# Patient Record
Sex: Male | Born: 1960 | Race: White | Hispanic: No | Marital: Married | Smoking: Former smoker
Health system: Southern US, Community
[De-identification: ages and names within clinical notes are randomized; demographics above are authoritative.]

## PROBLEM LIST (undated history)

## (undated) DIAGNOSIS — R011 Cardiac murmur, unspecified: Secondary | ICD-10-CM

## (undated) DIAGNOSIS — I4891 Unspecified atrial fibrillation: Secondary | ICD-10-CM

## (undated) DIAGNOSIS — I499 Cardiac arrhythmia, unspecified: Secondary | ICD-10-CM

## (undated) DIAGNOSIS — H269 Unspecified cataract: Secondary | ICD-10-CM

## (undated) DIAGNOSIS — H9190 Unspecified hearing loss, unspecified ear: Secondary | ICD-10-CM

## (undated) DIAGNOSIS — N400 Enlarged prostate without lower urinary tract symptoms: Secondary | ICD-10-CM

## (undated) DIAGNOSIS — M1990 Unspecified osteoarthritis, unspecified site: Secondary | ICD-10-CM

## (undated) DIAGNOSIS — T8859XA Other complications of anesthesia, initial encounter: Secondary | ICD-10-CM

## (undated) DIAGNOSIS — T4145XA Adverse effect of unspecified anesthetic, initial encounter: Secondary | ICD-10-CM

## (undated) DIAGNOSIS — E119 Type 2 diabetes mellitus without complications: Secondary | ICD-10-CM

## (undated) DIAGNOSIS — E785 Hyperlipidemia, unspecified: Secondary | ICD-10-CM

## (undated) DIAGNOSIS — E756 Lipid storage disorder, unspecified: Secondary | ICD-10-CM

## (undated) DIAGNOSIS — E8809 Other disorders of plasma-protein metabolism, not elsewhere classified: Secondary | ICD-10-CM

## (undated) DIAGNOSIS — Z974 Presence of external hearing-aid: Secondary | ICD-10-CM

## (undated) DIAGNOSIS — J45909 Unspecified asthma, uncomplicated: Secondary | ICD-10-CM

## (undated) DIAGNOSIS — Z5189 Encounter for other specified aftercare: Secondary | ICD-10-CM

## (undated) DIAGNOSIS — K219 Gastro-esophageal reflux disease without esophagitis: Secondary | ICD-10-CM

## (undated) DIAGNOSIS — Z87442 Personal history of urinary calculi: Secondary | ICD-10-CM

## (undated) DIAGNOSIS — I10 Essential (primary) hypertension: Secondary | ICD-10-CM

## (undated) DIAGNOSIS — G4730 Sleep apnea, unspecified: Secondary | ICD-10-CM

## (undated) DIAGNOSIS — D649 Anemia, unspecified: Secondary | ICD-10-CM

## (undated) DIAGNOSIS — G7100 Muscular dystrophy, unspecified: Secondary | ICD-10-CM

## (undated) DIAGNOSIS — T7840XA Allergy, unspecified, initial encounter: Secondary | ICD-10-CM

## (undated) DIAGNOSIS — R112 Nausea with vomiting, unspecified: Secondary | ICD-10-CM

## (undated) DIAGNOSIS — Z9889 Other specified postprocedural states: Secondary | ICD-10-CM

## (undated) HISTORY — DX: Unspecified cataract: H26.9

## (undated) HISTORY — DX: Other disorders of plasma-protein metabolism, not elsewhere classified: E88.09

## (undated) HISTORY — PX: CATARACT EXTRACTION: SUR2

## (undated) HISTORY — DX: Muscular dystrophy, unspecified: G71.00

## (undated) HISTORY — DX: Benign prostatic hyperplasia without lower urinary tract symptoms: N40.0

## (undated) HISTORY — PX: SHOULDER OPEN ROTATOR CUFF REPAIR: SHX2407

## (undated) HISTORY — DX: Encounter for other specified aftercare: Z51.89

## (undated) HISTORY — PX: FEMUR FRACTURE SURGERY: SHX633

## (undated) HISTORY — DX: Presence of external hearing-aid: Z97.4

## (undated) HISTORY — DX: Anemia, unspecified: D64.9

## (undated) HISTORY — DX: Unspecified asthma, uncomplicated: J45.909

## (undated) HISTORY — PX: KNEE SURGERY: SHX244

## (undated) HISTORY — DX: Cardiac arrhythmia, unspecified: I49.9

## (undated) HISTORY — DX: Gastro-esophageal reflux disease without esophagitis: K21.9

## (undated) HISTORY — PX: NISSEN FUNDOPLICATION: SHX2091

## (undated) HISTORY — PX: COLONOSCOPY: SHX174

## (undated) HISTORY — DX: Unspecified osteoarthritis, unspecified site: M19.90

## (undated) HISTORY — DX: Hyperlipidemia, unspecified: E78.5

## (undated) HISTORY — DX: Type 2 diabetes mellitus without complications: E11.9

## (undated) HISTORY — DX: Lipid storage disorder, unspecified: E75.6

## (undated) HISTORY — DX: Allergy, unspecified, initial encounter: T78.40XA

## (undated) HISTORY — PX: KIDNEY STONE SURGERY: SHX686

---
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
## 1999-01-01 ENCOUNTER — Ambulatory Visit (HOSPITAL_COMMUNITY): Admission: RE | Admit: 1999-01-01 | Discharge: 1999-01-01 | Payer: Self-pay | Admitting: Dentistry

## 1999-09-13 ENCOUNTER — Ambulatory Visit (HOSPITAL_COMMUNITY): Admission: RE | Admit: 1999-09-13 | Discharge: 1999-09-13 | Payer: Self-pay | Admitting: Cardiology

## 2002-03-18 ENCOUNTER — Ambulatory Visit (HOSPITAL_BASED_OUTPATIENT_CLINIC_OR_DEPARTMENT_OTHER): Admission: RE | Admit: 2002-03-18 | Discharge: 2002-03-19 | Payer: Self-pay | Admitting: *Deleted

## 2002-03-18 ENCOUNTER — Encounter (INDEPENDENT_AMBULATORY_CARE_PROVIDER_SITE_OTHER): Payer: Self-pay | Admitting: *Deleted

## 2002-08-10 ENCOUNTER — Ambulatory Visit: Admission: RE | Admit: 2002-08-10 | Discharge: 2002-08-10 | Payer: Self-pay

## 2002-08-11 ENCOUNTER — Encounter: Admission: RE | Admit: 2002-08-11 | Discharge: 2002-08-11 | Payer: Self-pay

## 2002-11-29 ENCOUNTER — Encounter: Admission: RE | Admit: 2002-11-29 | Discharge: 2002-11-29 | Payer: Self-pay

## 2002-12-16 ENCOUNTER — Encounter: Admission: RE | Admit: 2002-12-16 | Discharge: 2002-12-16 | Payer: Self-pay

## 2007-06-11 ENCOUNTER — Encounter: Admission: RE | Admit: 2007-06-11 | Discharge: 2007-06-11 | Payer: Self-pay

## 2008-02-13 ENCOUNTER — Emergency Department (HOSPITAL_COMMUNITY): Admission: EM | Admit: 2008-02-13 | Discharge: 2008-02-13 | Payer: Self-pay | Admitting: Emergency Medicine

## 2008-02-18 ENCOUNTER — Encounter: Admission: RE | Admit: 2008-02-18 | Discharge: 2008-02-18 | Payer: Self-pay | Admitting: Family Medicine

## 2008-09-15 ENCOUNTER — Ambulatory Visit: Payer: Self-pay | Admitting: Gastroenterology

## 2008-09-28 ENCOUNTER — Ambulatory Visit: Payer: Self-pay | Admitting: Gastroenterology

## 2009-03-03 ENCOUNTER — Ambulatory Visit (HOSPITAL_BASED_OUTPATIENT_CLINIC_OR_DEPARTMENT_OTHER): Admission: RE | Admit: 2009-03-03 | Discharge: 2009-03-03 | Payer: Self-pay | Admitting: Nurse Practitioner

## 2009-03-04 ENCOUNTER — Ambulatory Visit: Payer: Self-pay | Admitting: Internal Medicine

## 2009-03-30 ENCOUNTER — Observation Stay (HOSPITAL_COMMUNITY): Admission: EM | Admit: 2009-03-30 | Discharge: 2009-03-31 | Payer: Self-pay | Admitting: Emergency Medicine

## 2010-04-01 LAB — BASIC METABOLIC PANEL
BUN: 14 mg/dL (ref 6–23)
CO2: 25 mEq/L (ref 19–32)
Calcium: 8.7 mg/dL (ref 8.4–10.5)
Chloride: 107 mEq/L (ref 96–112)
Creatinine, Ser: 1.14 mg/dL (ref 0.4–1.5)
GFR calc Af Amer: >60 mL/min (ref >60)
GFR calc non Af Amer: >60 mL/min (ref >60)
Glucose, Bld: 90 mg/dL (ref 70–99)
Potassium: 3.5 mEq/L (ref 3.5–5.1)
Sodium: 138 mEq/L (ref 135–145)

## 2010-04-01 LAB — RAPID URINE DRUG SCREEN, HOSP PERFORMED
Amphetamines: NOT DETECTED
Barbiturates: NOT DETECTED
Benzodiazepines: NOT DETECTED
Cocaine: NOT DETECTED
Opiates: NOT DETECTED
Tetrahydrocannabinol: NOT DETECTED

## 2010-04-01 LAB — DIFFERENTIAL
Basophils Absolute: 0 10*3/uL (ref 0.0–0.1)
Basophils Relative: 0 % (ref 0–1)
Eosinophils Absolute: 0.4 10*3/uL (ref 0.0–0.7)
Eosinophils Relative: 6 % — ABNORMAL HIGH (ref 0–5)
Lymphocytes Relative: 15 % (ref 12–46)
Lymphs Abs: 0.9 10*3/uL (ref 0.7–4.0)
Monocytes Absolute: 0.5 10*3/uL (ref 0.1–1.0)
Monocytes Relative: 8 % (ref 3–12)
Neutro Abs: 4.3 10*3/uL (ref 1.7–7.7)
Neutrophils Relative %: 71 % (ref 43–77)

## 2010-04-01 LAB — COMPREHENSIVE METABOLIC PANEL
ALT: 28 U/L (ref 0–53)
AST: 31 U/L (ref 0–37)
Albumin: 4.2 g/dL (ref 3.5–5.2)
Alkaline Phosphatase: 99 U/L (ref 39–117)
BUN: 10 mg/dL (ref 6–23)
CO2: 25 mEq/L (ref 19–32)
Calcium: 9.4 mg/dL (ref 8.4–10.5)
Chloride: 106 mEq/L (ref 96–112)
Creatinine, Ser: 1.04 mg/dL (ref 0.4–1.5)
GFR calc Af Amer: >60 mL/min (ref >60)
GFR calc non Af Amer: >60 mL/min (ref >60)
Glucose, Bld: 85 mg/dL (ref 70–99)
Potassium: 3.9 mEq/L (ref 3.5–5.1)
Sodium: 139 mEq/L (ref 135–145)
Total Bilirubin: 0.8 mg/dL (ref 0.3–1.2)
Total Protein: 7.6 g/dL (ref 6.0–8.3)

## 2010-04-01 LAB — GLUCOSE, CAPILLARY
Glucose-Capillary: 118 mg/dL — ABNORMAL HIGH (ref 70–99)
Glucose-Capillary: 176 mg/dL — ABNORMAL HIGH (ref 70–99)

## 2010-04-01 LAB — CBC
HCT: 45.8 % (ref 39.0–52.0)
Hemoglobin: 15.6 g/dL (ref 13.0–17.0)
MCHC: 34.1 g/dL (ref 30.0–36.0)
MCV: 92.6 fL (ref 78.0–100.0)
Platelets: 258 10*3/uL (ref 150–400)
RBC: 4.95 MIL/uL (ref 4.22–5.81)
RDW: 13.1 % (ref 11.5–15.5)
WBC: 6.1 10*3/uL (ref 4.0–10.5)

## 2010-04-01 LAB — CARDIAC PANEL(CRET KIN+CKTOT+MB+TROPI)
CK, MB: 2.7 ng/mL (ref 0.3–4.0)
CK, MB: 3.1 ng/mL (ref 0.3–4.0)
Relative Index: 1.7 (ref 0.0–2.5)
Relative Index: 1.8 (ref 0.0–2.5)
Total CK: 151 U/L (ref 7–232)
Total CK: 178 U/L (ref 7–232)
Troponin I: 0.01 ng/mL (ref 0.00–0.06)
Troponin I: 0.01 ng/mL (ref 0.00–0.06)

## 2010-04-01 LAB — TSH: TSH: 0.523 u[IU]/mL (ref 0.350–4.500)

## 2010-04-01 LAB — POCT CARDIAC MARKERS
CKMB, poc: 1.7 ng/mL (ref 1.0–8.0)
Myoglobin, poc: 93.9 ng/mL (ref 12–200)
Troponin i, poc: <0.05 ng/mL (ref 0.00–0.09)

## 2010-04-01 LAB — CK TOTAL AND CKMB (NOT AT ARMC)
CK, MB: 4.2 ng/mL — ABNORMAL HIGH (ref 0.3–4.0)
Relative Index: 1.6 (ref 0.0–2.5)
Total CK: 256 U/L — ABNORMAL HIGH (ref 7–232)

## 2010-04-01 LAB — LIPID PANEL
Cholesterol: 200 mg/dL (ref 0–200)
HDL: 42 mg/dL (ref >39)
LDL Cholesterol: 138 mg/dL — ABNORMAL HIGH (ref 0–99)
Total CHOL/HDL Ratio: 4.8 RATIO
Triglycerides: 99 mg/dL (ref <150)
VLDL: 20 mg/dL (ref 0–40)

## 2010-04-01 LAB — TROPONIN I: Troponin I: 0.02 ng/mL (ref 0.00–0.06)

## 2010-04-01 LAB — HEMOGLOBIN A1C
Hgb A1c MFr Bld: 5.4 % (ref 4.6–6.1)
Mean Plasma Glucose: 108 mg/dL

## 2010-04-01 LAB — D-DIMER, QUANTITATIVE: D-Dimer, Quant: <0.22 ug/mL-FEU (ref 0.00–0.48)

## 2010-05-24 NOTE — Op Note (Signed)
NAME:  Johnny Mathews, Johnny Mathews NO.:  [id number]   MEDICAL RECORD NO.:  [id number]                   PATIENT TYPE:  AMB   LOCATION:  DSC                                  FACILITY:  [facility]   PHYSICIAN:  Kathy Breach, M.D.                   DATE OF BIRTH:  March 20, 1960   DATE OF PROCEDURE:  DATE OF DISCHARGE:                                 OPERATIVE REPORT   PREOPERATIVE DIAGNOSIS:  Clinical otosclerosis, AS.   PROCEDURE:  Stapedectomy with insertion of 4.0 mm fat wire prosthesis.   POSTOPERATIVE DIAGNOSIS:  Clinical otosclerosis, AS.   DESCRIPTION OF PROCEDURE:  The patient underwent orotracheal anesthesia and  the left ear was prepped and draped in the usual sterile fashion.  The canal  skin was infiltrated with 1% Xylocaine with 1:100,000 epinephrine as well as  skin free margins, skin of the left earlobe for vasoconstriction.  Stab  incision was made free margin left earlobe and small fat globule harvested  for construction of fat wire prosthesis.  The patient had a fairly small and  very curved ear canal.  Only visualization of the posterior superior 1/3 of  the tympanic membrane which was normal in appearance.  Tympanotomy flap was  incised from 6 o'clock inferiorly and 12 o'clock superiorly and elevated,  entering the middle ear space and turning flap forward exposing the  posterior superior tympanic middle ear space.  Immediately visible was the  long process incus stapes trapezius tendon and complete visualization of the  oval window area.  Manipulation of the long process of the malleus showed  movement of the incus with no movement of the stapes superstructure.  Core  tympany remained in position anterior and superior to the operative field  and no bony curettement was required.  The incus trapezius joint was then  separated. The trapezius tendon was severed.  Superstructure was  downfractured and removed.  It had a blue footplate with whitish  thickening  at the posterior sulcus apparent.  Measurement from the footplate to the  incus was 4 mm.  The mucosal margins of the oval window were dissected back  with a 90 degree chisel.  The 4 mm fat wire prosthesis was then constructed.  Midportion of the blue footplate was then fractured with a straight pick.  The footplate was delivered anterior 1/3 with 30 mm 45 degree pick and then  the posterior 2/3 readily extracted. The fat wire prosthesis was inserted  into the open oval window and [place] Crook placed over the long process  of the incus and crimped in position.  Manipulation of the long process of  the malleus showed normally mobile reconstructed vesicular chain.  Tympanotomy flap returned back in position and stabilized.  Packing with  Gelfoam pledgets soaked in Ciprodex Otic suspension filling the bony  external canal. Sterile cotton was placed in the external meatus. Bacitracin  ointment was applied to the donor incision site of the ear lobe. The patient  tolerated the procedure well and was taken to the recovery room in stable  general condition.                                               Kathy Breach, M.D.    Venia Minks  D:  03/18/2002  T:  03/18/2002  Job:  [id number]

## 2010-05-24 NOTE — Op Note (Signed)
[place]  Patient:    Johnny Mathews                       MRN: [id number] Proc. Date: 01/01/99 Adm. Date:  [id number] Disc. Date: [id number] Attending:  Mohorn, Rachit Grim                           Operative Report  PREOPERATIVE DIAGNOSIS:  Multiple decayed and necrotic teeth.  POSTOPERATIVE DIAGNOSIS:  Multiple decayed and necrotic teeth.  PROCEDURE:  Extraction of teeth #1, 4, 5, 19, K.  SURGEON:  DCherly Anderson, D.D.S.  ANESTHESIA:  MAC anesthesia.  INDICATIONS:   [name] has a history of asthma, which is controlled moderately well.  He desired sedation and due to his active asthma, he was not a candidate to have the sedation performed in the office, therefore, he was scheduled for MAC anesthesia in the main OR at [facility].  DESCRIPTION OF PROCEDURE:  Patient was brought to the operating room and placed in a supine position.  Once a adequate level of sedation was established, local anesthesia was injected consisting of 2% lidocaine with epinephrine.  Once local anesthesia was obtained, a sulcular incision was made around the following teeth: #1, 4, 5, 19 and K.  Teeth #1, 4 and 5 were removed without complications. Teeth #19 and K were sectioned and removed without complications.  All sites were irrigated normal saline and the gingiva was closed with 4-0 chromic sutures. The patient was allowed to waken and transferred to recovery room in stable and satisfactory condition. DD:  02/05/99 TD:  02/05/99 Job: [id number]

## 2012-03-02 ENCOUNTER — Encounter: Payer: Self-pay | Admitting: Sports Medicine

## 2012-03-02 ENCOUNTER — Ambulatory Visit (INDEPENDENT_AMBULATORY_CARE_PROVIDER_SITE_OTHER): Payer: BC Managed Care – PPO | Admitting: Sports Medicine

## 2012-03-02 VITALS — BP 148/87 | HR 71 | Ht 69.0 in | Wt 180.0 lb

## 2012-03-02 DIAGNOSIS — M25519 Pain in unspecified shoulder: Secondary | ICD-10-CM

## 2012-03-02 DIAGNOSIS — M25511 Pain in right shoulder: Secondary | ICD-10-CM | POA: Insufficient documentation

## 2012-03-02 MED ORDER — NITROGLYCERIN 0.2 MG/HR TD PT24: MEDICATED_PATCH | TRANSDERMAL | Status: DC

## 2012-03-02 NOTE — Assessment & Plan Note (Addendum)
Likely due to muscle imbalance.  Will give home strengthening exercises to improve scapular stability and maintain RC strength  Classic impingement position but no sign of tear  He does have probable tendinopathy with the hypoechoic changes notes so:  Try nitroglycerin x6 weeks.  F/u 6 weeks

## 2012-03-02 NOTE — Progress Notes (Signed)
52 yo M here for new patient visit for shoulder pain.  He reports right shoulder pain for approximately 1 year that has been gradually worsening to the point where he has significant difficulty working out.  He cannot think of any injury that started this.  He feels a grinding in his shoulder.  He does not feel like it it weak, just painful-- 2-3/10 at rest, increases to 5/10 with movement- especially overhead movements.  Pain is achy, over his lateral upper arm.  He is unable to sleep on his right side due to pain.  No night time pain if he is not laying on the arm.  No neck pain. No radiating pain or numbness.  Does have history of surgery on LEFT shoulder (? 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  But does have BILATERAL anterior and internal rotation position of shoulders with protraction of scapulae Palpation is normal with no tenderness over AC joint or bicipital groove. + crepitus ROM is full in all planes.  Rotator cuff strength normal throughout without pain No signs of impingement with negative Neer, Hawkin's, empty can tests.  Speeds test normal.  No labral pathology noted with negative Obrien's, negative clunk and good stability.  Normal scapular function observed.  No painful arc and no drop arm sign.  Full neck ROM   Ultrasound: Teres minor appears thin, somewhat atrophied as does infrasponatus Some hypoechoic change noted Deltoid with scar tissue in  posterior shoulder Hypoechoic change and increased blood flow in subscapularis Supraspinatus was normal No labral tear appreciated

## 2012-03-02 NOTE — Patient Instructions (Addendum)
Do the exercises that we showed you every day 2-3 times if you are able.  Use the nitroglycerin patch on the area.  Use 1/4 patch every day, then change the patch.    Come back in 6 weeks for follow up.   Nitroglycerin Protocol   Apply 1/4 nitroglycerin patch to affected area daily.  Change position of patch within the affected area every 24 hours.  You may experience a headache during the first 1-2 weeks of using the patch, these should subside.  If you experience headaches after beginning nitroglycerin patch treatment, you may take your preferred over the counter pain reliever.  Another side effect of the nitroglycerin patch is skin irritation or rash related to patch adhesive.  Please notify our office if you develop more severe headaches or rash, and stop the patch.  Tendon healing with nitroglycerin patch may require 12 to 24 weeks depending on the extent of injury.  Men should not use if taking Viagra, Cialis, or Levitra.   Do not use if you have migraines or rosacea.

## 2012-03-08 ENCOUNTER — Encounter: Payer: Self-pay | Admitting: Sports Medicine

## 2012-03-10 ENCOUNTER — Telehealth: Payer: Self-pay | Admitting: *Deleted

## 2012-03-10 NOTE — Telephone Encounter (Signed)
Advised pt that per Dr. Darrick Penna- he could try a figure 8 brace to help with his shoulder posture and pain that he has when he is using his shoulder a lot.    Message from patient: I was wondering if there was any type of shoulder brace I could use when I was doing strenuous work using my right shoulder. When I start having to use this shoulder, I feel like it gets worse. This is not the way I want it to go, I want it to get better, so I was looking for something to prevent, from making it worse when I am having to lift, and pull stuff.

## 2012-03-25 ENCOUNTER — Telehealth: Payer: Self-pay | Admitting: Physician Assistant

## 2012-03-25 NOTE — Telephone Encounter (Signed)
Wants to see maier as soon as possible for sinus and athasam

## 2012-03-26 ENCOUNTER — Ambulatory Visit (INDEPENDENT_AMBULATORY_CARE_PROVIDER_SITE_OTHER): Payer: BC Managed Care – PPO | Admitting: Physician Assistant

## 2012-03-26 ENCOUNTER — Encounter: Payer: Self-pay | Admitting: Physician Assistant

## 2012-03-26 VITALS — BP 130/73 | HR 70 | Temp 97.7°F | Ht 69.0 in | Wt 190.4 lb

## 2012-03-26 DIAGNOSIS — R05 Cough: Secondary | ICD-10-CM

## 2012-03-26 DIAGNOSIS — R059 Cough, unspecified: Secondary | ICD-10-CM

## 2012-03-26 DIAGNOSIS — J329 Chronic sinusitis, unspecified: Secondary | ICD-10-CM

## 2012-03-26 MED ORDER — HYDROCODONE-HOMATROPINE 5-1.5 MG/5ML PO SYRP: 10.0000 mL | ORAL_SOLUTION | Freq: Every evening | ORAL | Status: DC | PRN

## 2012-03-26 MED ORDER — AMOXICILLIN 875 MG PO TABS: 875.0000 mg | ORAL_TABLET | Freq: Two times a day (BID) | ORAL | Status: DC

## 2012-03-26 NOTE — Telephone Encounter (Signed)
Patient aware of appt

## 2012-03-26 NOTE — Progress Notes (Signed)
  Subjective:    Patient ID: Johnny Mathews, male    DOB: Jan 13, 1960, 52 y.o.   MRN: [id number]  HPI 2 weeks,    Review of Systems  Constitutional: Positive for chills and fatigue.  HENT: Positive for congestion, rhinorrhea, postnasal drip and sinus pressure.   Eyes: Positive for discharge and itching.       Glassy eyed appearance       Objective:   Physical Exam  HENT:  Head: Normocephalic and atraumatic.  Right Ear: External ear normal.  Left Ear: External ear normal.  Glassy eyed appearance, maxofacial tenderness, b/l nasal hypertrophy, pharynx injected  Neck: Normal range of motion. Neck supple.  Cardiovascular: Normal rate, regular rhythm and normal heart sounds.   Pulmonary/Chest:  Night cough/bronchospasm          Assessment & Plan:  Sinusitis Cough No orders of the defined types were placed in this encounter.   Meds ordered this encounter  Medications  . amoxicillin (AMOXIL) 875 MG tablet    Sig: Take 1 tablet (875 mg total) by mouth 2 (two) times daily.    Dispense:  20 tablet    Refill:  0    Order Specific Question:  Supervising Provider    Answer:  Ernestina Penna [[id number]]  . HYDROcodone-homatropine (HYCODAN) 5-1.5 MG/5ML syrup    Sig: Take 10 mLs by mouth at bedtime as needed for cough (2 tsp qhs only).    Dispense:  120 mL    Refill:  0    Order Specific Question:  Supervising Provider    Answer:  Ernestina Penna [phone]

## 2012-04-13 ENCOUNTER — Ambulatory Visit (INDEPENDENT_AMBULATORY_CARE_PROVIDER_SITE_OTHER): Payer: BC Managed Care – PPO | Admitting: Nurse Practitioner

## 2012-04-13 ENCOUNTER — Telehealth: Payer: Self-pay | Admitting: Nurse Practitioner

## 2012-04-13 VITALS — BP 148/88 | HR 63 | Temp 99.4°F | Ht 69.0 in | Wt 189.0 lb

## 2012-04-13 DIAGNOSIS — J209 Acute bronchitis, unspecified: Secondary | ICD-10-CM

## 2012-04-13 MED ORDER — AMOXICILLIN 875 MG PO TABS: 875.0000 mg | ORAL_TABLET | Freq: Two times a day (BID) | ORAL | Status: DC

## 2012-04-13 NOTE — Telephone Encounter (Signed)
APPT MADE

## 2012-04-13 NOTE — Progress Notes (Signed)
  Subjective:    Patient ID: Johnny Mathews, male    DOB: 1960-10-29, 52 y.o.   MRN: [id number]  HPI-Patient in complaining of cough and congestiob  . Started 5day. Has gotten worse since started. Associated symptoms include Wheezing and has been having to use his albuterol inhaler more than usual. He has tried robitussin OTC without relief     Review of Systems  Constitutional: Positive for fatigue. Negative for fever and chills.  HENT: Positive for congestion, rhinorrhea and sinus pressure.   Respiratory: Positive for cough (nonproductive) and wheezing.   Cardiovascular: Negative.   Gastrointestinal: Negative.   Neurological: Positive for dizziness (light).  Psychiatric/Behavioral: Negative.        Objective:   Physical Exam  Constitutional: He is oriented to person, place, and time. He appears well-developed and well-nourished.  HENT:  Head: Normocephalic.  Right Ear: Tympanic membrane is not erythematous. No middle ear effusion.  Left Ear: Tympanic membrane is not erythematous.  No middle ear effusion.  Nose: Mucosal edema and rhinorrhea present.  Mouth/Throat: Oropharynx is clear and moist and mucous membranes are normal.  Cardiovascular: Normal rate.   Regular irregularity  Pulmonary/Chest: Effort normal. He has wheezes (faint isp bil bases).  Abdominal: Soft. Bowel sounds are normal.  Neurological: He is alert and oriented to person, place, and time.  Skin: Skin is warm and dry.  Psychiatric: He has a normal mood and affect.   BP 148/88  Pulse 63  Temp(Src) 99.4 F (37.4 C) (Oral)  Ht 5\' 9"  (1.753 m)  Wt 189 lb (85.73 kg)  BMI 27.9 kg/m2        Assessment & Plan:  1. Acute bronchitis 1. Take meds as prescribed 2. Use a cool mist humidifier especially during the winter months and when heat has  been humid. 3. Use saline nose sprays frequently 4. Saline irrigations of the nose can be very helpful if done frequently.  * 4X daily for 1 week*  * Use of a nettie  pot can be helpful with this. Follow directions with this* 5. Drink plenty of fluids 6. Keep thermostat turn down low 7.For any cough or congestion  Use plain Mucinex- regular strength or max strength is fine   * Children- consult with Pharmacist for dosing 8. For fever or aces or pains- take tylenol or ibuprofen appropriate for age and weight.  * for fevers greater than 101 orally you may alternate ibuprofen and tylenol every  3 hours.   Norel AD 1 PO q6 prn cough and congestion - amoxicillin (AMOXIL) 875 MG tablet; Take 1 tablet (875 mg total) by mouth 2 (two) times daily.  Dispense: 20 tablet; Refill: 0 Mary-Margaret Daphine Deutscher, FNP

## 2012-04-13 NOTE — Patient Instructions (Signed)

## 2012-04-14 ENCOUNTER — Ambulatory Visit: Payer: BC Managed Care – PPO | Admitting: General Practice

## 2012-04-16 ENCOUNTER — Telehealth: Payer: Self-pay | Admitting: Nurse Practitioner

## 2012-04-16 NOTE — Telephone Encounter (Signed)
PT AWARE  

## 2012-04-16 NOTE — Telephone Encounter (Signed)
Only been on antibiotics for 3 days. What symptoms does he still HAve

## 2012-04-20 ENCOUNTER — Ambulatory Visit: Payer: BC Managed Care – PPO | Admitting: Sports Medicine

## 2012-04-21 ENCOUNTER — Ambulatory Visit (INDEPENDENT_AMBULATORY_CARE_PROVIDER_SITE_OTHER): Payer: BC Managed Care – PPO | Admitting: General Practice

## 2012-04-21 ENCOUNTER — Ambulatory Visit: Payer: BC Managed Care – PPO | Admitting: General Practice

## 2012-04-21 ENCOUNTER — Emergency Department (HOSPITAL_COMMUNITY): Payer: BC Managed Care – PPO

## 2012-04-21 ENCOUNTER — Encounter (HOSPITAL_COMMUNITY): Payer: Self-pay | Admitting: Emergency Medicine

## 2012-04-21 ENCOUNTER — Emergency Department (HOSPITAL_COMMUNITY)
Admission: EM | Admit: 2012-04-21 | Discharge: 2012-04-21 | Disposition: A | Discharge status: Home / Self Care | Payer: BC Managed Care – PPO | Attending: Emergency Medicine | Admitting: Emergency Medicine

## 2012-04-21 VITALS — BP 108/71 | HR 69 | Temp 98.4°F | Ht 69.0 in | Wt 190.0 lb

## 2012-04-21 DIAGNOSIS — E785 Hyperlipidemia, unspecified: Secondary | ICD-10-CM | POA: Insufficient documentation

## 2012-04-21 DIAGNOSIS — IMO0002 Reserved for concepts with insufficient information to code with codable children: Secondary | ICD-10-CM | POA: Insufficient documentation

## 2012-04-21 DIAGNOSIS — W010XXA Fall on same level from slipping, tripping and stumbling without subsequent striking against object, initial encounter: Secondary | ICD-10-CM | POA: Insufficient documentation

## 2012-04-21 DIAGNOSIS — Z87891 Personal history of nicotine dependence: Secondary | ICD-10-CM | POA: Insufficient documentation

## 2012-04-21 DIAGNOSIS — Y9229 Other specified public building as the place of occurrence of the external cause: Secondary | ICD-10-CM | POA: Insufficient documentation

## 2012-04-21 DIAGNOSIS — Z79899 Other long term (current) drug therapy: Secondary | ICD-10-CM | POA: Insufficient documentation

## 2012-04-21 DIAGNOSIS — M62830 Muscle spasm of back: Secondary | ICD-10-CM

## 2012-04-21 DIAGNOSIS — E119 Type 2 diabetes mellitus without complications: Secondary | ICD-10-CM

## 2012-04-21 DIAGNOSIS — Y9389 Activity, other specified: Secondary | ICD-10-CM | POA: Insufficient documentation

## 2012-04-21 DIAGNOSIS — W108XXA Fall (on) (from) other stairs and steps, initial encounter: Secondary | ICD-10-CM | POA: Insufficient documentation

## 2012-04-21 DIAGNOSIS — M5380 Other specified dorsopathies, site unspecified: Secondary | ICD-10-CM | POA: Insufficient documentation

## 2012-04-21 DIAGNOSIS — M549 Dorsalgia, unspecified: Secondary | ICD-10-CM

## 2012-04-21 DIAGNOSIS — J45909 Unspecified asthma, uncomplicated: Secondary | ICD-10-CM | POA: Insufficient documentation

## 2012-04-21 MED ORDER — MORPHINE SULFATE 4 MG/ML IJ SOLN
4.0000 mg | Freq: Once | INTRAMUSCULAR | Status: AC
  Administered 2012-04-21: 4 mg via INTRAVENOUS
  Filled 2012-04-21: qty 1

## 2012-04-21 MED ORDER — OXYCODONE-ACETAMINOPHEN 5-325 MG PO TABS: 1.0000 | ORAL_TABLET | ORAL | Status: DC | PRN

## 2012-04-21 MED ORDER — METHOCARBAMOL 500 MG PO TABS: 500.0000 mg | ORAL_TABLET | Freq: Two times a day (BID) | ORAL | Status: DC

## 2012-04-21 NOTE — ED Notes (Signed)
Pt returned from radiology.

## 2012-04-21 NOTE — ED Provider Notes (Signed)
Medical screening examination/treatment/procedure(s) were performed by non-physician practitioner and as supervising physician I was immediately available for consultation/collaboration.   Zakari Garlon Tuggle, MD 04/21/12 2017 

## 2012-04-21 NOTE — Patient Instructions (Signed)
Back Pain, Adult Low back pain is very common. About 1 in 5 people have back pain.The cause of low back pain is rarely dangerous. The pain often gets better over time.About half of people with a sudden onset of back pain feel better in just 2 weeks. About 8 in 10 people feel better by 6 weeks.  CAUSES Some common causes of back pain include:  Strain of the muscles or ligaments supporting the spine.  Wear and tear (degeneration) of the spinal discs.  Arthritis.  Direct injury to the back. DIAGNOSIS Most of the time, the direct cause of low back pain is not known.However, back pain can be treated effectively even when the exact cause of the pain is unknown.Answering your caregiver's questions about your overall health and symptoms is one of the most accurate ways to make sure the cause of your pain is not dangerous. If your caregiver needs more information, he or she may order lab work or imaging tests (X-rays or MRIs).However, even if imaging tests show changes in your back, this usually does not require surgery. HOME CARE INSTRUCTIONS For many people, back pain returns.Since low back pain is rarely dangerous, it is often a condition that people can learn to manageon their own.   Remain active. It is stressful on the back to sit or stand in one place. Do not sit, drive, or stand in one place for more than 30 minutes at a time. Take short walks on level surfaces as soon as pain allows.Try to increase the length of time you walk each day.  Do not stay in bed.Resting more than 1 or 2 days can delay your recovery.  Do not avoid exercise or work.Your body is made to move.It is not dangerous to be active, even though your back may hurt.Your back will likely heal faster if you return to being active before your pain is gone.  Pay attention to your body when you bend and lift. Many people have less discomfortwhen lifting if they bend their knees, keep the load close to their bodies,and  avoid twisting. Often, the most comfortable positions are those that put less stress on your recovering back.  Find a comfortable position to sleep. Use a firm mattress and lie on your side with your knees slightly bent. If you lie on your back, put a pillow under your knees.  Only take over-the-counter or prescription medicines as directed by your caregiver. Over-the-counter medicines to reduce pain and inflammation are often the most helpful.Your caregiver may prescribe muscle relaxant drugs.These medicines help dull your pain so you can more quickly return to your normal activities and healthy exercise.  Put ice on the injured area.  Put ice in a plastic bag.  Place a towel between your skin and the bag.  Leave the ice on for 15 to 20 minutes, 3 to 4 times a day for the first 2 to 3 days. After that, ice and heat may be alternated to reduce pain and spasms.  Ask your caregiver about trying back exercises and gentle massage. This may be of some benefit.  Avoid feeling anxious or stressed.Stress increases muscle tension and can worsen back pain.It is important to recognize when you are anxious or stressed and learn ways to manage it.Exercise is a great option. SEEK MEDICAL CARE IF:  You have pain that is not relieved with rest or medicine.  You have pain that does not improve in 1 week.  You have new symptoms.  You are generally   not feeling well. SEEK IMMEDIATE MEDICAL CARE IF:   You have pain that radiates from your back into your legs.  You develop new bowel or bladder control problems.  You have unusual weakness or numbness in your arms or legs.  You develop nausea or vomiting.  You develop abdominal pain.  You feel faint. Document Released: 12/23/2004 Document Revised: 06/24/2011 Document Reviewed: 05/13/2010 Exit[employer] Patient Information 2013 [employer].  

## 2012-04-21 NOTE — Progress Notes (Signed)
Patient ID: Johnny Mathews, male   DOB: 1960-06-13, 52 y.o.   MRN: [id number]  Patient presented today with right side low back pain. Patient was registered at the front desk, vital signs obtained and placed in an exam room. Patient began to yell, nurse entered room and patient verbalized the he was having pain, felt dizzy and was going to pass out. Patient laid on floor of exam room in prone position on white paper lap cover. Patient's right flank area increasingly grew larger. 911 was called to transport patient to the emergency room. Patient drove himself to doctor office. Patient remained alert and oriented times three throughout visit here. Upon EMS arrival the patient was alert and oriented, able to move all extremities times 4. Patient was placed on stretcher in prone position by the EMS staff and transported to [facility] hospital.  His wife was notified that patient was being transported to the hospital.

## 2012-04-21 NOTE — ED Notes (Signed)
Per EMS - pt c/o right lower back pain that started after he fell down a few steps this morning off of his porch. Pt didn't hit head, denies LOC. Pt got up and went to work, while at work pain increasingly worsened. Pt left work and went to [facility] to be evaluated, while he was there they had him sitting up and he felt like he was going to pass out and his back pain was worse. Staff at [facility] report that there was swelling to his right lower back about the size of an orange, by the time EMS arrived the swelling and increased dramatically. EMS started a 20G in left forearm. BP 148/90 HR 60-70s RR 24 O2 sats 98% on room air. Pt able to move all extremities prior to EMS arrival and after their arrival. Pt most comfortable laying on stomach.

## 2012-04-21 NOTE — ED Notes (Signed)
Pt reports there was ice on the steps when he slipped and fell. Pt reports he landed on his back onto a step. Pt denies hitting/LOC. Pt reports he was able to get up and go to work from there but at the morning went on the pain worsened. He went to PCP and they called EMS. Pt in nad, skin warm and dry, resp e/u. Pt has notable swelling to right lower back.

## 2012-04-21 NOTE — ED Provider Notes (Signed)
History     CSN: [id number]  Arrival date & time 04/21/12  1203   First MD Initiated Contact with Patient 04/21/12 1213      Chief Complaint  Patient presents with  . Back Pain    (Consider location/radiation/quality/duration/timing/severity/associated sxs/prior treatment) HPI Comments: Patient presents to the ED via EMS from PCP office after a fall this morning and has steps. Reports he missed a step, slipped, and fell down the stairs.  Reports he landed on his buttocks and lower back. Denies head trauma or loss of consciousness. Patient went to work as normal but after moving around pain became more intense.  Pain is described as a tightness localized to his right lower back, improved by lying on stomach and exacerbated by standing upright or walking. Denies any numbness or paresthesias of lower extremities. Sensation intact. No loss of bowel or bladder function.  Patient has history of herniated discs of the lumbar spine which were not from a prior back injury.    The history is provided by the patient.    Past Medical History  Diagnosis Date  . Diabetes mellitus without complication   . Asthma   . Hyperlipidemia   . Muscular dystrophy     Past Surgical History  Procedure Laterality Date  . Shoulder open rotator cuff repair    . Knee surgery Right   . Kidney stone surgery      Family History  Problem Relation Age of Onset  . Cancer Mother   . Heart disease Father   . Cancer Sister     History  Substance Use Topics  . Smoking status: Former Smoker    Quit date: 01/06/1997  . Smokeless tobacco: Never Used  . Alcohol Use: Yes     Comment: social      Review of Systems  Musculoskeletal: Positive for back pain.  All other systems reviewed and are negative.    Allergies  Ivp dye; Eggs or egg-derived products; Septra; and Sulfonamide derivatives  Home Medications   Current Outpatient Rx  Name  Route  Sig  Dispense  Refill  . amitriptyline (ELAVIL) 25 MG  tablet   Oral   Take 75 mg by mouth at bedtime.         Marland Kitchen amoxicillin (AMOXIL) 875 MG tablet   Oral   Take 1 tablet (875 mg total) by mouth 2 (two) times daily.   20 tablet   0   . azelastine (ASTELIN) 137 MCG/SPRAY nasal spray   Nasal   Place into the nose at bedtime.         Marland Kitchen BYDUREON 2 MG SUSR   Subcutaneous   Inject 2 mg into the skin once a week.         Marland Kitchen EPINEPHrine (EPI-PEN) 0.3 mg/0.3 mL DEVI      as needed.         . fluticasone (FLONASE) 50 MCG/ACT nasal spray   Nasal   Place into the nose 2 (two) times daily.         . mometasone-formoterol (DULERA) 100-5 MCG/ACT AERO   Inhalation   Inhale 1 puff into the lungs 2 (two) times daily.         . montelukast (SINGULAIR) 10 MG tablet   Oral   Take 10 mg by mouth daily.         Marland Kitchen NEXIUM 40 MG capsule   Oral   Take 40 mg by mouth daily.         Marland Kitchen  nitroGLYCERIN (NITRODUR - DOSED IN MG/24 HR) 0.2 mg/hr      Apply 1/4 patch to affected area daily.  Change patch every 24 hours   30 patch   1   . pravastatin (PRAVACHOL) 40 MG tablet   Oral   Take 40 mg by mouth daily.         Marland Kitchen PROAIR HFA 108 (90 BASE) MCG/ACT inhaler   Inhalation   Inhale into the lungs as needed.           BP 144/78  Pulse 61  Temp(Src) 98.1 F (36.7 C) (Oral)  Resp 14  SpO2 100%  Physical Exam  Nursing note and vitals reviewed. Constitutional: He is oriented to person, place, and time. He appears well-developed and well-nourished.  HENT:  Head: Normocephalic and atraumatic.  Mouth/Throat: Oropharynx is clear and moist.  Eyes: Conjunctivae and EOM are normal. Pupils are equal, round, and reactive to light.  Neck: Normal range of motion.  Cardiovascular: Normal rate, regular rhythm and normal heart sounds.   Pulmonary/Chest: Effort normal and breath sounds normal.  Abdominal: Soft. Bowel sounds are normal.  Musculoskeletal: Normal range of motion.       Lumbar back: He exhibits tenderness, swelling, pain  and spasm. He exhibits normal range of motion, no bony tenderness, no edema, no deformity, no laceration and normal pulse.  Muscle spasm, swelling, and TTP of right lower back; full ROM of LE, normal sensation, strong distal pulses and cap refill  Neurological: He is alert and oriented to person, place, and time. He has normal strength. No cranial nerve deficit or sensory deficit. Gait normal.  Skin: Skin is warm and dry.  Psychiatric: He has a normal mood and affect.    ED Course  Procedures (including critical care time)  Labs Reviewed - No data to display Dg Lumbar Spine Complete  04/21/2012  *RADIOLOGY REPORT*  Clinical Data: Larey Seat.  Right-sided back pain.  LUMBAR SPINE - COMPLETE 4+ VIEW  Comparison: None.  Findings: Five lumbar-type vertebral bodies show normal alignment. There is disc space narrowing at L5-S1.  Other disc space heights are normal.  No evidence of vertebral body fracture.  There are mild lower lumbar degenerative facet changes.  IMPRESSION: No identifiably acute or traumatic finding.  Disc space narrowing L5-S1.  Lower lumbar facet degeneration.   Original Report Authenticated By: Paulina Fusi, M.D.      1. Acute back pain   2. Muscle spasm of back       MDM   Patient presents to the emergency department after a fall down the front steps at his home this morning. Now complaining of low back pain localized to the right lower back. Full ROM of lower extremities, normal sensation, strong distal pulses.  No loss of bowel or bladder function.  Lumbar spine x-ray negative for acute fracture.  Muscle spasm and swelling present along right lower back.  Good relief of pain with IV morphine.  Patient was able to ambulate normally around the room prior to d/c without difficulty or pain.  Rx robaxin and percocet.  Patient will follow primary care physician Dr. Christell Constant if symptoms do not improve in the next few days.   Discussed plan with pt and wife- they both agreed.  Return  precautions advised.  Garlon Hatchet, PA-C 04/21/12 1751

## 2012-04-23 ENCOUNTER — Ambulatory Visit: Payer: BC Managed Care – PPO | Admitting: Sports Medicine

## 2012-04-28 ENCOUNTER — Telehealth: Payer: Self-pay | Admitting: Nurse Practitioner

## 2012-04-28 NOTE — Telephone Encounter (Signed)
APPT MADE FOR [facility]

## 2012-04-29 ENCOUNTER — Ambulatory Visit (INDEPENDENT_AMBULATORY_CARE_PROVIDER_SITE_OTHER): Payer: BC Managed Care – PPO | Admitting: Emergency Medicine

## 2012-04-29 VITALS — BP 152/84 | HR 62 | Temp 98.2°F | Resp 16 | Ht 69.0 in | Wt 190.0 lb

## 2012-04-29 DIAGNOSIS — M545 Low back pain, unspecified: Secondary | ICD-10-CM

## 2012-04-29 DIAGNOSIS — G7100 Muscular dystrophy, unspecified: Secondary | ICD-10-CM

## 2012-04-29 MED ORDER — CYCLOBENZAPRINE HCL 10 MG PO TABS: ORAL_TABLET | ORAL | Status: DC

## 2012-04-29 MED ORDER — MELOXICAM 7.5 MG PO TABS: ORAL_TABLET | ORAL | Status: DC

## 2012-04-29 NOTE — Patient Instructions (Signed)
You have a large hematoma to your lower back. Take the Mobic during the day and Flexeril at night. You can use ice or heat, whichever makes it feel better. We have written a note for restrictions at work. Try to rest your back as best you can.

## 2012-04-29 NOTE — Progress Notes (Signed)
  Subjective:    Patient ID: Johnny Mathews, male    DOB: 28-Aug-1960, 52 y.o.   MRN: [id number]  HPI 52 yo male who slipped on frost and fell 8 days ago and hit back on bottom step. Went to hospital that day. Took Xrays, said nothing was broken. Gave him percocet that he cannot tolerate, and methocarbamol, which he thinks is not helping. Hurts all across lower back. Feels a knot on R lower back, where pain is the worst. Bruising wraps around R flank. Pain has not improved. Has not been sleeping due to pain.  No radicular symptoms. No incontinence. No saddle anesthesia.    Review of Systems  Constitutional: Negative for fever and chills.  Musculoskeletal: Positive for back pain. Negative for gait problem.       Objective:   Physical Exam  Constitutional: He is oriented to person, place, and time. He appears well-developed and well-nourished.  HENT:  Head: Normocephalic and atraumatic.  Cardiovascular: Normal rate and regular rhythm.   Murmur (2/6 systolic ejction murmur) heard. Pulmonary/Chest: Effort normal and breath sounds normal.  Musculoskeletal:       Back:   Red area denotes large area of swelling. Exquisitely tender. Blue area denotes bruising and ecchymosis. Reduced ROM when bending at waist due to pain.    Neurological: He is alert and oriented to person, place, and time. He has normal reflexes.          Assessment & Plan:  Large contusion to lower back resulting in hematoma. Will start Mobic during the day and Flexeril at night. Advise ice or heat, whichever feels better. Will recheck in 1 week. Given work note for restrictions on lifting and bending.

## 2012-05-01 ENCOUNTER — Ambulatory Visit: Payer: BC Managed Care – PPO | Admitting: Nurse Practitioner

## 2012-05-04 ENCOUNTER — Other Ambulatory Visit: Payer: Self-pay | Admitting: *Deleted

## 2012-05-04 DIAGNOSIS — J302 Other seasonal allergic rhinitis: Secondary | ICD-10-CM

## 2012-05-04 DIAGNOSIS — K219 Gastro-esophageal reflux disease without esophagitis: Secondary | ICD-10-CM

## 2012-05-04 MED ORDER — ESOMEPRAZOLE MAGNESIUM 40 MG PO CPDR: 40.0000 mg | DELAYED_RELEASE_CAPSULE | Freq: Every day | ORAL | Status: DC

## 2012-05-04 MED ORDER — FLUTICASONE PROPIONATE 50 MCG/ACT NA SUSP: 1.0000 | Freq: Every day | NASAL | Status: DC

## 2012-05-04 NOTE — Telephone Encounter (Signed)
Patient last seen for chronic health check on 10-23-11. Has had two acute visits since then. Please advise. Thank you

## 2012-05-04 NOTE — Telephone Encounter (Signed)
Approved and ordered. FW

## 2012-05-06 ENCOUNTER — Ambulatory Visit: Payer: BC Managed Care – PPO | Admitting: Sports Medicine

## 2012-05-10 ENCOUNTER — Other Ambulatory Visit: Payer: Self-pay | Admitting: *Deleted

## 2012-05-10 MED ORDER — MONTELUKAST SODIUM 10 MG PO TABS: 10.0000 mg | ORAL_TABLET | Freq: Every day | ORAL | Status: DC

## 2012-05-13 ENCOUNTER — Encounter: Payer: Self-pay | Admitting: Emergency Medicine

## 2012-05-15 ENCOUNTER — Ambulatory Visit (INDEPENDENT_AMBULATORY_CARE_PROVIDER_SITE_OTHER): Payer: BC Managed Care – PPO | Admitting: Family Medicine

## 2012-05-15 VITALS — BP 140/90 | HR 80 | Temp 97.3°F | Resp 18 | Wt 188.0 lb

## 2012-05-15 DIAGNOSIS — M545 Low back pain, unspecified: Secondary | ICD-10-CM

## 2012-05-15 DIAGNOSIS — Z5189 Encounter for other specified aftercare: Secondary | ICD-10-CM

## 2012-05-15 DIAGNOSIS — S300XXD Contusion of lower back and pelvis, subsequent encounter: Secondary | ICD-10-CM

## 2012-05-15 MED ORDER — TRAMADOL HCL 50 MG PO TABS: 50.0000 mg | ORAL_TABLET | Freq: Three times a day (TID) | ORAL | Status: DC | PRN

## 2012-05-15 MED ORDER — CYCLOBENZAPRINE HCL 10 MG PO TABS: ORAL_TABLET | ORAL | Status: DC

## 2012-05-15 MED ORDER — MELOXICAM 7.5 MG PO TABS: ORAL_TABLET | ORAL | Status: DC

## 2012-05-15 NOTE — Progress Notes (Signed)
Subjective: 52 year old man who slipped about 3 weeks ago and hit he is right low back. He slipped on ice on the steps. He has been seen by Dr. Cleta Alberts for the contusion and hematoma of his right lower back. He continues to hurt him a lot. He was not able to tolerate functioning with taking Percocet for pain. However the meloxicam has done a fair job. He does hurt more at times.  He has a history of a muscular dystrophy for which takes amitriptyline.  He has been taking the muscle relaxants cyclobenzaprine at bedtime.  He has continued to work  Objective: 11.3 x 11.5 cm hematoma right low back. The ecchymosis is almost resolved. He is able to flex, extend and flex laterally to an adequate degree. However he does have significant pain in it.  Assessment: Traumatic hematoma low back Low back contusion Low back strain  Plan: Try using some tramadol on a when necessary basis when the pain is too bad. Talk to him about the fact that this may take several months to absorb. I do not recommend surgical drainage as that allows the chance of an introduction of infection. There is some lowering of the seizure threshold with a combination of tramadol and amitriptyline, however I believe that this is safe and someone who is been stable for many years on amitriptyline at a fairly low dose.  Return in 3-4 week

## 2012-05-15 NOTE — Patient Instructions (Signed)
Continue current medications  Use the Tramadol when needed for severe pain.  Return if worse at anytime  Recheck in 3-4 weeks

## 2012-05-26 ENCOUNTER — Other Ambulatory Visit: Payer: Self-pay

## 2012-05-26 MED ORDER — PRAVASTATIN SODIUM 40 MG PO TABS: 40.0000 mg | ORAL_TABLET | Freq: Every day | ORAL | Status: DC

## 2012-05-26 MED ORDER — AZELASTINE HCL 0.1 % NA SOLN: 2.0000 | Freq: Every day | NASAL | Status: DC

## 2012-05-26 MED ORDER — GLUCOSE BLOOD VI STRP: ORAL_STRIP | Status: DC

## 2012-06-01 ENCOUNTER — Encounter: Payer: Self-pay | Admitting: Sports Medicine

## 2012-06-01 ENCOUNTER — Ambulatory Visit (INDEPENDENT_AMBULATORY_CARE_PROVIDER_SITE_OTHER): Payer: BC Managed Care – PPO | Admitting: Sports Medicine

## 2012-06-01 VITALS — BP 152/80 | HR 61 | Ht 69.0 in | Wt 188.0 lb

## 2012-06-01 DIAGNOSIS — M25519 Pain in unspecified shoulder: Secondary | ICD-10-CM

## 2012-06-01 DIAGNOSIS — M25511 Pain in right shoulder: Secondary | ICD-10-CM

## 2012-06-01 NOTE — Assessment & Plan Note (Signed)
This is much improved and since his pain has pretty much resolved I think he can stop the nitroglycerin  He should gradually build the strength back to 100%  Continue to do some exercises to strengthen the upper back and balance the shoulder  Recheck with [place] as needed

## 2012-06-01 NOTE — Progress Notes (Signed)
  Subjective:    Patient ID: Johnny Mathews, male    DOB: 1960/08/11, 52 y.o.   MRN: [id number]  HPI  Pt presents to clinic for f/u of rt shoulder pain which is about 50% improved. He experienced a severe fall on ice in early April- caused back pain and bruising on lower back. Has been consistent with home exercises except during the time he was recovering from his fall. Now the area of hematoma is still firm but is no longer painful  Had been using NTG daily until recently, now uses a few times per week.  He states that he has no pain with the activities that used to hurt him at work. The shoulder and back Shoulder does not hurt with any of the exercises but has only gained back about 50% of the strength.    Review of Systems     Objective:   Physical Exam No acute distress  Rt shoulder: Normal ROM Speed's and yergason's negative Biceptal groove non tender IR and ER at 90 degrees good  Empty can and Hawkins and impingement testing are negative Strength, abduction is good at 3 levels Elevation strength is good  Hematoma of her right lower back is now no longer discolored or bruised This feels firm and rubbery No pain with palpation or movement Size is about 10 x 8 cm       Assessment & Plan:   Traumatic hematoma of the right lower back seems to stabilize and will gradually resolve

## 2012-06-01 NOTE — Patient Instructions (Addendum)
Make sure you continue to incorporate - chest flies, lat pull downs, standing rowing motions   Ok to stop nitroglycerin patch  Please follow up as needed  Thank you for seeing [place] today!

## 2012-07-08 ENCOUNTER — Other Ambulatory Visit: Payer: Self-pay | Admitting: *Deleted

## 2012-07-08 MED ORDER — MONTELUKAST SODIUM 10 MG PO TABS: 10.0000 mg | ORAL_TABLET | Freq: Every day | ORAL | Status: DC

## 2012-07-08 MED ORDER — RANITIDINE HCL 150 MG PO TABS: 150.0000 mg | ORAL_TABLET | Freq: Two times a day (BID) | ORAL | Status: DC

## 2012-07-19 ENCOUNTER — Other Ambulatory Visit: Payer: Self-pay

## 2012-07-19 NOTE — Telephone Encounter (Signed)
Last seen 03/31/12  ACM

## 2012-07-20 MED ORDER — FUROSEMIDE 20 MG PO TABS: 20.0000 mg | ORAL_TABLET | Freq: Every day | ORAL | Status: DC

## 2012-07-20 MED ORDER — AZELASTINE HCL 0.1 % NA SOLN: 2.0000 | Freq: Every day | NASAL | Status: DC

## 2012-08-09 ENCOUNTER — Other Ambulatory Visit: Payer: Self-pay | Admitting: *Deleted

## 2012-08-09 DIAGNOSIS — K219 Gastro-esophageal reflux disease without esophagitis: Secondary | ICD-10-CM

## 2012-08-09 MED ORDER — ESOMEPRAZOLE MAGNESIUM 40 MG PO CPDR: 40.0000 mg | DELAYED_RELEASE_CAPSULE | Freq: Every day | ORAL | Status: DC

## 2012-08-10 ENCOUNTER — Other Ambulatory Visit: Payer: Self-pay | Admitting: *Deleted

## 2012-08-10 MED ORDER — MONTELUKAST SODIUM 10 MG PO TABS: 10.0000 mg | ORAL_TABLET | Freq: Every day | ORAL | Status: DC

## 2012-08-23 ENCOUNTER — Other Ambulatory Visit: Payer: Self-pay | Admitting: *Deleted

## 2012-08-23 DIAGNOSIS — J302 Other seasonal allergic rhinitis: Secondary | ICD-10-CM

## 2012-08-23 MED ORDER — FLUTICASONE PROPIONATE 50 MCG/ACT NA SUSP: 1.0000 | Freq: Every day | NASAL | Status: DC

## 2012-09-02 ENCOUNTER — Encounter: Payer: Self-pay | Admitting: General Practice

## 2012-09-02 ENCOUNTER — Ambulatory Visit (INDEPENDENT_AMBULATORY_CARE_PROVIDER_SITE_OTHER): Payer: BC Managed Care – PPO | Admitting: General Practice

## 2012-09-02 VITALS — BP 131/70 | HR 67 | Temp 98.7°F | Ht 69.0 in | Wt 188.0 lb

## 2012-09-02 DIAGNOSIS — J322 Chronic ethmoidal sinusitis: Secondary | ICD-10-CM

## 2012-09-02 MED ORDER — METHYLPREDNISOLONE ACETATE 80 MG/ML IJ SUSP
80.0000 mg | Freq: Once | INTRAMUSCULAR | Status: AC
  Administered 2012-09-02: 80 mg via INTRAMUSCULAR

## 2012-09-02 MED ORDER — AZITHROMYCIN 250 MG PO TABS: ORAL_TABLET | ORAL | Status: DC

## 2012-09-02 NOTE — Progress Notes (Signed)
  Subjective:    Patient ID: EION TIMBROOK, male    DOB: 1960/09/18, 52 y.o.   MRN: [id number]  Sinusitis This is a new problem. The current episode started yesterday. The problem has been gradually worsening since onset. There has been no fever. His pain is at a severity of 0/10. Associated symptoms include congestion, coughing and sinus pressure. Pertinent negatives include no chills, headaches, neck pain or shortness of breath. Past treatments include nothing.  Reports currently taking bydureon and would like to start taking byetta again. Reports checking blood sugars three times daily and ranges, 100-140's.     Review of Systems  Constitutional: Negative for chills.  HENT: Positive for congestion and sinus pressure. Negative for neck pain and neck stiffness.   Respiratory: Positive for cough. Negative for shortness of breath.   Cardiovascular: Negative for chest pain.  Neurological: Negative for dizziness, weakness and headaches.       Objective:   Physical Exam  Constitutional: He is oriented to person, place, and time. He appears well-developed and well-nourished.  HENT:  Head: Normocephalic and atraumatic.  Right Ear: External ear normal.  Left Ear: External ear normal.  Nose: Right sinus exhibits maxillary sinus tenderness and frontal sinus tenderness. Left sinus exhibits maxillary sinus tenderness and frontal sinus tenderness.  Mouth/Throat: Posterior oropharyngeal erythema present.  Cardiovascular: Normal rate, regular rhythm and normal heart sounds.   Pulmonary/Chest: Effort normal and breath sounds normal.  Neurological: He is alert and oriented to person, place, and time.  Skin: Skin is warm and dry.  Psychiatric: He has a normal mood and affect.          Assessment & Plan:  1. Ethmoid sinusitis - azithromycin (ZITHROMAX) 250 MG tablet; Take as directed  Dispense: 6 tablet; Refill: 0 - methylPREDNISolone acetate (DEPO-MEDROL) injection 80 mg; Inject 1 mL (80 mg  total) into the muscle once. -increase fluids -new toothbrush in 3 days -schedule appointment with Tammy to discuss resuming Byetta -RTO if symptoms worsen or no improvement -Patient verbalized understanding -Coralie Keens, FNP-C

## 2012-09-02 NOTE — Patient Instructions (Signed)

## 2012-09-09 ENCOUNTER — Other Ambulatory Visit: Payer: Self-pay

## 2012-09-09 NOTE — Telephone Encounter (Signed)
Last seen 09/02/12  Mae   Symbicort  Was not on EPIC list of meds  Last lipids drawn 10/23/11

## 2012-09-13 ENCOUNTER — Ambulatory Visit: Payer: BC Managed Care – PPO

## 2012-09-14 ENCOUNTER — Other Ambulatory Visit: Payer: Self-pay

## 2012-09-14 MED ORDER — AMITRIPTYLINE HCL 25 MG PO TABS: 75.0000 mg | ORAL_TABLET | Freq: Every day | ORAL | Status: DC

## 2012-09-14 MED ORDER — MONTELUKAST SODIUM 10 MG PO TABS: 10.0000 mg | ORAL_TABLET | Freq: Every day | ORAL | Status: DC

## 2012-09-15 MED ORDER — RANITIDINE HCL 150 MG PO TABS: 150.0000 mg | ORAL_TABLET | Freq: Two times a day (BID) | ORAL | Status: DC

## 2012-09-15 MED ORDER — BUDESONIDE-FORMOTEROL FUMARATE 160-4.5 MCG/ACT IN AERO: 2.0000 | INHALATION_SPRAY | Freq: Two times a day (BID) | RESPIRATORY_TRACT | Status: DC

## 2012-09-15 MED ORDER — PRAVASTATIN SODIUM 40 MG PO TABS: 40.0000 mg | ORAL_TABLET | Freq: Every day | ORAL | Status: DC

## 2012-09-23 ENCOUNTER — Encounter: Payer: Self-pay | Admitting: Family Medicine

## 2012-09-23 ENCOUNTER — Ambulatory Visit (INDEPENDENT_AMBULATORY_CARE_PROVIDER_SITE_OTHER): Payer: BC Managed Care – PPO | Admitting: Family Medicine

## 2012-09-23 VITALS — BP 128/75 | HR 73 | Temp 98.9°F | Ht 69.0 in | Wt 183.0 lb

## 2012-09-23 DIAGNOSIS — S39012S Strain of muscle, fascia and tendon of lower back, sequela: Secondary | ICD-10-CM

## 2012-09-23 DIAGNOSIS — IMO0002 Reserved for concepts with insufficient information to code with codable children: Secondary | ICD-10-CM

## 2012-09-23 MED ORDER — PREDNISONE 50 MG PO TABS: ORAL_TABLET | ORAL | Status: DC

## 2012-09-23 MED ORDER — CYCLOBENZAPRINE HCL 10 MG PO TABS: 10.0000 mg | ORAL_TABLET | Freq: Three times a day (TID) | ORAL | Status: DC | PRN

## 2012-09-23 NOTE — Progress Notes (Signed)
  Subjective:    Patient ID: Johnny Mathews, male    DOB: 1960/08/03, 52 y.o.   MRN: [id number]  HPI The patient presents today with back pain. Location: lumbar region  Timing: constant. Present for last 1-2 weeks.  Description: persistent lumbar back pain with some radiation to R buttock.  Modifying Factors: Has prior L5-S1 disease s/p traumatic fall last winter as well as hxo secondary R buttock traumatic hematoma.  Worse with: laying down and lifting objects Better with: rest  Trauma: no Bladder/bowel incontinence: no Weakness: no Fever/chills: no Night pain:no Unexplained weight loss: no Cancer/immunosuppression: n PMH of osteoporosis or chronic steroid use:  no     Review of Systems  All other systems reviewed and are negative.       Objective:   Physical Exam  Constitutional: He is oriented to person, place, and time. He appears well-developed and well-nourished.  HENT:  Head: Normocephalic and atraumatic.  Eyes: Conjunctivae are normal. Pupils are equal, round, and reactive to light.  Neck: Normal range of motion.  Cardiovascular: Normal rate and regular rhythm.   Pulmonary/Chest: Effort normal and breath sounds normal.  Abdominal: Soft.  Musculoskeletal:       Arms: + TTP over affected area  + pain with passive hip flexion.  FABER mildly positive.  Neurovasculalry in tact distally    Neurological: He is alert and oriented to person, place, and time.  Skin: Skin is warm.          Assessment & Plan:  Lumbar strain, sequela - Plan: predniSONE (DELTASONE) 50 MG tablet, cyclobenzaprine (FLEXERIL) 10 MG tablet   Suspect lumbar strain recurrence. Discussed imaging. Patient declined. Symptoms are mild in comparison to previous. Will place on course of prednisone Flexeril for acute treatment. Discussed general a musculoskeletal red flags. RICE.  Followup as needed

## 2012-10-20 ENCOUNTER — Encounter: Payer: Self-pay | Admitting: Family Medicine

## 2012-10-20 ENCOUNTER — Ambulatory Visit (INDEPENDENT_AMBULATORY_CARE_PROVIDER_SITE_OTHER): Payer: BC Managed Care – PPO | Admitting: Family Medicine

## 2012-10-20 ENCOUNTER — Ambulatory Visit: Payer: BC Managed Care – PPO | Admitting: Family Medicine

## 2012-10-20 VITALS — BP 141/80 | HR 70 | Temp 98.5°F | Ht 69.0 in | Wt 191.6 lb

## 2012-10-20 DIAGNOSIS — J309 Allergic rhinitis, unspecified: Secondary | ICD-10-CM

## 2012-10-20 DIAGNOSIS — K219 Gastro-esophageal reflux disease without esophagitis: Secondary | ICD-10-CM

## 2012-10-20 DIAGNOSIS — J302 Other seasonal allergic rhinitis: Secondary | ICD-10-CM

## 2012-10-20 DIAGNOSIS — E119 Type 2 diabetes mellitus without complications: Secondary | ICD-10-CM

## 2012-10-20 DIAGNOSIS — E785 Hyperlipidemia, unspecified: Secondary | ICD-10-CM

## 2012-10-20 DIAGNOSIS — Z0000 Encounter for general adult medical examination without abnormal findings: Secondary | ICD-10-CM

## 2012-10-20 DIAGNOSIS — R609 Edema, unspecified: Secondary | ICD-10-CM

## 2012-10-20 DIAGNOSIS — J45901 Unspecified asthma with (acute) exacerbation: Secondary | ICD-10-CM

## 2012-10-20 LAB — POCT CBC
Granulocyte percent: 72.5 %G (ref 37–80)
HCT, POC: 38.9 % — AB (ref 43.5–53.7)
Hemoglobin: 13.1 g/dL — AB (ref 14.1–18.1)
Lymph, poc: 1.4 (ref 0.6–3.4)
MCH, POC: 30.3 pg (ref 27–31.2)
MCHC: 33.8 g/dL (ref 31.8–35.4)
MCV: 89.5 fL (ref 80–97)
MPV: 6.9 fL (ref 0–99.8)
POC Granulocyte: 5.2 (ref 2–6.9)
POC LYMPH PERCENT: 20.1 %L (ref 10–50)
Platelet Count, POC: 239 10*3/uL (ref 142–424)
RBC: 4.3 M/uL — AB (ref 4.69–6.13)
RDW, POC: 14.1 %
WBC: 7.2 10*3/uL (ref 4.6–10.2)

## 2012-10-20 LAB — POCT GLYCOSYLATED HEMOGLOBIN (HGB A1C): Hemoglobin A1C: 4.9

## 2012-10-20 MED ORDER — EXENATIDE ER 2 MG ~~LOC~~ SUSR: 2.0000 mg | SUBCUTANEOUS | Status: DC

## 2012-10-20 MED ORDER — AZITHROMYCIN 250 MG PO TABS: ORAL_TABLET | ORAL | Status: DC

## 2012-10-20 MED ORDER — ALBUTEROL SULFATE HFA 108 (90 BASE) MCG/ACT IN AERS: 2.0000 | INHALATION_SPRAY | RESPIRATORY_TRACT | Status: DC | PRN

## 2012-10-20 MED ORDER — MONTELUKAST SODIUM 10 MG PO TABS: 10.0000 mg | ORAL_TABLET | Freq: Every day | ORAL | Status: DC

## 2012-10-20 MED ORDER — MOMETASONE FURO-FORMOTEROL FUM 100-5 MCG/ACT IN AERO: 2.0000 | INHALATION_SPRAY | Freq: Two times a day (BID) | RESPIRATORY_TRACT | Status: DC

## 2012-10-20 MED ORDER — ESOMEPRAZOLE MAGNESIUM 40 MG PO CPDR: 40.0000 mg | DELAYED_RELEASE_CAPSULE | Freq: Every day | ORAL | Status: DC

## 2012-10-20 MED ORDER — AZELASTINE HCL 0.1 % NA SOLN: 2.0000 | Freq: Every day | NASAL | Status: DC

## 2012-10-20 MED ORDER — PRAVASTATIN SODIUM 40 MG PO TABS: 40.0000 mg | ORAL_TABLET | Freq: Every day | ORAL | Status: DC

## 2012-10-20 MED ORDER — PREDNISONE 10 MG PO TABS: ORAL_TABLET | ORAL | Status: DC

## 2012-10-20 MED ORDER — FUROSEMIDE 20 MG PO TABS: 20.0000 mg | ORAL_TABLET | Freq: Every day | ORAL | Status: DC

## 2012-10-20 MED ORDER — FLUTICASONE PROPIONATE 50 MCG/ACT NA SUSP: 1.0000 | Freq: Every day | NASAL | Status: DC

## 2012-10-20 MED ORDER — HYDROCODONE-HOMATROPINE 5-1.5 MG/5ML PO SYRP: 5.0000 mL | ORAL_SOLUTION | Freq: Three times a day (TID) | ORAL | Status: DC | PRN

## 2012-10-20 NOTE — Patient Instructions (Signed)
Asthma Prevention  Cigarette smoke, house dust, molds, pollens, animal dander, certain insects, exercise, and even cold air are all triggers that can cause an asthma attack. Often, no specific triggers are identified.   Take the following measures around your house to reduce attacks:  · Avoid cigarette and other smoke. No smoking should be allowed in a home where someone with asthma lives. If smoking is allowed indoors, it should be done in a room with a closed door, and a window should be opened to clear the air. If possible, do not use a wood-burning stove, kerosene heater, or fireplace. Minimize exposure to all sources of smoke, including incense, candles, fires, and fireworks.  · Decrease pollen exposure. Keep your windows shut and use central air during the pollen allergy season. Stay indoors with windows closed from late morning to afternoon, if you can. Avoid mowing the lawn if you have grass pollen allergy. Change your clothes and shower after being outside during this time of year.  · Remove molds from bathrooms and wet areas. Do this by cleaning the floors with a fungicide or diluted bleach. Avoid using humidifiers, vaporizers, or swamp coolers. These can spread molds through the air. Fix leaky faucets, pipes, or other sources of water that have mold around them.  · Decrease house dust exposure. Do this by using bare floors, vacuuming frequently, and changing furnace and air cooler filters frequently. Avoid using feather, wool, or foam bedding. Use polyester pillows and plastic covers over your mattress. Wash bedding weekly in hot water (hotter than 130° F).  · Try to get someone else to vacuum for you once or twice a week, if you can. Stay out of rooms while they are being vacuumed and for a short while afterward. If you vacuum, use a dust mask (from a hardware store), a double-layered or microfilter vacuum cleaner bag, or a vacuum cleaner with a HEPA filter.  · Avoid perfumes, talcum powder, hair spray,  paints and other strong odors and fumes.  · Keep warm-blooded pets (cats, dogs, rodents, birds) outside the home if they are triggers for asthma. If you can't keep the pet outdoors, keep the pet out of your bedroom and other sleeping areas at all times, and keep the door closed. Remove carpets and furniture covered with cloth from your home. If that is not possible, keep the pet away from fabric-covered furniture and carpets.  · Eliminate cockroaches. Keep food and garbage in closed containers. Never leave food out. Use poison baits, traps, powders, gels, or paste (for example, boric acid). If a spray is used to kill cockroaches, stay out of the room until the odor goes away.  · Decrease indoor humidity to less than 60%. Use an indoor air cleaning device.  · Avoid sulfites in foods and beverages. Do not drink beer or wine or eat dried fruit, processed potatoes, or shrimp if they cause asthma symptoms.  · Avoid cold air. Cover your nose and mouth with a scarf on cold or windy days.  · Avoid aspirin. This is the most common drug causing serious asthma attacks.  · If exercise triggers your asthma, ask your caregiver how you should prepare before exercising. (For example, ask if you could use your inhaler 10 minutes before exercising.)  · Avoid close contact with people who have a cold or the flu since your asthma symptoms may get worse if you catch the infection from them. Wash your hands thoroughly after touching items that may have been handled by   others with a respiratory infection.  · Get a flu shot every year to protect against the flu virus, which often makes asthma worse for days to weeks. Also get a pneumonia shot once every five to 10 years.  Call your caregiver if you want further information about measures you can take to help prevent asthma attacks.  Document Released: 12/23/2004 Document Revised: 03/17/2011 Document Reviewed: 10/31/2008  Exit[employer]® Patient Information ©2014 [employer].

## 2012-10-20 NOTE — Progress Notes (Signed)
  Subjective:    Patient ID: Johnny Mathews, male    DOB: [birth year], 52 y.o.   MRN: [id number]  HPI This 52 y.o. male presents for evaluation of asthma exacerbation.  He is having difficulty With night time coughing and wheezing.  He also needs some refills on his medications and Is due for CPE and annual labs.   Review of Systems C/o cough and uri sx's No chest pain, SOB, HA, dizziness, vision change, N/V, diarrhea, constipation, dysuria, urinary urgency or frequency, myalgias, arthralgias or rash.     Objective:   Physical Exam Vital signs noted  Well developed well nourished male.  HEENT - Head atraumatic Normocephalic                Eyes - PERRLA, Conjuctiva - clear Sclera- Clear EOMI                Ears - EAC's Wnl TM's Wnl Gross Hearing WNL                Nose - Nares patent                 Throat - oropharanx wnl Respiratory - Lungs with exp wheezes bilateral Cardiac - RRR S1 and S2 without murmur GI - Abdomen soft Nontender and bowel sounds active x 4 Extremities - No edema. Neuro - Grossly intact.       Assessment & Plan:  GERD (gastroesophageal reflux disease) - Plan: esomeprazole (NEXIUM) 40 MG capsule  Seasonal allergic rhinitis - Plan: fluticasone (FLONASE) 50 MCG/ACT nasal spray  Asthma with acute exacerbation - Plan: predniSONE (DELTASONE) 10 MG tablet, azithromycin (ZITHROMAX) 250 MG tablet, HYDROcodone-homatropine (HYCODAN) 5-1.5 MG/5ML syrup, mometasone-formoterol (DULERA) 100-5 MCG/ACT AERO, albuterol (PROAIR HFA) 108 (90 BASE) MCG/ACT inhaler.  Follow up prn  Other and unspecified hyperlipidemia - Plan: pravastatin (PRAVACHOL) 40 MG tab  Seasonal allergies - Plan: montelukast (SINGULAIR) 10 MG tablet  Edema - Plan: furosemide (LASIX) 20 MG tablet  Routine general medical examination at a health care facility - Plan: POCT CBC, POCT glycosylated hemoglobin (Hb A1C), CMP14+EGFR, Lipid panel, PSA, total and free, Thyroid Panel With TSH  Deatra Canter FNP

## 2012-10-21 LAB — LIPID PANEL
Chol/HDL Ratio: 3.4 ratio units (ref 0.0–5.0)
Cholesterol, Total: 182 mg/dL (ref 100–199)
HDL: 53 mg/dL (ref >39)
LDL Calculated: 113 mg/dL — ABNORMAL HIGH (ref 0–99)
Triglycerides: 81 mg/dL (ref 0–149)
VLDL Cholesterol Cal: 16 mg/dL (ref 5–40)

## 2012-10-21 LAB — CMP14+EGFR
ALT: 25 IU/L (ref 0–44)
AST: 24 IU/L (ref 0–40)
Albumin/Globulin Ratio: 1.8 (ref 1.1–2.5)
Albumin: 4.2 g/dL (ref 3.5–5.5)
Alkaline Phosphatase: 109 IU/L (ref 39–117)
BUN/Creatinine Ratio: 12 (ref 9–20)
BUN: 14 mg/dL (ref 6–24)
CO2: 28 mmol/L (ref 18–29)
Calcium: 9 mg/dL (ref 8.7–10.2)
Chloride: 101 mmol/L (ref 97–108)
Creatinine, Ser: 1.16 mg/dL (ref 0.76–1.27)
GFR calc Af Amer: 82 mL/min/{1.73_m2} (ref >59)
GFR calc non Af Amer: 71 mL/min/{1.73_m2} (ref >59)
Globulin, Total: 2.4 g/dL (ref 1.5–4.5)
Glucose: 65 mg/dL (ref 65–99)
Potassium: 4.3 mmol/L (ref 3.5–5.2)
Sodium: 142 mmol/L (ref 134–144)
Total Bilirubin: 0.3 mg/dL (ref 0.0–1.2)
Total Protein: 6.6 g/dL (ref 6.0–8.5)

## 2012-10-21 LAB — THYROID PANEL WITH TSH
Free Thyroxine Index: 1.6 (ref 1.2–4.9)
T3 Uptake Ratio: 27 % (ref 24–39)
T4, Total: 5.9 ug/dL (ref 4.5–12.0)
TSH: 1.14 u[IU]/mL (ref 0.450–4.500)

## 2012-10-21 LAB — PSA, TOTAL AND FREE
PSA, Free Pct: 25.6 %
PSA, Free: 0.23 ng/mL
PSA: 0.9 ng/mL (ref 0.0–4.0)

## 2012-11-03 ENCOUNTER — Encounter: Payer: Self-pay | Admitting: Family Medicine

## 2012-11-03 ENCOUNTER — Ambulatory Visit (INDEPENDENT_AMBULATORY_CARE_PROVIDER_SITE_OTHER): Payer: BC Managed Care – PPO | Admitting: Family Medicine

## 2012-11-03 VITALS — BP 131/75 | HR 67 | Temp 98.0°F | Ht 68.25 in | Wt 190.4 lb

## 2012-11-03 DIAGNOSIS — K219 Gastro-esophageal reflux disease without esophagitis: Secondary | ICD-10-CM

## 2012-11-03 DIAGNOSIS — R5383 Other fatigue: Secondary | ICD-10-CM

## 2012-11-03 DIAGNOSIS — J309 Allergic rhinitis, unspecified: Secondary | ICD-10-CM

## 2012-11-03 DIAGNOSIS — D649 Anemia, unspecified: Secondary | ICD-10-CM

## 2012-11-03 DIAGNOSIS — J302 Other seasonal allergic rhinitis: Secondary | ICD-10-CM

## 2012-11-03 DIAGNOSIS — M62838 Other muscle spasm: Secondary | ICD-10-CM

## 2012-11-03 DIAGNOSIS — E785 Hyperlipidemia, unspecified: Secondary | ICD-10-CM

## 2012-11-03 DIAGNOSIS — R5381 Other malaise: Secondary | ICD-10-CM

## 2012-11-03 LAB — POCT CBC
Granulocyte percent: 72.2 %G (ref 37–80)
HCT, POC: 41.7 % — AB (ref 43.5–53.7)
Hemoglobin: 13.7 g/dL — AB (ref 14.1–18.1)
Lymph, poc: 1.5 (ref 0.6–3.4)
MCH, POC: 29.9 pg (ref 27–31.2)
MCHC: 32.9 g/dL (ref 31.8–35.4)
MCV: 90.7 fL (ref 80–97)
MPV: 6.7 fL (ref 0–99.8)
POC Granulocyte: 5.4 (ref 2–6.9)
POC LYMPH PERCENT: 19.5 %L (ref 10–50)
Platelet Count, POC: 267 10*3/uL (ref 142–424)
RBC: 4.6 M/uL — AB (ref 4.69–6.13)
RDW, POC: 14.4 %
WBC: 7.5 10*3/uL (ref 4.6–10.2)

## 2012-11-03 MED ORDER — FERROUS SULFATE 325 (65 FE) MG PO TBEC: DELAYED_RELEASE_TABLET | ORAL | Status: DC

## 2012-11-03 MED ORDER — ESOMEPRAZOLE MAGNESIUM 40 MG PO CPDR: 40.0000 mg | DELAYED_RELEASE_CAPSULE | Freq: Every day | ORAL | Status: DC

## 2012-11-03 MED ORDER — AZELASTINE-FLUTICASONE 137-50 MCG/ACT NA SUSP: 1.0000 | Freq: Two times a day (BID) | NASAL | Status: DC

## 2012-11-03 MED ORDER — METHYLPREDNISOLONE ACETATE 80 MG/ML IJ SUSP: 80.0000 mg | Freq: Once | INTRAMUSCULAR | Status: DC

## 2012-11-03 NOTE — Progress Notes (Signed)
  Subjective:    Patient ID: Johnny Mathews, male    DOB: [birth year], 52 y.o.   MRN: [id number]  HPI This 51 y.o. male presents for evaluation of wellness physical.  Patient Has had CPE a week ago and has low hgb.  His labs are otherwise normal. He has been having persistent GERD sx's.  He is taking zantac.  He has  Had surgery in the past for GERD and it isn't helping anymore.  He has Problems with allergies.  He has muscular dystrophy and sees Neurology at  [facility].  He has a lot of myalgias and states he just deals with the pain.  He is employed Full time. .   Review of Systems C/o allergies, GERD, myalgias, arthralgias. No chest pain, SOB, HA, dizziness, vision change, N/V, diarrhea, constipation, dysuria, urinary urgency or frequency, or rash.     Objective:   Physical Exam Vital signs noted  Well developed well nourished male.  HEENT - Head atraumatic Normocephalic                Eyes - PERRLA, Conjuctiva - clear Sclera- Clear EOMI                Ears - EAC's Wnl TM's Wnl Gross Hearing WNL                Nose - Nares patent                 Throat - oropharanx wnl Respiratory - Lungs CTA bilateral Cardiac - RRR S1 and S2 without murmur GI - Abdomen soft Nontender and bowel sounds active x 4 Extremities - No edema. Neuro - Grossly intact. MS - Muscle spasm right lumbar muscle.  Procedure - 2 cc's lidocaine and one cc depomedrol 80mg  injected in trigger point Fashion into muscle spasm right lower back.        Assessment & Plan:  Hyperlipemia - Plan: CANCELED: Lipid panel, CANCELED: Hepatic function panel  Fatigue - Plan: POCT CBC, Vit D  25 hydroxy (rtn osteoporosis monitoring), CANCELED: CMP14+EGFR, CANCELED: PSA, total and free, CANCELED: Thyroid Panel With TSH  Seasonal allergies - Plan: Azelastine-Fluticasone (DYMISTA) 137-50 MCG/ACT SUSP  Muscle spasm - Plan: methylPREDNISolone acetate (DEPO-MEDROL) injection 80 mg  Anemia - Plan: Anemia panel, ferrous sulfate  325 (65 FE) MG EC tablet take bid  GERD (gastroesophageal reflux disease) Add Nexium 40mg  po qd and if not better then follow up  Note given stating he has had CPE.  Labs are discussed and patient is advised to follow up if GERD sx's are still present.  Deatra Canter FNP

## 2012-11-03 NOTE — Patient Instructions (Signed)

## 2012-11-05 LAB — ANEMIA PROFILE B
Basophils Absolute: 0 10*3/uL (ref 0.0–0.2)
Basos: 0 %
Eos: 4 %
Eosinophils Absolute: 0.3 10*3/uL (ref 0.0–0.4)
Ferritin: 28 ng/mL — ABNORMAL LOW (ref 30–400)
Folate: >19.9 ng/mL (ref >3.0)
HCT: 40.5 % (ref 37.5–51.0)
Hemoglobin: 13.7 g/dL (ref 12.6–17.7)
Immature Grans (Abs): 0 10*3/uL (ref 0.0–0.1)
Immature Granulocytes: 0 %
Iron Saturation: 30 % (ref 15–55)
Iron: 108 ug/dL (ref 40–155)
Lymphocytes Absolute: 1.4 10*3/uL (ref 0.7–3.1)
Lymphs: 19 %
MCH: 30.6 pg (ref 26.6–33.0)
MCHC: 33.8 g/dL (ref 31.5–35.7)
MCV: 91 fL (ref 79–97)
Monocytes Absolute: 0.7 10*3/uL (ref 0.1–0.9)
Monocytes: 10 %
Neutrophils Absolute: 4.9 10*3/uL (ref 1.4–7.0)
Neutrophils Relative %: 67 %
Platelets: 243 10*3/uL (ref 150–379)
RBC: 4.47 x10E6/uL (ref 4.14–5.80)
RDW: 14.5 % (ref 12.3–15.4)
Retic Ct Pct: 1.1 % (ref 0.6–2.6)
TIBC: 358 ug/dL (ref 250–450)
UIBC: 250 ug/dL (ref 150–375)
Vitamin B-12: 588 pg/mL (ref 211–946)
WBC: 7.4 10*3/uL (ref 3.4–10.8)

## 2012-11-05 LAB — VITAMIN D 25 HYDROXY (VIT D DEFICIENCY, FRACTURES): Vit D, 25-Hydroxy: 35.6 ng/mL (ref 30.0–100.0)

## 2012-11-11 ENCOUNTER — Other Ambulatory Visit: Payer: Self-pay

## 2012-11-15 ENCOUNTER — Telehealth: Payer: Self-pay | Admitting: Family Medicine

## 2012-11-15 NOTE — Telephone Encounter (Signed)
DONE

## 2012-12-07 ENCOUNTER — Other Ambulatory Visit: Payer: Self-pay

## 2012-12-07 MED ORDER — AMITRIPTYLINE HCL 25 MG PO TABS: 75.0000 mg | ORAL_TABLET | Freq: Every day | ORAL | Status: DC

## 2012-12-15 ENCOUNTER — Telehealth: Payer: Self-pay | Admitting: Family Medicine

## 2012-12-15 ENCOUNTER — Encounter: Payer: Self-pay | Admitting: Family Medicine

## 2012-12-15 ENCOUNTER — Ambulatory Visit (INDEPENDENT_AMBULATORY_CARE_PROVIDER_SITE_OTHER): Payer: BC Managed Care – PPO | Admitting: Family Medicine

## 2012-12-15 VITALS — BP 149/78 | HR 75 | Temp 99.4°F | Ht 69.0 in | Wt 190.6 lb

## 2012-12-15 DIAGNOSIS — J069 Acute upper respiratory infection, unspecified: Secondary | ICD-10-CM

## 2012-12-15 MED ORDER — AZITHROMYCIN 250 MG PO TABS: ORAL_TABLET | ORAL | Status: DC

## 2012-12-15 NOTE — Progress Notes (Signed)
   Subjective:    Patient ID: Johnny Mathews, male    DOB: October 21, 1960, 52 y.o.   MRN: [id number]  HPI This 52 y.o. male presents for evaluation of feeling weak and having Some low bp readings at work. He states he has URI sx's headache And a few days ago he had his bp taken at work and it was in the 90's systolic.   Review of Systems C/o headache, URI, and weakness. No chest pain, SOB, dizziness, vision change, N/V, diarrhea, constipation, dysuria, urinary urgency or frequency, myalgias, arthralgias or rash.     Objective:   Physical Exam Vital signs noted  Well developed well nourished male.  HEENT - Head atraumatic Normocephalic                Eyes - PERRLA, Conjuctiva - clear Sclera- Clear EOMI                Ears - EAC's Wnl TM's Wnl Gross Hearing WNL                 Throat - oropharanx wnl Respiratory - Lungs CTA bilateral Cardiac - RRR S1 and S2 without murmur BP - 120/80 standing GI - Abdomen soft Nontender and bowel sounds active x 4 Extremities - No edema. Neuro - Grossly intact.       Assessment & Plan:  URI, acute - Plan: azithromycin (ZITHROMAX) 250 MG tablet Push po fluids, rest, tylenol and motrin otc prn as directed for fever, arthralgias, and myalgias.  Follow up prn if sx's continue or persist.  Deatra Canter FNP

## 2012-12-15 NOTE — Telephone Encounter (Signed)
appt scheduled

## 2012-12-23 ENCOUNTER — Telehealth: Payer: Self-pay | Admitting: Family Medicine

## 2012-12-23 ENCOUNTER — Encounter: Payer: Self-pay | Admitting: Nurse Practitioner

## 2012-12-23 ENCOUNTER — Ambulatory Visit (INDEPENDENT_AMBULATORY_CARE_PROVIDER_SITE_OTHER): Payer: BC Managed Care – PPO | Admitting: Nurse Practitioner

## 2012-12-23 VITALS — BP 134/85 | HR 74 | Temp 99.0°F | Ht 69.0 in | Wt 193.0 lb

## 2012-12-23 DIAGNOSIS — E1169 Type 2 diabetes mellitus with other specified complication: Secondary | ICD-10-CM | POA: Insufficient documentation

## 2012-12-23 DIAGNOSIS — J4520 Mild intermittent asthma, uncomplicated: Secondary | ICD-10-CM | POA: Insufficient documentation

## 2012-12-23 DIAGNOSIS — R609 Edema, unspecified: Secondary | ICD-10-CM | POA: Insufficient documentation

## 2012-12-23 DIAGNOSIS — E782 Mixed hyperlipidemia: Secondary | ICD-10-CM | POA: Insufficient documentation

## 2012-12-23 DIAGNOSIS — J45901 Unspecified asthma with (acute) exacerbation: Secondary | ICD-10-CM

## 2012-12-23 DIAGNOSIS — K219 Gastro-esophageal reflux disease without esophagitis: Secondary | ICD-10-CM | POA: Insufficient documentation

## 2012-12-23 DIAGNOSIS — J4521 Mild intermittent asthma with (acute) exacerbation: Secondary | ICD-10-CM

## 2012-12-23 DIAGNOSIS — E119 Type 2 diabetes mellitus without complications: Secondary | ICD-10-CM | POA: Insufficient documentation

## 2012-12-23 DIAGNOSIS — E785 Hyperlipidemia, unspecified: Secondary | ICD-10-CM | POA: Insufficient documentation

## 2012-12-23 MED ORDER — HYDROCODONE-HOMATROPINE 5-1.5 MG/5ML PO SYRP: 5.0000 mL | ORAL_SOLUTION | Freq: Three times a day (TID) | ORAL | Status: DC | PRN

## 2012-12-23 MED ORDER — METHYLPREDNISOLONE ACETATE 80 MG/ML IJ SUSP
80.0000 mg | Freq: Once | INTRAMUSCULAR | Status: AC
  Administered 2012-12-23: 80 mg via INTRAMUSCULAR

## 2012-12-23 NOTE — Telephone Encounter (Signed)
appt made

## 2012-12-23 NOTE — Progress Notes (Signed)
   Subjective:    Patient ID: Johnny Mathews, male    DOB: [birth year], 52 y.o.   MRN: [id number]  HPI Patioent in today c/o cough that started 3 nights ago- has gotten worse as time goes on- having to use albuterol inhaler 3-4 times a day- low grade fever- headache    Review of Systems  Constitutional: Positive for fever. Negative for chills.  HENT: Positive for congestion, postnasal drip and rhinorrhea. Negative for sinus pressure, sore throat and trouble swallowing.   Respiratory: Positive for cough.   All other systems reviewed and are negative.       Objective:   Physical Exam  Constitutional: He is oriented to person, place, and time. He appears well-developed and well-nourished.  HENT:  Right Ear: Hearing, tympanic membrane, external ear and ear canal normal.  Left Ear: Hearing, tympanic membrane, external ear and ear canal normal.  Nose: Mucosal edema and rhinorrhea present. Right sinus exhibits no maxillary sinus tenderness and no frontal sinus tenderness. Left sinus exhibits no maxillary sinus tenderness and no frontal sinus tenderness.  Mouth/Throat: Uvula is midline, oropharynx is clear and moist and mucous membranes are normal.  Cardiovascular: Normal rate, regular rhythm and normal heart sounds.   Pulmonary/Chest: Effort normal. He has wheezes (faint in bil bases).  Deep cough  Neurological: He is oriented to person, place, and time.  Skin: Skin is warm. He is not diaphoretic.    BP 134/85  Pulse 74  Temp(Src) 99 F (37.2 C) (Oral)  Ht 5\' 9"  (1.753 m)  Wt 193 lb (87.544 kg)  BMI 28.49 kg/m2       Assessment & Plan:   1. Asthmatic bronchitis with exacerbation, mild intermittent    Meds ordered this encounter  Medications  . methylPREDNISolone acetate (DEPO-MEDROL) injection 80 mg    Sig:   . HYDROcodone-homatropine (HYCODAN) 5-1.5 MG/5ML syrup    Sig: Take 5 mLs by mouth every 8 (eight) hours as needed for cough.    Dispense:  120 mL    Refill:  0   Order Specific Question:  Supervising Provider    Answer:  Ernestina Penna [[id number]]   1. Take meds as prescribed 2. Use a cool mist humidifier especially during the winter months and when heat has  been humid. 3. Use saline nose sprays frequently 4. Saline irrigations of the nose can be very helpful if done frequently.  * 4X daily for 1 week*  * Use of a nettie pot can be helpful with this. Follow directions with this* 5. Drink plenty of fluids 6. Keep thermostat turn down low 7.For any cough or congestion  Use plain Mucinex- regular strength or max strength is fine   * Children- consult with Pharmacist for dosing 8. For fever or aces or pains- take tylenol or ibuprofen appropriate for age and weight.  * for fevers greater than 101 orally you may alternate ibuprofen and tylenol every  3 hours.   Mary-Margaret Daphine Deutscher, FNP

## 2012-12-23 NOTE — Patient Instructions (Signed)

## 2012-12-27 NOTE — Telephone Encounter (Signed)
Pt seen on 12/23/12.

## 2013-01-07 ENCOUNTER — Other Ambulatory Visit: Payer: Self-pay

## 2013-01-07 MED ORDER — AMITRIPTYLINE HCL 25 MG PO TABS: 75.0000 mg | ORAL_TABLET | Freq: Every day | ORAL | Status: DC

## 2013-01-24 ENCOUNTER — Encounter: Payer: Self-pay | Admitting: Family Medicine

## 2013-01-24 ENCOUNTER — Ambulatory Visit (INDEPENDENT_AMBULATORY_CARE_PROVIDER_SITE_OTHER): Payer: BC Managed Care – PPO | Admitting: Family Medicine

## 2013-01-24 ENCOUNTER — Ambulatory Visit (INDEPENDENT_AMBULATORY_CARE_PROVIDER_SITE_OTHER): Payer: BC Managed Care – PPO

## 2013-01-24 VITALS — BP 138/83 | HR 74 | Temp 97.8°F | Ht 69.0 in | Wt 191.0 lb

## 2013-01-24 DIAGNOSIS — M25519 Pain in unspecified shoulder: Secondary | ICD-10-CM

## 2013-01-24 DIAGNOSIS — M545 Low back pain, unspecified: Secondary | ICD-10-CM

## 2013-01-24 MED ORDER — NAPROXEN 500 MG PO TABS
500.0000 mg | ORAL_TABLET | Freq: Two times a day (BID) | ORAL | Status: DC
Start: 2013-01-24 — End: 2013-03-25

## 2013-01-24 MED ORDER — TRAMADOL HCL 50 MG PO TABS: 50.0000 mg | ORAL_TABLET | Freq: Three times a day (TID) | ORAL | Status: DC | PRN

## 2013-01-25 NOTE — Progress Notes (Signed)
   Subjective:    Patient ID: Johnny Mathews, male    DOB: [birth year], 53 y.o.   MRN: [id number]  HPI  This 53 y.o. male presents for evaluation of right shoulder discomfort.  He uses his right Shoulder doing repetitive movements at work.  Review of Systems C/o right shoulder discomfort   No chest pain, SOB, HA, dizziness, vision change, N/V, diarrhea, constipation, dysuria, urinary urgency or frequency or rash.  Objective:   Physical Exam  Vital signs noted  Well developed well nourished male.  HEENT - Head atraumatic Normocephalic                Eyes - PERRLA, Conjuctiva - clear Sclera- Clear EOMI Respiratory - Lungs CTA bilateral Cardiac - RRR S1 and S2 without murmur MS - TTP right shoulder at acromium.  Decreased ROM with right shoulder.  Xray of right shoulder - No fx    Assessment & Plan:  Pain in joint, shoulder region - Plan: DG Shoulder Right, naproxen (NAPROSYN) 500 MG tablet, traMADol (ULTRAM) 50 MG tablet  LBP (low back pain) - Plan: traMADol (ULTRAM) 50 MG tablet.  Lysbeth Penner FNP

## 2013-01-25 NOTE — Patient Instructions (Signed)
   Shoulder Pain The shoulder is the joint that connects your arms to your body. The bones that form the shoulder joint include the upper arm bone (humerus), the shoulder blade (scapula), and the collarbone (clavicle). The top of the humerus is shaped like a ball and fits into a rather flat socket on the scapula (glenoid cavity). A combination of muscles and strong, fibrous tissues that connect muscles to bones (tendons) support your shoulder joint and hold the ball in the socket. Small, fluid-filled sacs (bursae) are located in different areas of the joint. They act as cushions between the bones and the overlying soft tissues and help reduce friction between the gliding tendons and the bone as you move your arm. Your shoulder joint allows a wide range of motion in your arm. This range of motion allows you to do things like scratch your back or throw a ball. However, this range of motion also makes your shoulder more prone to pain from overuse and injury. Causes of shoulder pain can originate from both injury and overuse and usually can be grouped in the following four categories:  Redness, swelling, and pain (inflammation) of the tendon (tendinitis) or the bursae (bursitis).  Instability, such as a dislocation of the joint.  Inflammation of the joint (arthritis).  Broken bone (fracture). HOME CARE INSTRUCTIONS   Apply ice to the sore area.  Put ice in a plastic bag.  Place a towel between your skin and the bag.  Leave the ice on for 15-20 minutes, 03-04 times per day for the first 2 days.  Stop using cold packs if they do not help with the pain.  If you have a shoulder sling or immobilizer, wear it as long as your caregiver instructs. Only remove it to shower or bathe. Move your arm as little as possible, but keep your hand moving to prevent swelling.  Squeeze a soft ball or foam pad as much as possible to help prevent swelling.  Only take over-the-counter or prescription medicines for  pain, discomfort, or fever as directed by your caregiver. SEEK MEDICAL CARE IF:   Your shoulder pain increases, or new pain develops in your arm, hand, or fingers.  Your hand or fingers become cold and numb.  Your pain is not relieved with medicines. SEEK IMMEDIATE MEDICAL CARE IF:   Your arm, hand, or fingers are numb or tingling.  Your arm, hand, or fingers are significantly swollen or turn white or blue. MAKE SURE YOU:   Understand these instructions.  Will watch your condition.  Will get help right away if you are not doing well or get worse. Document Released: 10/02/2004 Document Revised: 09/17/2011 Document Reviewed: 12/07/2010 [employer] Patient Information 2014 [employer].  

## 2013-02-24 ENCOUNTER — Encounter: Payer: Self-pay | Admitting: Nurse Practitioner

## 2013-02-24 ENCOUNTER — Ambulatory Visit (INDEPENDENT_AMBULATORY_CARE_PROVIDER_SITE_OTHER): Payer: BC Managed Care – PPO | Admitting: Nurse Practitioner

## 2013-02-24 VITALS — BP 141/86 | HR 72 | Temp 97.6°F | Ht 69.0 in | Wt 188.0 lb

## 2013-02-24 DIAGNOSIS — J0100 Acute maxillary sinusitis, unspecified: Secondary | ICD-10-CM

## 2013-02-24 MED ORDER — AMOXICILLIN 875 MG PO TABS: 875.0000 mg | ORAL_TABLET | Freq: Two times a day (BID) | ORAL | Status: DC

## 2013-02-24 NOTE — Patient Instructions (Signed)

## 2013-02-24 NOTE — Progress Notes (Signed)
   Subjective:    Patient ID: Johnny Mathews, male    DOB: 07-15-1960, 53 y.o.   MRN: [id number]  HPI Patient here today with c/o sinus pressure, facial pain and wheezing- started 2 days ago- felt worse this morning.    Review of Systems  Constitutional: Negative for fever, chills and appetite change.  HENT: Positive for congestion, postnasal drip, rhinorrhea and sinus pressure. Negative for ear pain, sore throat and trouble swallowing.   Respiratory: Negative for cough.   Cardiovascular: Negative.   All other systems reviewed and are negative.       Objective:   Physical Exam  Constitutional: He is oriented to person, place, and time. He appears well-developed and well-nourished.  HENT:  Right Ear: Hearing, tympanic membrane, external ear and ear canal normal.  Left Ear: Hearing, tympanic membrane, external ear and ear canal normal.  Nose: Mucosal edema and rhinorrhea present. Right sinus exhibits maxillary sinus tenderness. Right sinus exhibits no frontal sinus tenderness. Left sinus exhibits maxillary sinus tenderness. Left sinus exhibits no frontal sinus tenderness.  Mouth/Throat: Uvula is midline, oropharynx is clear and moist and mucous membranes are normal.  Cardiovascular: Normal rate, regular rhythm and normal heart sounds.   Pulmonary/Chest: Effort normal and breath sounds normal.  Neurological: He is alert and oriented to person, place, and time.  Skin: Skin is warm.  BP 141/86  Pulse 72  Temp(Src) 97.6 F (36.4 C) (Oral)  Ht 5\' 9"  (1.753 m)  Wt 188 lb (85.276 kg)  BMI 27.75 kg/m2         Assessment & Plan:   1. Sinusitis, acute, maxillary    Meds ordered this encounter  Medications  . amoxicillin (AMOXIL) 875 MG tablet    Sig: Take 1 tablet (875 mg total) by mouth 2 (two) times daily.    Dispense:  20 tablet    Refill:  0    Order Specific Question:  Supervising Provider    Answer:  Chipper Herb [[id number]]   1. Take meds as prescribed 2. Use a cool  mist humidifier especially during the winter months and when heat has been humid. 3. Use saline nose sprays frequently 4. Saline irrigations of the nose can be very helpful if done frequently.  * 4X daily for 1 week*  * Use of a nettie pot can be helpful with this. Follow directions with this* 5. Drink plenty of fluids 6. Keep thermostat turn down low 7.For any cough or congestion  Use plain Mucinex- regular strength or max strength is fine   * Children- consult with Pharmacist for dosing 8. For fever or aces or pains- take tylenol or ibuprofen appropriate for age and weight.  * for fevers greater than 101 orally you may alternate ibuprofen and tylenol every  3 hours.   Mary-Margaret Hassell Done, FNP

## 2013-03-24 ENCOUNTER — Telehealth: Payer: Self-pay | Admitting: Nurse Practitioner

## 2013-03-24 NOTE — Telephone Encounter (Signed)
appt given  

## 2013-03-25 ENCOUNTER — Ambulatory Visit (INDEPENDENT_AMBULATORY_CARE_PROVIDER_SITE_OTHER): Payer: BC Managed Care – PPO | Admitting: Family Medicine

## 2013-03-25 VITALS — BP 140/75 | HR 70 | Temp 98.0°F | Ht 69.0 in | Wt 192.2 lb

## 2013-03-25 DIAGNOSIS — J209 Acute bronchitis, unspecified: Secondary | ICD-10-CM

## 2013-03-25 MED ORDER — METHYLPREDNISOLONE ACETATE 80 MG/ML IJ SUSP
80.0000 mg | Freq: Once | INTRAMUSCULAR | Status: AC
  Administered 2013-03-25: 80 mg via INTRAMUSCULAR

## 2013-03-25 MED ORDER — AMOXICILLIN 875 MG PO TABS
875.0000 mg | ORAL_TABLET | Freq: Two times a day (BID) | ORAL | Status: DC
Start: 2013-03-25 — End: 2013-04-20

## 2013-03-25 NOTE — Progress Notes (Signed)
   Subjective:    Patient ID: LEILAN BOCHENEK, male    DOB: [birth year], 53 y.o.   MRN: [id number]  HPI This 53 y.o. male presents for evaluation of cough and congestion.  He has hx of asthma and allergies and he states he has been having uri sx's..   Review of Systems C/o cough and uri sx's No chest pain, SOB, HA, dizziness, vision change, N/V, diarrhea, constipation, dysuria, urinary urgency or frequency, myalgias, arthralgias or rash.     Objective:   Physical Exam Vital signs noted  Well developed well nourished male.  HEENT - Head atraumatic Normocephalic                Eyes - PERRLA, Conjuctiva - clear Sclera- Clear EOMI                Ears - EAC's Wnl TM's Wnl Gross Hearing WNL                Throat - oropharanx wnl Respiratory - Lungs CTA bilateral Cardiac - RRR S1 and S2 without murmur GI - Abdomen soft Nontender and bowel sounds active x 4.       Assessment & Plan:  Acute bronchitis - Plan: amoxicillin (AMOXIL) 875 MG tablet, methylPREDNISolone acetate (DEPO-MEDROL) injection 80 mg. Push po fluids, rest, tylenol and motrin otc prn as directed for fever, arthralgias, and myalgias.  Follow up prn if sx's continue or persist.  Lysbeth Penner FNP

## 2013-03-30 ENCOUNTER — Other Ambulatory Visit: Payer: Self-pay | Admitting: General Practice

## 2013-04-19 ENCOUNTER — Telehealth: Payer: Self-pay | Admitting: Family Medicine

## 2013-04-19 NOTE — Telephone Encounter (Signed)
Pt wants appt for asthma. Advised no appts available for today. Would need to go to ER if needs appt today. Requesedt appt for tom. Appt made.

## 2013-04-20 ENCOUNTER — Ambulatory Visit (INDEPENDENT_AMBULATORY_CARE_PROVIDER_SITE_OTHER): Payer: BC Managed Care – PPO | Admitting: Family Medicine

## 2013-04-20 ENCOUNTER — Encounter: Payer: Self-pay | Admitting: Family Medicine

## 2013-04-20 VITALS — BP 132/85 | HR 67 | Temp 97.6°F | Ht 69.0 in | Wt 192.6 lb

## 2013-04-20 DIAGNOSIS — J45901 Unspecified asthma with (acute) exacerbation: Secondary | ICD-10-CM

## 2013-04-20 DIAGNOSIS — M25519 Pain in unspecified shoulder: Secondary | ICD-10-CM

## 2013-04-20 DIAGNOSIS — M25511 Pain in right shoulder: Secondary | ICD-10-CM

## 2013-04-20 MED ORDER — AMOXICILLIN 875 MG PO TABS: 875.0000 mg | ORAL_TABLET | Freq: Two times a day (BID) | ORAL | Status: DC

## 2013-04-20 MED ORDER — PREDNISONE 10 MG PO TABS
ORAL_TABLET | ORAL | Status: DC
Start: 2013-04-20 — End: 2013-06-13

## 2013-04-20 NOTE — Progress Notes (Signed)
   Subjective:    Patient ID: Johnny Mathews, male    DOB: [birth year], 53 y.o.   MRN: [id number]  HPI  This 53 y.o. male presents for evaluation of shortness of breath and night time coughing and  Wheezing.  He has been more SOB with exertion over the last few weeks and it is not getting Any better and he has been wheezing more at night.  He has been having some right shoulder discomfort.  Review of Systems C/o sob and right shoulder discomfort.   No chest pain, SOB, HA, dizziness, vision change, N/V, diarrhea, constipation, dysuria, urinary urgency or frequency, myalgias, arthralgias or rash.  Objective:   Physical Exam  Vital signs noted  Well developed well nourished male.  HEENT - Head atraumatic Normocephalic                Eyes - PERRLA, Conjuctiva - clear Sclera- Clear EOMI                Ears - EAC's Wnl TM's Wnl Gross Hearing WNL                Throat - oropharanx wnl Respiratory - Lungs CTA bilateral Cardiac - RRR S1 and S2 without murmur GI - Abdomen soft Nontender and bowel sounds active x 4 MS - TTP right shoulder  Procedure - Right shoulder injected in posterior fashion with one cc of kenalog 40mg  and one cc Of 1% lidocaine and patient tolerated well.  Site was cleansed with ETOH prior to injection.      Assessment & Plan:  Asthma with acute exacerbation - Plan: predniSONE (DELTASONE) 10 MG tablet, amoxicillin (AMOXIL) 875 MG tablet  Right shoulder pain - right posterior shoulder injection with one cc of kenalog and one cc of lidocaine 1%  Lysbeth Penner FNP

## 2013-05-23 ENCOUNTER — Other Ambulatory Visit: Payer: Self-pay | Admitting: Family Medicine

## 2013-06-13 ENCOUNTER — Encounter: Payer: Self-pay | Admitting: Family Medicine

## 2013-06-13 ENCOUNTER — Ambulatory Visit (INDEPENDENT_AMBULATORY_CARE_PROVIDER_SITE_OTHER): Payer: BC Managed Care – PPO | Admitting: Family Medicine

## 2013-06-13 VITALS — BP 135/80 | HR 77 | Temp 99.5°F | Ht 69.0 in | Wt 190.4 lb

## 2013-06-13 DIAGNOSIS — J069 Acute upper respiratory infection, unspecified: Secondary | ICD-10-CM

## 2013-06-13 DIAGNOSIS — R509 Fever, unspecified: Secondary | ICD-10-CM

## 2013-06-13 DIAGNOSIS — J029 Acute pharyngitis, unspecified: Secondary | ICD-10-CM

## 2013-06-13 LAB — POCT RAPID STREP A (OFFICE): Rapid Strep A Screen: NEGATIVE

## 2013-06-13 MED ORDER — AZITHROMYCIN 250 MG PO TABS: ORAL_TABLET | ORAL | Status: DC

## 2013-06-13 MED ORDER — METHYLPREDNISOLONE ACETATE 80 MG/ML IJ SUSP
80.0000 mg | Freq: Once | INTRAMUSCULAR | Status: AC
  Administered 2013-06-13: 80 mg via INTRAMUSCULAR

## 2013-06-13 NOTE — Progress Notes (Signed)
   Subjective:    Patient ID: MAKOA SATZ, male    DOB: [birth year], 53 y.o.   MRN: [id number]  HPI This 53 y.o. male presents for evaluation of uri sx's for over a week.   Review of Systems No chest pain, SOB, HA, dizziness, vision change, N/V, diarrhea, constipation, dysuria, urinary urgency or frequency, myalgias, arthralgias or rash.     Objective:   Physical Exam Vital signs noted  Well developed well nourished male.  HEENT - Head atraumatic Normocephalic                Eyes - PERRLA, Conjuctiva - clear Sclera- Clear EOMI                Ears - EAC's Wnl TM's Wnl Gross Hearing WNL                Throat - oropharanx injected Respiratory - Lungs CTA bilateral Cardiac - RRR S1 and S2 without murmur GI - Abdomen soft Nontender and bowel sounds active x 4 Extremities - No edema. Neuro - Grossly intact.       Assessment & Plan:  Sore throat - Plan: POCT rapid strep A, azithromycin (ZITHROMAX) 250 MG tablet, methylPREDNISolone acetate (DEPO-MEDROL) injection 80 mg  Fever - Plan: POCT rapid strep A, azithromycin (ZITHROMAX) 250 MG tablet, methylPREDNISolone acetate (DEPO-MEDROL) injection 80 mg  Acute URI - Plan: azithromycin (ZITHROMAX) 250 MG tablet, methylPREDNISolone acetate (DEPO-MEDROL) injection 80 mg  Push po fluids, rest, tylenol and motrin otc prn as directed for fever, arthralgias, and myalgias.  Follow up prn if sx's continue or persist.  Lysbeth Penner FNP

## 2013-06-17 ENCOUNTER — Other Ambulatory Visit: Payer: Self-pay | Admitting: Nurse Practitioner

## 2013-06-20 NOTE — Telephone Encounter (Signed)
This is okay to refill. Let patient know

## 2013-06-20 NOTE — Telephone Encounter (Signed)
Johnny Mathews pt. Last ov 06/13/13. Last refill 05/16/13. Cal to [facility] if approved. Route to pool A. Thanks.

## 2013-06-21 NOTE — Telephone Encounter (Signed)
Called in to [facility]

## 2013-07-14 ENCOUNTER — Telehealth: Payer: Self-pay | Admitting: Family Medicine

## 2013-07-14 NOTE — Telephone Encounter (Signed)
appt given for 7/15 with mmm

## 2013-07-20 ENCOUNTER — Ambulatory Visit (INDEPENDENT_AMBULATORY_CARE_PROVIDER_SITE_OTHER): Payer: BC Managed Care – PPO | Admitting: Family Medicine

## 2013-07-20 ENCOUNTER — Encounter: Payer: Self-pay | Admitting: Family Medicine

## 2013-07-20 VITALS — BP 123/78 | HR 69 | Temp 98.1°F | Resp 18 | Ht 69.0 in | Wt 188.4 lb

## 2013-07-20 DIAGNOSIS — R5381 Other malaise: Secondary | ICD-10-CM

## 2013-07-20 DIAGNOSIS — E785 Hyperlipidemia, unspecified: Secondary | ICD-10-CM

## 2013-07-20 DIAGNOSIS — G609 Hereditary and idiopathic neuropathy, unspecified: Secondary | ICD-10-CM

## 2013-07-20 DIAGNOSIS — R5383 Other fatigue: Secondary | ICD-10-CM

## 2013-07-20 DIAGNOSIS — E119 Type 2 diabetes mellitus without complications: Secondary | ICD-10-CM

## 2013-07-20 LAB — POCT GLYCOSYLATED HEMOGLOBIN (HGB A1C): Hemoglobin A1C: 4.9

## 2013-07-20 LAB — POCT CBC
Granulocyte percent: 68.9 %G (ref 37–80)
HCT, POC: 41.8 % — AB (ref 43.5–53.7)
Hemoglobin: 13.2 g/dL — AB (ref 14.1–18.1)
Lymph, poc: 1.3 (ref 0.6–3.4)
MCH, POC: 29.6 pg (ref 27–31.2)
MCHC: 31.7 g/dL — AB (ref 31.8–35.4)
MCV: 93.6 fL (ref 80–97)
MPV: 7.3 fL (ref 0–99.8)
POC Granulocyte: 3.7 (ref 2–6.9)
POC LYMPH PERCENT: 25.2 %L (ref 10–50)
Platelet Count, POC: 233 10*3/uL (ref 142–424)
RBC: 4.5 M/uL — AB (ref 4.69–6.13)
RDW, POC: 13.4 %
WBC: 5.3 10*3/uL (ref 4.6–10.2)

## 2013-07-20 MED ORDER — EXENATIDE ER 2 MG ~~LOC~~ SUSR: 2.0000 mg | SUBCUTANEOUS | Status: DC

## 2013-07-20 MED ORDER — GABAPENTIN 300 MG PO CAPS: 300.0000 mg | ORAL_CAPSULE | Freq: Three times a day (TID) | ORAL | Status: DC

## 2013-07-20 NOTE — Patient Instructions (Signed)
Continue current medications. Continue good therapeutic lifestyle changes which include good diet and exercise. Fall precautions discussed with patient. If an FOBT was given today- please return it to our front desk. If you are over 55 years old - you may need Prevnar 13 or the adult Pneumonia vaccine.                        

## 2013-07-20 NOTE — Progress Notes (Signed)
   Subjective:    Patient ID: Johnny Mathews, male    DOB: 02-15-1960, 53 y.o.   MRN: [id number]  HPI PT IS HERE TODAY FOR FOLLOW UP OF CHRONIC MEDICAL PROBLEMS WHICH INCLUDE HYPERLIPIDEMIA, DIABETES AND GERD.   Patient Active Problem List   Diagnosis Date Noted  . GERD (gastroesophageal reflux disease) 12/23/2012  . Hyperlipidemia LDL goal < 100 12/23/2012  . Type II or unspecified type diabetes mellitus without mention of complication, not stated as uncontrolled 12/23/2012  . Fluid retention 12/23/2012  . Asthma, mild intermittent 12/23/2012  . Muscular dystrophy 04/29/2012  . Right shoulder pain 03/02/2012   Outpatient Encounter Prescriptions as of 07/20/2013  Medication Sig  . albuterol (PROAIR HFA) 108 (90 BASE) MCG/ACT inhaler Inhale 2 puffs into the lungs every 4 (four) hours as needed for wheezing or shortness of breath.  Marland Kitchen amitriptyline (ELAVIL) 25 MG tablet TAKE 3 TABLETS BY MOUTH AT BEDTIME  . azelastine (ASTELIN) 0.1 % nasal spray   . Azelastine-Fluticasone (DYMISTA) 137-50 MCG/ACT SUSP Place 1 Bottle into the nose 2 (two) times daily.  Marland Kitchen EPINEPHrine (EPI-PEN) 0.3 mg/0.3 mL DEVI Inject 0.3 mg into the skin as needed.   Marland Kitchen esomeprazole (NEXIUM) 40 MG capsule Take 1 capsule (40 mg total) by mouth daily.  . Exenatide ER (BYDUREON) 2 MG SUSR Inject 2 mg into the skin once a week.  . fluticasone (FLONASE) 50 MCG/ACT nasal spray   . furosemide (LASIX) 20 MG tablet Take 1 tablet (20 mg total) by mouth daily.  Marland Kitchen glucose blood test strip Use as instructed  . mometasone-formoterol (DULERA) 100-5 MCG/ACT AERO Inhale 2 puffs into the lungs 2 (two) times daily.  . montelukast (SINGULAIR) 10 MG tablet Take 1 tablet (10 mg total) by mouth daily.  . pravastatin (PRAVACHOL) 40 MG tablet Take 1 tablet (40 mg total) by mouth daily.  . ranitidine (ZANTAC) 150 MG tablet TAKE ONE TABLET BY MOUTH TWICE DAILY  . [DISCONTINUED] amoxicillin (AMOXIL) 875 MG tablet Take 1 tablet (875 mg total) by mouth  2 (two) times daily.  . [DISCONTINUED] azithromycin (ZITHROMAX) 250 MG tablet Take 2po first day and then one po qd x 4 days  . [DISCONTINUED] traMADol (ULTRAM) 50 MG tablet Take 1 tablet (50 mg total) by mouth every 8 (eight) hours as needed.     Review of Systems     Objective:   Physical Exam BP 123/78  Pulse 69  Temp(Src) 98.1 F (36.7 C) (Oral)  Resp 18  Ht 5\' 9"  (1.753 m)  Wt 188 lb 6.4 oz (85.458 kg)  BMI 27.81 kg/m2        Assessment & Plan:  PT WILL CONTINUE TO WORK ON DIET AND EXERCISE TO GET HIS BMI LESS THAN 25 AND TO REDUCE LDL.

## 2013-07-20 NOTE — Addendum Note (Signed)
Addended by: Pollyann Kennedy F on: 07/20/2013 12:57 PM   Modules accepted: Orders

## 2013-07-20 NOTE — Progress Notes (Signed)
   Subjective:    Patient ID: KENDYL BISSONNETTE, male    DOB: 08-09-1960, 53 y.o.   MRN: [id number]  HPI  This 53 y.o. male presents for evaluation of follow up chronic medical problems, diabetes, and GERD.  Review of Systems    No chest pain, SOB, HA, dizziness, vision change, N/V, diarrhea, constipation, dysuria, urinary urgency or frequency, myalgias, arthralgias or rash.  Objective:   Physical Exam Vital signs noted  Well developed well nourished male.  HEENT - Head atraumatic Normocephalic                Eyes - PERRLA, Conjuctiva - clear Sclera- Clear EOMI                Ears - EAC's Wnl TM's Wnl Gross Hearing WNL                Nose - Nares patent                 Throat - oropharanx wnl Respiratory - Lungs CTA bilateral Cardiac - RRR S1 and S2 without murmur GI - Abdomen soft Nontender and bowel sounds active x 4 Extremities - No edema. Neuro - Grossly intact.       Assessment & Plan:  Diabetes type 2, controlled - Plan: POCT glycosylated hemoglobin (Hb A1C), Microalbumin, urine, CMP14+EGFR.  Discussed with patient that he needs to eat low carb diet and exercise 30 minutes a day and 5 days a week and lose weight.  He is told to do foot care and to examine feet daily.  He is encouraged to have BMI less than 25.  Hyperlipemia - Plan: CMP14+EGFR, Lipid panel  Other fatigue - Plan: POCT CBC  Follow up in 3 months  Lysbeth Penner FNP

## 2013-07-21 LAB — CMP14+EGFR
ALT: 20 IU/L (ref 0–44)
AST: 21 IU/L (ref 0–40)
Albumin/Globulin Ratio: 1.6 (ref 1.1–2.5)
Albumin: 4.2 g/dL (ref 3.5–5.5)
Alkaline Phosphatase: 106 IU/L (ref 39–117)
BUN/Creatinine Ratio: 13 (ref 9–20)
BUN: 15 mg/dL (ref 6–24)
CO2: 25 mmol/L (ref 18–29)
Calcium: 9.2 mg/dL (ref 8.7–10.2)
Chloride: 101 mmol/L (ref 97–108)
Creatinine, Ser: 1.16 mg/dL (ref 0.76–1.27)
GFR calc Af Amer: 81 mL/min/{1.73_m2} (ref >59)
GFR calc non Af Amer: 70 mL/min/{1.73_m2} (ref >59)
Globulin, Total: 2.6 g/dL (ref 1.5–4.5)
Glucose: 87 mg/dL (ref 65–99)
Potassium: 4.7 mmol/L (ref 3.5–5.2)
Sodium: 140 mmol/L (ref 134–144)
Total Bilirubin: 0.3 mg/dL (ref 0.0–1.2)
Total Protein: 6.8 g/dL (ref 6.0–8.5)

## 2013-07-21 LAB — LIPID PANEL
Chol/HDL Ratio: 3.1 ratio units (ref 0.0–5.0)
Cholesterol, Total: 162 mg/dL (ref 100–199)
HDL: 53 mg/dL (ref >39)
LDL Calculated: 93 mg/dL (ref 0–99)
Triglycerides: 79 mg/dL (ref 0–149)
VLDL Cholesterol Cal: 16 mg/dL (ref 5–40)

## 2013-07-29 ENCOUNTER — Other Ambulatory Visit: Payer: Self-pay | Admitting: Nurse Practitioner

## 2013-09-16 ENCOUNTER — Encounter: Payer: Self-pay | Admitting: Family

## 2013-09-16 ENCOUNTER — Ambulatory Visit (INDEPENDENT_AMBULATORY_CARE_PROVIDER_SITE_OTHER): Payer: BC Managed Care – PPO | Admitting: Family

## 2013-09-16 VITALS — BP 136/81 | HR 66 | Temp 98.3°F | Ht 69.0 in | Wt 186.4 lb

## 2013-09-16 DIAGNOSIS — M25511 Pain in right shoulder: Secondary | ICD-10-CM

## 2013-09-16 DIAGNOSIS — M25519 Pain in unspecified shoulder: Secondary | ICD-10-CM

## 2013-09-16 MED ORDER — KETOROLAC TROMETHAMINE 60 MG/2ML IM SOLN
60.0000 mg | Freq: Once | INTRAMUSCULAR | Status: AC
  Administered 2013-09-16: 60 mg via INTRAMUSCULAR

## 2013-09-16 MED ORDER — MELOXICAM 15 MG PO TABS: 15.0000 mg | ORAL_TABLET | Freq: Every day | ORAL | Status: DC

## 2013-09-16 NOTE — Progress Notes (Signed)
   Subjective:    Patient ID: Johnny Mathews, male    DOB: January 13, 1960, 53 y.o.   MRN: [id number]  Shoulder Pain  The pain is present in the right shoulder. This is a chronic problem. The current episode started more than 1 year ago. There has been no history of extremity trauma. The problem occurs constantly. The problem has been waxing and waning. The quality of the pain is described as aching. The pain is at a severity of 8/10. The pain is moderate. Associated symptoms include stiffness. Pertinent negatives include no inability to bear weight, limited range of motion, numbness or tingling. The symptoms are aggravated by activity. He has tried nothing for the symptoms. The treatment provided no relief.      Review of Systems  Constitutional: Negative.   HENT: Negative.   Respiratory: Negative.   Cardiovascular: Negative.   Gastrointestinal: Negative.   Endocrine: Negative.   Genitourinary: Negative.   Musculoskeletal: Positive for stiffness.  Neurological: Negative.  Negative for tingling and numbness.  Hematological: Negative.   Psychiatric/Behavioral: Negative.   All other systems reviewed and are negative.      Objective:   Physical Exam  Vitals reviewed. Constitutional: He is oriented to person, place, and time. He appears well-developed and well-nourished. No distress.  Eyes: Pupils are equal, round, and reactive to light. Right eye exhibits no discharge. Left eye exhibits no discharge.  Neck: Normal range of motion. Neck supple. No thyromegaly present.  Cardiovascular: Normal rate, regular rhythm, normal heart sounds and intact distal pulses.   No murmur heard. Pulmonary/Chest: Effort normal and breath sounds normal. No respiratory distress. He has no wheezes.  Abdominal: Soft. Bowel sounds are normal. He exhibits no distension. There is no tenderness.  Musculoskeletal: Normal range of motion. He exhibits no edema and no tenderness.  Full rom of right shoulder-Movements  slow r/t to pain   Neurological: He is alert and oriented to person, place, and time. He has normal reflexes. No cranial nerve deficit.  Skin: Skin is warm and dry. No rash noted. No erythema.  Psychiatric: He has a normal mood and affect. His behavior is normal. Judgment and thought content normal.    BP 136/81  Pulse 66  Temp(Src) 98.3 F (36.8 C) (Oral)  Ht _0  (1.753 m)  Wt 186 lb 6.4 oz (84.55 kg)  BMI 27.51 kg/m2       Assessment & Plan:  1. Right shoulder pain -Ice and heat if helps -Rest -NO other NSAID's while taking mobic, tylenol prn  - CMP14+EGFR - Uric acid - Ambulatory referral to Orthopedic Surgery - Arthritis Panel - ketorolac (TORADOL) injection 60 mg; Inject 2 mLs (60 mg total) into the muscle once. - meloxicam (MOBIC) 15 MG tablet; Take 1 tablet (15 mg total) by mouth daily.  Dispense: 30 tablet; Refill: 0   Evelina Dun, FNP

## 2013-09-16 NOTE — Patient Instructions (Signed)

## 2013-09-17 LAB — CMP14+EGFR
ALT: 22 IU/L (ref 0–44)
AST: 26 IU/L (ref 0–40)
Albumin/Globulin Ratio: 2 (ref 1.1–2.5)
Albumin: 4.8 g/dL (ref 3.5–5.5)
Alkaline Phosphatase: 105 IU/L (ref 39–117)
BILIRUBIN TOTAL: 0.4 mg/dL (ref 0.0–1.2)
BUN/Creatinine Ratio: 13 (ref 9–20)
BUN: 16 mg/dL (ref 6–24)
CO2: 23 mmol/L (ref 18–29)
CREATININE: 1.24 mg/dL (ref 0.76–1.27)
Calcium: 9.5 mg/dL (ref 8.7–10.2)
Chloride: 101 mmol/L (ref 97–108)
GFR, EST AFRICAN AMERICAN: 75 mL/min/{1.73_m2} (ref >59)
GFR, EST NON AFRICAN AMERICAN: 65 mL/min/{1.73_m2} (ref >59)
GLOBULIN, TOTAL: 2.4 g/dL (ref 1.5–4.5)
Glucose: 85 mg/dL (ref 65–99)
POTASSIUM: 4.4 mmol/L (ref 3.5–5.2)
Sodium: 140 mmol/L (ref 134–144)
TOTAL PROTEIN: 7.2 g/dL (ref 6.0–8.5)

## 2013-09-17 LAB — ARTHRITIS PANEL
BASOS ABS: 0 10*3/uL (ref 0.0–0.2)
Basos: 0 %
EOS ABS: 0.4 10*3/uL (ref 0.0–0.4)
Eos: 6 %
HEMATOCRIT: 38.6 % (ref 37.5–51.0)
Hemoglobin: 12.6 g/dL (ref 12.6–17.7)
IMMATURE GRANULOCYTES: 0 %
Immature Grans (Abs): 0 10*3/uL (ref 0.0–0.1)
LYMPHS: 24 %
Lymphocytes Absolute: 1.8 10*3/uL (ref 0.7–3.1)
MCH: 30.4 pg (ref 26.6–33.0)
MCHC: 32.6 g/dL (ref 31.5–35.7)
MCV: 93 fL (ref 79–97)
MONOCYTES: 10 %
Monocytes Absolute: 0.7 10*3/uL (ref 0.1–0.9)
Neutrophils Absolute: 4.4 10*3/uL (ref 1.4–7.0)
Neutrophils Relative %: 60 %
PLATELETS: 313 10*3/uL (ref 150–379)
RBC: 4.14 x10E6/uL (ref 4.14–5.80)
RDW: 13.4 % (ref 12.3–15.4)
RHEUMATOID FACTOR: 7.7 [IU]/mL (ref 0.0–13.9)
SED RATE: 9 mm/h (ref 0–30)
URIC ACID: 5.4 mg/dL (ref 3.7–8.6)
WBC: 7.4 10*3/uL (ref 3.4–10.8)

## 2013-09-21 ENCOUNTER — Telehealth: Payer: Self-pay | Admitting: Family

## 2013-09-21 NOTE — Telephone Encounter (Signed)
Message copied by Cline Crock on Wed Sep 21, 2013  9:43 AM ------      Message from: Lenna Gilford, [place] A      Created: Mon Sep 19, 2013 11:53 AM       Kidney and liver function stable      Arthritis Panel (Urinc acid, WBC, Hgb, Plts, & Rheumatoid factor)- WNL ------

## 2013-09-21 NOTE — Telephone Encounter (Signed)
Patient aware.

## 2013-09-26 ENCOUNTER — Encounter: Payer: Self-pay | Admitting: *Deleted

## 2013-10-24 ENCOUNTER — Other Ambulatory Visit: Payer: Self-pay | Admitting: Family Medicine

## 2013-10-26 ENCOUNTER — Encounter: Payer: Self-pay | Admitting: Sports Medicine

## 2013-10-26 ENCOUNTER — Ambulatory Visit (INDEPENDENT_AMBULATORY_CARE_PROVIDER_SITE_OTHER): Payer: BC Managed Care – PPO | Admitting: Sports Medicine

## 2013-10-26 VITALS — BP 161/75 | HR 56 | Ht 69.0 in | Wt 189.0 lb

## 2013-10-26 DIAGNOSIS — S83241A Other tear of medial meniscus, current injury, right knee, initial encounter: Secondary | ICD-10-CM

## 2013-10-26 DIAGNOSIS — M7541 Impingement syndrome of right shoulder: Secondary | ICD-10-CM

## 2013-10-26 DIAGNOSIS — M19011 Primary osteoarthritis, right shoulder: Secondary | ICD-10-CM

## 2013-10-26 DIAGNOSIS — S83206A Unspecified tear of unspecified meniscus, current injury, right knee, initial encounter: Secondary | ICD-10-CM | POA: Insufficient documentation

## 2013-10-26 DIAGNOSIS — M25511 Pain in right shoulder: Secondary | ICD-10-CM | POA: Diagnosis not present

## 2013-10-26 DIAGNOSIS — M129 Arthropathy, unspecified: Secondary | ICD-10-CM | POA: Diagnosis not present

## 2013-10-26 DIAGNOSIS — M75101 Unspecified rotator cuff tear or rupture of right shoulder, not specified as traumatic: Secondary | ICD-10-CM

## 2013-10-26 MED ORDER — TRIAMCINOLONE ACETONIDE 10 MG/ML IJ SUSP
10.0000 mg | Freq: Once | INTRAMUSCULAR | Status: AC
  Administered 2013-10-26: 10 mg via INTRA_ARTICULAR

## 2013-10-26 MED ORDER — METHYLPREDNISOLONE ACETATE 40 MG/ML IJ SUSP
40.0000 mg | Freq: Once | INTRAMUSCULAR | Status: AC
  Administered 2013-10-26: 40 mg via INTRA_ARTICULAR

## 2013-10-26 NOTE — Progress Notes (Signed)
Subjective:    Patient ID: Johnny Mathews, male    DOB: 1960/05/06, 53 y.o.   MRN: [id number]  HPI Johnny Mathews Is a 53 year old right-hand-dominant male who presents for followup of right shoulder pain and with also right knee pain.  She was previously seen over a year ago for the right shoulder pain and found to have a partial supraspinatus tear, successfully treated with right shoulder glycerin protocol and rehabilitation exercises.  He says that about one year ago his right shoulder pain had returned.  Over the past year he has noticed intermittent pain in the posterior superior portion as well as anteromedial portion of the right shoulder.  He describes a somewhat aching deep pain.  Symptoms are aggravated with reaching overhead or behind him.  He denies any new acute injury.  He denies any neck symptoms, numbness, tingling, or radiation of pain.  The right knee started hurting him approximately one week ago without a known acute injury.  Location of pain is primarily along the medial joint line.  He notes some associated popping and catching in the right knee.  He has tried wearing an over-the-counter knee sleeve compression brace with open patella.  He has a history of 2 prior arthroscopic surgeries for meniscal debridement on the right.  He denies any associated swelling, giving way, fever, or chills.  He has no history of gout.  Past medical history, social history, medications, and allergies were reviewed and are up to date in the chart.  Review of Systems 7 point review of systems was performed and was otherwise negative unless noted in the history of present illness.    Objective:   Physical Exam BP 161/75  Pulse 56  Ht 5\' 9"  (1.753 m)  Wt 189 lb (85.73 kg)  BMI 27.90 kg/m2 GEN: The patient is well-developed well-nourished male and in no acute distress.  He is awake alert and oriented x3. SKIN: warm and well-perfused, no rash  EXTR: No lower extremity edema or calf  tenderness Neuro: Strength 5/5 globally. Sensation intact throughout. DTRs 2/4 bilaterally. No focal deficits. Vasc: +2 bilateral distal pulses. No edema.  MSK: Examination of the right knee reveals full range of motion but is relatively pain-free.  He has medial joint line tenderness to palpation.  Positive McMurray's right knee.  Negative Lachman's.  No valgus or varus laxity.  Equivocal patellar grind test.  Examination of the right shoulder reveals tenderness to palpation over the acromioclavicular joint with crossover testing.  He has relatively full range of motion in the right shoulder.  Jobes test positive.  He has impingement signs of the right shoulder as well with Luan Pulling and Neer testing.  Negative sag sign.  Negative speeds.  Negative abdominal compression test.  X-ray 01/2013: subchondral cysts of the right humeral head, mild degenerative changes.  Limited musculoskeletal ultrasound: Long and short axis views were obtained of the patient's right knee and right shoulder.  The right knee has what appears to be a normal amount of physiologic fluid.  The region of the mid medial malleolus that has a most tenderness also shows a protrusion of the medial malleolus with some surrounding fluid edema.  The quadriceps and patellar tendons appear intact.  Examination of the right shoulder reveals acromioclavicular joint arthritis.  The biceps tendon appears normal.  The subscapularis, supraspinatus, infraspinatus, and teres minor appear normal.  He does have signs of irritation of the supraspinatus bursa, as it appears to be enlarged.  Procedure Right Subacromial Injection:  The procedure was explained to the patient in detail, all questions were answered.  After obtaining informed verbal consent, the right shoulder was sterilely prepped with alcohol. Using strict sterile technique, a 25 gauge, 1  inch needle was inserted inferior to the posterolateral edge of the right acromion.  The needle is directed  medially and slightly anteriorly.  A mixture of 3 mL 1% Xylocaine and 40mg  kenalog was injected without resistance. The needle was removed, and a sterile bandage was applied. The patient tolerated the procedure well and was observed for 5-10 minutes.  Post injection instructions, including signs and symptoms of complications were discussed.  Procedure Right AC Joint Injection:After obtaining verbal consent the skin was cleansed with alcohol and Betadine.  A 1-1 mixture of 10mg  kenalog and 1cc of 1% lidocaine plain was subsequently injected into the right a.c. joint using a 25-gauge short needle under ultrasound guidance. The medication was visualized filling the AC joint capsule.  The patient tolerated the procedure.  No complications.  Prior to the procedure risks, benefits and treatment alternatives were discussed.    Assessment & Plan:  Please see problem based assessment and plan in the problem list.

## 2013-10-26 NOTE — Assessment & Plan Note (Addendum)
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 -Subacromial injection and AC joint injections today helped -Therapeutic exercises for shoulder given to start about 5 days after injections -Follow-up in 3 months or sooner if needed

## 2013-10-26 NOTE — Assessment & Plan Note (Signed)
-  Avoid knee impact activities for now -Recommend recumbent bike for exercise -Continue compression sleeve -Follow-up if needed

## 2013-11-01 ENCOUNTER — Ambulatory Visit (INDEPENDENT_AMBULATORY_CARE_PROVIDER_SITE_OTHER): Payer: BC Managed Care – PPO

## 2013-11-01 ENCOUNTER — Encounter: Payer: Self-pay | Admitting: Family Medicine

## 2013-11-01 ENCOUNTER — Ambulatory Visit (INDEPENDENT_AMBULATORY_CARE_PROVIDER_SITE_OTHER): Payer: BC Managed Care – PPO | Admitting: Family Medicine

## 2013-11-01 VITALS — BP 142/73 | HR 65 | Temp 98.0°F | Ht 69.0 in | Wt 189.0 lb

## 2013-11-01 DIAGNOSIS — M25561 Pain in right knee: Secondary | ICD-10-CM

## 2013-11-01 DIAGNOSIS — M25511 Pain in right shoulder: Secondary | ICD-10-CM

## 2013-11-01 MED ORDER — MELOXICAM 15 MG PO TABS: 15.0000 mg | ORAL_TABLET | Freq: Every day | ORAL | Status: DC

## 2013-11-01 NOTE — Progress Notes (Signed)
   Subjective:    Patient ID: Johnny Mathews, male    DOB: March 31, 1960, 53 y.o.   MRN: [id number]  HPI C/o right knee discomfort.  He has been having a click of the right knee for some time and when he went to see a sports medicine doctor he was told he should wear a brace and it would help.  He has  Been wearing the brace and it is still clicking.   Review of Systems    No chest pain, SOB, HA, dizziness, vision change, N/V, diarrhea, constipation, dysuria, urinary urgency or frequency or rash.  Objective:   Physical Exam   Right Knee - Negative drawer and negative valgus or varus strain Crepitus with extension and flexion      Assessment & Plan:  Right knee pain - Plan: DG Knee 1-2 Views Right, Ambulatory referral to Orthopedic Surgery  Right shoulder pain - Plan: meloxicam (MOBIC) 15 MG tablet  Lysbeth Penner FNP

## 2013-11-04 ENCOUNTER — Encounter: Payer: BC Managed Care – PPO | Admitting: Family Medicine

## 2013-11-10 ENCOUNTER — Ambulatory Visit (INDEPENDENT_AMBULATORY_CARE_PROVIDER_SITE_OTHER): Payer: BC Managed Care – PPO | Admitting: Family Medicine

## 2013-11-10 ENCOUNTER — Encounter: Payer: Self-pay | Admitting: Family Medicine

## 2013-11-10 VITALS — BP 131/81 | HR 68 | Temp 99.0°F | Ht 69.0 in | Wt 187.0 lb

## 2013-11-10 DIAGNOSIS — E119 Type 2 diabetes mellitus without complications: Secondary | ICD-10-CM

## 2013-11-10 DIAGNOSIS — E785 Hyperlipidemia, unspecified: Secondary | ICD-10-CM

## 2013-11-10 DIAGNOSIS — Z0000 Encounter for general adult medical examination without abnormal findings: Secondary | ICD-10-CM

## 2013-11-10 DIAGNOSIS — J4520 Mild intermittent asthma, uncomplicated: Secondary | ICD-10-CM

## 2013-11-10 LAB — POCT GLYCOSYLATED HEMOGLOBIN (HGB A1C): Hemoglobin A1C: 5.1

## 2013-11-10 MED ORDER — LOSARTAN POTASSIUM 50 MG PO TABS: 50.0000 mg | ORAL_TABLET | Freq: Every day | ORAL | Status: DC

## 2013-11-10 NOTE — Progress Notes (Signed)
   Subjective:    Patient ID: Johnny Mathews, male    DOB: [birth year], 53 y.o.   MRN: [id number]  HPI 53 year old here for an annual physical. 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 He is followed by Dr. It [facility] for that. Also has chronic acid issues and takes both a PPI and H2 antagonist. There is some neuropathy but he is unable to take Neurontin due to side effects.    Review of Systems  Constitutional: Negative.   HENT: Negative.   Eyes: Negative.   Respiratory: Negative.  Negative for shortness of breath.   Cardiovascular: Negative.  Negative for chest pain and leg swelling.  Gastrointestinal: Negative.   Genitourinary: Negative.   Musculoskeletal: Negative.   Skin: Negative.   Neurological: Negative.   Psychiatric/Behavioral: Negative.   All other systems reviewed and are negative.      Objective:   Physical Exam  Constitutional: He is oriented to person, place, and time. He appears well-developed and well-nourished.  HENT:  Head: Normocephalic.  Right Ear: External ear normal.  Left Ear: External ear normal.  Nose: Nose normal.  Mouth/Throat: Oropharynx is clear and moist.  Eyes: Conjunctivae and EOM are normal. Pupils are equal, round, and reactive to light.  Neck: Normal range of motion. Neck supple.  Cardiovascular: Normal rate, regular rhythm, normal heart sounds and intact distal pulses.   Pulmonary/Chest: Effort normal and breath sounds normal.  Abdominal: Soft. Bowel sounds are normal.  Genitourinary: Prostate normal.  Musculoskeletal: Normal range of motion.  Neurological: He is alert and oriented to person, place, and time.  Reflexes are depressed or absent bilaterally symmetric  Skin: Skin is warm and dry.  Psychiatric: He has a normal mood and affect. His behavior is normal. Judgment and thought content normal.          Assessment & Plan:  1. Asthma, mild intermittent,  uncomplicated   2. Hyperlipidemia with target LDL less than 100 There may be some statin intolerance but is not resistant to trying atorvastatin once or twice a week - CMP14+EGFR - Lipid panel  3. Routine general medical examination at a health care facility  - PSA, total and free  4. Type 2 diabetes mellitus without complication  - POCT glycosylated hemoglobin (Hb A1C)  Wardell Honour MD

## 2013-11-11 LAB — PSA, TOTAL AND FREE
PSA, Free Pct: 20 %
PSA, Free: 0.28 ng/mL
PSA: 1.4 ng/mL (ref 0.0–4.0)

## 2013-11-11 LAB — LIPID PANEL
CHOL/HDL RATIO: 2.8 ratio (ref 0.0–5.0)
Cholesterol, Total: 167 mg/dL (ref 100–199)
HDL: 60 mg/dL (ref >39)
LDL Calculated: 96 mg/dL (ref 0–99)
Triglycerides: 57 mg/dL (ref 0–149)
VLDL Cholesterol Cal: 11 mg/dL (ref 5–40)

## 2013-11-11 LAB — CMP14+EGFR
A/G RATIO: 1.4 (ref 1.1–2.5)
ALK PHOS: 100 IU/L (ref 39–117)
ALT: 21 IU/L (ref 0–44)
AST: 24 IU/L (ref 0–40)
Albumin: 4.2 g/dL (ref 3.5–5.5)
BUN / CREAT RATIO: 16 (ref 9–20)
BUN: 20 mg/dL (ref 6–24)
CO2: 22 mmol/L (ref 18–29)
Calcium: 9.1 mg/dL (ref 8.7–10.2)
Chloride: 102 mmol/L (ref 97–108)
Creatinine, Ser: 1.25 mg/dL (ref 0.76–1.27)
GFR calc Af Amer: 74 mL/min/{1.73_m2} (ref >59)
GFR, EST NON AFRICAN AMERICAN: 64 mL/min/{1.73_m2} (ref >59)
GLOBULIN, TOTAL: 2.9 g/dL (ref 1.5–4.5)
Glucose: 87 mg/dL (ref 65–99)
Potassium: 4.3 mmol/L (ref 3.5–5.2)
Sodium: 141 mmol/L (ref 134–144)
Total Bilirubin: 0.3 mg/dL (ref 0.0–1.2)
Total Protein: 7.1 g/dL (ref 6.0–8.5)

## 2013-11-25 ENCOUNTER — Other Ambulatory Visit: Payer: Self-pay | Admitting: Family Medicine

## 2013-12-07 ENCOUNTER — Encounter: Payer: Self-pay | Admitting: Sports Medicine

## 2013-12-07 ENCOUNTER — Ambulatory Visit (INDEPENDENT_AMBULATORY_CARE_PROVIDER_SITE_OTHER): Payer: BC Managed Care – PPO | Admitting: Sports Medicine

## 2013-12-07 VITALS — BP 133/83

## 2013-12-07 DIAGNOSIS — S83206A Unspecified tear of unspecified meniscus, current injury, right knee, initial encounter: Secondary | ICD-10-CM

## 2013-12-07 DIAGNOSIS — M25511 Pain in right shoulder: Secondary | ICD-10-CM

## 2013-12-07 NOTE — Progress Notes (Signed)
Patient ID: Johnny Mathews, male   DOB: 06/28/1960, 53 y.o.   MRN: [id number]  Patient comes back in followup for 2 issues First of these is right shoulder pain Injection the last visit After 2 days his pain started going away now he says this is almost back to normal He is having no pain with activities of daily living and that includes a lifting  Second problem is right knee pain On musculoskeletal ultrasound it appears that he has a degenerative meniscus He was using a compression sleeve which is somewhat helpful The knee is painful almost every day He gets pain with squats or any deep bend of his knee He gets pain with walking up or down steps  Physical exam No acute distress BP 133/83 mmHg   RT Shoulder: Inspection reveals no abnormalities, atrophy or asymmetry. He does tend to roll his shoulders inward Palpation is normal with no tenderness over AC joint or bicipital groove. ROM is full in all planes. Rotator cuff strength normal throughout. No signs of impingement with negative Neer and Hawkin's tests, empty can. Speeds and Yergason's tests normal. No labral pathology noted with negative Obrien's, negative clunk and good stability. Normal scapular function observed. No painful arc and no drop arm sign. No apprehension sign  Right knee Full extension Pain at 130 of flexion Pain with McMurray Pain with standing Thessaly Ligaments stable None apparent effusion today

## 2013-12-07 NOTE — Assessment & Plan Note (Signed)
We will send him to [facility] for physical therapy  I would like him to try this for 6-8 weeks  Continue using compression  Use Aleve for pain  If fails to improve with conservative therapy we'll do further work up

## 2013-12-07 NOTE — Assessment & Plan Note (Signed)
Much improved after corticosteroid injection  Continue with some of the simple rotator cuff exercises  Recheck as needed

## 2013-12-12 ENCOUNTER — Other Ambulatory Visit: Payer: Self-pay | Admitting: Family Medicine

## 2013-12-13 ENCOUNTER — Telehealth: Payer: Self-pay | Admitting: Family Medicine

## 2013-12-13 NOTE — Telephone Encounter (Signed)
Appointment giv.en for tomorrow with Bill at 11:45

## 2013-12-14 ENCOUNTER — Ambulatory Visit (INDEPENDENT_AMBULATORY_CARE_PROVIDER_SITE_OTHER): Payer: BC Managed Care – PPO | Admitting: Family Medicine

## 2013-12-14 ENCOUNTER — Encounter: Payer: Self-pay | Admitting: Family Medicine

## 2013-12-14 VITALS — BP 138/84 | HR 71 | Temp 97.8°F | Ht 69.0 in | Wt 188.0 lb

## 2013-12-14 DIAGNOSIS — M5440 Lumbago with sciatica, unspecified side: Secondary | ICD-10-CM

## 2013-12-14 MED ORDER — CYCLOBENZAPRINE HCL 10 MG PO TABS: 10.0000 mg | ORAL_TABLET | Freq: Three times a day (TID) | ORAL | Status: DC | PRN

## 2013-12-14 MED ORDER — NAPROXEN 500 MG PO TABS: 500.0000 mg | ORAL_TABLET | Freq: Two times a day (BID) | ORAL | Status: DC

## 2013-12-14 NOTE — Progress Notes (Signed)
   Subjective:    Patient ID: Johnny Mathews, male    DOB: 21-Jul-1960, 53 y.o.   MRN: [id number]  HPI Patient is here with c/o left lower back pain.  Review of Systems  Constitutional: Negative for fever.  HENT: Negative for ear pain.   Eyes: Negative for discharge.  Respiratory: Negative for cough.   Cardiovascular: Negative for chest pain.  Gastrointestinal: Negative for abdominal distention.  Endocrine: Negative for polyuria.  Genitourinary: Negative for difficulty urinating.  Musculoskeletal: Negative for gait problem and neck pain.  Skin: Negative for color change and rash.  Neurological: Negative for speech difficulty and headaches.  Psychiatric/Behavioral: Negative for agitation.       Objective:    BP 138/84 mmHg  Pulse 71  Temp(Src) 97.8 F (36.6 C) (Oral)  Ht 5\' 9"  (1.753 m)  Wt 188 lb (85.276 kg)  BMI 27.75 kg/m2 Physical Exam  Constitutional: He is oriented to person, place, and time. He appears well-developed and well-nourished.  HENT:  Head: Normocephalic and atraumatic.  Mouth/Throat: Oropharynx is clear and moist.  Eyes: Pupils are equal, round, and reactive to light.  Neck: Normal range of motion. Neck supple.  Cardiovascular: Normal rate and regular rhythm.   No murmur heard. Pulmonary/Chest: Effort normal and breath sounds normal.  Abdominal: Soft. Bowel sounds are normal. There is no tenderness.  Neurological: He is alert and oriented to person, place, and time.  Skin: Skin is warm and dry.  Psychiatric: He has a normal mood and affect.          Assessment & Plan:     ICD-9-CM ICD-10-CM   1. Low back pain with sciatica, sciatica laterality unspecified, unspecified back pain laterality 724.3 M54.40 naproxen (NAPROSYN) 500 MG tablet     cyclobenzaprine (FLEXERIL) 10 MG tablet     No Follow-up on file.  Lysbeth Penner FNP

## 2013-12-23 ENCOUNTER — Encounter: Payer: BC Managed Care – PPO | Admitting: Family Medicine

## 2013-12-26 ENCOUNTER — Ambulatory Visit (INDEPENDENT_AMBULATORY_CARE_PROVIDER_SITE_OTHER): Payer: BC Managed Care – PPO | Admitting: Family Medicine

## 2013-12-26 ENCOUNTER — Encounter: Payer: Self-pay | Admitting: Family Medicine

## 2013-12-26 VITALS — BP 146/103 | HR 65 | Temp 97.8°F | Ht 71.0 in | Wt 190.0 lb

## 2013-12-26 DIAGNOSIS — J4520 Mild intermittent asthma, uncomplicated: Secondary | ICD-10-CM

## 2013-12-26 MED ORDER — TRIAMCINOLONE ACETONIDE 40 MG/ML IJ SUSP
40.0000 mg | Freq: Once | INTRAMUSCULAR | Status: AC
  Administered 2013-12-26: 40 mg via INTRAMUSCULAR

## 2013-12-26 MED ORDER — PREDNISONE 20 MG PO TABS
40.0000 mg | ORAL_TABLET | Freq: Every day | ORAL | Status: DC
Start: 2013-12-26 — End: 2014-01-13

## 2013-12-26 NOTE — Progress Notes (Signed)
   Subjective:    Patient ID: Johnny Mathews, male    DOB: November 11, 1960, 53 y.o.   MRN: [id number]  HPI 53 year old man with history of asthma, diabetes, hypertension. States that he was exposed to excessive dust recently which cause a flare in his allergies which then resulted in a flare of his asthma. Current medications include Dulera, albuterol in several allergy medicines including Flonase and Astelin. He has not really had cough congestion.    Review of Systems  Constitutional: Negative.   HENT: Negative.   Eyes: Negative.   Respiratory: Positive for chest tightness and wheezing. Negative for shortness of breath.   Cardiovascular: Negative.  Negative for chest pain and leg swelling.  Gastrointestinal: Negative.   Genitourinary: Negative.   Musculoskeletal: Negative.   Skin: Negative.   Neurological: Negative.   Psychiatric/Behavioral: Negative.   All other systems reviewed and are negative.      Objective:   Physical Exam  Cardiovascular: Normal rate and regular rhythm.   Pulmonary/Chest: Effort normal and breath sounds normal. No respiratory distress.   BP 146/103 mmHg  Pulse 65  Temp(Src) 97.8 F (36.6 C) (Oral)  Ht 5\' 11"  (1.803 m)  Wt 190 lb (86.183 kg)  BMI 26.51 kg/m2       Assessment & Plan:  1. Asthma, mild intermittent, uncomplicated No wheezes today and good air movement, but pt knows his sx and I respect his history Rx: Kenalog, 40 mg and prednisone 40 mg x 5 days  Wardell Honour MD

## 2014-01-11 ENCOUNTER — Other Ambulatory Visit: Payer: Self-pay | Admitting: Family Medicine

## 2014-01-11 ENCOUNTER — Other Ambulatory Visit: Payer: Self-pay | Admitting: Nurse Practitioner

## 2014-01-13 ENCOUNTER — Ambulatory Visit (INDEPENDENT_AMBULATORY_CARE_PROVIDER_SITE_OTHER): Payer: BLUE CROSS/BLUE SHIELD | Admitting: Family Medicine

## 2014-01-13 ENCOUNTER — Ambulatory Visit (INDEPENDENT_AMBULATORY_CARE_PROVIDER_SITE_OTHER): Payer: BLUE CROSS/BLUE SHIELD

## 2014-01-13 VITALS — BP 153/93 | HR 75 | Temp 97.3°F | Ht 71.0 in | Wt 192.0 lb

## 2014-01-13 DIAGNOSIS — I10 Essential (primary) hypertension: Secondary | ICD-10-CM

## 2014-01-13 DIAGNOSIS — J45901 Unspecified asthma with (acute) exacerbation: Secondary | ICD-10-CM

## 2014-01-13 DIAGNOSIS — R0602 Shortness of breath: Secondary | ICD-10-CM

## 2014-01-13 MED ORDER — LOSARTAN POTASSIUM-HCTZ 100-12.5 MG PO TABS: 1.0000 | ORAL_TABLET | Freq: Every day | ORAL | Status: DC

## 2014-01-13 MED ORDER — PREDNISONE 10 MG PO TABS: ORAL_TABLET | ORAL | Status: DC

## 2014-01-13 MED ORDER — ALBUTEROL SULFATE HFA 108 (90 BASE) MCG/ACT IN AERS: 2.0000 | INHALATION_SPRAY | RESPIRATORY_TRACT | Status: DC | PRN

## 2014-01-13 MED ORDER — AZITHROMYCIN 250 MG PO TABS: ORAL_TABLET | ORAL | Status: DC

## 2014-01-13 NOTE — Progress Notes (Signed)
   Subjective:    Patient ID: Johnny Mathews, male    DOB: [birth year], 54 y.o.   MRN: [id number]  HPI Patient c/o SOB especially when sleeping at night.  He awakens with dyspnea and he feels more SOB recently.  He has been having elevated bp reasings.  He has hx of HTN.  He has hx of asthma.  He cannot tolerate inhaled corticosteroids.  He is on Dulera and albuterol SABA which does help he states. He is taking singulair. He has been having bp taken at [employer] and it has been consistently running 160/100.  Review of Systems  Constitutional: Negative for fever.  HENT: Negative for ear pain.   Eyes: Negative for discharge.  Respiratory: Negative for cough.   Cardiovascular: Negative for chest pain.  Gastrointestinal: Negative for abdominal distention.  Endocrine: Negative for polyuria.  Genitourinary: Negative for difficulty urinating.  Musculoskeletal: Negative for gait problem and neck pain.  Skin: Negative for color change and rash.  Neurological: Negative for speech difficulty and headaches.  Psychiatric/Behavioral: Negative for agitation.       Objective:    BP 153/93 mmHg  Pulse 75  Temp(Src) 97.3 F (36.3 C) (Oral)  Ht 5\' 11"  (1.803 m)  Wt 192 lb (87.091 kg)  BMI 26.79 kg/m2 Physical Exam  Constitutional: He is oriented to person, place, and time. He appears well-developed and well-nourished.  HENT:  Head: Normocephalic and atraumatic.  Mouth/Throat: Oropharynx is clear and moist.  Eyes: Pupils are equal, round, and reactive to light.  Neck: Normal range of motion. Neck supple.  Cardiovascular: Normal rate and regular rhythm.   No murmur heard. Pulmonary/Chest: Effort normal and breath sounds normal.  Abdominal: Soft. Bowel sounds are normal. There is no tenderness.  Neurological: He is alert and oriented to person, place, and time.  Skin: Skin is warm and dry.  Psychiatric: He has a normal mood and affect.          Assessment & Plan:     ICD-9-CM  ICD-10-CM   1. Asthma with acute exacerbation, unspecified asthma severity 493.92 J45.901 azithromycin (ZITHROMAX) 250 MG tablet     albuterol (PROAIR HFA) 108 (90 BASE) MCG/ACT inhaler     predniSONE (DELTASONE) 10 MG tablet  2. SOB (shortness of breath) 786.05 R06.02 Ambulatory referral to Pulmonology     DG Chest 2 View  3. Essential hypertension 401.9 I10 losartan-hydrochlorothiazide (HYZAAR) 100-12.5 MG per tablet     Return if symptoms worsen or fail to improve.  Lysbeth Penner FNP

## 2014-01-17 ENCOUNTER — Other Ambulatory Visit: Payer: Self-pay | Admitting: Family Medicine

## 2014-01-18 ENCOUNTER — Telehealth: Payer: Self-pay | Admitting: Family Medicine

## 2014-01-18 NOTE — Telephone Encounter (Signed)
Continues to have some chest congestion and feels that he can't take a good deep breath. He has 2 pills of prednisone left and has completed zpak. Advised that Zpak works for about 10 days.  He is also using his inhalers. Has pulmonary appt on 01/26/13.  Would you like to prescribe or suggest anything else in the meantime?

## 2014-01-19 ENCOUNTER — Other Ambulatory Visit: Payer: Self-pay | Admitting: Family Medicine

## 2014-01-19 MED ORDER — PREDNISONE 10 MG PO TABS: ORAL_TABLET | ORAL | Status: DC

## 2014-01-19 NOTE — Telephone Encounter (Signed)
New prednisone rx sent to pharmacy

## 2014-01-19 NOTE — Telephone Encounter (Signed)
Patient aware rx sent to pharmacy for prednisone.

## 2014-01-24 ENCOUNTER — Encounter: Payer: Self-pay | Admitting: *Deleted

## 2014-01-26 ENCOUNTER — Encounter: Payer: Self-pay | Admitting: Internal Medicine

## 2014-01-26 ENCOUNTER — Ambulatory Visit (INDEPENDENT_AMBULATORY_CARE_PROVIDER_SITE_OTHER): Payer: BLUE CROSS/BLUE SHIELD | Admitting: Internal Medicine

## 2014-01-26 DIAGNOSIS — J4520 Mild intermittent asthma, uncomplicated: Secondary | ICD-10-CM

## 2014-01-26 MED ORDER — RANITIDINE HCL 150 MG PO TABS: ORAL_TABLET | ORAL | Status: DC

## 2014-01-26 MED ORDER — VALSARTAN-HYDROCHLOROTHIAZIDE 160-25 MG PO TABS: 1.0000 | ORAL_TABLET | Freq: Every day | ORAL | Status: DC

## 2014-01-26 NOTE — Assessment & Plan Note (Signed)
Symptoms are severe and way  disproportionate to objective findings and not clear this is all a lung problem but pt does appear to have difficult airway management issues. DDX of  difficult airways management all start with A and  include Adherence, Ace Inhibitors, Acid Reflux, Active Sinus Disease, Alpha 1 Antitripsin deficiency, Anxiety masquerading as Airways dz,  ABPA,  allergy(esp in young), Aspiration (esp in elderly), Adverse effects of DPI,  Active smokers, plus two Bs  = Bronchiectasis and Beta blocker use..and one C= CHF  Adherence is always the initial "prime suspect" and is a multilayered concern that requires a "trust but verify" approach in every patient - starting with knowing how to use medications, especially inhalers, correctly, keeping up with refills and understanding the fundamental difference between maintenance and prns vs those medications only taken for a very short course and then stopped and not refilled.  - dulera should be 2 bid > agree with keeping low dose due to concern/ overlap with upper airway cough syndrome  ? Acid (or non-acid) GERD > always difficult to exclude as up to 75% of pts in some series report no assoc GI/ Heartburn symptoms> rec max (24h)  acid suppression and diet restrictions/ reviewed and instructions given in writing.   ? Anxiety > typically dx of exclusion     Each maintenance medication was reviewed in detail including most importantly the difference between maintenance and as needed and under what circumstances the prns are to be used.  Please see instructions for details which were reviewed in writing and the patient given a copy.    Will see him back in 4 weeks with pfts on return

## 2014-01-26 NOTE — Progress Notes (Signed)
Subjective:     Patient ID: Johnny Mathews, male   DOB: 02/08/1960,    MRN: [id number]  HPI  24 yowm quit smoking 1996 but dx of asthma as child and needed inhlalers all his life complicated by pna/ "collapsed lung" but did some better teenager never able to play sports,  eval at [facility]. Had nf,   Started on shots completed by  Teenage years but always used inhalers ? First maint = advair but burned throat but now much worse since 2013 with variable sob/ nightly chest tightness usually better on saba/pred but refractory  for the first time x early Jan 2015 despite dulera dosed 2 puffs at bedtime so referred to pulmonary clinic 01/26/2014 by Dietrich Pates at the [facility]/ Dr Laurance Flatten office    01/26/2014 1st [facility] office visit/ Johnny Mathews   Chief Complaint  Patient presents with  . Pulmonary Consult    Referred by Dr. Sallyanne Havers. Pt c/o dyspnea for the past 3 yrs- worse x 1 month. He states that he gets SOB whith exertion while at work. He also gets SOB when he lies down "feels like sufficating"- uses albuterol 6-7 times per day.   no congestion / mucus but rather dry upper airway  Hacking and poor hfa noted  Worse at hs even  p  dulera hs tpically feels needs saba within 30 min but only helps a little if at all on nexium at hs  No obvious other patters in  day to day or daytime variabilty or assoc excess or purulent mucus or cp   subjective wheeze overt sinus or hb symptoms. No unusual exp hx or h/o childhood pna/ asthma or knowledge of premature birth.  Sleeping ok without nocturnal  or early am exacerbation  of respiratory  c/o's or need for noct saba. Also denies any obvious fluctuation of symptoms with weather or environmental changes or other aggravating or alleviating factors except as outlined above   Current Medications, Allergies, Complete Past Medical History, Past Surgical History, Family History, and Social History were reviewed in [employer] record.  ROS  The  following are not active complaints unless bolded sore throat, dysphagia, dental problems, itching, sneezing,  nasal congestion or excess/ purulent secretions, ear ache,   fever, chills, sweats, unintended wt loss, pleuritic or exertional cp, hemoptysis,  orthopnea pnd or leg swelling, presyncope, palpitations, heartburn, abdominal pain, anorexia, nausea, vomiting, diarrhea  or change in bowel or urinary habits, change in stools or urine, dysuria,hematuria,  rash, arthralgias, visual complaints, headache, numbness weakness or ataxia or problems with walking or coordination,  change in mood/affect or memory.                   Review of Systems     Objective:   Physical Exam    amb wm nad/ mild pseudowheeze   Wt Readings from Last 3 Encounters:  01/26/14 196 lb (88.905 kg)  01/13/14 192 lb (87.091 kg)  12/26/13 190 lb (86.183 kg)    Vital signs reviewed   HEENT: nl dentition, turbinates, and orophanx. Nl external ear canals without cough reflex   NECK :  without JVD/Nodes/TM/ nl carotid upstrokes bilaterally   LUNGS: no acc muscle use, clear to A and P bilaterally without cough on insp or exp maneuvers   CV:  RRR  no s3 or murmur or increase in P2, no edema   ABD:  soft and nontender with nl excursion in the supine position. No bruits or organomegaly,  bowel sounds nl  MS:  warm without deformities, calf tenderness, cyanosis or clubbing  SKIN: warm and dry without lesions    NEURO:  alert, approp, no deficits   CXR:  01/13/14  I personally reviewed images and agree with radiology impression as follows:   IMPRESSION: No active cardiopulmonary disease.          Assessment:

## 2014-01-26 NOTE — Patient Instructions (Addendum)
Plan A = dulera 100 Take 2 puffs first thing in am and then another 2 puffs about 12 hours later and continue singulair 10 mg in evening plus Nexium 40 mg Take 30-60 min before first meal of the day and take zantac 150 mg take 2 at bedtime  Plan B Only use your albuterol (proair)  as a rescue medication to be used if you can't catch your breath by resting or doing a relaxed purse lip breathing pattern.  - The less you use it, the better it will work when you need it. - Ok to use up to 2 puffs  every 4 hours if you must but call for immediate appointment if use goes up over your usual need - Don't leave home without it !!  (think of it like the spare tire for your car)   GERD (REFLUX)  is an extremely common cause of respiratory symptoms just like yours , many times with no obvious heartburn at all.    It can be treated with medication, but also with lifestyle changes including avoidance of late meals, excessive alcohol, smoking cessation, and avoid fatty foods, chocolate, peppermint, colas, red wine, and acidic juices such as orange juice.  NO MINT OR MENTHOL PRODUCTS SO NO COUGH DROPS  USE SUGARLESS CANDY INSTEAD (Jolley ranchers or Stover's or Life Savers) or even ice chips will also do - the key is to swallow to prevent all throat clearing. NO OIL BASED VITAMINS - use powdered substitutes.   Stop hyzar and start valsartan 160-25 one daily   Please schedule a follow up office visit in 4 weeks, call sooner if needed with pfts but don't use any inhalers that am if at all possible

## 2014-02-18 ENCOUNTER — Other Ambulatory Visit: Payer: Self-pay | Admitting: Family Medicine

## 2014-02-27 ENCOUNTER — Ambulatory Visit (INDEPENDENT_AMBULATORY_CARE_PROVIDER_SITE_OTHER): Payer: BLUE CROSS/BLUE SHIELD | Admitting: Internal Medicine

## 2014-02-27 ENCOUNTER — Encounter: Payer: Self-pay | Admitting: Internal Medicine

## 2014-02-27 VITALS — BP 130/70 | HR 66 | Ht 69.5 in | Wt 194.0 lb

## 2014-02-27 DIAGNOSIS — J4520 Mild intermittent asthma, uncomplicated: Secondary | ICD-10-CM

## 2014-02-27 LAB — PULMONARY FUNCTION TEST
DL/VA % pred: 111 %
DL/VA: 5.13 ml/min/mmHg/L
DLCO UNC % PRED: 81 %
DLCO UNC: 25.9 ml/min/mmHg
FEF 25-75 Post: 5.23 L/sec
FEF 25-75 Pre: 4.84 L/sec
FEF2575-%Change-Post: 7 %
FEF2575-%Pred-Post: 166 %
FEF2575-%Pred-Pre: 154 %
FEV1-%Change-Post: 3 %
FEV1-%PRED-POST: 89 %
FEV1-%PRED-PRE: 86 %
FEV1-PRE: 3.2 L
FEV1-Post: 3.31 L
FEV1FVC-%Change-Post: 1 %
FEV1FVC-%Pred-Pre: 115 %
FEV6-%Change-Post: 1 %
FEV6-%Pred-Post: 79 %
FEV6-%Pred-Pre: 77 %
FEV6-POST: 3.64 L
FEV6-Pre: 3.57 L
FEV6FVC-%PRED-PRE: 104 %
FEV6FVC-%Pred-Post: 104 %
FVC-%Change-Post: 1 %
FVC-%PRED-POST: 75 %
FVC-%PRED-PRE: 74 %
FVC-Post: 3.64 L
FVC-Pre: 3.58 L
POST FEV1/FVC RATIO: 91 %
PRE FEV6/FVC RATIO: 100 %
Post FEV6/FVC ratio: 100 %
Pre FEV1/FVC ratio: 89 %
RV % pred: 77 %
RV: 1.66 L
TLC % pred: 76 %
TLC: 5.28 L

## 2014-02-27 NOTE — Patient Instructions (Addendum)
No change in your medications for now and let's see how you do with the spring allergy season  on this combination   Work on maintaining perfect inhaler technique:  relax and gently blow all the way out then take a nice smooth deep breath back in, triggering the inhaler at same time you start breathing in.  Hold for up to 5 seconds if you can.  Rinse and gargle with water when done      Please schedule a follow up visit in 3 months but call sooner if needed

## 2014-02-27 NOTE — Progress Notes (Signed)
PFT done today. 

## 2014-02-27 NOTE — Progress Notes (Signed)
Subjective:     Patient ID: Johnny Mathews, male   DOB: [birth year],    MRN: [id number]    Brief patient profile:  34 yowm quit smoking 1996 but dx of asthma as child and needed inhlalers all his life complicated by pna/ "collapsed lung" but did some better teenager never able to play sports,  eval at [facility]. Had nf,   Started on shots completed by  Teenage years but always used inhalers ? First maint = advair but burned throat but now much worse since 2013 with variable sob/ nightly chest tightness usually better on saba/pred but refractory  for the first time x early Jan 2015 despite dulera dosed 2 puffs at bedtime so referred to pulmonary clinic 01/26/2014 by Dietrich Pates at the [facility]/ Dr Laurance Flatten office with nl pfts on dulera 100 on 02/27/14    History of Present Illness  01/26/2014 1st [facility] office visit/ Helayne Metsker   Chief Complaint  Patient presents with  . Pulmonary Consult    Referred by Dr. Sallyanne Havers. Pt c/o dyspnea for the past 3 yrs- worse x 1 month. He states that he gets SOB whith exertion while at work. He also gets SOB when he lies down "feels like sufficating"- uses albuterol 6-7 times per day.   no congestion / mucus but rather dry upper airway  Hacking and poor hfa noted  Worse at hs even  p  dulera  typically feels needs saba within 30 min but only helps a little if at all on nexium at hs rec Plan A = dulera 100 Take 2 puffs first thing in am and then another 2 puffs about 12 hours later and continue singulair 10 mg in evening plus Nexium 40 mg Take 30-60 min before first meal of the day and take zantac 150 mg take 2 at bedtime Plan B Only use your albuterol (proair)  as a rescue medication  GERD (REFLUX)   Stop hyzar and start valsartan 160-25 one daily  Please schedule a follow up office visit in 4 weeks    02/27/2014 f/u ov/Cason Dabney re:  Asthma   Chief Complaint  Patient presents with  . Follow-up    PFT done today. Pt states that his breathing has improved some since the  last visit. He has only used albuterol inhaler x 2 since last seen. No new co's today.   cats are the main problem he's aware of that still triggers any symptoms and as long as avoids them does fine   No obvious other patters in  day to day or daytime variabilty or assoc excess or purulent mucus or cp   subjective wheeze overt sinus or hb symptoms. No unusual exp hx or h/o childhood pna/ asthma or knowledge of premature birth.  Sleeping ok without nocturnal  or early am exacerbation  of respiratory  c/o's or need for noct saba. Also denies any obvious fluctuation of symptoms with weather or environmental changes or other aggravating or alleviating factors except as outlined above   Current Medications, Allergies, Complete Past Medical History, Past Surgical History, Family History, and Social History were reviewed in [employer] record.  ROS  The following are not active complaints unless bolded sore throat, dysphagia, dental problems, itching, sneezing,  nasal congestion or excess/ purulent secretions, ear ache,   fever, chills, sweats, unintended wt loss, pleuritic or exertional cp, hemoptysis,  orthopnea pnd or leg swelling, presyncope, palpitations, heartburn, abdominal pain, anorexia, nausea, vomiting, diarrhea  or change in bowel  or urinary habits, change in stools or urine, dysuria,hematuria,  rash, arthralgias, visual complaints, headache, numbness weakness or ataxia or problems with walking or coordination,  change in mood/affect or memory.                        Objective:   Physical Exam    amb wm nad  02/27/2014         194  Wt Readings from Last 3 Encounters:  01/26/14 196 lb (88.905 kg)  01/13/14 192 lb (87.091 kg)  12/26/13 190 lb (86.183 kg)    Vital signs reviewed   HEENT: nl dentition, turbinates, and orophanx. Nl external ear canals without cough reflex   NECK :  without JVD/Nodes/TM/ nl carotid upstrokes bilaterally   LUNGS: no acc  muscle use, clear to A and P bilaterally without cough on insp or exp maneuvers   CV:  RRR  no s3 or murmur or increase in P2, no edema   ABD:  soft and nontender with nl excursion in the supine position. No bruits or organomegaly, bowel sounds nl  MS:  warm without deformities, calf tenderness, cyanosis or clubbing  SKIN: warm and dry without lesions    NEURO:  alert, approp, no deficits   CXR:  01/13/14  I personally reviewed images and agree with radiology impression as follows:   IMPRESSION: No active cardiopulmonary disease.          Assessment:

## 2014-03-02 ENCOUNTER — Encounter: Payer: Self-pay | Admitting: Internal Medicine

## 2014-03-02 NOTE — Assessment & Plan Note (Addendum)
-  01/26/2014 p extensive coaching HFA effectiveness =    75% >  Changed dulera to 100 2bid   - 02/27/2014 PFTs wnl s any inhalers in am   I had an extended discussion with the patient reviewing all relevant studies completed to date and  lasting 15 to 20 minutes of a 25 minute visit on the following ongoing concerns:  - 02/27/2014 p extensive coaching HFA effectiveness =    90%  All goals of chronic asthma control met including optimal function and elimination of symptoms with minimal need for rescue therapy.  Contingencies discussed in full including contacting this office immediately if not controlling the symptoms using the rule of two's.     F/u q 3 m ? Stepdown p spring season

## 2014-03-21 ENCOUNTER — Other Ambulatory Visit: Payer: Self-pay | Admitting: Family Medicine

## 2014-04-20 ENCOUNTER — Ambulatory Visit (INDEPENDENT_AMBULATORY_CARE_PROVIDER_SITE_OTHER): Payer: BLUE CROSS/BLUE SHIELD | Admitting: Nurse Practitioner

## 2014-04-20 ENCOUNTER — Encounter: Payer: Self-pay | Admitting: Nurse Practitioner

## 2014-04-20 VITALS — BP 128/87 | HR 81 | Temp 97.5°F | Ht 69.5 in | Wt 187.0 lb

## 2014-04-20 DIAGNOSIS — R1030 Lower abdominal pain, unspecified: Secondary | ICD-10-CM

## 2014-04-20 MED ORDER — HYDROCODONE-ACETAMINOPHEN 5-325 MG PO TABS: 1.0000 | ORAL_TABLET | Freq: Four times a day (QID) | ORAL | Status: DC | PRN

## 2014-04-20 NOTE — Progress Notes (Signed)
   Subjective:    Patient ID: Johnny Mathews, male    DOB: 1960/09/21, 54 y.o.   MRN: [id number]  HPI Patient in today c/o sharp pain  in groin area- he said he had to lay down in the floor the pain was so bad. Went home and had bowel movement that bright red blood in it. Went back to work and still having pain iun groin area. Says the pain is the same pain he had years ago when he had a kidney stone. Urine is dark. Rates groin pain 8/10 currently.    Review of Systems  Constitutional: Negative.   HENT: Negative.   Respiratory: Negative.   Cardiovascular: Negative.   Gastrointestinal: Positive for abdominal pain and blood in stool. Negative for nausea, diarrhea and rectal pain.  Genitourinary: Positive for flank pain.  Neurological: Negative.   Psychiatric/Behavioral: Negative.   All other systems reviewed and are negative.      Objective:   Physical Exam  Constitutional: He is oriented to person, place, and time. He appears well-developed and well-nourished. He appears distressed.  Cardiovascular: Normal rate, regular rhythm and normal heart sounds.   Pulmonary/Chest: Effort normal and breath sounds normal.  Neurological: He is alert and oriented to person, place, and time.  Skin: Skin is warm and dry.  Psychiatric: He has a normal mood and affect. His behavior is normal. Judgment and thought content normal.   BP 128/87 mmHg  Pulse 81  Temp(Src) 97.5 F (36.4 C)  Ht 5' 9.5" (1.765 m)  Wt 187 lb (84.823 kg)  BMI 27.23 kg/m2  *Patient unable to void      Assessment & Plan:   1. Groin pain, unspecified laterality    Meds ordered this encounter  Medications  . HYDROcodone-acetaminophen (LORTAB) 5-325 MG per tablet    Sig: Take 1 tablet by mouth every 6 (six) hours as needed for moderate pain.    Dispense:  40 tablet    Refill:  0    Order Specific Question:  Supervising Provider    Answer:  Chipper Herb [[id number]]   Strain urine Bring urine specimen back with him in  AM Will do KUB in AM If worsens during night go to [place], FNP

## 2014-04-20 NOTE — Patient Instructions (Signed)

## 2014-04-21 ENCOUNTER — Other Ambulatory Visit (INDEPENDENT_AMBULATORY_CARE_PROVIDER_SITE_OTHER): Payer: BLUE CROSS/BLUE SHIELD

## 2014-04-21 ENCOUNTER — Other Ambulatory Visit: Payer: BLUE CROSS/BLUE SHIELD

## 2014-04-21 DIAGNOSIS — R1030 Lower abdominal pain, unspecified: Secondary | ICD-10-CM | POA: Diagnosis not present

## 2014-04-21 LAB — POCT URINALYSIS DIPSTICK
BILIRUBIN UA: NEGATIVE
Glucose, UA: NEGATIVE
KETONES UA: NEGATIVE
LEUKOCYTES UA: NEGATIVE
Nitrite, UA: NEGATIVE
Spec Grav, UA: 1.01
Urobilinogen, UA: NEGATIVE
pH, UA: 7

## 2014-04-21 LAB — POCT UA - MICROSCOPIC ONLY
Bacteria, U Microscopic: NEGATIVE
CRYSTALS, UR, HPF, POC: NEGATIVE
MUCUS UA: NEGATIVE
WBC, Ur, HPF, POC: NEGATIVE
YEAST UA: NEGATIVE

## 2014-05-02 ENCOUNTER — Other Ambulatory Visit: Payer: Self-pay | Admitting: Family Medicine

## 2014-05-08 ENCOUNTER — Other Ambulatory Visit: Payer: Self-pay | Admitting: Nurse Practitioner

## 2014-05-09 ENCOUNTER — Ambulatory Visit: Payer: BLUE CROSS/BLUE SHIELD | Admitting: Family Medicine

## 2014-05-11 ENCOUNTER — Ambulatory Visit: Payer: BC Managed Care – PPO | Admitting: Family Medicine

## 2014-05-17 ENCOUNTER — Ambulatory Visit (INDEPENDENT_AMBULATORY_CARE_PROVIDER_SITE_OTHER): Payer: BLUE CROSS/BLUE SHIELD | Admitting: Family Medicine

## 2014-05-17 ENCOUNTER — Encounter: Payer: Self-pay | Admitting: Family Medicine

## 2014-05-17 VITALS — BP 129/84 | HR 66 | Temp 99.1°F | Ht 69.5 in | Wt 190.0 lb

## 2014-05-17 DIAGNOSIS — G7100 Muscular dystrophy, unspecified: Secondary | ICD-10-CM

## 2014-05-17 DIAGNOSIS — E119 Type 2 diabetes mellitus without complications: Secondary | ICD-10-CM

## 2014-05-17 DIAGNOSIS — E785 Hyperlipidemia, unspecified: Secondary | ICD-10-CM

## 2014-05-17 DIAGNOSIS — K219 Gastro-esophageal reflux disease without esophagitis: Secondary | ICD-10-CM

## 2014-05-17 DIAGNOSIS — J4520 Mild intermittent asthma, uncomplicated: Secondary | ICD-10-CM | POA: Diagnosis not present

## 2014-05-17 LAB — POCT GLYCOSYLATED HEMOGLOBIN (HGB A1C): Hemoglobin A1C: 4.7

## 2014-05-17 MED ORDER — EXENATIDE 5 MCG/0.02ML ~~LOC~~ SOPN: 5.0000 ug | PEN_INJECTOR | Freq: Two times a day (BID) | SUBCUTANEOUS | Status: DC

## 2014-05-17 NOTE — Progress Notes (Signed)
Subjective:    Patient ID: Johnny Mathews, male    DOB: December 10, 1960, 54 y.o.   MRN: [id number]  HPI 55-year-old gentleman with diabetes and asthma. His pulmonologist had increased his inhaled steroid, actually doubled it. He is having more problems with fluctuation of his blood sugars and that may be a result of increased steroid. He has been on weekly injections of exenatide but prefers to go back to the daily twice a day use of same class drug.  Since his last visit here he has been treated for prostate infection, enlargement, and is now on Flomax. He feels that has made a big difference in his urinary frequency and caliber of urine  Patient Active Problem List   Diagnosis Date Noted  . Diabetes mellitus without complication   . Right knee meniscal tear 10/26/2013  . GERD (gastroesophageal reflux disease) 12/23/2012  . Hyperlipidemia with target LDL less than 100 12/23/2012  . T2DM (type 2 diabetes mellitus) 12/23/2012  . Fluid retention 12/23/2012  . Asthma, mild intermittent 12/23/2012  . Muscular dystrophy 04/29/2012  . Right shoulder pain 03/02/2012   Outpatient Encounter Prescriptions as of 05/17/2014  Medication Sig  . albuterol (PROAIR HFA) 108 (90 BASE) MCG/ACT inhaler Inhale 2 puffs into the lungs every 4 (four) hours as needed for wheezing or shortness of breath.  Marland Kitchen amitriptyline (ELAVIL) 25 MG tablet TAKE 3 TABLETS BY MOUTH AT BEDTIME  . azelastine (ASTELIN) 0.1 % nasal spray USE 2 SPRAYS IN EACH NOSTRIL AT BEDTIME  . DULERA 100-5 MCG/ACT AERO INHALE 2 PUFFS INTO THE LUNGS TWICE A DAY  . EPINEPHrine (EPI-PEN) 0.3 mg/0.3 mL DEVI Inject 0.3 mg into the skin as needed.   . Exenatide ER (BYDUREON) 2 MG SUSR Inject 2 mg into the skin once a week.  . fluticasone (FLONASE) 50 MCG/ACT nasal spray PLACE 1 SPRAY INTO THE NOSE DAILY  . meclizine (ANTIVERT) 25 MG tablet TAKE ONE (1) TABLET AT BEDTIME  . montelukast (SINGULAIR) 10 MG tablet TAKE ONE (1) TABLET EACH DAY  . NEXIUM 40 MG  capsule TAKE ONE (1) CAPSULE EACH DAY  . pravastatin (PRAVACHOL) 40 MG tablet Take 1 tablet (40 mg total) by mouth daily.  . ranitidine (ZANTAC) 150 MG tablet Take 2 at bedtime  . ranitidine (ZANTAC) 150 MG tablet TAKE ONE TABLET BY MOUTH TWICE DAILY  . tamsulosin (FLOMAX) 0.4 MG CAPS capsule Take 0.4 mg by mouth daily.  . valsartan-hydrochlorothiazide (DIOVAN HCT) 160-25 MG per tablet Take 1 tablet by mouth daily.  . [DISCONTINUED] HYDROcodone-acetaminophen (LORTAB) 5-325 MG per tablet Take 1 tablet by mouth every 6 (six) hours as needed for moderate pain.   No facility-administered encounter medications on file as of 05/17/2014.      Review of Systems  Constitutional: Negative.   Respiratory: Positive for cough.   Cardiovascular: Negative.   Genitourinary: Positive for frequency.       Objective:   Physical Exam  Constitutional: He is oriented to person, place, and time. He appears well-developed and well-nourished.  Cardiovascular: Normal rate, regular rhythm and intact distal pulses.   Pulmonary/Chest: Effort normal.  Abdominal: Soft.  Neurological: He is alert and oriented to person, place, and time.   BP 129/84 mmHg  Pulse 66  Temp(Src) 99.1 F (37.3 C) (Oral)  Ht 5' 9.5" (1.765 m)  Wt 190 lb (86.183 kg)  BMI 27.67 kg/m2        Assessment & Plan:  1. Hyperlipidemia with target LDL less than 100 Continues  on pravastatin. LDL is less than 100 and is at goal.  2. Gastroesophageal reflux disease, esophagitis presence not specified Symptoms controlled with H2 blocker, ranitidine. Continue same  3. Asthma, mild intermittent, uncomplicated Now taking Dulera 100-52 puffs twice a day and symptoms are better controlled except for some residual cough  4. Muscular dystrophy No change in symptoms  5. Diabetes mellitus without complication Even though A1c is really good patient requests to go back to [place] and I will okay that prescription change - POCT glycosylated  hemoglobin (Hb A1C) Wardell Honour MD - POCT UA - Microalbumin

## 2014-05-20 ENCOUNTER — Other Ambulatory Visit: Payer: Self-pay | Admitting: Family Medicine

## 2014-06-06 ENCOUNTER — Other Ambulatory Visit: Payer: Self-pay | Admitting: Nurse Practitioner

## 2014-06-17 ENCOUNTER — Other Ambulatory Visit: Payer: Self-pay | Admitting: Family Medicine

## 2014-06-23 ENCOUNTER — Other Ambulatory Visit: Payer: Self-pay | Admitting: Family Medicine

## 2014-06-26 ENCOUNTER — Other Ambulatory Visit: Payer: Self-pay

## 2014-06-26 MED ORDER — ESOMEPRAZOLE MAGNESIUM 40 MG PO CPDR: DELAYED_RELEASE_CAPSULE | ORAL | Status: DC

## 2014-07-25 DIAGNOSIS — G713 Mitochondrial myopathy, not elsewhere classified: Secondary | ICD-10-CM | POA: Insufficient documentation

## 2014-07-29 ENCOUNTER — Other Ambulatory Visit: Payer: Self-pay | Admitting: Family Medicine

## 2014-08-01 ENCOUNTER — Other Ambulatory Visit: Payer: Self-pay | Admitting: Nurse Practitioner

## 2014-09-05 ENCOUNTER — Other Ambulatory Visit: Payer: Self-pay | Admitting: Family Medicine

## 2014-09-25 ENCOUNTER — Other Ambulatory Visit: Payer: Self-pay | Admitting: Family Medicine

## 2014-09-26 NOTE — Telephone Encounter (Signed)
Last seen 05/17/14  Dr Sabra Heck

## 2014-09-27 NOTE — Telephone Encounter (Signed)
Medication refill per patient request

## 2014-10-02 ENCOUNTER — Ambulatory Visit (INDEPENDENT_AMBULATORY_CARE_PROVIDER_SITE_OTHER): Payer: BLUE CROSS/BLUE SHIELD | Admitting: Family Medicine

## 2014-10-02 ENCOUNTER — Encounter: Payer: Self-pay | Admitting: Family Medicine

## 2014-10-02 VITALS — BP 126/78 | HR 78 | Temp 98.1°F | Ht 69.5 in | Wt 192.2 lb

## 2014-10-02 DIAGNOSIS — M5432 Sciatica, left side: Secondary | ICD-10-CM | POA: Insufficient documentation

## 2014-10-02 MED ORDER — PREDNISONE 20 MG PO TABS: ORAL_TABLET | ORAL | Status: DC

## 2014-10-02 MED ORDER — CYCLOBENZAPRINE HCL 5 MG PO TABS: 5.0000 mg | ORAL_TABLET | Freq: Three times a day (TID) | ORAL | Status: DC | PRN

## 2014-10-02 NOTE — Patient Instructions (Signed)
Great to meet you!  Sciatica Sciatica is pain, weakness, numbness, or tingling along the path of the sciatic nerve. The nerve starts in the lower back and runs down the back of each leg. The nerve controls the muscles in the lower leg and in the back of the knee, while also providing sensation to the back of the thigh, lower leg, and the sole of your foot. Sciatica is a symptom of another medical condition. For instance, nerve damage or certain conditions, such as a herniated disk or bone spur on the spine, pinch or put pressure on the sciatic nerve. This causes the pain, weakness, or other sensations normally associated with sciatica. Generally, sciatica only affects one side of the body. CAUSES   Herniated or slipped disc.  Degenerative disk disease.  A pain disorder involving the narrow muscle in the buttocks (piriformis syndrome).  Pelvic injury or fracture.  Pregnancy.  Tumor (rare). SYMPTOMS  Symptoms can vary from mild to very severe. The symptoms usually travel from the low back to the buttocks and down the back of the leg. Symptoms can include:  Mild tingling or dull aches in the lower back, leg, or hip.  Numbness in the back of the calf or sole of the foot.  Burning sensations in the lower back, leg, or hip.  Sharp pains in the lower back, leg, or hip.  Leg weakness.  Severe back pain inhibiting movement. These symptoms may get worse with coughing, sneezing, laughing, or prolonged sitting or standing. Also, being overweight may worsen symptoms. DIAGNOSIS  Your caregiver will perform a physical exam to look for common symptoms of sciatica. He or she may ask you to do certain movements or activities that would trigger sciatic nerve pain. Other tests may be performed to find the cause of the sciatica. These may include:  Blood tests.  X-rays.  Imaging tests, such as an MRI or CT scan. TREATMENT  Treatment is directed at the cause of the sciatic pain. Sometimes,  treatment is not necessary and the pain and discomfort goes away on its own. If treatment is needed, your caregiver may suggest:  Over-the-counter medicines to relieve pain.  Prescription medicines, such as anti-inflammatory medicine, muscle relaxants, or narcotics.  Applying heat or ice to the painful area.  Steroid injections to lessen pain, irritation, and inflammation around the nerve.  Reducing activity during periods of pain.  Exercising and stretching to strengthen your abdomen and improve flexibility of your spine. Your caregiver may suggest losing weight if the extra weight makes the back pain worse.  Physical therapy.  Surgery to eliminate what is pressing or pinching the nerve, such as a bone spur or part of a herniated disk. HOME CARE INSTRUCTIONS   Only take over-the-counter or prescription medicines for pain or discomfort as directed by your caregiver.  Apply ice to the affected area for 20 minutes, 3-4 times a day for the first 48-72 hours. Then try heat in the same way.  Exercise, stretch, or perform your usual activities if these do not aggravate your pain.  Attend physical therapy sessions as directed by your caregiver.  Keep all follow-up appointments as directed by your caregiver.  Do not wear high heels or shoes that do not provide proper support.  Check your mattress to see if it is too soft. A firm mattress may lessen your pain and discomfort. SEEK IMMEDIATE MEDICAL CARE IF:   You lose control of your bowel or bladder (incontinence).  You have increasing weakness in  the lower back, pelvis, buttocks, or legs.  You have redness or swelling of your back.  You have a burning sensation when you urinate.  You have pain that gets worse when you lie down or awakens you at night.  Your pain is worse than you have experienced in the past.  Your pain is lasting longer than 4 weeks.  You are suddenly losing weight without reason. MAKE SURE  YOU:  Understand these instructions.  Will watch your condition.  Will get help right away if you are not doing well or get worse. Document Released: 12/17/2000 Document Revised: 06/24/2011 Document Reviewed: 05/04/2011 [facility] Patient Information 2015 [place], [place]. This information is not intended to replace advice given to you by your health care provider. Make sure you discuss any questions you have with your health care provider.

## 2014-10-02 NOTE — Progress Notes (Signed)
   HPI  Patient presents today here for evaluation of back pain  Patient explains that he was picking up a heavy piece of furniture 2 days ago whenever he developed sudden onset right-sided low back pain that radiates down his right leg. He's never had an episode similar to this  He denies bowel or bladder dysfunction, saddle anesthesia, and difficulty walking or leg weakness. It is painful to walk  PMH: Smoking status noted ROS: Per HPI  Objective: BP 126/78 mmHg  Pulse 78  Temp(Src) 98.1 F (36.7 C) (Oral)  Ht 5' 9.5" (1.765 m)  Wt 192 lb 3.2 oz (87.181 kg)  BMI 27.99 kg/m2 Gen: NAD, alert, cooperative with exam HEENT: NCAT Ext: No edema, warm Neuro: Alert and oriented, No gross deficits Musculoskeletal: Slight paraspinal tenderness to palpation on left  Lower back, low but normal gait  Decreased paty stable per patient  Assessment and plan:  # Sciatica Treat with steroid, NSAIDs, and Flexeril Reviewed red flags for return and provided patient information Follow-up as needed     Meds ordered this encounter  Medications  . montelukast (SINGULAIR) 10 MG tablet    Sig: Take 10 mg by mouth at bedtime.  . predniSONE (DELTASONE) 20 MG tablet    Sig: Take 2 tabs daily for 4 days, then take 1 tab daily for 4 days, then take one half tab daily 4 days then stop.    Dispense:  14 tablet    Refill:  0  . cyclobenzaprine (FLEXERIL) 5 MG tablet    Sig: Take 1 tablet (5 mg total) by mouth 3 (three) times daily as needed for muscle spasms.    Dispense:  30 tablet    Refill:  0    Laroy Apple, MD [facility] 10/02/2014, 3:34 PM

## 2014-10-09 ENCOUNTER — Other Ambulatory Visit: Payer: Self-pay | Admitting: Family Medicine

## 2014-10-23 ENCOUNTER — Other Ambulatory Visit: Payer: Self-pay | Admitting: Family Medicine

## 2014-11-14 ENCOUNTER — Other Ambulatory Visit: Payer: Self-pay | Admitting: Family Medicine

## 2014-11-23 ENCOUNTER — Encounter: Payer: Self-pay | Admitting: Family Medicine

## 2014-11-23 ENCOUNTER — Ambulatory Visit (INDEPENDENT_AMBULATORY_CARE_PROVIDER_SITE_OTHER): Payer: BLUE CROSS/BLUE SHIELD | Admitting: Family Medicine

## 2014-11-23 VITALS — BP 124/81 | HR 77 | Temp 96.7°F | Ht 69.5 in | Wt 189.2 lb

## 2014-11-23 DIAGNOSIS — Z0000 Encounter for general adult medical examination without abnormal findings: Secondary | ICD-10-CM

## 2014-11-23 DIAGNOSIS — E785 Hyperlipidemia, unspecified: Secondary | ICD-10-CM

## 2014-11-23 DIAGNOSIS — E119 Type 2 diabetes mellitus without complications: Secondary | ICD-10-CM

## 2014-11-23 LAB — POCT GLYCOSYLATED HEMOGLOBIN (HGB A1C): HEMOGLOBIN A1C: 5.2

## 2014-11-23 MED ORDER — LIRAGLUTIDE 18 MG/3ML ~~LOC~~ SOPN: 1.8000 mg | PEN_INJECTOR | Freq: Every day | SUBCUTANEOUS | Status: DC

## 2014-11-23 MED ORDER — PREGABALIN 50 MG PO CAPS: 50.0000 mg | ORAL_CAPSULE | Freq: Three times a day (TID) | ORAL | Status: DC

## 2014-11-23 NOTE — Progress Notes (Signed)
BP 124/81 mmHg  Pulse 77  Temp(Src) 96.7 F (35.9 C) (Oral)  Ht 5' 9.5" (1.765 m)  Wt 189 lb 3.2 oz (85.821 kg)  BMI 27.55 kg/m2   Subjective:    Patient ID: Johnny Mathews, male    DOB: May 13, 1960, 54 y.o.   MRN: [id number]  HPI: Johnny Mathews is a 54 y.o. male presenting on 11/23/2014 for Annual Exam   HPI Adult well exam Patient is coming in today for his yearly adult well exam and recheck on his medications. He denies any major issues since his life is going well with them. He is trying to do diet and exercise for his diabetes and cholesterol.   Type 2 diabetes Patient is coming in today for a recheck on his type 2 diabetes. He is currently using Byetta twice a day and feels like things are going well on that. Patient denies headaches, blurred vision, chest pains, shortness of breath, or weakness. Denies any side effects from medication and is content with current medication. His hemoglobin A1c is 5.2 today and has been the lowest 4.7 previous on this medication. He denies any hypoglycemic episodes.  Hyperlipidemia Patient is currently taking Pravachol 40 mg daily to control his cholesterol and it is been doing well for him. He is due for recheck today and denies any cardiac symptoms such as chest pain, shortness of breath, lightheadedness, headaches, dizziness.  Relevant past medical, surgical, family and social history reviewed and updated as indicated. Interim medical history since our last visit reviewed. Allergies and medications reviewed and updated.  Review of Systems  Constitutional: Negative for fever and chills.  HENT: Negative for ear discharge and ear pain.   Eyes: Negative for discharge and visual disturbance.  Respiratory: Negative for shortness of breath and wheezing.   Cardiovascular: Negative for chest pain and leg swelling.  Gastrointestinal: Negative for abdominal pain, diarrhea and constipation.  Genitourinary: Negative for difficulty urinating.    Musculoskeletal: Negative for back pain and gait problem.  Skin: Negative for rash.  Neurological: Negative for dizziness, syncope, weakness, light-headedness, numbness and headaches.  All other systems reviewed and are negative.   Per HPI unless specifically indicated above     Medication List       This list is accurate as of: 11/23/14 11:09 AM.  Always use your most recent med list.               albuterol 108 (90 BASE) MCG/ACT inhaler  Commonly known as:  PROAIR HFA  Inhale 2 puffs into the lungs every 4 (four) hours as needed for wheezing or shortness of breath.     amitriptyline 25 MG tablet  Commonly known as:  ELAVIL  TAKE 3 TABLETS BY MOUTH AT BEDTIME     azelastine 0.1 % nasal spray  Commonly known as:  ASTELIN  USE 2 SPRAYS IN EACH NOSTRIL AT BEDTIME     BYETTA 5 MCG PEN 5 MCG/0.02ML Sopn injection  Generic drug:  exenatide  INJECT 0.02ML (5MCG) SQ TWICE DAILY WITH A MEAL     cyclobenzaprine 5 MG tablet  Commonly known as:  FLEXERIL  Take 1 tablet (5 mg total) by mouth 3 (three) times daily as needed for muscle spasms.     DULERA 100-5 MCG/ACT Aero  Generic drug:  mometasone-formoterol  INHALE 2 PUFFS INTO LUNGS TWICE A DAY     EPINEPHrine 0.3 mg/0.3 mL Devi  Commonly known as:  EPI-PEN  Inject 0.3 mg into the skin  as needed.     esomeprazole 40 MG capsule  Commonly known as:  NEXIUM  TAKE ONE (1) CAPSULE EACH DAY     fluticasone 50 MCG/ACT nasal spray  Commonly known as:  FLONASE  USE 1 SPRAY IN EACH NOSTRIL ONCE DAILY     Liraglutide 18 MG/3ML Sopn  Commonly known as:  VICTOZA  Inject 0.3 mLs (1.8 mg total) into the skin daily.     pravastatin 40 MG tablet  Commonly known as:  PRAVACHOL  Take 1 tablet (40 mg total) by mouth daily.     pregabalin 50 MG capsule  Commonly known as:  LYRICA  Take 1 capsule (50 mg total) by mouth 3 (three) times daily.     ranitidine 150 MG tablet  Commonly known as:  ZANTAC  Take 2 at bedtime      ranitidine 150 MG tablet  Commonly known as:  ZANTAC  TAKE ONE TABLET BY MOUTH TWICE DAILY     SINGULAIR 10 MG tablet  Generic drug:  montelukast  Take 10 mg by mouth at bedtime.     tamsulosin 0.4 MG Caps capsule  Commonly known as:  FLOMAX  Take 0.4 mg by mouth daily.     TRAVEL SICKNESS 25 MG Chew  Generic drug:  Meclizine HCl  TAKE ONE TABLET DAILY AT BEDTIME     valsartan-hydrochlorothiazide 160-25 MG tablet  Commonly known as:  DIOVAN HCT  Take 1 tablet by mouth daily.           Objective:    BP 124/81 mmHg  Pulse 77  Temp(Src) 96.7 F (35.9 C) (Oral)  Ht 5' 9.5" (1.765 m)  Wt 189 lb 3.2 oz (85.821 kg)  BMI 27.55 kg/m2  Wt Readings from Last 3 Encounters:  11/23/14 189 lb 3.2 oz (85.821 kg)  10/02/14 192 lb 3.2 oz (87.181 kg)  05/17/14 190 lb (86.183 kg)    Physical Exam  Constitutional: He is oriented to person, place, and time. He appears well-developed and well-nourished. No distress.  Eyes: Conjunctivae and EOM are normal. Pupils are equal, round, and reactive to light. Right eye exhibits no discharge. No scleral icterus.  Neck: Neck supple. No thyromegaly present.  Cardiovascular: Normal rate, regular rhythm, normal heart sounds and intact distal pulses.   No murmur heard. Pulmonary/Chest: Effort normal and breath sounds normal. No respiratory distress. He has no wheezes.  Musculoskeletal: Normal range of motion. He exhibits no edema.  Lymphadenopathy:    He has no cervical adenopathy.  Neurological: He is alert and oriented to person, place, and time. Coordination normal.  Skin: Skin is warm and dry. No rash noted. He is not diaphoretic.  Psychiatric: He has a normal mood and affect. His behavior is normal.  Vitals reviewed.   Results for orders placed or performed in visit on 11/23/14  POCT glycosylated hemoglobin (Hb A1C)  Result Value Ref Range   Hemoglobin A1C 5.2       Assessment & Plan:   Problem List Items Addressed This Visit       Endocrine   T2DM (type 2 diabetes mellitus) ([place]) - Primary    Switch to Victoza because his once daily rather than twice daily. Otherwise doing well and continue current medications and will check labs.      Relevant Medications   Liraglutide (VICTOZA) 18 MG/3ML SOPN   pregabalin (LYRICA) 50 MG capsule   Other Relevant Orders   POCT glycosylated hemoglobin (Hb A1C) (Completed)   Microalbumin / creatinine  urine ratio (Completed)   CMP14+EGFR (Completed)   TSH (Completed)     Other   Hyperlipidemia with target LDL less than 100   Relevant Orders   Lipid panel (Completed)    Other Visit Diagnoses    Well adult exam            Follow up plan: Return in about 1 year (around 11/23/2015), or if symptoms worsen or fail to improve.  Counseling provided for all of the vaccine components Orders Placed This Encounter  Procedures  . Microalbumin / creatinine urine ratio  . CMP14+EGFR  . Lipid panel  . TSH  . POCT glycosylated hemoglobin (Hb A1C)    Caryl Pina, MD [facility] 11/23/2014, 11:09 AM

## 2014-11-24 LAB — CMP14+EGFR
ALK PHOS: 91 IU/L (ref 39–117)
ALT: 23 IU/L (ref 0–44)
AST: 27 IU/L (ref 0–40)
Albumin/Globulin Ratio: 1.7 (ref 1.1–2.5)
Albumin: 4.3 g/dL (ref 3.5–5.5)
BILIRUBIN TOTAL: 0.3 mg/dL (ref 0.0–1.2)
BUN/Creatinine Ratio: 12 (ref 9–20)
BUN: 14 mg/dL (ref 6–24)
CHLORIDE: 96 mmol/L — AB (ref 97–106)
CO2: 25 mmol/L (ref 18–29)
CREATININE: 1.16 mg/dL (ref 0.76–1.27)
Calcium: 9.3 mg/dL (ref 8.7–10.2)
GFR calc Af Amer: 81 mL/min/{1.73_m2} (ref >59)
GFR calc non Af Amer: 70 mL/min/{1.73_m2} (ref >59)
GLOBULIN, TOTAL: 2.6 g/dL (ref 1.5–4.5)
GLUCOSE: 94 mg/dL (ref 65–99)
Potassium: 4.6 mmol/L (ref 3.5–5.2)
SODIUM: 137 mmol/L (ref 136–144)
Total Protein: 6.9 g/dL (ref 6.0–8.5)

## 2014-11-24 LAB — MICROALBUMIN / CREATININE URINE RATIO
CREATININE, UR: 52.6 mg/dL
Microalbumin, Urine: <3 ug/mL

## 2014-11-24 LAB — LIPID PANEL
CHOLESTEROL TOTAL: 200 mg/dL — AB (ref 100–199)
Chol/HDL Ratio: 3.3 ratio units (ref 0.0–5.0)
HDL: 60 mg/dL (ref >39)
LDL CALC: 102 mg/dL — AB (ref 0–99)
TRIGLYCERIDES: 192 mg/dL — AB (ref 0–149)
VLDL Cholesterol Cal: 38 mg/dL (ref 5–40)

## 2014-11-24 LAB — TSH: TSH: 0.911 u[IU]/mL (ref 0.450–4.500)

## 2014-11-25 ENCOUNTER — Other Ambulatory Visit: Payer: Self-pay | Admitting: Family Medicine

## 2014-11-27 NOTE — Assessment & Plan Note (Signed)
Switch to Victoza because his once daily rather than twice daily. Otherwise doing well and continue current medications and will check labs.

## 2014-12-16 ENCOUNTER — Other Ambulatory Visit: Payer: Self-pay | Admitting: Family Medicine

## 2015-01-05 ENCOUNTER — Other Ambulatory Visit: Payer: Self-pay | Admitting: Family Medicine

## 2015-01-09 ENCOUNTER — Other Ambulatory Visit: Payer: Self-pay | Admitting: Family Medicine

## 2015-01-15 ENCOUNTER — Other Ambulatory Visit: Payer: Self-pay | Admitting: Family Medicine

## 2015-01-19 ENCOUNTER — Ambulatory Visit (INDEPENDENT_AMBULATORY_CARE_PROVIDER_SITE_OTHER): Payer: BLUE CROSS/BLUE SHIELD | Admitting: Family Medicine

## 2015-01-19 ENCOUNTER — Encounter: Payer: Self-pay | Admitting: Family Medicine

## 2015-01-19 VITALS — BP 132/78 | HR 84 | Temp 98.0°F | Ht 69.5 in | Wt 199.4 lb

## 2015-01-19 DIAGNOSIS — J4551 Severe persistent asthma with (acute) exacerbation: Secondary | ICD-10-CM

## 2015-01-19 DIAGNOSIS — R635 Abnormal weight gain: Secondary | ICD-10-CM

## 2015-01-19 MED ORDER — PREDNISONE 20 MG PO TABS: ORAL_TABLET | ORAL | Status: DC

## 2015-01-19 NOTE — Progress Notes (Signed)
BP 132/78 mmHg  Pulse 84  Temp(Src) 98 F (36.7 C) (Oral)  Ht 5' 9.5" (1.765 m)  Wt 199 lb 6.4 oz (90.447 kg)  BMI 29.03 kg/m2   Subjective:    Patient ID: Johnny Mathews, male    DOB: Jan 08, 1960, 55 y.o.   MRN: [id number]  HPI: Johnny Mathews is a 55 y.o. male presenting on 01/19/2015 for Diabetes   HPI Chest tightness and asthma Patient presents today because he has been having chest tightness and difficulty with wheezing and coughing spells. He's been having symptoms about 6 out of 7 days per week and daily symptoms and daily need for his short acting beta agonists. He continues to use Dulera and Singulair and has nasal steroid spray and Astelin nasal spray and he is still having this chest tightness and difficulty breathing and wheezing. He denies any fevers or chills.  Weight gain Patient has been having persistent weight gain despite change in his diet and exercise levels and he wants to get checked for labs for that. He denies any changes with hair or nails or constipation or diarrhea. He denies any help dictations. He denies any issues with hot or cold.  Relevant past medical, surgical, family and social history reviewed and updated as indicated. Interim medical history since our last visit reviewed. Allergies and medications reviewed and updated.  Review of Systems  Constitutional: Positive for unexpected weight change. Negative for fever and chills.  HENT: Positive for congestion, postnasal drip, rhinorrhea, sinus pressure and sore throat. Negative for ear discharge, ear pain, sneezing and voice change.   Eyes: Negative for pain, discharge, redness and visual disturbance.  Respiratory: Positive for cough, chest tightness, shortness of breath and wheezing.   Cardiovascular: Negative for chest pain and leg swelling.  Gastrointestinal: Negative for abdominal pain, diarrhea and constipation.  Genitourinary: Negative for difficulty urinating.  Musculoskeletal: Negative for  back pain and gait problem.  Skin: Negative for rash.  Neurological: Negative for syncope, light-headedness and headaches.  All other systems reviewed and are negative.   Per HPI unless specifically indicated above     Medication List       This list is accurate as of: 01/19/15  2:55 PM.  Always use your most recent med list.               albuterol 108 (90 Base) MCG/ACT inhaler  Commonly known as:  PROAIR HFA  Inhale 2 puffs into the lungs every 4 (four) hours as needed for wheezing or shortness of breath.     amitriptyline 25 MG tablet  Commonly known as:  ELAVIL  TAKE 3 TABLETS BY MOUTH AT BEDTIME     azelastine 0.1 % nasal spray  Commonly known as:  ASTELIN  USE 2 SPRAYS IN EACH NOSTRIL AT BEDTIME     cyclobenzaprine 5 MG tablet  Commonly known as:  FLEXERIL  Take 1 tablet (5 mg total) by mouth 3 (three) times daily as needed for muscle spasms.     DULERA 100-5 MCG/ACT Aero  Generic drug:  mometasone-formoterol  INHALE 2 PUFFS INTO LUNGS TWICE A DAY     EPINEPHrine 0.3 mg/0.3 mL Devi  Commonly known as:  EPI-PEN  Inject 0.3 mg into the skin as needed.     esomeprazole 40 MG capsule  Commonly known as:  NEXIUM  TAKE ONE (1) CAPSULE EACH DAY     fluticasone 50 MCG/ACT nasal spray  Commonly known as:  FLONASE  USE 1 SPRAY  IN EACH NOSTRIL ONCE DAILY     furosemide 20 MG tablet  Commonly known as:  LASIX  TAKE ONE (1) TABLET EACH DAY     Liraglutide 18 MG/3ML Sopn  Commonly known as:  VICTOZA  Inject 0.3 mLs (1.8 mg total) into the skin daily.     losartan 50 MG tablet  Commonly known as:  COZAAR  TAKE ONE (1) TABLET EACH DAY     pravastatin 40 MG tablet  Commonly known as:  PRAVACHOL  Take 1 tablet (40 mg total) by mouth daily.     predniSONE 20 MG tablet  Commonly known as:  DELTASONE  2 po at same time daily for 5 days     pregabalin 50 MG capsule  Commonly known as:  LYRICA  Take 1 capsule (50 mg total) by mouth 3 (three) times daily.      ranitidine 150 MG tablet  Commonly known as:  ZANTAC  TAKE ONE TABLET BY MOUTH TWICE DAILY     SINGULAIR 10 MG tablet  Generic drug:  montelukast  Take 10 mg by mouth at bedtime.     tamsulosin 0.4 MG Caps capsule  Commonly known as:  FLOMAX  Take 0.4 mg by mouth daily.     TRAVEL SICKNESS 25 MG Chew  Generic drug:  Meclizine HCl  TAKE ONE TABLET DAILY AT BEDTIME     valsartan-hydrochlorothiazide 160-25 MG tablet  Commonly known as:  DIOVAN HCT  Take 1 tablet by mouth daily.           Objective:      Wt Readings from Last 3 Encounters:  01/19/15 199 lb 6.4 oz (90.447 kg)  11/23/14 189 lb 3.2 oz (85.821 kg)  10/02/14 192 lb 3.2 oz (87.181 kg)    Physical Exam  Constitutional: He is oriented to person, place, and time. He appears well-developed and well-nourished. No distress.  Obese  HENT:  Right Ear: Tympanic membrane, external ear and ear canal normal.  Left Ear: Tympanic membrane, external ear and ear canal normal.  Nose: Mucosal edema and rhinorrhea present. No sinus tenderness. No epistaxis. Right sinus exhibits maxillary sinus tenderness. Right sinus exhibits no frontal sinus tenderness. Left sinus exhibits maxillary sinus tenderness. Left sinus exhibits no frontal sinus tenderness.  Mouth/Throat: Uvula is midline and mucous membranes are normal. Posterior oropharyngeal edema and posterior oropharyngeal erythema present. No oropharyngeal exudate or tonsillar abscesses.  Eyes: Conjunctivae and EOM are normal. Pupils are equal, round, and reactive to light. Right eye exhibits no discharge. No scleral icterus.  Neck: Neck supple. No thyromegaly present.  Cardiovascular: Normal rate, regular rhythm, normal heart sounds and intact distal pulses.   No murmur heard. Pulmonary/Chest: Effort normal and breath sounds normal. No respiratory distress. He has no wheezes (No wheezes on exam). He has no rales.  Abdominal: He exhibits no distension.  Musculoskeletal: Normal range  of motion. He exhibits no edema.  Lymphadenopathy:    He has no cervical adenopathy.  Neurological: He is alert and oriented to person, place, and time. Coordination normal.  Skin: Skin is warm and dry. No rash noted. He is not diaphoretic.  Psychiatric: He has a normal mood and affect. His behavior is normal.  Vitals reviewed.       Assessment & Plan:   Problem List Items Addressed This Visit    None    Visit Diagnoses    Asthma with acute exacerbation, severe persistent    -  Primary    We'll give  prednisone and referred to an allergist because of severe asthma that is uncontrolled    Relevant Medications    predniSONE (DELTASONE) 20 MG tablet    Other Relevant Orders    Ambulatory referral to Allergy    Weight gain        Patient feels like he consistently cannot lose weight despite changes in his lifestyle. Like to have some labs checked to make sure it's not a hormonal imbalan.    Relevant Orders    Lipid panel    TSH        Follow up plan: Return in about 2 weeks (around 02/02/2015), or if symptoms worsen or fail to improve, for Recheck asthma.  Counseling provided for all of the vaccine components Orders Placed This Encounter  Procedures  . Lipid panel  . TSH  . Ambulatory referral to [place], MD [facility] 01/19/2015, 2:55 PM

## 2015-02-02 ENCOUNTER — Other Ambulatory Visit: Payer: Self-pay | Admitting: Family Medicine

## 2015-02-02 ENCOUNTER — Encounter: Payer: Self-pay | Admitting: Family Medicine

## 2015-02-02 ENCOUNTER — Ambulatory Visit (INDEPENDENT_AMBULATORY_CARE_PROVIDER_SITE_OTHER): Payer: BLUE CROSS/BLUE SHIELD | Admitting: Family Medicine

## 2015-02-02 VITALS — BP 128/87 | HR 66 | Temp 97.5°F | Ht 69.5 in | Wt 198.8 lb

## 2015-02-02 DIAGNOSIS — R079 Chest pain, unspecified: Secondary | ICD-10-CM

## 2015-02-02 NOTE — Progress Notes (Signed)
BP 128/87 mmHg  Pulse 66  Temp(Src) 97.5 F (36.4 C) (Oral)  Ht 5' 9.5" (1.765 m)  Wt 198 lb 12.8 oz (90.175 kg)  BMI 28.95 kg/m2   Subjective:    Patient ID: Johnny Mathews, male    DOB: 1960-06-03, 55 y.o.   MRN: [id number]  HPI: Johnny Mathews is a 55 y.o. male presenting on 02/02/2015 for Chest Pain   HPI Chest pain Patient has been having chest pain for the past 2 or 3 days that is described as substernal and radiating to his left jaw and arm. And has been more of a pressure. The pain started initially when he was hiking. The pain that was radiating into the jaw and into his arm has since subsided but he is still having some tightness in his chest and describes some shortness of breath. He does have a known history of muscular dystrophy that puts him at increased risk for cardiac events. He did go to the another physician of his 2 days ago and had an EKG which was described as normal.he denies any fevers or chills or upper respiratory symptoms or cough. He denies any pain radiating anywhere else. He has taken an aspirin  Relevant past medical, surgical, family and social history reviewed and updated as indicated. Interim medical history since our last visit reviewed. Allergies and medications reviewed and updated.  Review of Systems  Constitutional: Negative for fever and chills.  HENT: Negative for ear discharge, ear pain, rhinorrhea, sinus pressure, sneezing and sore throat.   Eyes: Negative for discharge and visual disturbance.  Respiratory: Positive for chest tightness and shortness of breath. Negative for cough and wheezing.   Cardiovascular: Positive for chest pain. Negative for palpitations and leg swelling.  Gastrointestinal: Negative for abdominal pain, diarrhea and constipation.  Genitourinary: Negative for difficulty urinating.  Musculoskeletal: Negative for back pain and gait problem.  Skin: Negative for rash.  Neurological: Negative for dizziness, syncope,  light-headedness and headaches.  All other systems reviewed and are negative.   Per HPI unless specifically indicated above     Medication List       This list is accurate as of: 02/02/15  2:28 PM.  Always use your most recent med list.               albuterol 108 (90 Base) MCG/ACT inhaler  Commonly known as:  PROAIR HFA  Inhale 2 puffs into the lungs every 4 (four) hours as needed for wheezing or shortness of breath.     amitriptyline 25 MG tablet  Commonly known as:  ELAVIL  TAKE 3 TABLETS BY MOUTH AT BEDTIME     azelastine 0.1 % nasal spray  Commonly known as:  ASTELIN  USE 2 SPRAYS IN EACH NOSTRIL AT BEDTIME     DULERA 100-5 MCG/ACT Aero  Generic drug:  mometasone-formoterol  INHALE 2 PUFFS INTO LUNGS TWICE A DAY     EPINEPHrine 0.3 mg/0.3 mL Devi  Commonly known as:  EPI-PEN  Inject 0.3 mg into the skin as needed.     esomeprazole 40 MG capsule  Commonly known as:  NEXIUM  TAKE ONE (1) CAPSULE EACH DAY     fluticasone 50 MCG/ACT nasal spray  Commonly known as:  FLONASE  USE 1 SPRAY IN EACH NOSTRIL ONCE DAILY     furosemide 20 MG tablet  Commonly known as:  LASIX  TAKE ONE (1) TABLET EACH DAY     Liraglutide 18 MG/3ML Sopn  Commonly  known as:  VICTOZA  Inject 0.3 mLs (1.8 mg total) into the skin daily.     losartan 50 MG tablet  Commonly known as:  COZAAR  TAKE ONE (1) TABLET EACH DAY     pravastatin 40 MG tablet  Commonly known as:  PRAVACHOL  Take 1 tablet (40 mg total) by mouth daily.     predniSONE 20 MG tablet  Commonly known as:  DELTASONE  2 po at same time daily for 5 days     pregabalin 50 MG capsule  Commonly known as:  LYRICA  Take 1 capsule (50 mg total) by mouth 3 (three) times daily.     ranitidine 150 MG tablet  Commonly known as:  ZANTAC  TAKE ONE TABLET BY MOUTH TWICE DAILY     SINGULAIR 10 MG tablet  Generic drug:  montelukast  Take 10 mg by mouth at bedtime.     tamsulosin 0.4 MG Caps capsule  Commonly known as:   FLOMAX  Take 0.4 mg by mouth daily.     TRAVEL SICKNESS 25 MG Chew  Generic drug:  Meclizine HCl  TAKE ONE TABLET DAILY AT BEDTIME     valsartan-hydrochlorothiazide 160-25 MG tablet  Commonly known as:  DIOVAN HCT  Take 1 tablet by mouth daily.           Objective:    BP 128/87 mmHg  Pulse 66  Temp(Src) 97.5 F (36.4 C) (Oral)  Ht 5' 9.5" (1.765 m)  Wt 198 lb 12.8 oz (90.175 kg)  BMI 28.95 kg/m2  Wt Readings from Last 3 Encounters:  02/02/15 198 lb 12.8 oz (90.175 kg)  01/19/15 199 lb 6.4 oz (90.447 kg)  11/23/14 189 lb 3.2 oz (85.821 kg)    Physical Exam  Constitutional: He is oriented to person, place, and time. He appears well-developed and well-nourished. No distress.  Eyes: Conjunctivae and EOM are normal. Pupils are equal, round, and reactive to light. Right eye exhibits no discharge. No scleral icterus.  Neck: Neck supple. No thyromegaly present.  Cardiovascular: Normal rate, regular rhythm, normal heart sounds and intact distal pulses.  Exam reveals no friction rub.   No murmur heard. Pulmonary/Chest: Effort normal and breath sounds normal. No respiratory distress. He has no wheezes.  Musculoskeletal: Normal range of motion. He exhibits no edema.  Lymphadenopathy:    He has no cervical adenopathy.  Neurological: He is alert and oriented to person, place, and time. Coordination normal.  Skin: Skin is warm and dry. No rash noted. He is not diaphoretic.  Psychiatric: He has a normal mood and affect. His behavior is normal.  Nursing note and vitals reviewed.     Assessment & Plan:   Problem List Items Addressed This Visit    None    Visit Diagnoses    Chest pain, unspecified chest pain type    -  Primary    had CP and now is SOB, concerning for cardiac, call for stat referral to Cards    Relevant Orders    Ambulatory referral to Cardiology        Follow up plan: Return if symptoms worsen or fail to improve.  Counseling provided for all of the vaccine  components Orders Placed This Encounter  Procedures  . Ambulatory referral to Cardiology    Caryl Pina, MD [facility] 02/02/2015, 2:28 PM

## 2015-02-05 ENCOUNTER — Ambulatory Visit (INDEPENDENT_AMBULATORY_CARE_PROVIDER_SITE_OTHER): Payer: BLUE CROSS/BLUE SHIELD | Admitting: Cardiology

## 2015-02-05 ENCOUNTER — Encounter: Payer: Self-pay | Admitting: Cardiology

## 2015-02-05 VITALS — BP 118/80 | HR 82 | Ht 69.0 in | Wt 199.2 lb

## 2015-02-05 DIAGNOSIS — G7100 Muscular dystrophy, unspecified: Secondary | ICD-10-CM

## 2015-02-05 DIAGNOSIS — R0600 Dyspnea, unspecified: Secondary | ICD-10-CM | POA: Diagnosis not present

## 2015-02-05 DIAGNOSIS — E119 Type 2 diabetes mellitus without complications: Secondary | ICD-10-CM

## 2015-02-05 DIAGNOSIS — R079 Chest pain, unspecified: Secondary | ICD-10-CM | POA: Diagnosis not present

## 2015-02-05 DIAGNOSIS — I10 Essential (primary) hypertension: Secondary | ICD-10-CM

## 2015-02-05 DIAGNOSIS — E785 Hyperlipidemia, unspecified: Secondary | ICD-10-CM

## 2015-02-05 DIAGNOSIS — E1159 Type 2 diabetes mellitus with other circulatory complications: Secondary | ICD-10-CM | POA: Insufficient documentation

## 2015-02-05 DIAGNOSIS — J4520 Mild intermittent asthma, uncomplicated: Secondary | ICD-10-CM

## 2015-02-05 NOTE — Progress Notes (Signed)
Cardiology Office Note    Date:  02/05/2015   ID:  Johnny Mathews, Johnny Mathews [birth year], MRN [id number]  PCP:  Johnny Rancher, MD  Cardiologist:   Johnny Furbish, MD     History of Present Illness:  Johnny Mathews is a 55 y.o. male here for the evaluation of chest pain at the request of Dr. Warrick Mathews.  He has been having difficulty since he hiked [place]. Quite strenuous. Pushed through however. The next day he felt central chest discomfort with radiation to his jaw and right arm. This was worrisome to him. Since then, he is felt continuous chest pressure at times. Exertion does not seem to make this worse. He has had shortness of breath for "for a while now ". Predates chest discomfort. Has seen pulmonary medicine. Is on inhalers.  He has myocardial myopathy and was told by this physician to see his cardiologist given his symptoms.  +DM, nonsmoker, father had bypass surgery. He works as a Physiological scientist.      Past Medical History  Diagnosis Date  . Asthma   . Hyperlipidemia   . Muscular dystrophy ([place])   . GERD (gastroesophageal reflux disease)   . BPH (benign prostatic hypertrophy)   . Diabetes mellitus without complication [facility])     Past Surgical History  Procedure Laterality Date  . Shoulder open rotator cuff repair    . Knee surgery Right   . Kidney stone surgery      Outpatient Prescriptions Prior to Visit  Medication Sig Dispense Refill  . albuterol (PROAIR HFA) 108 (90 BASE) MCG/ACT inhaler Inhale 2 puffs into the lungs every 4 (four) hours as needed for wheezing or shortness of breath. 1 Inhaler 11  . amitriptyline (ELAVIL) 25 MG tablet TAKE 3 TABLETS BY MOUTH AT BEDTIME 90 tablet 2  . azelastine (ASTELIN) 0.1 % nasal spray USE 2 SPRAYS IN EACH NOSTRIL AT BEDTIME 30 mL 4  . DULERA 100-5 MCG/ACT AERO INHALE 2 PUFFS INTO LUNGS TWICE A DAY 13 g 3  . EPINEPHrine (EPI-PEN) 0.3 mg/0.3 mL DEVI Inject 0.3 mg into the skin as needed.     Marland Kitchen esomeprazole  (NEXIUM) 40 MG capsule TAKE ONE (1) CAPSULE EACH DAY 30 capsule 5  . fluticasone (FLONASE) 50 MCG/ACT nasal spray USE 1 SPRAY IN EACH NOSTRIL ONCE DAILY 16 g 3  . furosemide (LASIX) 20 MG tablet TAKE ONE (1) TABLET EACH DAY 30 tablet 3  . Liraglutide (VICTOZA) 18 MG/3ML SOPN Inject 0.3 mLs (1.8 mg total) into the skin daily. 30 pen 11  . losartan (COZAAR) 50 MG tablet TAKE ONE (1) TABLET EACH DAY 30 tablet 4  . montelukast (SINGULAIR) 10 MG tablet Take 10 mg by mouth at bedtime.    . pravastatin (PRAVACHOL) 40 MG tablet Take 1 tablet (40 mg total) by mouth daily. 30 tablet 11  . pregabalin (LYRICA) 50 MG capsule Take 1 capsule (50 mg total) by mouth 3 (three) times daily. 90 capsule 2  . ranitidine (ZANTAC) 150 MG tablet TAKE ONE TABLET BY MOUTH TWICE DAILY 60 tablet 3  . tamsulosin (FLOMAX) 0.4 MG CAPS capsule Take 0.4 mg by mouth daily.    . valsartan-hydrochlorothiazide (DIOVAN HCT) 160-25 MG per tablet Take 1 tablet by mouth daily. 30 tablet 11  . TRAVEL SICKNESS 25 MG CHEW TAKE ONE TABLET DAILY AT BEDTIME 30 each 0  . predniSONE (DELTASONE) 20 MG tablet 2 po at same time daily for 5 days 10 tablet 0  No facility-administered medications prior to visit.     Allergies:   Eggs or egg-derived products; Septra; and Sulfonamide derivatives   Social History   Social History  . Marital Status: Married    Spouse Name: N/A  . Number of Children: N/A  . Years of Education: N/A   Occupational History  . Physiological scientist    Social History Main Topics  . Smoking status: Former Smoker -- 1.00 packs/day for 10 years    Types: Cigarettes    Quit date: 01/06/1994  . Smokeless tobacco: Never Used  . Alcohol Use: 0.0 oz/week    0 Standard drinks or equivalent per week     Comment: social  . Drug Use: No  . Sexual Activity: Not Asked   Other Topics Concern  . None   Social History Narrative     Family History:  The patient's family history includes Breast cancer in his mother and  sister; Heart disease in his father.   Father CABG x 4  ROS:   Please see the history of present illness.    ROS denies any syncope, bleeding, orthopnea, PND, rash.  All other systems reviewed and are negative.   PHYSICAL EXAM:   VS:  BP 118/80 mmHg  Pulse 82  Ht 5\' 9"  (1.753 m)  Wt 199 lb 3.2 oz (90.357 kg)  BMI 29.40 kg/m2   GEN: Well nourished, well developed, in no acute distress HEENT: normal Neck: no JVD, carotid bruits, or masses Cardiac: RRR, has occasional bouts of ectopy appreciated, auscultation sounds like short burst of atrial tachycardia ; 1/6 systolic murmur right upper sternal border,  no rubs, or gallops,no edema  Respiratory:  clear to auscultation bilaterally, normal work of breathing GI: soft, nontender, nondistended, + BS MS: no deformity or atrophy Skin: warm and dry, no rash Neuro:  Alert and Oriented x 3, Strength and sensation are intact Psych: euthymic mood, full affect, mildly anxious, repetitive motion at times raising eyebrows   Wt Readings from Last 3 Encounters:  02/05/15 199 lb 3.2 oz (90.357 kg)  02/02/15 198 lb 12.8 oz (90.175 kg)  01/19/15 199 lb 6.4 oz (90.447 kg)      Studies/Labs Reviewed:   EKG:  EKG is ordered today.  The ekg ordered today demonstrates  02/05/15-sinus rhythmnspecific ST-T wave changes  Recent Labs: 11/23/2014: ALT 23; BUN 14; Creatinine, Ser 1.16; Potassium 4.6; Sodium 137; TSH 0.911   Lipid Panel    Component Value Date/Time   CHOL 200* 11/23/2014 1114   CHOL  03/30/2009 1807    200        ATP III CLASSIFICATION:  <200     mg/dL   Desirable  200-239  mg/dL   Borderline High  >=240    mg/dL   High          TRIG 192* 11/23/2014 1114   HDL 60 11/23/2014 1114   HDL 42 03/30/2009 1807   CHOLHDL 3.3 11/23/2014 1114   CHOLHDL 4.8 03/30/2009 1807   VLDL 20 03/30/2009 1807   LDLCALC 102* 11/23/2014 1114   LDLCALC * 03/30/2009 1807    138        Total Cholesterol/HDL:CHD Risk Coronary Heart Disease Risk  Table                     Men   Women  1/2 Average Risk   3.4   3.3  Average Risk       5.0   4.4  2 X Average Risk   9.6   7.1  3 X Average Risk  23.4   11.0        Use the calculated Patient Ratio above and the CHD Risk Table to determine the patient's CHD Risk.        ATP III CLASSIFICATION (LDL):  <100     mg/dL   Optimal  100-129  mg/dL   Near or Above                    Optimal  130-159  mg/dL   Borderline  160-189  mg/dL   High  >190     mg/dL   Very High    Additional studies/ records that were reviewed today include:  Prior office notes, lab work, EKG    ASSESSMENT:    1. Chest pain, unspecified chest pain type   2. Dyspnea   3. Muscular dystrophy ([place])   4. Hyperlipidemia with target LDL less than 100   5. Asthma, mild intermittent, uncomplicated   6. Diabetes mellitus without complication ([place])   7. Essential hypertension      PLAN:  In order of problems listed above:  1. Given his new onset of chest pressure, jaw radiation, right arm radiation, we will go ahead and perform nuclear stress test to exclude any possibility of ischemia. I will also check an echocardiogram to ensure proper structure and function of his heart. He has had dyspnea for quite some time, and I also hear a systolic murmur on exam. 2. He also has palpitations, PACs heard on auscultation as well as possible paroxysmal atrial tachycardia. He is on no AV nodal blocking agents at this time. Usually these are benign. Continue to follow. 3. His muscular dystrophy puts him at increased cardiac risk. Checking stress test as well as echocardiogram. 4. Continue with diabetes care 5. Continue with antihypertensives listed above.    Medication Adjustments/Labs and Tests Ordered: Current medicines are reviewed at length with the patient today.  Concerns regarding medicines are outlined above.  Medication changes, Labs and Tests ordered today are listed in the Patient Instructions below. Patient  Instructions  Medication Instructions:  NO CHANGES  Labwork: NONE  Testing/Procedures: Your physician has requested that you have en exercise stress myoview. For further information please visit [email or web address]. Please follow instruction sheet, as given. Your physician has requested that you have an echocardiogram. Echocardiography is a painless test that uses sound waves to create images of your heart. It provides your doctor with information about the size and shape of your heart and how well your heart's chambers and valves are working. This procedure takes approximately one hour. There are no restrictions for this procedure.    Follow-Up:PENDING  TEST RESULTS  Any Other Special Instructions Will Be Listed Below (If Applicable).     If you need a refill on your cardiac medications before your next appointment, please call your pharmacy.         Bobby Rumpf, MD  02/05/2015 8:27 AM    [address], [place], [place]  [address] Phone: [phone]; Fax: [phone]

## 2015-02-05 NOTE — Patient Instructions (Signed)
Medication Instructions:  NO CHANGES  Labwork: NONE  Testing/Procedures: Your physician has requested that you have en exercise stress myoview. For further information please visit [email or web address]. Please follow instruction sheet, as given. Your physician has requested that you have an echocardiogram. Echocardiography is a painless test that uses sound waves to create images of your heart. It provides your doctor with information about the size and shape of your heart and how well your heart's chambers and valves are working. This procedure takes approximately one hour. There are no restrictions for this procedure.    Follow-Up:PENDING  TEST RESULTS  Any Other Special Instructions Will Be Listed Below (If Applicable).     If you need a refill on your cardiac medications before your next appointment, please call your pharmacy.

## 2015-02-14 ENCOUNTER — Telehealth (HOSPITAL_COMMUNITY): Payer: Self-pay | Admitting: *Deleted

## 2015-02-14 NOTE — Telephone Encounter (Signed)
Left message on voicemail in reference to upcoming appointment scheduled for 02/19/15. Phone number given for a call back so details instructions can be given. Docie Abramovich J Ramandeep Arington, RN 

## 2015-02-15 ENCOUNTER — Telehealth (HOSPITAL_COMMUNITY): Payer: Self-pay | Admitting: *Deleted

## 2015-02-15 NOTE — Telephone Encounter (Signed)
Left message on voicemail in reference to upcoming appointment scheduled for 02/19/15. Phone number given for a call back so details instructions can be given. Ivyrose Hashman J Naoko Diperna, RN 

## 2015-02-19 ENCOUNTER — Ambulatory Visit (HOSPITAL_BASED_OUTPATIENT_CLINIC_OR_DEPARTMENT_OTHER): Payer: BLUE CROSS/BLUE SHIELD

## 2015-02-19 ENCOUNTER — Ambulatory Visit (HOSPITAL_COMMUNITY): Payer: BLUE CROSS/BLUE SHIELD | Attending: Internal Medicine

## 2015-02-19 ENCOUNTER — Other Ambulatory Visit: Payer: Self-pay

## 2015-02-19 DIAGNOSIS — Z8249 Family history of ischemic heart disease and other diseases of the circulatory system: Secondary | ICD-10-CM | POA: Diagnosis not present

## 2015-02-19 DIAGNOSIS — R0600 Dyspnea, unspecified: Secondary | ICD-10-CM | POA: Insufficient documentation

## 2015-02-19 DIAGNOSIS — E119 Type 2 diabetes mellitus without complications: Secondary | ICD-10-CM | POA: Insufficient documentation

## 2015-02-19 DIAGNOSIS — R0609 Other forms of dyspnea: Secondary | ICD-10-CM | POA: Insufficient documentation

## 2015-02-19 DIAGNOSIS — R079 Chest pain, unspecified: Secondary | ICD-10-CM | POA: Diagnosis not present

## 2015-02-19 DIAGNOSIS — R0602 Shortness of breath: Secondary | ICD-10-CM | POA: Insufficient documentation

## 2015-02-19 DIAGNOSIS — R9439 Abnormal result of other cardiovascular function study: Secondary | ICD-10-CM | POA: Insufficient documentation

## 2015-02-19 DIAGNOSIS — I10 Essential (primary) hypertension: Secondary | ICD-10-CM | POA: Insufficient documentation

## 2015-02-19 DIAGNOSIS — R002 Palpitations: Secondary | ICD-10-CM | POA: Diagnosis not present

## 2015-02-19 DIAGNOSIS — I517 Cardiomegaly: Secondary | ICD-10-CM | POA: Insufficient documentation

## 2015-02-19 LAB — MYOCARDIAL PERFUSION IMAGING
CHL CUP NUCLEAR SRS: 3
CHL CUP NUCLEAR SSS: 3
CSEPEW: 12.5 METS
CSEPHR: 95 %
CSEPPHR: 157 {beats}/min
Exercise duration (min): 10 min
Exercise duration (sec): 30 s
LHR: 0.29
MPHR: 164 {beats}/min
NUC STRESS TID: 1.04
Rest HR: 93 {beats}/min
SDS: 0

## 2015-02-19 MED ORDER — TECHNETIUM TC 99M SESTAMIBI GENERIC - CARDIOLITE
33.0000 | Freq: Once | INTRAVENOUS | Status: AC | PRN
  Administered 2015-02-19: 33 via INTRAVENOUS

## 2015-02-19 MED ORDER — TECHNETIUM TC 99M SESTAMIBI GENERIC - CARDIOLITE
10.5000 | Freq: Once | INTRAVENOUS | Status: AC | PRN
  Administered 2015-02-19: 11 via INTRAVENOUS

## 2015-02-20 ENCOUNTER — Ambulatory Visit: Payer: BLUE CROSS/BLUE SHIELD | Admitting: Allergy and Immunology

## 2015-03-03 ENCOUNTER — Other Ambulatory Visit: Payer: Self-pay | Admitting: Family Medicine

## 2015-03-05 NOTE — Telephone Encounter (Signed)
Last seen 02/02/15  Dr Dettinger  If approved print

## 2015-03-09 ENCOUNTER — Ambulatory Visit (INDEPENDENT_AMBULATORY_CARE_PROVIDER_SITE_OTHER): Payer: BLUE CROSS/BLUE SHIELD | Admitting: Family Medicine

## 2015-03-09 ENCOUNTER — Encounter: Payer: Self-pay | Admitting: Family Medicine

## 2015-03-09 VITALS — BP 141/87 | HR 66 | Ht 69.0 in | Wt 199.0 lb

## 2015-03-09 DIAGNOSIS — M129 Arthropathy, unspecified: Secondary | ICD-10-CM

## 2015-03-09 DIAGNOSIS — M25511 Pain in right shoulder: Secondary | ICD-10-CM

## 2015-03-09 DIAGNOSIS — M19011 Primary osteoarthritis, right shoulder: Secondary | ICD-10-CM

## 2015-03-09 DIAGNOSIS — M75101 Unspecified rotator cuff tear or rupture of right shoulder, not specified as traumatic: Secondary | ICD-10-CM | POA: Diagnosis not present

## 2015-03-09 DIAGNOSIS — M7541 Impingement syndrome of right shoulder: Secondary | ICD-10-CM

## 2015-03-09 MED ORDER — METHYLPREDNISOLONE ACETATE 40 MG/ML IJ SUSP
40.0000 mg | Freq: Once | INTRAMUSCULAR | Status: AC
  Administered 2015-03-09: 40 mg via INTRA_ARTICULAR

## 2015-03-12 NOTE — Progress Notes (Signed)
Johnny Mathews - 55 y.o. male MRN [id number]  Date of birth: 12-Feb-1960  CC: Right Shoulder Pain  SUBJECTIVE:   HPI  55 yo Male with muscular dystrophy and history of remote RC repair on the right presents with right sided shoulder pain.  Hx of shoulder pain in the past.  The pain radiates distally occasionally after overuse and once to his 4th/5th digits. 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  No trauma.  Hx of similar pain when last seen in 2015 treated with rehab and nitro.  + night time pain.   ROS:     As above, no fevers, chills, night sweats. No weight loss. No recent surgery.   HISTORY: Past Medical, Surgical, Social, and Family History Reviewed & Updated per EMR.  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 Right sided shoulder surgery: RC repair.   OBJECTIVE: BP 141/87 mmHg  Pulse 66  Ht 5\' 9"  (1.753 m)  Wt 199 lb (90.266 kg)  BMI 29.37 kg/m2  Physical Exam  Calm, NAD Non-labored breathing  Neck: Negative spurling's Full neck range of motion Grip strength and sensation normal in bilateral hands Strength good C4 to T1 distribution No sensory change to C4 to T1 Reflexes normal Negative tinel's in cubital tunnel.   Shoulder: right.  Inspection reveals no abnormalities, atrophy or asymmetry. Protracted  Palpation with mild soreness over the [facility] joint  ROM is full in all planes.  Rotator cuff strength normal throughout. + signs of impingement with + Neer and Hawkin's tests, empty can. Speeds and Yergason's tests normal. No labral pathology noted with negative Obrien's, negative clunk and good stability. Poor scapular function observed. + painful arc and no drop arm sign. No apprehension sign  Imaging: [place] image of the R shoulder in both long and short axis obtained. Muscle shows diffuse fatty infiltration.  Tendons appear thinned and hyperechoic. Biceps tendon present without  surrounding effusion. Subscapularis appears relatively normal without obvious tears or abnormalities. The supraspinatus tendon appears normal with no tears and a good footprint.  The infraspinatus and teres minor tendons appear relatively normal  (other than tendinous change seen elsewhere). with no tears and a good footprints. The subdeltoid/subacromial bursa is unremarkable and shows no impingement with dynamic motion. The [facility] joint appears to have + mushrooming with fibrotic changes. Exam c/w Muscular dystrophy with chronic tendinous changes and no acute tear.    Procedure Right Subacromial Injection: The procedure was explained to the patient in detail, all questions were answered. After obtaining informed verbal consent, the right shoulder was sterilely prepped with alcohol. Using strict sterile technique, a 25 gauge, 1 inch needle was inserted inferior to the posterolateral edge of the right acromion. The needle is directed medially and slightly anteriorly. A mixture of 4 mL 1% Xylocaine and 80mg  methylpred was injected without resistance. The needle was removed, and a sterile bandage was applied. The patient tolerated the procedure well and was observed for 5-10 minutes. Post injection instructions, including signs and symptoms of complications were discussed.  Procedure Right AC Joint Injection:After obtaining verbal consent the skin was cleansed with alcohol and Betadine. A 1-2 mixture of 40 mg methylprednisolone and 1cc of 1% lidocaine plain was subsequently injected into the right a.c. joint using a 25-gauge short needle under ultrasound guidance. The medication was visualized filling the AC joint capsule. The patient tolerated the procedure. No complications. Prior to the procedure risks, benefits and treatment  alternatives were discussed.  MEDICATIONS, LABS & OTHER ORDERS: Previous Medications   ALBUTEROL (PROAIR HFA) 108 (90 BASE) MCG/ACT INHALER    Inhale 2 puffs into the lungs  every 4 (four) hours as needed for wheezing or shortness of breath.   AMITRIPTYLINE (ELAVIL) 25 MG TABLET    TAKE 3 TABLETS BY MOUTH AT BEDTIME   AZELASTINE (ASTELIN) 0.1 % NASAL SPRAY    USE 2 SPRAYS IN EACH NOSTRIL AT BEDTIME   DULERA 100-5 MCG/ACT AERO    USE 2 PUFFS TWICE DAILY   EPINEPHRINE (EPI-PEN) 0.3 MG/0.3 ML DEVI    Inject 0.3 mg into the skin as needed.    ESOMEPRAZOLE (NEXIUM) 40 MG CAPSULE    TAKE ONE (1) CAPSULE EACH DAY   FLUTICASONE (FLONASE) 50 MCG/ACT NASAL SPRAY    USE 1 SPRAY IN EACH NOSTRIL ONCE DAILY   FUROSEMIDE (LASIX) 20 MG TABLET    TAKE ONE (1) TABLET EACH DAY   LIRAGLUTIDE (VICTOZA) 18 MG/3ML SOPN    Inject 0.3 mLs (1.8 mg total) into the skin daily.   LOSARTAN (COZAAR) 50 MG TABLET    TAKE ONE (1) TABLET EACH DAY   LYRICA 50 MG CAPSULE    TAKE ONE (1) CAPSULE THREE (3) TIMES EACH DAY   MONTELUKAST (SINGULAIR) 10 MG TABLET    Take 10 mg by mouth at bedtime.   PRAVASTATIN (PRAVACHOL) 40 MG TABLET    Take 1 tablet (40 mg total) by mouth daily.   RANITIDINE (ZANTAC) 150 MG TABLET    TAKE ONE TABLET BY MOUTH TWICE DAILY   TAMSULOSIN (FLOMAX) 0.4 MG CAPS CAPSULE    Take 0.4 mg by mouth daily.   TRAVEL SICKNESS 25 MG CHEW    TAKE ONE TABLET DAILY AT BEDTIME   VALSARTAN-HYDROCHLOROTHIAZIDE (DIOVAN HCT) 160-25 MG PER TABLET    Take 1 tablet by mouth daily.   Modified Medications   No medications on file   New Prescriptions   No medications on file   Discontinued Medications   No medications on file  No orders of the defined types were placed in this encounter.   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 -Subacromial injection and AC joint injections today helped -Therapeutic exercises for shoulder given to start about 5 days after injections - Recommended scapular stabilizing muscle work as well. Scapular protraction.  - May have increased rehab difficulty due to Muscular dystrophy, which showed showed visible manifestations on u/s  today.  - Consider ntg at next visit.  -Follow-up in 3 months or sooner if needed

## 2015-04-06 ENCOUNTER — Other Ambulatory Visit: Payer: Self-pay | Admitting: Family Medicine

## 2015-04-09 NOTE — Telephone Encounter (Signed)
Last seen 02/02/15  Dr Dettinger  If approved route to nurse to call into [facility]

## 2015-04-30 ENCOUNTER — Other Ambulatory Visit: Payer: Self-pay | Admitting: Family Medicine

## 2015-05-15 ENCOUNTER — Other Ambulatory Visit: Payer: Self-pay | Admitting: Family Medicine

## 2015-05-30 ENCOUNTER — Other Ambulatory Visit: Payer: Self-pay | Admitting: Family Medicine

## 2015-06-16 ENCOUNTER — Other Ambulatory Visit: Payer: Self-pay | Admitting: Family Medicine

## 2015-06-18 ENCOUNTER — Ambulatory Visit (INDEPENDENT_AMBULATORY_CARE_PROVIDER_SITE_OTHER): Payer: BLUE CROSS/BLUE SHIELD | Admitting: Family

## 2015-06-18 ENCOUNTER — Encounter: Payer: Self-pay | Admitting: Family

## 2015-06-18 VITALS — BP 120/60 | HR 68 | Temp 99.0°F | Ht 69.0 in | Wt 195.8 lb

## 2015-06-18 DIAGNOSIS — M2550 Pain in unspecified joint: Secondary | ICD-10-CM | POA: Diagnosis not present

## 2015-06-18 DIAGNOSIS — R5383 Other fatigue: Secondary | ICD-10-CM | POA: Diagnosis not present

## 2015-06-18 DIAGNOSIS — S30861A Insect bite (nonvenomous) of abdominal wall, initial encounter: Secondary | ICD-10-CM | POA: Diagnosis not present

## 2015-06-18 DIAGNOSIS — W57XXXA Bitten or stung by nonvenomous insect and other nonvenomous arthropods, initial encounter: Secondary | ICD-10-CM

## 2015-06-18 MED ORDER — DOXYCYCLINE HYCLATE 100 MG PO TABS: 100.0000 mg | ORAL_TABLET | Freq: Two times a day (BID) | ORAL | Status: DC

## 2015-06-18 NOTE — Progress Notes (Signed)
   Subjective:    Patient ID: Johnny Mathews, male    DOB: 17-Sep-1960, 55 y.o.   MRN: [id number]  HPI Pt presents to the office with generalized fatigue and joint pain that has gradually worsen over the last two weeks. PT states he has pulled off 4-5 ticks off of his abdomen and leg over the last two weeks. PT states he feels "sick" and just wants to be checked out. PT states he has tried  Rest with no relief. PT states he called out of work this week for the first time in 24 years,  Because he just "could not get out of bed".    Review of Systems  Constitutional: Positive for fatigue.  Musculoskeletal: Positive for arthralgias, gait problem and neck pain.  All other systems reviewed and are negative.      Objective:   Physical Exam  Constitutional: He is oriented to person, place, and time. He appears well-developed and well-nourished. No distress.  HENT:  Head: Normocephalic.  Eyes: Pupils are equal, round, and reactive to light. Right eye exhibits no discharge. Left eye exhibits no discharge.  Neck: Normal range of motion. Neck supple. No thyromegaly present.  Cardiovascular: Normal rate, regular rhythm, normal heart sounds and intact distal pulses.   No murmur heard. Pulmonary/Chest: Effort normal and breath sounds normal. No respiratory distress. He has no wheezes.  Abdominal: Soft. Bowel sounds are normal. He exhibits no distension. There is no tenderness.  Musculoskeletal: Normal range of motion. He exhibits no edema or tenderness.  Neurological: He is alert and oriented to person, place, and time. He has normal reflexes. No cranial nerve deficit.  Skin: Skin is warm and dry. No rash noted. No erythema.  Psychiatric: He has a normal mood and affect. His behavior is normal. Judgment and thought content normal.  Vitals reviewed.     BP 120/60 mmHg  Pulse 68  Temp(Src) 99 F (37.2 C) (Oral)  Ht _0  (1.753 m)  Wt 195 lb 12.8 oz (88.814 kg)  BMI 28.90 kg/m2       Assessment & Plan:  1. Other fatigue - CMP14+EGFR - Lyme Ab/Western Blot Reflex - Rocky mtn spotted fvr abs pnl(IgG+IgM) - Alpha-Gal Panel - Anemia Profile B - Thyroid Panel With TSH  2. Tick bite of abdomen, initial encounter --Pt to report any new fever, joint pain, or rash -Wear protective clothing while outside- Long sleeves and long pants -Put insect repellent on all exposed skin and along clothing -Take a shower as soon as possible after being outside -RTO prn - CMP14+EGFR - Lyme Ab/Western Blot Reflex - [place] mtn spotted fvr abs pnl(IgG+IgM) - Alpha-Gal Panel  3. Joint pain - CMP14+EGFR  Labs pending  Pt started on doxycycline today because of the new onset of joint pain, fever, and fatigue. RTO prn   Evelina Dun, FNP

## 2015-06-18 NOTE — Patient Instructions (Signed)
[place] Spotted Fever [place] spotted fever is an illness that is spread to people by infected ticks. The illness causes flulike symptoms and a reddish-purple rash. This illness can quickly become very serious. Treatment must be started right away. When the illness is not treated right away, it can sometimes lead to long-term health problems or even death. This illness is most common during warm weather when ticks are most active. CA[facility]ES [facility] spotted fever is caused by a type of bacteria that is called Rickettsia rickettsii. This type of bacteria is carried by [place] dog ticks and [employer]. People get infected through a bite from a tick that is infected with the bacteria. The bite is painless, and it frequently goes unnoticed. The bacteria can also infect a person when tick blood or tick feces get into a person's body through damaged skin. A tick bite is not necessary for an infection to occur. People can get [facility] spotted fever if they get a tick's blood or body fluids on their skin in the area of a small cut or sore. This could happen while removing a tick from another person or a dog. The infection is not contagious, and it cannot be spread (transmitted) from person to person. SIGNS AND SYMPTOMS Symptoms may begin 2-14 days after a tick bite. The most common early symptoms are:  Fever.  Muscle aches.  Headache.  Nausea.  Vomiting.  Poor appetite.  Abdominal pain. The reddish-purple rash usually appears 3-5 days after the first symptoms begin. The rash often starts on the wrists and ankles. It may then spread to the palms, the soles of the feet, the legs, and the trunk. DIAGNOSIS Diagnosis is based on a physical exam, medical history, and blood tests. Your health care provider may suspect [facility] spotted fever in one of these cases:   If you have recently been bitten by a tick.  If you have been in areas that have a lot of ticks  or in areas where the disease is common. TREATMENT It is important to begin treatment right away. Treatment will usually involve the use of antibiotic medicines. In some cases, your health care provider may begin treatment before the diagnosis is confirmed. If your symptoms are severe, a hospital stay may be needed. HOME CARE INSTRUCTIONS  Rest as much as possible until you feel better.  Take medicines only as directed by your health care provider.  Take your antibiotic medicine as directed by your health care provider. Finish the antibiotic even if you start to feel better.  Drink enough fluid to keep your urine clear or pale yellow.  Keep all follow-up visits as directed by your health care provider. This is important. PREVENTION Avoiding tick bites can help to prevent this illness. Take these steps to avoid tick bites when you are outdoors:  Be aware that most ticks live in shrubs, low tree branches, and grassy areas. A tick can climb onto your body when you make contact with leaves or grass where the tick is waiting.  Wear protective clothing. Long sleeves and long pants are best.  Wear white clothes so you can see ticks more easily.  Tuck your pant legs into your socks.  If you go walking on a trail, stay in the middle of the trail to avoid brushing against bushes.  Avoid walking through areas that have long grass.  Put insect repellent on all exposed skin and along boot tops, pant legs, and sleeve cuffs.  Check clothing, hair, and skin repeatedly and before going inside.  Check family members and pets for ticks.  Brush off any ticks that are not attached.  Take a shower or a bath as soon as possible after you have been outdoors. Check your skin for ticks. The most common places on the body where ticks attach themselves are the scalp, neck, armpits, waist, and groin. You can also greatly reduce your chances of getting [facility] spotted fever if you remove attached  ticks as soon as possible. To remove an attached tick, use a forceps or fine-point tweezers to detach the intact tick without leaving its mouth parts in the skin. The wound from the tick bite should be washed after the tick has been removed. SEEK MEDICAL CARE IF:  You have drainage, swelling, or increased redness or pain in the area of the rash. SEEK IMMEDIATE MEDICAL CARE IF:  You have chest pain.  You have shortness of breath.  You have a severe headache.  You have a seizure.  You have severe abdominal pain.  You are feeling confused.  You are bruising easily.  You have bleeding from your gums.  You have blood in your stool.   This information is not intended to replace advice given to you by your health care provider. Make sure you discuss any questions you have with your health care provider.   Document Released: 04/06/2000 Document Revised: 01/13/2014 Document Reviewed: 08/08/2013 Elsevier Interactive Patient Education [employer].

## 2015-06-21 ENCOUNTER — Other Ambulatory Visit: Payer: Self-pay | Admitting: Family

## 2015-06-21 DIAGNOSIS — Z91018 Allergy to other foods: Secondary | ICD-10-CM | POA: Insufficient documentation

## 2015-06-21 LAB — ANEMIA PROFILE B
BASOS ABS: 0 10*3/uL (ref 0.0–0.2)
Basos: 0 %
EOS (ABSOLUTE): 0.2 10*3/uL (ref 0.0–0.4)
Eos: 3 %
Ferritin: 38 ng/mL (ref 30–400)
Folate: 12.7 ng/mL (ref >3.0)
Hematocrit: 38.3 % (ref 37.5–51.0)
Hemoglobin: 12.8 g/dL (ref 12.6–17.7)
IMMATURE GRANS (ABS): 0 10*3/uL (ref 0.0–0.1)
IMMATURE GRANULOCYTES: 0 %
IRON: 87 ug/dL (ref 38–169)
Iron Saturation: 27 % (ref 15–55)
LYMPHS: 27 %
Lymphocytes Absolute: 1.7 10*3/uL (ref 0.7–3.1)
MCH: 30.8 pg (ref 26.6–33.0)
MCHC: 33.4 g/dL (ref 31.5–35.7)
MCV: 92 fL (ref 79–97)
MONOCYTES: 10 %
Monocytes Absolute: 0.6 10*3/uL (ref 0.1–0.9)
NEUTROS PCT: 60 %
Neutrophils Absolute: 3.6 10*3/uL (ref 1.4–7.0)
PLATELETS: 242 10*3/uL (ref 150–379)
RBC: 4.15 x10E6/uL (ref 4.14–5.80)
RDW: 13.7 % (ref 12.3–15.4)
RETIC CT PCT: 1.2 % (ref 0.6–2.6)
TIBC: 324 ug/dL (ref 250–450)
UIBC: 237 ug/dL (ref 111–343)
VITAMIN B 12: 1507 pg/mL — AB (ref 211–946)
WBC: 6.1 10*3/uL (ref 3.4–10.8)

## 2015-06-21 LAB — CMP14+EGFR
ALBUMIN: 4.5 g/dL (ref 3.5–5.5)
ALT: 17 IU/L (ref 0–44)
AST: 18 IU/L (ref 0–40)
Albumin/Globulin Ratio: 1.9 (ref 1.2–2.2)
Alkaline Phosphatase: 95 IU/L (ref 39–117)
BUN / CREAT RATIO: 19 (ref 9–20)
BUN: 20 mg/dL (ref 6–24)
Bilirubin Total: 0.4 mg/dL (ref 0.0–1.2)
CALCIUM: 9.1 mg/dL (ref 8.7–10.2)
CO2: 21 mmol/L (ref 18–29)
Chloride: 102 mmol/L (ref 96–106)
Creatinine, Ser: 1.07 mg/dL (ref 0.76–1.27)
GFR, EST AFRICAN AMERICAN: 89 mL/min/{1.73_m2} (ref >59)
GFR, EST NON AFRICAN AMERICAN: 77 mL/min/{1.73_m2} (ref >59)
GLUCOSE: 84 mg/dL (ref 65–99)
Globulin, Total: 2.4 g/dL (ref 1.5–4.5)
Potassium: 4.2 mmol/L (ref 3.5–5.2)
Sodium: 141 mmol/L (ref 134–144)
TOTAL PROTEIN: 6.9 g/dL (ref 6.0–8.5)

## 2015-06-21 LAB — ALPHA-GAL PANEL
ALPHA GAL IGE: 24.6 kU/L — AB (ref <0.35)
Beef (Bos spp) IgE: 8.88 kU/L — ABNORMAL HIGH (ref <0.35)
Class Interpretation: 3
LAMB CLASS INTERPRETATION: 2
LAMB/MUTTON (OVIS SPP) IGE: 1.29 kU/L — AB (ref <0.35)
PORK CLASS INTERPRETATION: 3
Pork (Sus spp) IgE: 5.2 kU/L — ABNORMAL HIGH (ref <0.35)

## 2015-06-21 LAB — THYROID PANEL WITH TSH
FREE THYROXINE INDEX: 1.3 (ref 1.2–4.9)
T3 Uptake Ratio: 27 % (ref 24–39)
T4 TOTAL: 4.8 ug/dL (ref 4.5–12.0)
TSH: 1.51 u[IU]/mL (ref 0.450–4.500)

## 2015-06-21 LAB — LYME AB/WESTERN BLOT REFLEX
LYME DISEASE AB, QUANT, IGM: <0.8 index (ref 0.00–0.79)
Lyme IgG/IgM Ab: <0.91 {ISR} (ref 0.00–0.90)

## 2015-06-21 LAB — ROCKY MTN SPOTTED FVR ABS PNL(IGG+IGM)
RMSF IGG: NEGATIVE
RMSF IgM: 0.3 index (ref 0.00–0.89)

## 2015-06-21 MED ORDER — EPINEPHRINE 0.3 MG/0.3ML IJ SOAJ: 0.3000 mg | Freq: Once | INTRAMUSCULAR | Status: DC

## 2015-06-22 ENCOUNTER — Telehealth: Payer: Self-pay | Admitting: Family Medicine

## 2015-06-22 NOTE — Telephone Encounter (Signed)
Patient aware that Lyme came back negative.  Patient states that he is still having some joint pain but is getting better. Patient aware to avoid red meat, lamb and pork. Patient will ibuprofen to help with joint pain

## 2015-06-28 ENCOUNTER — Encounter: Payer: Self-pay | Admitting: Family

## 2015-06-29 ENCOUNTER — Other Ambulatory Visit: Payer: Self-pay | Admitting: Family Medicine

## 2015-07-09 DIAGNOSIS — R21 Rash and other nonspecific skin eruption: Secondary | ICD-10-CM | POA: Diagnosis not present

## 2015-07-09 DIAGNOSIS — W458XXA Other foreign body or object entering through skin, initial encounter: Secondary | ICD-10-CM | POA: Diagnosis not present

## 2015-07-17 ENCOUNTER — Ambulatory Visit (INDEPENDENT_AMBULATORY_CARE_PROVIDER_SITE_OTHER): Payer: BLUE CROSS/BLUE SHIELD | Admitting: Nurse Practitioner

## 2015-07-17 ENCOUNTER — Encounter: Payer: Self-pay | Admitting: Nurse Practitioner

## 2015-07-17 VITALS — BP 130/75 | HR 69 | Temp 97.7°F | Ht 69.0 in | Wt 194.0 lb

## 2015-07-17 DIAGNOSIS — A09 Infectious gastroenteritis and colitis, unspecified: Secondary | ICD-10-CM

## 2015-07-17 DIAGNOSIS — R197 Diarrhea, unspecified: Secondary | ICD-10-CM

## 2015-07-17 NOTE — Patient Instructions (Signed)

## 2015-07-17 NOTE — Progress Notes (Signed)
   Subjective:    Patient ID: CARSYN NUFER, male    DOB: [birth year], 55 y.o.   MRN: [id number]  HPI  Patient was seen on 06/18/15 with tick bite and c/o fatigue- he was given doxcycline- llabs tested positive for alpha gal but lyme and RMSF tested negative. He is c/o having diarrhea every morning. Has had since was seen for tick bite. He is also nauseated in mornings and in evenings. He has ot taken anything for this.   Review of Systems  Constitutional: Negative for fever and chills.  HENT: Negative.   Respiratory: Negative.   Cardiovascular: Negative.   Gastrointestinal: Negative for nausea and diarrhea.  Genitourinary: Negative.   Neurological: Negative.   Psychiatric/Behavioral: Negative.   All other systems reviewed and are negative.      Objective:   Physical Exam  Constitutional: He is oriented to person, place, and time. He appears well-developed and well-nourished. No distress.  Cardiovascular: Normal rate, regular rhythm and normal heart sounds.   Pulmonary/Chest: Effort normal and breath sounds normal.  Abdominal: Soft. Bowel sounds are normal. He exhibits no mass. There is no tenderness. There is no rebound and no guarding.  Neurological: He is alert and oriented to person, place, and time.  Skin: Skin is warm.  Psychiatric: He has a normal mood and affect. His behavior is normal. Judgment and thought content normal.    BP 130/75 mmHg  Pulse 69  Temp(Src) 97.7 F (36.5 C) (Oral)  Ht 5\' 9"  (1.753 m)  Wt 194 lb (87.998 kg)  BMI 28.64 kg/m2       Assessment & Plan:   1. Diarrhea of presumed infectious origin    Avoid spicy and fatty foods Orders Placed This Encounter  Procedures  . Cdiff NAA+O+P+Stool Culture   After get stool specimen - start on imodium AD OTC Force fluids Will talk once specimen results are back  [place], FNP

## 2015-07-18 ENCOUNTER — Other Ambulatory Visit: Payer: BLUE CROSS/BLUE SHIELD

## 2015-07-18 DIAGNOSIS — A09 Infectious gastroenteritis and colitis, unspecified: Secondary | ICD-10-CM | POA: Diagnosis not present

## 2015-07-20 ENCOUNTER — Other Ambulatory Visit: Payer: Self-pay | Admitting: Family Medicine

## 2015-07-20 DIAGNOSIS — N401 Enlarged prostate with lower urinary tract symptoms: Secondary | ICD-10-CM | POA: Diagnosis not present

## 2015-07-20 DIAGNOSIS — R338 Other retention of urine: Secondary | ICD-10-CM | POA: Diagnosis not present

## 2015-07-20 DIAGNOSIS — N529 Male erectile dysfunction, unspecified: Secondary | ICD-10-CM | POA: Diagnosis not present

## 2015-07-20 DIAGNOSIS — R972 Elevated prostate specific antigen [PSA]: Secondary | ICD-10-CM | POA: Diagnosis not present

## 2015-07-20 DIAGNOSIS — N200 Calculus of kidney: Secondary | ICD-10-CM | POA: Diagnosis not present

## 2015-07-23 DIAGNOSIS — G729 Myopathy, unspecified: Secondary | ICD-10-CM | POA: Diagnosis not present

## 2015-07-23 DIAGNOSIS — G713 Mitochondrial myopathy, not elsewhere classified: Secondary | ICD-10-CM | POA: Diagnosis not present

## 2015-07-25 LAB — CDIFF NAA+O+P+STOOL CULTURE
E coli, Shiga toxin Assay: NEGATIVE
Toxigenic C. Difficile by PCR: NEGATIVE

## 2015-07-30 ENCOUNTER — Other Ambulatory Visit: Payer: Self-pay | Admitting: Family Medicine

## 2015-07-31 ENCOUNTER — Encounter: Payer: Self-pay | Admitting: Family Medicine

## 2015-07-31 ENCOUNTER — Ambulatory Visit (INDEPENDENT_AMBULATORY_CARE_PROVIDER_SITE_OTHER): Payer: BLUE CROSS/BLUE SHIELD | Admitting: Family Medicine

## 2015-07-31 VITALS — BP 128/76 | HR 77 | Temp 98.8°F | Ht 69.0 in | Wt 192.4 lb

## 2015-07-31 DIAGNOSIS — E119 Type 2 diabetes mellitus without complications: Secondary | ICD-10-CM | POA: Diagnosis not present

## 2015-07-31 DIAGNOSIS — H6122 Impacted cerumen, left ear: Secondary | ICD-10-CM

## 2015-07-31 DIAGNOSIS — I10 Essential (primary) hypertension: Secondary | ICD-10-CM

## 2015-07-31 LAB — BAYER DCA HB A1C WAIVED: HB A1C: 5.2 % (ref <7.0)

## 2015-07-31 MED ORDER — LIRAGLUTIDE 18 MG/3ML ~~LOC~~ SOPN: 1.8000 mg | PEN_INJECTOR | Freq: Every day | SUBCUTANEOUS | 11 refills | Status: DC
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
## 2015-08-04 DIAGNOSIS — M549 Dorsalgia, unspecified: Secondary | ICD-10-CM | POA: Diagnosis not present

## 2015-08-04 DIAGNOSIS — S300XXA Contusion of lower back and pelvis, initial encounter: Secondary | ICD-10-CM | POA: Diagnosis not present

## 2015-08-10 ENCOUNTER — Telehealth: Payer: Self-pay | Admitting: Family Medicine

## 2015-08-10 DIAGNOSIS — M549 Dorsalgia, unspecified: Secondary | ICD-10-CM

## 2015-08-10 DIAGNOSIS — G7100 Muscular dystrophy, unspecified: Secondary | ICD-10-CM

## 2015-08-10 NOTE — Telephone Encounter (Signed)
We can go ahead and try and send one I do not know if insurance companies will cover it.

## 2015-08-13 NOTE — Telephone Encounter (Signed)
Pt aware - will fax pt the RX

## 2015-08-14 ENCOUNTER — Telehealth: Payer: Self-pay | Admitting: Family Medicine

## 2015-08-14 ENCOUNTER — Encounter: Payer: Self-pay | Admitting: Family

## 2015-08-14 ENCOUNTER — Ambulatory Visit (INDEPENDENT_AMBULATORY_CARE_PROVIDER_SITE_OTHER): Payer: BLUE CROSS/BLUE SHIELD | Admitting: Family

## 2015-08-14 VITALS — BP 137/82 | HR 76 | Temp 97.6°F | Ht 69.0 in | Wt 195.2 lb

## 2015-08-14 DIAGNOSIS — S39012A Strain of muscle, fascia and tendon of lower back, initial encounter: Secondary | ICD-10-CM

## 2015-08-14 MED ORDER — CYCLOBENZAPRINE HCL 10 MG PO TABS: 10.0000 mg | ORAL_TABLET | Freq: Three times a day (TID) | ORAL | 0 refills | Status: DC | PRN

## 2015-08-14 MED ORDER — PREDNISONE 10 MG (21) PO TBPK: ORAL_TABLET | ORAL | 0 refills | Status: DC

## 2015-08-14 MED ORDER — NAPROXEN 500 MG PO TABS: 500.0000 mg | ORAL_TABLET | Freq: Two times a day (BID) | ORAL | 0 refills | Status: DC

## 2015-08-14 NOTE — Patient Instructions (Addendum)

## 2015-08-14 NOTE — Telephone Encounter (Signed)
Order faxed to pt.

## 2015-08-14 NOTE — Progress Notes (Signed)
   Subjective:    Patient ID: Johnny Mathews, male    DOB: Jul 14, 1960, 55 y.o.   MRN: [id number]  Pt presents to the office today for lower back pain. PT states he fell two weeks ago and landed on his back. Pt went to the Urgent Care and had negative x-rays. Pt states he landed on concrete. PT states he is having constant pain of 8 out 10 and turning and moving makes the pain worse. Pt states the pain is gradually improving. Pt has taken tylenol, motrin, flexeril, and rest with mild relief.  Fall  The accident occurred more than 1 week ago. He landed on concrete. Point of impact: back. The pain is at a severity of 8/10. The pain is moderate.      Review of Systems  Constitutional: Negative.   HENT: Negative.   Respiratory: Negative.   Cardiovascular: Negative.   Gastrointestinal: Negative.   Endocrine: Negative.   Genitourinary: Negative.   Musculoskeletal: Positive for gait problem.  Hematological: Negative.   Psychiatric/Behavioral: Negative.   All other systems reviewed and are negative.      Objective:   Physical Exam  Constitutional: He is oriented to person, place, and time. He appears well-developed and well-nourished. No distress.  HENT:  Head: Normocephalic.  Cardiovascular: Normal rate, regular rhythm, normal heart sounds and intact distal pulses.   No murmur heard. Pulmonary/Chest: Effort normal and breath sounds normal. No respiratory distress. He has no wheezes.  Abdominal: Soft. Bowel sounds are normal. He exhibits no distension. There is no tenderness.  Musculoskeletal: Normal range of motion. He exhibits tenderness (right lower back tenderness with palpation). He exhibits no edema.  Neurological: He is alert and oriented to person, place, and time.  Skin: Skin is warm and dry. No rash noted. No erythema.  Psychiatric: He has a normal mood and affect. His behavior is normal. Judgment and thought content normal.  Vitals reviewed.   BP 137/82   Pulse 76   Temp  97.6 F (36.4 C) (Oral)   Ht 5\' 9"  (1.753 m)   Wt 195 lb 3.2 oz (88.5 kg)   BMI 28.83 kg/m        Assessment & Plan:  1. Low back strain, initial encounter -Rest -Ice and heat -ROM exercises encourage -RTO prn - cyclobenzaprine (FLEXERIL) 10 MG tablet; Take 1 tablet (10 mg total) by mouth 3 (three) times daily as needed for muscle spasms.  Dispense: 90 tablet; Refill: 0 - naproxen (NAPROSYN) 500 MG tablet; Take 1 tablet (500 mg total) by mouth 2 (two) times daily with a meal.  Dispense: 60 tablet; Refill: 0 - predniSONE (STERAPRED UNI-PAK 21 TAB) 10 MG (21) TBPK tablet; Use as directed  Dispense: 21 tablet; Refill: 0  Evelina Dun, FNP

## 2015-08-20 ENCOUNTER — Ambulatory Visit (INDEPENDENT_AMBULATORY_CARE_PROVIDER_SITE_OTHER): Payer: BLUE CROSS/BLUE SHIELD

## 2015-08-20 ENCOUNTER — Encounter: Payer: Self-pay | Admitting: Family Medicine

## 2015-08-20 ENCOUNTER — Ambulatory Visit (INDEPENDENT_AMBULATORY_CARE_PROVIDER_SITE_OTHER): Payer: BLUE CROSS/BLUE SHIELD | Admitting: Family Medicine

## 2015-08-20 VITALS — BP 139/85 | HR 63 | Temp 97.5°F | Ht 69.0 in | Wt 195.0 lb

## 2015-08-20 DIAGNOSIS — R0602 Shortness of breath: Secondary | ICD-10-CM | POA: Diagnosis not present

## 2015-08-20 MED ORDER — DOXYCYCLINE HYCLATE 100 MG PO TABS: 100.0000 mg | ORAL_TABLET | Freq: Two times a day (BID) | ORAL | 0 refills | Status: DC

## 2015-08-20 MED ORDER — PREDNISONE 20 MG PO TABS: ORAL_TABLET | ORAL | 0 refills | Status: DC

## 2015-08-20 NOTE — Progress Notes (Signed)
BP 139/85 (BP Location: Right Arm, Patient Position: Sitting, Cuff Size: Large)   Pulse 63   Temp 97.5 F (36.4 C) (Oral)   Ht 5\' 9"  (1.753 m)   Wt 195 lb (88.5 kg)   SpO2 99%   BMI 28.80 kg/m    Subjective:    Patient ID: Johnny Mathews, male    DOB: [birth year], 55 y.o.   MRN: [id number]  HPI: Johnny Mathews is a 55 y.o. male presenting on 08/20/2015 for Shortness of Breath (using Albuterol but it is only working for a short while; symptoms began yesterday) and Nasal Congestion   HPI Shortness of breath and wheezing Patient has been having shortness of breath and wheezing that has been increased over the past week. He has been trying to use his albuterol inhaler and he is continued on his Singulair and his Ruthe Mannan and is also been using Flonase. He feels like he is having having chest tightness and pressure in his chest. His albuterol inhaler helps but is not lasting long enough. He was just on a course of steroids for his back that he finished 2 days ago. That also helped his breathing but has worsened as he is tapered down. He denies any sick contacts that he knows of. He denies any fevers or chills. He has had a cough and nasal congestion, the cough is been nonproductive.  Relevant past medical, surgical, family and social history reviewed and updated as indicated. Interim medical history since our last visit reviewed. Allergies and medications reviewed and updated.  Review of Systems  Constitutional: Negative for chills and fever.  HENT: Positive for congestion, postnasal drip, rhinorrhea, sinus pressure, sneezing and sore throat. Negative for ear discharge, ear pain and voice change.   Eyes: Negative for pain, discharge, redness and visual disturbance.  Respiratory: Positive for cough, chest tightness, shortness of breath and wheezing.   Cardiovascular: Negative for chest pain and leg swelling.  Gastrointestinal: Negative for abdominal pain, constipation and diarrhea.    Genitourinary: Negative for difficulty urinating.  Musculoskeletal: Negative for back pain and gait problem.  Skin: Negative for rash.  Neurological: Negative for syncope, light-headedness and headaches.  All other systems reviewed and are negative.   Per HPI unless specifically indicated above     Medication List       Accurate as of 08/20/15  5:01 PM. Always use your most recent med list.          albuterol 108 (90 Base) MCG/ACT inhaler Commonly known as:  PROAIR HFA Inhale 2 puffs into the lungs every 4 (four) hours as needed for wheezing or shortness of breath.   amitriptyline 25 MG tablet Commonly known as:  ELAVIL TAKE THREE TABLETS AT BEDTIME   azelastine 0.1 % nasal spray Commonly known as:  ASTELIN USE 2 SPRAYS IN EACH NOSTRIL AT BEDTIME   cyclobenzaprine 10 MG tablet Commonly known as:  FLEXERIL Take 1 tablet (10 mg total) by mouth 3 (three) times daily as needed for muscle spasms.   DULERA 100-5 MCG/ACT Aero Generic drug:  mometasone-formoterol USE 2 PUFFS TWICE DAILY   EPINEPHrine 0.3 mg/0.3 mL Devi Commonly known as:  EPI-PEN Inject 0.3 mg into the skin as needed.   esomeprazole 40 MG capsule Commonly known as:  NEXIUM TAKE ONE (1) CAPSULE EACH DAY   finasteride 5 MG tablet Commonly known as:  PROSCAR Take 5 mg by mouth daily.   fluticasone 50 MCG/ACT nasal spray Commonly known as:  FLONASE USE 1  SPRAY IN EACH NOSTRIL ONCE DAILY   furosemide 20 MG tablet Commonly known as:  LASIX TAKE ONE (1) TABLET EACH DAY   Liraglutide 18 MG/3ML Sopn Commonly known as:  VICTOZA Inject 0.3 mLs (1.8 mg total) into the skin daily.   losartan 50 MG tablet Commonly known as:  COZAAR TAKE ONE (1) TABLET EACH DAY   pravastatin 40 MG tablet Commonly known as:  PRAVACHOL Take 1 tablet (40 mg total) by mouth daily.   ranitidine 150 MG tablet Commonly known as:  ZANTAC TAKE ONE TABLET BY MOUTH TWICE DAILY   SINGULAIR 10 MG tablet Generic drug:   montelukast Take 10 mg by mouth at bedtime.   tamsulosin 0.4 MG Caps capsule Commonly known as:  FLOMAX Take 0.4 mg by mouth daily.   TRAVEL SICKNESS 25 MG Chew Generic drug:  Meclizine HCl TAKE ONE TABLET DAILY AT BEDTIME          Objective:    BP 139/85 (BP Location: Right Arm, Patient Position: Sitting, Cuff Size: Large)   Pulse 63   Temp 97.5 F (36.4 C) (Oral)   Ht 5\' 9"  (1.753 m)   Wt 195 lb (88.5 kg)   SpO2 99%   BMI 28.80 kg/m   Wt Readings from Last 3 Encounters:  08/20/15 195 lb (88.5 kg)  08/14/15 195 lb 3.2 oz (88.5 kg)  07/31/15 192 lb 6.4 oz (87.3 kg)    Physical Exam  Constitutional: He is oriented to person, place, and time. He appears well-developed and well-nourished. No distress.  HENT:  Right Ear: External ear normal.  Left Ear: External ear normal.  Nose: Nose normal.  Mouth/Throat: Oropharynx is clear and moist. No oropharyngeal exudate.  Eyes: Conjunctivae and EOM are normal. Pupils are equal, round, and reactive to light. Right eye exhibits no discharge. No scleral icterus.  Neck: Neck supple. No thyromegaly present.  Cardiovascular: Normal rate, regular rhythm, normal heart sounds and intact distal pulses.   No murmur heard. Pulmonary/Chest: Effort normal. No respiratory distress. He has no wheezes. He has rales (Left basilar crackles).  Musculoskeletal: Normal range of motion. He exhibits no edema.  Lymphadenopathy:    He has no cervical adenopathy.  Neurological: He is alert and oriented to person, place, and time. Coordination normal.  Skin: Skin is warm and dry. No rash noted. He is not diaphoretic.  Psychiatric: He has a normal mood and affect. His behavior is normal.  Nursing note and vitals reviewed.  Chest x-ray: Perihilar congestion but no signs of overt consolidation, with final report from radiologist    Assessment & Plan:   Problem List Items Addressed This Visit    None    Visit Diagnoses    Shortness of breath    -   Primary   Relevant Orders   DG Chest 2 View       Follow up plan: Return if symptoms worsen or fail to improve.  Counseling provided for all of the vaccine components Orders Placed This Encounter  Procedures  . DG Chest 2 View    Caryl Pina, MD [facility] 08/20/2015, 5:01 PM

## 2015-08-27 ENCOUNTER — Other Ambulatory Visit: Payer: Self-pay | Admitting: Family Medicine

## 2015-08-27 ENCOUNTER — Other Ambulatory Visit: Payer: Self-pay | Admitting: Family

## 2015-09-20 DIAGNOSIS — H40033 Anatomical narrow angle, bilateral: Secondary | ICD-10-CM | POA: Diagnosis not present

## 2015-09-20 DIAGNOSIS — E119 Type 2 diabetes mellitus without complications: Secondary | ICD-10-CM | POA: Diagnosis not present

## 2015-09-20 DIAGNOSIS — H5213 Myopia, bilateral: Secondary | ICD-10-CM | POA: Diagnosis not present

## 2015-09-20 LAB — HM DIABETES EYE EXAM

## 2015-09-25 ENCOUNTER — Other Ambulatory Visit: Payer: Self-pay | Admitting: Family Medicine

## 2015-09-29 ENCOUNTER — Other Ambulatory Visit: Payer: Self-pay | Admitting: Family Medicine

## 2015-10-01 ENCOUNTER — Ambulatory Visit (INDEPENDENT_AMBULATORY_CARE_PROVIDER_SITE_OTHER): Payer: BLUE CROSS/BLUE SHIELD | Admitting: Nurse Practitioner

## 2015-10-01 ENCOUNTER — Encounter: Payer: Self-pay | Admitting: Nurse Practitioner

## 2015-10-01 VITALS — BP 128/77 | HR 86 | Temp 97.3°F | Ht 69.0 in | Wt 197.8 lb

## 2015-10-01 DIAGNOSIS — M79641 Pain in right hand: Secondary | ICD-10-CM | POA: Diagnosis not present

## 2015-10-01 DIAGNOSIS — M79642 Pain in left hand: Secondary | ICD-10-CM

## 2015-10-01 MED ORDER — NAPROXEN 500 MG PO TABS: 500.0000 mg | ORAL_TABLET | Freq: Two times a day (BID) | ORAL | 1 refills | Status: DC

## 2015-10-01 NOTE — Progress Notes (Signed)
   Subjective:    Patient ID: Johnny Mathews, male    DOB: [birth year], 55 y.o.   MRN: [id number]  HPI Patient comes in today c/o bil hand pain- started about 2 weeks ago- has not gotten any better. All  Of joints in fingers hurt- leftr hand worse then right. Has tried tylenol no relief.    Review of Systems  Constitutional: Negative.   Respiratory: Negative.   Cardiovascular: Negative.   Genitourinary: Negative.   Musculoskeletal: Positive for joint swelling.  Neurological: Negative.   Psychiatric/Behavioral: Negative.   All other systems reviewed and are negative.      Objective:   Physical Exam  Constitutional: He appears well-developed and well-nourished.  Cardiovascular: Normal rate, regular rhythm and normal heart sounds.   Pulmonary/Chest: Effort normal and breath sounds normal.  Musculoskeletal:  Decrease ROM of all fingers with inabilty to straighten all the way. All proximal PIP joints are edematous. Grips equal bil  Skin: Skin is warm.  Psychiatric: He has a normal mood and affect. His behavior is normal. Judgment and thought content normal.    BP 128/77 (BP Location: Left Arm, Patient Position: Sitting, Cuff Size: Normal)   Pulse 86   Temp 97.3 F (36.3 C) (Oral)   Ht 5\' 9"  (1.753 m)   Wt 197 lb 12.8 oz (89.7 kg)   BMI 29.21 kg/m        Assessment & Plan:   1. Bilateral hand pain    Meds ordered this encounter  Medications  . naproxen (NAPROSYN) 500 MG tablet    Sig: Take 1 tablet (500 mg total) by mouth 2 (two) times daily with a meal.    Dispense:  60 tablet    Refill:  1    Order Specific Question:   Supervising Provider    Answer:   Evette Doffing, CAROL L [[id number]]   Orders Placed This Encounter  Procedures  . Arthritis Panel   Moist heat  Keep warm Will call with lab results RTO prn  [place], FNP

## 2015-10-02 ENCOUNTER — Other Ambulatory Visit: Payer: BLUE CROSS/BLUE SHIELD

## 2015-10-02 DIAGNOSIS — M79641 Pain in right hand: Secondary | ICD-10-CM | POA: Diagnosis not present

## 2015-10-02 DIAGNOSIS — M79642 Pain in left hand: Secondary | ICD-10-CM | POA: Diagnosis not present

## 2015-10-03 LAB — ARTHRITIS PANEL
BASOS ABS: 0 10*3/uL (ref 0.0–0.2)
Basos: 0 %
EOS (ABSOLUTE): 0.3 10*3/uL (ref 0.0–0.4)
Eos: 5 %
HEMATOCRIT: 36.4 % — AB (ref 37.5–51.0)
Hemoglobin: 12.3 g/dL — ABNORMAL LOW (ref 12.6–17.7)
Immature Grans (Abs): 0 10*3/uL (ref 0.0–0.1)
Immature Granulocytes: 1 %
LYMPHS ABS: 1.6 10*3/uL (ref 0.7–3.1)
Lymphs: 29 %
MCH: 31 pg (ref 26.6–33.0)
MCHC: 33.8 g/dL (ref 31.5–35.7)
MCV: 92 fL (ref 79–97)
MONOS ABS: 0.6 10*3/uL (ref 0.1–0.9)
Monocytes: 11 %
NEUTROS ABS: 3 10*3/uL (ref 1.4–7.0)
Neutrophils: 54 %
PLATELETS: 249 10*3/uL (ref 150–379)
RBC: 3.97 x10E6/uL — AB (ref 4.14–5.80)
RDW: 13.6 % (ref 12.3–15.4)
Rhuematoid fact SerPl-aCnc: <10 IU/mL (ref 0.0–13.9)
SED RATE: 6 mm/h (ref 0–30)
Uric Acid: 5.9 mg/dL (ref 3.7–8.6)
WBC: 5.5 10*3/uL (ref 3.4–10.8)

## 2015-10-04 DIAGNOSIS — R0609 Other forms of dyspnea: Secondary | ICD-10-CM | POA: Diagnosis not present

## 2015-10-04 DIAGNOSIS — Z87891 Personal history of nicotine dependence: Secondary | ICD-10-CM | POA: Diagnosis not present

## 2015-10-16 ENCOUNTER — Encounter: Payer: Self-pay | Admitting: Nurse Practitioner

## 2015-10-17 ENCOUNTER — Encounter: Payer: Self-pay | Admitting: Family Medicine

## 2015-10-27 ENCOUNTER — Other Ambulatory Visit: Payer: Self-pay | Admitting: Family Medicine

## 2015-11-02 ENCOUNTER — Ambulatory Visit (INDEPENDENT_AMBULATORY_CARE_PROVIDER_SITE_OTHER): Payer: BLUE CROSS/BLUE SHIELD

## 2015-11-02 ENCOUNTER — Encounter: Payer: Self-pay | Admitting: Family Medicine

## 2015-11-02 ENCOUNTER — Ambulatory Visit (INDEPENDENT_AMBULATORY_CARE_PROVIDER_SITE_OTHER): Payer: BLUE CROSS/BLUE SHIELD | Admitting: Family Medicine

## 2015-11-02 ENCOUNTER — Other Ambulatory Visit: Payer: Self-pay | Admitting: Nurse Practitioner

## 2015-11-02 VITALS — BP 148/80 | HR 72 | Temp 97.3°F | Ht 69.0 in | Wt 196.4 lb

## 2015-11-02 DIAGNOSIS — M533 Sacrococcygeal disorders, not elsewhere classified: Secondary | ICD-10-CM

## 2015-11-02 DIAGNOSIS — M79642 Pain in left hand: Principal | ICD-10-CM

## 2015-11-02 DIAGNOSIS — M79641 Pain in right hand: Secondary | ICD-10-CM

## 2015-11-02 MED ORDER — TRAMADOL HCL 50 MG PO TABS: 50.0000 mg | ORAL_TABLET | Freq: Three times a day (TID) | ORAL | 0 refills | Status: DC | PRN

## 2015-11-02 MED ORDER — CYCLOBENZAPRINE HCL 10 MG PO TABS: 10.0000 mg | ORAL_TABLET | Freq: Three times a day (TID) | ORAL | 0 refills | Status: DC | PRN
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
## 2015-11-02 NOTE — Patient Instructions (Signed)
Great to see you!  We will call with official x ray results  Try tramadol for pain, use it sparingly and do not drive after you use it.   Try flexeril as well.

## 2015-12-07 ENCOUNTER — Other Ambulatory Visit: Payer: Self-pay | Admitting: Family Medicine

## 2015-12-11 ENCOUNTER — Encounter: Payer: Self-pay | Admitting: Family Medicine

## 2015-12-11 ENCOUNTER — Ambulatory Visit (INDEPENDENT_AMBULATORY_CARE_PROVIDER_SITE_OTHER): Payer: BLUE CROSS/BLUE SHIELD

## 2015-12-11 ENCOUNTER — Ambulatory Visit (INDEPENDENT_AMBULATORY_CARE_PROVIDER_SITE_OTHER): Payer: BLUE CROSS/BLUE SHIELD | Admitting: Family Medicine

## 2015-12-11 VITALS — BP 129/85 | HR 75 | Temp 97.8°F | Ht 69.0 in | Wt 197.0 lb

## 2015-12-11 DIAGNOSIS — Z0000 Encounter for general adult medical examination without abnormal findings: Secondary | ICD-10-CM | POA: Diagnosis not present

## 2015-12-11 DIAGNOSIS — M79641 Pain in right hand: Secondary | ICD-10-CM

## 2015-12-11 DIAGNOSIS — E119 Type 2 diabetes mellitus without complications: Secondary | ICD-10-CM

## 2015-12-11 LAB — BAYER DCA HB A1C WAIVED: HB A1C: 5.2 % (ref <7.0)

## 2015-12-11 NOTE — Progress Notes (Signed)
   HPI  Patient presents today here for an annual physical exam.  Patient explains that he feels well overall, he complains of right hand pain Describes bilateral but right greater than left hand pain and diffuse joints that worsen areas he states he has some swelling intermittently. He tried naproxen 500 mg but had too much dyspepsia to tolerate.  Type 2 diabetes Good medication compliance Depression is extremely active walking over 20,000 steps daily and competing with other walkers who are measuring.  He has had recent labs at work, he will bring in results when they are available  PMH: Smoking status noted ROS: Per HPI  Objective: BP 129/85   Pulse 75   Temp 97.8 F (36.6 C) (Oral)   Ht 5\' 9"  (1.753 m)   Wt 197 lb (89.4 kg)   BMI 29.09 kg/m  Gen: NAD, alert, cooperative with exam HEENT: NCAT, EOMI, PERRL, frequent blinking CV: RRR, good S1/S2, no murmur Resp: CTABL, no wheezes, non-labored Abd: SNTND, BS present, no guarding or organomegaly Ext: No edema, warm Neuro: Alert and oriented, strength 5/5 on BL L  Assessment and plan:  # Annual physical exam Normal exam PT  Is struggling with weight but working hard to maintain.  Encouraged to continue healthy lifestyle  # T2DM A1C today, expect good control Continue victoza  # R hand pain Suspect OA, XR yoday pending Discussed lower dose NSAIDs    Orders Placed This Encounter  Procedures  . DG Hand Complete Right    Standing Status:   Future    Number of Occurrences:   1    Standing Expiration Date:   02/10/2017    Order Specific Question:   Reason for Exam (SYMPTOM  OR DIAGNOSIS REQUIRED)    Answer:   R hand pain, eval for OA    Order Specific Question:   Preferred imaging location?    Answer:   Internal  . Bayer DCA Hb A1c Waived    No orders of the defined types were placed in this encounter.   Laroy Apple, MD [facility] 12/11/2015, 11:39 AM

## 2015-12-11 NOTE — Patient Instructions (Signed)
Great to see you!  We will call with lab review after they are here, ask for results  Try aleve for hand pain

## 2015-12-12 ENCOUNTER — Encounter: Payer: Self-pay | Admitting: Family Medicine

## 2015-12-14 ENCOUNTER — Encounter: Payer: Self-pay | Admitting: Family Medicine

## 2015-12-14 DIAGNOSIS — E785 Hyperlipidemia, unspecified: Secondary | ICD-10-CM

## 2015-12-20 MED ORDER — PRAVASTATIN SODIUM 80 MG PO TABS: 80.0000 mg | ORAL_TABLET | Freq: Every day | ORAL | 3 refills | Status: DC

## 2015-12-20 NOTE — Addendum Note (Signed)
Addended by: Timmothy Euler on: 12/20/2015 12:23 PM   Modules accepted: Orders

## 2016-01-01 ENCOUNTER — Encounter: Payer: Self-pay | Admitting: Family Medicine

## 2016-01-02 MED ORDER — MELOXICAM 15 MG PO TABS: 15.0000 mg | ORAL_TABLET | Freq: Every day | ORAL | 0 refills | Status: DC

## 2016-01-15 DIAGNOSIS — M79604 Pain in right leg: Secondary | ICD-10-CM | POA: Diagnosis not present

## 2016-01-15 DIAGNOSIS — M545 Low back pain: Secondary | ICD-10-CM | POA: Diagnosis not present

## 2016-01-15 DIAGNOSIS — G729 Myopathy, unspecified: Secondary | ICD-10-CM | POA: Diagnosis not present

## 2016-01-15 DIAGNOSIS — M79605 Pain in left leg: Secondary | ICD-10-CM | POA: Diagnosis not present

## 2016-01-18 ENCOUNTER — Other Ambulatory Visit: Payer: Self-pay | Admitting: Family Medicine

## 2016-01-19 ENCOUNTER — Encounter: Payer: Self-pay | Admitting: Nurse Practitioner

## 2016-01-19 ENCOUNTER — Ambulatory Visit (INDEPENDENT_AMBULATORY_CARE_PROVIDER_SITE_OTHER): Payer: BLUE CROSS/BLUE SHIELD | Admitting: Nurse Practitioner

## 2016-01-19 VITALS — BP 136/82 | HR 77 | Temp 101.1°F | Ht 69.0 in | Wt 202.0 lb

## 2016-01-19 DIAGNOSIS — J101 Influenza due to other identified influenza virus with other respiratory manifestations: Secondary | ICD-10-CM

## 2016-01-19 DIAGNOSIS — R6889 Other general symptoms and signs: Secondary | ICD-10-CM | POA: Insufficient documentation

## 2016-01-19 DIAGNOSIS — J209 Acute bronchitis, unspecified: Secondary | ICD-10-CM | POA: Diagnosis not present

## 2016-01-19 MED ORDER — AZITHROMYCIN 250 MG PO TABS: ORAL_TABLET | ORAL | 0 refills | Status: DC

## 2016-01-19 MED ORDER — OSELTAMIVIR PHOSPHATE 75 MG PO CAPS: 75.0000 mg | ORAL_CAPSULE | Freq: Two times a day (BID) | ORAL | 0 refills | Status: DC

## 2016-01-19 MED ORDER — HYDROCODONE-HOMATROPINE 5-1.5 MG/5ML PO SYRP: 5.0000 mL | ORAL_SOLUTION | Freq: Four times a day (QID) | ORAL | 0 refills | Status: DC | PRN

## 2016-01-19 NOTE — Progress Notes (Signed)
Subjective:    Patient ID: Johnny Mathews, male    DOB: [birth year], 58 y.o.   MRN: [id number]  HPI Patient comes in today c/o cough , congestion and body aches. Started yesterday morning. Had slight cough 2 days ago. Has had fever but has nit checked it.    Review of Systems  Constitutional: Positive for chills and fever.  HENT: Positive for congestion, sore throat and voice change. Negative for trouble swallowing.   Respiratory: Positive for cough and shortness of breath.   Cardiovascular: Negative.   Gastrointestinal: Negative.   Genitourinary: Negative.   Neurological: Positive for headaches.  Psychiatric/Behavioral: Negative.        Objective:   Physical Exam  Constitutional: He is oriented to person, place, and time. He appears well-developed and well-nourished. No distress (mild).  HENT:  Right Ear: Hearing, tympanic membrane, external ear and ear canal normal.  Left Ear: Hearing, tympanic membrane, external ear and ear canal normal.  Nose: Mucosal edema and rhinorrhea present. Right sinus exhibits no maxillary sinus tenderness and no frontal sinus tenderness. Left sinus exhibits no maxillary sinus tenderness and no frontal sinus tenderness.  Mouth/Throat: Uvula is midline, oropharynx is clear and moist and mucous membranes are normal.  Cardiovascular: Normal heart sounds.   Slight tachycardia  Pulmonary/Chest: Effort normal. No respiratory distress.  Course ronchi throughout lung fielda  Abdominal: Soft. Bowel sounds are normal.  Neurological: He is alert and oriented to person, place, and time.  Skin: Skin is warm.  Face flushed and skin hot to touch  Psychiatric: He has a normal mood and affect. His behavior is normal. Judgment and thought content normal.   BP 136/82 (BP Location: Left Arm, Patient Position: Sitting, Cuff Size: Large)   Pulse 77   Temp (!) 101.1 F (38.4 C) (Oral)   Ht 5\' 9"  (1.753 m)   Wt 202 lb (91.6 kg)   BMI 29.83 kg/m   Flu A positive  and B negative      Assessment & Plan:   1. Flu-like symptoms   2. Acute bronchitis, unspecified organism   3. Influenza A    Meds ordered this encounter  Medications  . azithromycin (ZITHROMAX Z-PAK) 250 MG tablet    Sig: As directed    Dispense:  6 tablet    Refill:  0    Order Specific Question:   Supervising Provider    Answer:   VINCENT, CAROL L [[id number]]  . oseltamivir (TAMIFLU) 75 MG capsule    Sig: Take 1 capsule (75 mg total) by mouth 2 (two) times daily.    Dispense:  10 capsule    Refill:  0    Order Specific Question:   Supervising Provider    Answer:   VINCENT, CAROL L [[id number]]  . HYDROcodone-homatropine (HYCODAN) 5-1.5 MG/5ML syrup    Sig: Take 5 mLs by mouth every 6 (six) hours as needed for cough.    Dispense:  120 mL    Refill:  0    Order Specific Question:   Supervising Provider    Answer:   VINCENT, CAROL L [[id number]]   1. Take meds as prescribed 2. Use a cool mist humidifier especially during the winter months and when heat has been humid. 3. Use saline nose sprays frequently 4. Saline irrigations of the nose can be very helpful if done frequently.  * 4X daily for 1 week*  * Use of a nettie pot can be helpful with this. Follow directions with this* 5.  Drink plenty of fluids 6. Keep thermostat turn down low 7.For any cough or congestion  Use plain Mucinex- regular strength or max strength is fine   * Children- consult with Pharmacist for dosing 8. For fever or aces or pains- take tylenol or ibuprofen appropriate for age and weight.  * for fevers greater than 101 orally you may alternate ibuprofen and tylenol every  3 hours.   Mary-Margaret Hassell Done, FNP

## 2016-01-19 NOTE — Patient Instructions (Signed)

## 2016-01-21 LAB — VERITOR FLU A/B WAIVED
INFLUENZA A: POSITIVE — AB
Influenza B: NEGATIVE

## 2016-01-24 ENCOUNTER — Encounter: Payer: Self-pay | Admitting: Nurse Practitioner

## 2016-01-25 ENCOUNTER — Other Ambulatory Visit: Payer: Self-pay | Admitting: Family Medicine

## 2016-02-06 DIAGNOSIS — G713 Mitochondrial myopathy, not elsewhere classified: Secondary | ICD-10-CM | POA: Diagnosis not present

## 2016-02-08 ENCOUNTER — Ambulatory Visit (INDEPENDENT_AMBULATORY_CARE_PROVIDER_SITE_OTHER): Payer: BLUE CROSS/BLUE SHIELD | Admitting: Family Medicine

## 2016-02-08 ENCOUNTER — Encounter: Payer: Self-pay | Admitting: Family Medicine

## 2016-02-08 ENCOUNTER — Ambulatory Visit (INDEPENDENT_AMBULATORY_CARE_PROVIDER_SITE_OTHER): Payer: BLUE CROSS/BLUE SHIELD

## 2016-02-08 VITALS — BP 138/84 | HR 82 | Temp 98.9°F | Ht 69.0 in | Wt 203.0 lb

## 2016-02-08 DIAGNOSIS — R05 Cough: Secondary | ICD-10-CM

## 2016-02-08 DIAGNOSIS — J209 Acute bronchitis, unspecified: Secondary | ICD-10-CM

## 2016-02-08 DIAGNOSIS — R059 Cough, unspecified: Secondary | ICD-10-CM

## 2016-02-08 MED ORDER — HYDROCODONE-HOMATROPINE 5-1.5 MG/5ML PO SYRP: 5.0000 mL | ORAL_SOLUTION | Freq: Four times a day (QID) | ORAL | 0 refills | Status: DC | PRN

## 2016-02-08 MED ORDER — METHYLPREDNISOLONE ACETATE 80 MG/ML IJ SUSP
80.0000 mg | Freq: Once | INTRAMUSCULAR | Status: AC
  Administered 2016-02-08: 80 mg via INTRAMUSCULAR

## 2016-02-08 NOTE — Progress Notes (Signed)
Subjective:    Patient ID: Johnny Mathews, male    DOB: 17-Feb-1960, 56 y.o.   MRN: [id number]  HPI 56 year old male who had the flu and was treated with Tamiflu but just has not been able to shake the cough. It is nonproductive. He has had some shortness of breath as well. He does have a history of asthma and has both maintenance medicine and short acting albuterol. He has not had any fevers.  Patient Active Problem List   Diagnosis Date Noted  . Influenza A 01/19/2016  . Flu-like symptoms 01/19/2016  . Acute bronchitis 01/19/2016  . Allergy to meat 06/21/2015  . Essential hypertension 02/05/2015  . Sciatica of left side 10/02/2014  . Diabetes mellitus without complication ([place])   . Right knee meniscal tear 10/26/2013  . GERD (gastroesophageal reflux disease) 12/23/2012  . Hyperlipidemia with target LDL less than 100 12/23/2012  . T2DM (type 2 diabetes mellitus) ([place]) 12/23/2012  . Fluid retention 12/23/2012  . Asthma, mild intermittent 12/23/2012  . Muscular dystrophy ([place]) 04/29/2012  . Right shoulder pain 03/02/2012   Outpatient Encounter Prescriptions as of 02/08/2016  Medication Sig  . albuterol (PROAIR HFA) 108 (90 BASE) MCG/ACT inhaler Inhale 2 puffs into the lungs every 4 (four) hours as needed for wheezing or shortness of breath.  Marland Kitchen amitriptyline (ELAVIL) 25 MG tablet TAKE 3 TABLETS BY MOUTH AT BEDTIME  . azelastine (ASTELIN) 0.1 % nasal spray USE 2 SPRAYS IN EACH NOSTRIL AT BEDTIME  . DULERA 100-5 MCG/ACT AERO USE 2 PUFFS TWICE DAILY  . EPINEPHrine (EPI-PEN) 0.3 mg/0.3 mL DEVI Inject 0.3 mg into the skin as needed.   Marland Kitchen esomeprazole (NEXIUM) 40 MG capsule TAKE ONE (1) CAPSULE EACH DAY  . finasteride (PROSCAR) 5 MG tablet Take 5 mg by mouth daily.  . fluticasone (FLONASE) 50 MCG/ACT nasal spray USE 1 SPRAY IN EACH NOSTRIL ONCE DAILY  . furosemide (LASIX) 20 MG tablet TAKE ONE (1) TABLET EACH DAY  . Liraglutide (VICTOZA) 18 MG/3ML SOPN Inject 0.3 mLs (1.8 mg total) into the  skin daily.  Marland Kitchen losartan (COZAAR) 50 MG tablet TAKE ONE (1) TABLET EACH DAY  . meloxicam (MOBIC) 15 MG tablet Take 1 tablet (15 mg total) by mouth daily.  . montelukast (SINGULAIR) 10 MG tablet Take 10 mg by mouth at bedtime.  . montelukast (SINGULAIR) 10 MG tablet TAKE ONE (1) TABLET EACH DAY  . ranitidine (ZANTAC) 150 MG tablet TAKE ONE TABLET BY MOUTH TWICE DAILY  . TRAVEL SICKNESS 25 MG CHEW TAKE ONE TABLET DAILY AT BEDTIME  . [DISCONTINUED] azithromycin (ZITHROMAX Z-PAK) 250 MG tablet As directed  . [DISCONTINUED] HYDROcodone-homatropine (HYCODAN) 5-1.5 MG/5ML syrup Take 5 mLs by mouth every 6 (six) hours as needed for cough.  . [DISCONTINUED] oseltamivir (TAMIFLU) 75 MG capsule Take 1 capsule (75 mg total) by mouth 2 (two) times daily.  . [DISCONTINUED] pravastatin (PRAVACHOL) 80 MG tablet Take 1 tablet (80 mg total) by mouth daily. (Patient not taking: Reported on 01/19/2016)  . [DISCONTINUED] tamsulosin (FLOMAX) 0.4 MG CAPS capsule Take 0.4 mg by mouth daily.  . [DISCONTINUED] traMADol (ULTRAM) 50 MG tablet Take 1 tablet (50 mg total) by mouth every 8 (eight) hours as needed.   No facility-administered encounter medications on file as of 02/08/2016.       Review of Systems  Constitutional: Negative.   HENT: Negative.   Respiratory: Positive for cough.   Neurological: Negative.   Psychiatric/Behavioral: Negative.        Objective:  Physical Exam  Constitutional: He appears well-developed and well-nourished.  Pulmonary/Chest: Effort normal and breath sounds normal.  Pulse oximetry showed a resting pulse of 9798%. After brisk walk around the interior of the building did not change significantly, by 1 or 2%. Chest x-ray was done and showed no infiltrate.   BP 138/84   Pulse 82   Temp 98.9 F (37.2 C) (Oral)   Ht 5\' 9"  (1.753 m)   Wt 203 lb (92.1 kg)   BMI 29.98 kg/m         Assessment & Plan:  1. Cough I believe cough is a result of previous flu infection, post  respiratory syndrome continue Dulera but given injection of Depo-Medrol today - DG Chest 2 View; Future - methylPREDNISolone acetate (DEPO-MEDROL) injection 80 mg; Inject 1 mL (80 mg total) into the muscle once.  2. Acute bronchitis, unspecified organism Refill cough suppressant to take primarily at bedtime - HYDROcodone-homatropine (HYCODAN) 5-1.5 MG/5ML syrup; Take 5 mLs by mouth every 6 (six) hours as needed for cough.  Dispense: 120 mL; Refill: 0 Wardell Honour MD

## 2016-02-22 DIAGNOSIS — I517 Cardiomegaly: Secondary | ICD-10-CM | POA: Diagnosis not present

## 2016-02-22 DIAGNOSIS — G713 Mitochondrial myopathy, not elsewhere classified: Secondary | ICD-10-CM | POA: Diagnosis not present

## 2016-02-22 DIAGNOSIS — I5189 Other ill-defined heart diseases: Secondary | ICD-10-CM | POA: Diagnosis not present

## 2016-02-23 ENCOUNTER — Other Ambulatory Visit: Payer: Self-pay | Admitting: Family Medicine

## 2016-02-23 DIAGNOSIS — E119 Type 2 diabetes mellitus without complications: Secondary | ICD-10-CM

## 2016-03-07 ENCOUNTER — Other Ambulatory Visit: Payer: Self-pay | Admitting: Family Medicine

## 2016-04-04 ENCOUNTER — Other Ambulatory Visit: Payer: Self-pay | Admitting: Family Medicine

## 2016-04-14 DIAGNOSIS — M79641 Pain in right hand: Secondary | ICD-10-CM | POA: Diagnosis not present

## 2016-04-14 DIAGNOSIS — E119 Type 2 diabetes mellitus without complications: Secondary | ICD-10-CM | POA: Diagnosis not present

## 2016-04-14 DIAGNOSIS — S61441A Puncture wound with foreign body of right hand, initial encounter: Secondary | ICD-10-CM | POA: Diagnosis not present

## 2016-04-14 DIAGNOSIS — L0390 Cellulitis, unspecified: Secondary | ICD-10-CM | POA: Diagnosis not present

## 2016-04-15 ENCOUNTER — Ambulatory Visit: Payer: BLUE CROSS/BLUE SHIELD | Admitting: Family

## 2016-05-02 ENCOUNTER — Other Ambulatory Visit: Payer: Self-pay | Admitting: Family Medicine

## 2016-05-02 DIAGNOSIS — R635 Abnormal weight gain: Secondary | ICD-10-CM | POA: Diagnosis not present

## 2016-05-02 DIAGNOSIS — E291 Testicular hypofunction: Secondary | ICD-10-CM | POA: Diagnosis not present

## 2016-05-06 DIAGNOSIS — E785 Hyperlipidemia, unspecified: Secondary | ICD-10-CM | POA: Diagnosis not present

## 2016-05-06 DIAGNOSIS — E538 Deficiency of other specified B group vitamins: Secondary | ICD-10-CM | POA: Diagnosis not present

## 2016-05-06 DIAGNOSIS — K219 Gastro-esophageal reflux disease without esophagitis: Secondary | ICD-10-CM | POA: Diagnosis not present

## 2016-05-06 DIAGNOSIS — E7439 Other disorders of intestinal carbohydrate absorption: Secondary | ICD-10-CM | POA: Diagnosis not present

## 2016-05-06 DIAGNOSIS — E119 Type 2 diabetes mellitus without complications: Secondary | ICD-10-CM | POA: Diagnosis not present

## 2016-05-06 DIAGNOSIS — R790 Abnormal level of blood mineral: Secondary | ICD-10-CM | POA: Diagnosis not present

## 2016-05-12 DIAGNOSIS — E119 Type 2 diabetes mellitus without complications: Secondary | ICD-10-CM | POA: Diagnosis not present

## 2016-05-12 DIAGNOSIS — E785 Hyperlipidemia, unspecified: Secondary | ICD-10-CM | POA: Diagnosis not present

## 2016-05-12 DIAGNOSIS — E663 Overweight: Secondary | ICD-10-CM | POA: Diagnosis not present

## 2016-05-20 ENCOUNTER — Other Ambulatory Visit: Payer: Self-pay | Admitting: Family Medicine

## 2016-05-20 DIAGNOSIS — E538 Deficiency of other specified B group vitamins: Secondary | ICD-10-CM | POA: Diagnosis not present

## 2016-05-20 DIAGNOSIS — E663 Overweight: Secondary | ICD-10-CM | POA: Diagnosis not present

## 2016-05-20 DIAGNOSIS — E119 Type 2 diabetes mellitus without complications: Secondary | ICD-10-CM | POA: Diagnosis not present

## 2016-05-20 DIAGNOSIS — E7800 Pure hypercholesterolemia, unspecified: Secondary | ICD-10-CM | POA: Diagnosis not present

## 2016-05-27 DIAGNOSIS — E119 Type 2 diabetes mellitus without complications: Secondary | ICD-10-CM | POA: Diagnosis not present

## 2016-05-27 DIAGNOSIS — E7800 Pure hypercholesterolemia, unspecified: Secondary | ICD-10-CM | POA: Diagnosis not present

## 2016-05-27 DIAGNOSIS — R790 Abnormal level of blood mineral: Secondary | ICD-10-CM | POA: Diagnosis not present

## 2016-05-27 DIAGNOSIS — E663 Overweight: Secondary | ICD-10-CM | POA: Diagnosis not present

## 2016-06-03 ENCOUNTER — Other Ambulatory Visit: Payer: Self-pay | Admitting: Family Medicine

## 2016-06-03 DIAGNOSIS — E7800 Pure hypercholesterolemia, unspecified: Secondary | ICD-10-CM | POA: Diagnosis not present

## 2016-06-03 DIAGNOSIS — E119 Type 2 diabetes mellitus without complications: Secondary | ICD-10-CM | POA: Diagnosis not present

## 2016-06-03 DIAGNOSIS — E538 Deficiency of other specified B group vitamins: Secondary | ICD-10-CM | POA: Diagnosis not present

## 2016-06-03 DIAGNOSIS — E663 Overweight: Secondary | ICD-10-CM | POA: Diagnosis not present

## 2016-06-10 DIAGNOSIS — E663 Overweight: Secondary | ICD-10-CM | POA: Diagnosis not present

## 2016-06-10 DIAGNOSIS — E119 Type 2 diabetes mellitus without complications: Secondary | ICD-10-CM | POA: Diagnosis not present

## 2016-06-10 DIAGNOSIS — K219 Gastro-esophageal reflux disease without esophagitis: Secondary | ICD-10-CM | POA: Diagnosis not present

## 2016-06-10 DIAGNOSIS — R790 Abnormal level of blood mineral: Secondary | ICD-10-CM | POA: Diagnosis not present

## 2016-06-11 ENCOUNTER — Other Ambulatory Visit: Payer: Self-pay | Admitting: Family Medicine

## 2016-06-21 ENCOUNTER — Other Ambulatory Visit: Payer: Self-pay | Admitting: Family Medicine

## 2016-06-24 ENCOUNTER — Other Ambulatory Visit: Payer: Self-pay | Admitting: Family Medicine

## 2016-06-24 DIAGNOSIS — E7800 Pure hypercholesterolemia, unspecified: Secondary | ICD-10-CM | POA: Diagnosis not present

## 2016-06-24 DIAGNOSIS — E119 Type 2 diabetes mellitus without complications: Secondary | ICD-10-CM | POA: Diagnosis not present

## 2016-06-24 DIAGNOSIS — E663 Overweight: Secondary | ICD-10-CM | POA: Diagnosis not present

## 2016-06-25 NOTE — Telephone Encounter (Signed)
Flomax not on patient's current medication list.  Please advise.

## 2016-07-03 DIAGNOSIS — E291 Testicular hypofunction: Secondary | ICD-10-CM | POA: Diagnosis not present

## 2016-07-03 DIAGNOSIS — E663 Overweight: Secondary | ICD-10-CM | POA: Diagnosis not present

## 2016-07-04 ENCOUNTER — Other Ambulatory Visit: Payer: Self-pay | Admitting: Family Medicine

## 2016-07-08 DIAGNOSIS — E663 Overweight: Secondary | ICD-10-CM | POA: Diagnosis not present

## 2016-07-08 DIAGNOSIS — E119 Type 2 diabetes mellitus without complications: Secondary | ICD-10-CM | POA: Diagnosis not present

## 2016-07-08 DIAGNOSIS — E291 Testicular hypofunction: Secondary | ICD-10-CM | POA: Diagnosis not present

## 2016-07-17 DIAGNOSIS — K219 Gastro-esophageal reflux disease without esophagitis: Secondary | ICD-10-CM | POA: Diagnosis not present

## 2016-07-17 DIAGNOSIS — R790 Abnormal level of blood mineral: Secondary | ICD-10-CM | POA: Diagnosis not present

## 2016-07-17 DIAGNOSIS — E663 Overweight: Secondary | ICD-10-CM | POA: Diagnosis not present

## 2016-07-17 DIAGNOSIS — E119 Type 2 diabetes mellitus without complications: Secondary | ICD-10-CM | POA: Diagnosis not present

## 2016-07-22 DIAGNOSIS — E119 Type 2 diabetes mellitus without complications: Secondary | ICD-10-CM | POA: Diagnosis not present

## 2016-07-22 DIAGNOSIS — E663 Overweight: Secondary | ICD-10-CM | POA: Diagnosis not present

## 2016-07-22 DIAGNOSIS — R790 Abnormal level of blood mineral: Secondary | ICD-10-CM | POA: Diagnosis not present

## 2016-07-22 DIAGNOSIS — E7800 Pure hypercholesterolemia, unspecified: Secondary | ICD-10-CM | POA: Diagnosis not present

## 2016-07-30 DIAGNOSIS — E7800 Pure hypercholesterolemia, unspecified: Secondary | ICD-10-CM | POA: Diagnosis not present

## 2016-07-30 DIAGNOSIS — R790 Abnormal level of blood mineral: Secondary | ICD-10-CM | POA: Diagnosis not present

## 2016-07-30 DIAGNOSIS — E663 Overweight: Secondary | ICD-10-CM | POA: Diagnosis not present

## 2016-07-30 DIAGNOSIS — R635 Abnormal weight gain: Secondary | ICD-10-CM | POA: Diagnosis not present

## 2016-08-15 ENCOUNTER — Other Ambulatory Visit: Payer: Self-pay | Admitting: Family Medicine

## 2016-08-15 DIAGNOSIS — E119 Type 2 diabetes mellitus without complications: Secondary | ICD-10-CM

## 2016-08-18 NOTE — Telephone Encounter (Signed)
Last seen 02/08/16  Dr Sabra Heck   Dr Dettinger PCP

## 2016-09-06 ENCOUNTER — Other Ambulatory Visit: Payer: Self-pay | Admitting: Family Medicine

## 2016-09-16 DIAGNOSIS — H903 Sensorineural hearing loss, bilateral: Secondary | ICD-10-CM | POA: Diagnosis not present

## 2016-09-16 DIAGNOSIS — E119 Type 2 diabetes mellitus without complications: Secondary | ICD-10-CM | POA: Diagnosis not present

## 2016-09-16 DIAGNOSIS — G713 Mitochondrial myopathy, not elsewhere classified: Secondary | ICD-10-CM | POA: Diagnosis not present

## 2016-09-16 DIAGNOSIS — R252 Cramp and spasm: Secondary | ICD-10-CM | POA: Diagnosis not present

## 2016-09-16 DIAGNOSIS — R531 Weakness: Secondary | ICD-10-CM | POA: Diagnosis not present

## 2016-09-16 DIAGNOSIS — R1310 Dysphagia, unspecified: Secondary | ICD-10-CM | POA: Diagnosis not present

## 2016-09-17 ENCOUNTER — Other Ambulatory Visit: Payer: Self-pay | Admitting: Family Medicine

## 2016-09-18 NOTE — Telephone Encounter (Signed)
Last seen 02/08/16  Dr Dettinger

## 2016-09-21 ENCOUNTER — Other Ambulatory Visit: Payer: Self-pay | Admitting: Family Medicine

## 2016-09-21 DIAGNOSIS — E119 Type 2 diabetes mellitus without complications: Secondary | ICD-10-CM

## 2016-09-24 ENCOUNTER — Other Ambulatory Visit: Payer: Self-pay | Admitting: Family Medicine

## 2016-09-30 ENCOUNTER — Other Ambulatory Visit: Payer: Self-pay | Admitting: Family Medicine

## 2016-10-01 NOTE — Telephone Encounter (Signed)
last seen 02/08/16  Dr Sabra Heck  Dr Dettinger PCP

## 2016-11-13 ENCOUNTER — Other Ambulatory Visit: Payer: Self-pay | Admitting: Family Medicine

## 2016-11-24 ENCOUNTER — Other Ambulatory Visit: Payer: Self-pay | Admitting: Family Medicine

## 2016-11-24 DIAGNOSIS — E119 Type 2 diabetes mellitus without complications: Secondary | ICD-10-CM

## 2016-11-24 NOTE — Telephone Encounter (Signed)
Please have patient schedule an appt with his PCP, Dr Dettinger for follow up.  Refill x1 month sent to pharmacy.

## 2016-11-24 NOTE — Telephone Encounter (Signed)
Last seen 02/08/16  Dr Sabra Heck

## 2016-12-06 ENCOUNTER — Other Ambulatory Visit: Payer: Self-pay | Admitting: Family Medicine

## 2016-12-20 ENCOUNTER — Other Ambulatory Visit: Payer: Self-pay | Admitting: Family Medicine

## 2016-12-22 NOTE — Telephone Encounter (Signed)
OV 01/15/17

## 2017-01-05 ENCOUNTER — Other Ambulatory Visit: Payer: Self-pay | Admitting: Family Medicine

## 2017-01-05 DIAGNOSIS — E119 Type 2 diabetes mellitus without complications: Secondary | ICD-10-CM

## 2017-01-07 NOTE — Telephone Encounter (Signed)
I can refill this today as I have seen him once in the past but let him know that he needs to pick 1 provider and start seeing one provider for his routine checkups.  He is due for an appointment ASAP

## 2017-01-10 ENCOUNTER — Other Ambulatory Visit: Payer: Self-pay | Admitting: Family Medicine

## 2017-01-15 ENCOUNTER — Encounter: Payer: Self-pay | Admitting: Family Medicine

## 2017-01-15 ENCOUNTER — Ambulatory Visit (INDEPENDENT_AMBULATORY_CARE_PROVIDER_SITE_OTHER): Payer: BLUE CROSS/BLUE SHIELD | Admitting: Family Medicine

## 2017-01-15 VITALS — BP 138/86 | HR 76 | Temp 97.2°F | Ht 69.0 in | Wt 181.0 lb

## 2017-01-15 DIAGNOSIS — Z23 Encounter for immunization: Secondary | ICD-10-CM

## 2017-01-15 DIAGNOSIS — Z91018 Allergy to other foods: Secondary | ICD-10-CM | POA: Diagnosis not present

## 2017-01-15 DIAGNOSIS — Z0000 Encounter for general adult medical examination without abnormal findings: Secondary | ICD-10-CM | POA: Diagnosis not present

## 2017-01-15 DIAGNOSIS — E119 Type 2 diabetes mellitus without complications: Secondary | ICD-10-CM

## 2017-01-15 DIAGNOSIS — R41840 Attention and concentration deficit: Secondary | ICD-10-CM

## 2017-01-15 DIAGNOSIS — J4520 Mild intermittent asthma, uncomplicated: Secondary | ICD-10-CM

## 2017-01-15 LAB — BAYER DCA HB A1C WAIVED: HB A1C (BAYER DCA - WAIVED): 5 % (ref <7.0)

## 2017-01-15 MED ORDER — TAMSULOSIN HCL 0.4 MG PO CAPS: ORAL_CAPSULE | ORAL | 1 refills | Status: DC

## 2017-01-15 MED ORDER — ALBUTEROL SULFATE HFA 108 (90 BASE) MCG/ACT IN AERS: 2.0000 | INHALATION_SPRAY | RESPIRATORY_TRACT | 4 refills | Status: DC | PRN

## 2017-01-15 MED ORDER — LOSARTAN POTASSIUM 50 MG PO TABS: ORAL_TABLET | ORAL | 1 refills | Status: DC

## 2017-01-15 MED ORDER — FLUTICASONE PROPIONATE 50 MCG/ACT NA SUSP: 1.0000 | Freq: Every day | NASAL | 1 refills | Status: DC

## 2017-01-15 MED ORDER — LIRAGLUTIDE 18 MG/3ML ~~LOC~~ SOPN: PEN_INJECTOR | SUBCUTANEOUS | 1 refills | Status: DC

## 2017-01-15 MED ORDER — ESOMEPRAZOLE MAGNESIUM 40 MG PO CPDR: DELAYED_RELEASE_CAPSULE | ORAL | 1 refills | Status: DC

## 2017-01-15 MED ORDER — AMITRIPTYLINE HCL 25 MG PO TABS: 75.0000 mg | ORAL_TABLET | Freq: Every day | ORAL | 1 refills | Status: DC

## 2017-01-15 MED ORDER — MONTELUKAST SODIUM 10 MG PO TABS: ORAL_TABLET | ORAL | 1 refills | Status: DC

## 2017-01-15 MED ORDER — MECLIZINE HCL 25 MG PO CHEW: 1.0000 | CHEWABLE_TABLET | Freq: Every day | ORAL | 1 refills | Status: DC

## 2017-01-15 MED ORDER — MOMETASONE FURO-FORMOTEROL FUM 100-5 MCG/ACT IN AERO: INHALATION_SPRAY | RESPIRATORY_TRACT | 1 refills | Status: DC

## 2017-01-15 MED ORDER — FINASTERIDE 5 MG PO TABS: 5.0000 mg | ORAL_TABLET | Freq: Every day | ORAL | 1 refills | Status: DC

## 2017-01-15 MED ORDER — AZELASTINE HCL 0.1 % NA SOLN: NASAL | 1 refills | Status: DC

## 2017-01-15 MED ORDER — FUROSEMIDE 20 MG PO TABS: ORAL_TABLET | ORAL | 1 refills | Status: DC

## 2017-01-15 NOTE — Progress Notes (Signed)
HPI  Patient presents today  here for annual physical exam.  Patient feels well, he has no complaints except forwith difficulty with attention.  He tried atomoxetine but did not tolerate it. He would like an official evaluation.    Patient is occupationally active. He does watch his diet. As it pertains to diabetes he does not check his blood sugar, however he does report very good Victoza compliance.  Meat allergy- well, he would like to repeat labs today.   Asthma. Needs refill of Dulera and albuterol, feels well controlled   PMH: Smoking status noted ROS: Per HPI  Objective: BP 138/86   Pulse 76   Temp (!) 97.2 F (36.2 C) (Oral)   Ht 5' 9" (1.753 m)   Wt 181 lb (82.1 kg)   BMI 26.73 kg/m  Gen: NAD, alert, cooperative with exam HEENT: NCAT, oropharynx moist and clear, nares clear, TMs CV: RRR, good S1/S2, no murmur Resp: CTABL, no wheezes, non-labored Abd: SNTND, BS present, no guarding or organomegaly Ext: No edema, warm Neuro: Alert and oriented, No gross deficits  Assessment and plan:  #Annual exam Normal exam, weight is almost normal. Labs today, patient has had yogurt earlier today.  #Meat allergy, alpha gal Repeat labs, recommended allergy referral  #Mild intermittent asthma Refill Dulera and albuterol, well-controlled  #Attention deficit Refer to psychiatry, given information to call [place] [facility]  Diabetes Previously well controlled, A1c pending, expect good control Continue Victoza, follow-up 6 months at the latest    Orders Placed This Encounter  Procedures  . CMP14+EGFR  . CBC with Differential/Platelet  . Lipid panel  . TSH  . Alpha-Gal Panel  . Ambulatory referral to Psychiatry    Referral Priority:   Routine    Referral Type:   Psychiatric    Referral Reason:   Specialty Services Required    Requested Specialty:   Psychiatry    Number of Visits Requested:   1    Meds ordered this encounter  Medications  .  albuterol (PROAIR HFA) 108 (90 Base) MCG/ACT inhaler    Sig: Inhale 2 puffs into the lungs every 4 (four) hours as needed for wheezing or shortness of breath.    Dispense:  1 Inhaler    Refill:  4  . liraglutide (VICTOZA) 18 MG/3ML SOPN    Sig: INJECT 0.3ML SQ DAILY    Dispense:  9 mL    Refill:  1  . amitriptyline (ELAVIL) 25 MG tablet    Sig: Take 3 tablets (75 mg total) by mouth at bedtime.    Dispense:  270 tablet    Refill:  1  . azelastine (ASTELIN) 0.1 % nasal spray    Sig: USE 2 SPRAYS IN EACH NOSTRIL AT BEDTIME    Dispense:  30 mL    Refill:  1  . mometasone-formoterol (DULERA) 100-5 MCG/ACT AERO    Sig: USE 2 PUFFS TWICE DAILY    Dispense:  13 g    Refill:  1  . esomeprazole (NEXIUM) 40 MG capsule    Sig: TAKE ONE (1) CAPSULE EACH DAY    Dispense:  90 capsule    Refill:  1  . finasteride (PROSCAR) 5 MG tablet    Sig: Take 1 tablet (5 mg total) by mouth daily.    Dispense:  90 tablet    Refill:  1  . fluticasone (FLONASE) 50 MCG/ACT nasal spray    Sig: Place 1-2 sprays into both nostrils daily.    Dispense:  48 g    Refill:  1  . furosemide (LASIX) 20 MG tablet    Sig: TAKE ONE (1) TABLET EACH DAY    Dispense:  90 tablet    Refill:  1  . losartan (COZAAR) 50 MG tablet    Sig: TAKE ONE (1) TABLET EACH DAY    Dispense:  90 tablet    Refill:  1  . Meclizine HCl 25 MG CHEW    Sig: Chew 1 tablet (25 mg total) by mouth at bedtime.    Dispense:  90 tablet    Refill:  1  . montelukast (SINGULAIR) 10 MG tablet    Sig: TAKE ONE (1) TABLET EACH DAY    Dispense:  90 tablet    Refill:  1  . tamsulosin (FLOMAX) 0.4 MG CAPS capsule    Sig: TAKE ONE (1) CAPSULE EACH DAY    Dispense:  90 capsule    Refill:  Carroll, MD [facility] 01/15/2017, 11:23 AM

## 2017-01-15 NOTE — Patient Instructions (Addendum)
Great to see you!  Come back in 6 months unless you need [place] sooner.   Your provider wants you to schedule an appointment with a Psychologist/Psychiatrist. The following list of offices requires the patient to call and make their own appointment, as there is information they need that only you can provide. Please feel free to choose form the following providers:  [facility] [place]  [place]  [phone] [place], [place]  (Scheduled through [facility]) Must call and do an interview for appointment. Sees Children / Accepts Medicaid  [facility] [place]  [address] [place]    [place], [place]  [phone] [place], [place] for Autism but does not treat it Sees Children / Accepts Medicaid  [facility]    [phone] [address], [place], [place] Medication management, substance abuse, bipolar, grief, family, marriage, OCD, anxiety, PTSD Sees children / Accepts Medicaid  [facility]    [phone] [address], [place], [place] children / Accepts [facility]  [facility]  [phone] [address] [place], [place]   Dr Lorenza Evangelist     [phone] [address] [place], [place]  Sees ADD & ADHD for treatment Accepts Medicaid  [facility]  [phone] Arlean Hopping, [place] for Autism Accepts [facility]  [facility]  [phone] [place], [place]  Does Adult ADD evaluations Does not accept Medicaid  [facility]   [phone] [place], [place] children as young as 32 years old Accepts [facility]     [phone]    [place], [place] [address] Sees  children Accepts Medicaid

## 2017-01-19 LAB — CMP14+EGFR
ALBUMIN: 5 g/dL (ref 3.5–5.5)
ALT: 30 IU/L (ref 0–44)
AST: 27 IU/L (ref 0–40)
Albumin/Globulin Ratio: 1.9 (ref 1.2–2.2)
Alkaline Phosphatase: 125 IU/L — ABNORMAL HIGH (ref 39–117)
BUN / CREAT RATIO: 17 (ref 9–20)
BUN: 19 mg/dL (ref 6–24)
Bilirubin Total: 0.4 mg/dL (ref 0.0–1.2)
CALCIUM: 9.8 mg/dL (ref 8.7–10.2)
CO2: 25 mmol/L (ref 20–29)
Chloride: 97 mmol/L (ref 96–106)
Creatinine, Ser: 1.11 mg/dL (ref 0.76–1.27)
GFR, EST AFRICAN AMERICAN: 84 mL/min/{1.73_m2} (ref >59)
GFR, EST NON AFRICAN AMERICAN: 73 mL/min/{1.73_m2} (ref >59)
GLOBULIN, TOTAL: 2.7 g/dL (ref 1.5–4.5)
Glucose: 75 mg/dL (ref 65–99)
Potassium: 4.3 mmol/L (ref 3.5–5.2)
SODIUM: 141 mmol/L (ref 134–144)
Total Protein: 7.7 g/dL (ref 6.0–8.5)

## 2017-01-19 LAB — CBC WITH DIFFERENTIAL/PLATELET
BASOS: 0 %
Basophils Absolute: 0 10*3/uL (ref 0.0–0.2)
EOS (ABSOLUTE): 0.2 10*3/uL (ref 0.0–0.4)
EOS: 3 %
HEMATOCRIT: 41.3 % (ref 37.5–51.0)
Hemoglobin: 14.4 g/dL (ref 13.0–17.7)
IMMATURE GRANULOCYTES: 1 %
Immature Grans (Abs): 0 10*3/uL (ref 0.0–0.1)
LYMPHS ABS: 1.6 10*3/uL (ref 0.7–3.1)
Lymphs: 25 %
MCH: 32.2 pg (ref 26.6–33.0)
MCHC: 34.9 g/dL (ref 31.5–35.7)
MCV: 92 fL (ref 79–97)
MONOS ABS: 0.6 10*3/uL (ref 0.1–0.9)
Monocytes: 9 %
NEUTROS ABS: 3.9 10*3/uL (ref 1.4–7.0)
Neutrophils: 62 %
Platelets: 279 10*3/uL (ref 150–379)
RBC: 4.47 x10E6/uL (ref 4.14–5.80)
RDW: 13.4 % (ref 12.3–15.4)
WBC: 6.3 10*3/uL (ref 3.4–10.8)

## 2017-01-19 LAB — LIPID PANEL
CHOL/HDL RATIO: 2.3 ratio (ref 0.0–5.0)
Cholesterol, Total: 202 mg/dL — ABNORMAL HIGH (ref 100–199)
HDL: 89 mg/dL (ref >39)
LDL Calculated: 100 mg/dL — ABNORMAL HIGH (ref 0–99)
Triglycerides: 63 mg/dL (ref 0–149)
VLDL Cholesterol Cal: 13 mg/dL (ref 5–40)

## 2017-01-19 LAB — ALPHA-GAL PANEL
ALPHA GAL IGE: 19.1 kU/L — AB (ref <0.10)
Beef (Bos spp) IgE: 12.3 kU/L — ABNORMAL HIGH (ref <0.35)
Class Interpretation: 2
Class Interpretation: 3
Class Interpretation: 3
LAMB/MUTTON (OVIS SPP) IGE: 1.8 kU/L — AB (ref <0.35)
Pork (Sus spp) IgE: 6.6 kU/L — ABNORMAL HIGH (ref <0.35)

## 2017-01-19 LAB — TSH: TSH: 1.2 u[IU]/mL (ref 0.450–4.500)

## 2017-01-20 ENCOUNTER — Encounter: Payer: Self-pay | Admitting: Family Medicine

## 2017-02-04 DIAGNOSIS — F419 Anxiety disorder, unspecified: Secondary | ICD-10-CM | POA: Diagnosis not present

## 2017-02-04 DIAGNOSIS — R41840 Attention and concentration deficit: Secondary | ICD-10-CM | POA: Diagnosis not present

## 2017-02-04 DIAGNOSIS — F902 Attention-deficit hyperactivity disorder, combined type: Secondary | ICD-10-CM | POA: Diagnosis not present

## 2017-02-04 DIAGNOSIS — Z79899 Other long term (current) drug therapy: Secondary | ICD-10-CM | POA: Diagnosis not present

## 2017-03-10 DIAGNOSIS — Z79899 Other long term (current) drug therapy: Secondary | ICD-10-CM | POA: Diagnosis not present

## 2017-03-10 DIAGNOSIS — F902 Attention-deficit hyperactivity disorder, combined type: Secondary | ICD-10-CM | POA: Diagnosis not present

## 2017-03-10 DIAGNOSIS — G713 Mitochondrial myopathy, not elsewhere classified: Secondary | ICD-10-CM | POA: Diagnosis not present

## 2017-03-14 ENCOUNTER — Other Ambulatory Visit: Payer: Self-pay | Admitting: Family Medicine

## 2017-04-15 DIAGNOSIS — R790 Abnormal level of blood mineral: Secondary | ICD-10-CM | POA: Diagnosis not present

## 2017-04-15 DIAGNOSIS — E291 Testicular hypofunction: Secondary | ICD-10-CM | POA: Diagnosis not present

## 2017-04-16 DIAGNOSIS — D649 Anemia, unspecified: Secondary | ICD-10-CM | POA: Diagnosis not present

## 2017-04-16 DIAGNOSIS — E538 Deficiency of other specified B group vitamins: Secondary | ICD-10-CM | POA: Diagnosis not present

## 2017-04-16 DIAGNOSIS — R6882 Decreased libido: Secondary | ICD-10-CM | POA: Diagnosis not present

## 2017-04-16 DIAGNOSIS — E291 Testicular hypofunction: Secondary | ICD-10-CM | POA: Diagnosis not present

## 2017-04-23 ENCOUNTER — Other Ambulatory Visit: Payer: Self-pay | Admitting: Family Medicine

## 2017-04-23 DIAGNOSIS — E785 Hyperlipidemia, unspecified: Secondary | ICD-10-CM

## 2017-04-27 ENCOUNTER — Other Ambulatory Visit: Payer: Self-pay | Admitting: Family Medicine

## 2017-04-27 DIAGNOSIS — E785 Hyperlipidemia, unspecified: Secondary | ICD-10-CM

## 2017-05-11 ENCOUNTER — Other Ambulatory Visit: Payer: Self-pay | Admitting: Family Medicine

## 2017-05-18 ENCOUNTER — Other Ambulatory Visit: Payer: Self-pay | Admitting: Family Medicine

## 2017-05-18 DIAGNOSIS — R6882 Decreased libido: Secondary | ICD-10-CM | POA: Diagnosis not present

## 2017-05-18 DIAGNOSIS — E119 Type 2 diabetes mellitus without complications: Secondary | ICD-10-CM

## 2017-05-18 DIAGNOSIS — E291 Testicular hypofunction: Secondary | ICD-10-CM | POA: Diagnosis not present

## 2017-05-19 DIAGNOSIS — D649 Anemia, unspecified: Secondary | ICD-10-CM | POA: Diagnosis not present

## 2017-05-19 DIAGNOSIS — R6882 Decreased libido: Secondary | ICD-10-CM | POA: Diagnosis not present

## 2017-05-19 DIAGNOSIS — E291 Testicular hypofunction: Secondary | ICD-10-CM | POA: Diagnosis not present

## 2017-05-19 DIAGNOSIS — Z72820 Sleep deprivation: Secondary | ICD-10-CM | POA: Diagnosis not present

## 2017-06-03 ENCOUNTER — Ambulatory Visit (HOSPITAL_COMMUNITY): Admission: RE | Admit: 2017-06-03 | Payer: BLUE CROSS/BLUE SHIELD | Source: Ambulatory Visit

## 2017-06-03 ENCOUNTER — Ambulatory Visit: Payer: BLUE CROSS/BLUE SHIELD | Admitting: Family Medicine

## 2017-06-03 ENCOUNTER — Encounter: Payer: Self-pay | Admitting: Family Medicine

## 2017-06-03 ENCOUNTER — Ambulatory Visit (HOSPITAL_COMMUNITY): Payer: BLUE CROSS/BLUE SHIELD

## 2017-06-03 VITALS — BP 136/80 | HR 79 | Temp 97.7°F | Ht 69.0 in | Wt 187.0 lb

## 2017-06-03 DIAGNOSIS — R202 Paresthesia of skin: Secondary | ICD-10-CM | POA: Diagnosis not present

## 2017-06-03 DIAGNOSIS — R200 Anesthesia of skin: Secondary | ICD-10-CM | POA: Diagnosis not present

## 2017-06-03 DIAGNOSIS — R29818 Other symptoms and signs involving the nervous system: Secondary | ICD-10-CM

## 2017-06-03 NOTE — Progress Notes (Signed)
BP 136/80   Pulse 79   Temp 97.7 F (36.5 C) (Oral)   Ht 5\' 9"  (1.753 m)   Wt 187 lb (84.8 kg)   BMI 27.62 kg/m    Subjective:    Patient ID: Johnny Mathews, male    DOB: 1960/05/14, 57 y.o.   MRN: [id number]  HPI: Johnny Mathews is a 57 y.o. male presenting on 06/03/2017 for tingling in back and ears and ringing in both ears   HPI Tingling and numbness and ringing in both ears Patient comes in with complaints of tingling and numbness that started in the center of his upper back and he feels like it is now spread to the back of his head on both sides and just since yesterday it is now going down the posterior aspect of both of his arms all the way to his hands.  He denies any weakness or pain anywhere along this course including he does not have any pain in his neck or upper back.  He does complain of a frontal headache that is been intermittent over the past month.  The numbness on his back started a month ago but the other stuff is just been within the past week to a day.  The headaches have been coming about once to twice per week.  He denies any visual deficits or changes in taste or smell.  He has chronic hearing loss already and had some ringing before but he feels like the ringing is worse.  He denies any lightheadedness or dizziness or feeling is going to pass out.  He denies any chest pain or palpitations or shortness of breath or wheezing.  He denies any fevers or chills.  He denies any overlying skin changes or rash.  Relevant past medical, surgical, family and social history reviewed and updated as indicated. Interim medical history since our last visit reviewed. Allergies and medications reviewed and updated.  Review of Systems  Constitutional: Negative for chills and fever.  Respiratory: Negative for shortness of breath and wheezing.   Cardiovascular: Negative for chest pain and leg swelling.  Musculoskeletal: Negative for back pain, gait problem, neck pain and neck  stiffness.  Skin: Negative for color change and rash.  Neurological: Positive for numbness. Negative for dizziness, speech difficulty, weakness, light-headedness and headaches.  All other systems reviewed and are negative.   Per HPI unless specifically indicated above   Allergies as of 06/03/2017      Reactions   Eggs Or Egg-derived Products Swelling   Throat   Septra [sulfamethoxazole-trimethoprim] Swelling   throat   Sulfonamide Derivatives    REACTION: hives      Medication List        Accurate as of 06/03/17  9:59 AM. Always use your most recent med list.          albuterol 108 (90 Base) MCG/ACT inhaler Commonly known as:  PROAIR HFA Inhale 2 puffs into the lungs every 4 (four) hours as needed for wheezing or shortness of breath.   amitriptyline 25 MG tablet Commonly known as:  ELAVIL Take 3 tablets (75 mg total) by mouth at bedtime.   azelastine 0.1 % nasal spray Commonly known as:  ASTELIN USE 2 SPRAYS IN EACH NOSTRIL AT BEDTIME   EPINEPHrine 0.3 mg/0.3 mL Devi Commonly known as:  EPI-PEN Inject 0.3 mg into the skin as needed.   esomeprazole 40 MG capsule Commonly known as:  NEXIUM TAKE ONE (1) CAPSULE EACH DAY   finasteride  5 MG tablet Commonly known as:  PROSCAR Take 1 tablet (5 mg total) by mouth daily.   fluticasone 50 MCG/ACT nasal spray Commonly known as:  FLONASE Place 1-2 sprays into both nostrils daily.   furosemide 20 MG tablet Commonly known as:  LASIX TAKE ONE (1) TABLET EACH DAY   liraglutide 18 MG/3ML Sopn Commonly known as:  VICTOZA INJECT 0.3ML SQ DAILY   losartan 50 MG tablet Commonly known as:  COZAAR TAKE ONE (1) TABLET EACH DAY   Meclizine HCl 25 MG Chew Chew 1 tablet (25 mg total) by mouth at bedtime.   mometasone-formoterol 100-5 MCG/ACT Aero Commonly known as:  DULERA USE 2 PUFFS TWICE DAILY   montelukast 10 MG tablet Commonly known as:  SINGULAIR TAKE ONE (1) TABLET EACH DAY   pravastatin 80 MG tablet Commonly  known as:  PRAVACHOL TAKE ONE (1) TABLET EACH DAY   ranitidine 150 MG tablet Commonly known as:  ZANTAC TAKE ONE TABLET BY MOUTH TWICE DAILY   tamsulosin 0.4 MG Caps capsule Commonly known as:  FLOMAX TAKE ONE (1) CAPSULE EACH DAY          Objective:    BP 136/80   Pulse 79   Temp 97.7 F (36.5 C) (Oral)   Ht 5\' 9"  (1.753 m)   Wt 187 lb (84.8 kg)   BMI 27.62 kg/m   Wt Readings from Last 3 Encounters:  06/03/17 187 lb (84.8 kg)  01/15/17 181 lb (82.1 kg)  02/08/16 203 lb (92.1 kg)    Physical Exam  Constitutional: He is oriented to person, place, and time. He appears well-developed and well-nourished. No distress.  Eyes: Pupils are equal, round, and reactive to light. Conjunctivae and EOM are normal. No scleral icterus.  Neck: Neck supple. No thyromegaly present.  Cardiovascular: Normal rate, regular rhythm and intact distal pulses.  Murmur (Grade 3 out of 6 crescendo/decrescendo murmur best heard in systolic region) heard. Pulmonary/Chest: Effort normal and breath sounds normal. No respiratory distress. He has no wheezes.  Musculoskeletal: Normal range of motion. He exhibits no edema or tenderness (No tenderness in neck or upper back on exam.  No loss of range of motion).  Lymphadenopathy:    He has no cervical adenopathy.  Neurological: He is alert and oriented to person, place, and time. Coordination normal.  Skin: Skin is warm and dry. No rash noted. He is not diaphoretic.  Psychiatric: He has a normal mood and affect. His behavior is normal.  Nursing note and vitals reviewed.      Assessment & Plan:   Problem List Items Addressed This Visit    None    Visit Diagnoses    Numbness and tingling of both upper extremities    -  Primary   Relevant Orders   MR Cervical Spine Wo Contrast   TSH   Vitamin B12   CBC with Differential/Platelet   Numbness and tingling sensation of skin       Feels numbness on the back of his upper back and towards both ears but  worse on right side   Relevant Orders   MR Cervical Spine Wo Contrast   TSH   Vitamin B12   CBC with Differential/Platelet   Other symptoms and signs involving the nervous system       Relevant Orders   MR Brain Wo Contrast   MR Cervical Spine Wo Contrast   TSH   Vitamin B12   CBC with Differential/Platelet  Follow up plan: Return in about 2 weeks (around 06/17/2017), or if symptoms worsen or fail to improve, for Follow-up numbness.  Counseling provided for all of the vaccine components Orders Placed This Encounter  Procedures  . MR Brain Wo Contrast  . MR Cervical Spine Wo Contrast  . TSH  . Vitamin B12  . CBC with Differential/Platelet    Caryl Pina, MD [facility] 06/03/2017, 9:59 AM

## 2017-06-04 ENCOUNTER — Encounter: Payer: Self-pay | Admitting: Family Medicine

## 2017-06-04 ENCOUNTER — Ambulatory Visit (HOSPITAL_COMMUNITY)
Admission: RE | Admit: 2017-06-04 | Discharge: 2017-06-04 | Disposition: A | Discharge status: Home / Self Care | Payer: BLUE CROSS/BLUE SHIELD | Source: Ambulatory Visit | Attending: Family Medicine | Admitting: Family Medicine

## 2017-06-04 DIAGNOSIS — R29818 Other symptoms and signs involving the nervous system: Secondary | ICD-10-CM | POA: Diagnosis present

## 2017-06-04 DIAGNOSIS — R202 Paresthesia of skin: Secondary | ICD-10-CM

## 2017-06-04 DIAGNOSIS — M50322 Other cervical disc degeneration at C5-C6 level: Secondary | ICD-10-CM | POA: Insufficient documentation

## 2017-06-04 DIAGNOSIS — M542 Cervicalgia: Secondary | ICD-10-CM | POA: Diagnosis not present

## 2017-06-04 DIAGNOSIS — R200 Anesthesia of skin: Secondary | ICD-10-CM | POA: Diagnosis not present

## 2017-06-04 DIAGNOSIS — M4802 Spinal stenosis, cervical region: Secondary | ICD-10-CM | POA: Insufficient documentation

## 2017-06-04 LAB — CBC WITH DIFFERENTIAL/PLATELET
BASOS: 1 %
Basophils Absolute: 0 10*3/uL (ref 0.0–0.2)
EOS (ABSOLUTE): 0.3 10*3/uL (ref 0.0–0.4)
EOS: 5 %
HEMATOCRIT: 41.7 % (ref 37.5–51.0)
HEMOGLOBIN: 13.7 g/dL (ref 13.0–17.7)
IMMATURE GRANULOCYTES: 0 %
Immature Grans (Abs): 0 10*3/uL (ref 0.0–0.1)
Lymphocytes Absolute: 1.5 10*3/uL (ref 0.7–3.1)
Lymphs: 28 %
MCH: 31.8 pg (ref 26.6–33.0)
MCHC: 32.9 g/dL (ref 31.5–35.7)
MCV: 97 fL (ref 79–97)
MONOCYTES: 12 %
Monocytes Absolute: 0.6 10*3/uL (ref 0.1–0.9)
Neutrophils Absolute: 3 10*3/uL (ref 1.4–7.0)
Neutrophils: 54 %
Platelets: 260 10*3/uL (ref 150–450)
RBC: 4.31 x10E6/uL (ref 4.14–5.80)
RDW: 13.9 % (ref 12.3–15.4)
WBC: 5.4 10*3/uL (ref 3.4–10.8)

## 2017-06-04 LAB — VITAMIN B12: Vitamin B-12: 650 pg/mL (ref 232–1245)

## 2017-06-04 LAB — TSH: TSH: 2.04 u[IU]/mL (ref 0.450–4.500)

## 2017-06-05 ENCOUNTER — Ambulatory Visit: Payer: BLUE CROSS/BLUE SHIELD

## 2017-06-05 ENCOUNTER — Encounter: Payer: Self-pay | Admitting: Family Medicine

## 2017-06-05 ENCOUNTER — Other Ambulatory Visit: Payer: Self-pay

## 2017-06-05 DIAGNOSIS — M4802 Spinal stenosis, cervical region: Secondary | ICD-10-CM

## 2017-06-05 DIAGNOSIS — E785 Hyperlipidemia, unspecified: Secondary | ICD-10-CM

## 2017-06-05 DIAGNOSIS — E119 Type 2 diabetes mellitus without complications: Secondary | ICD-10-CM

## 2017-06-05 NOTE — Progress Notes (Signed)
rescheduled

## 2017-06-08 ENCOUNTER — Telehealth: Payer: Self-pay | Admitting: Family Medicine

## 2017-06-08 MED ORDER — TADALAFIL 20 MG PO TABS: 10.0000 mg | ORAL_TABLET | ORAL | 11 refills | Status: DC | PRN

## 2017-06-08 MED ORDER — LIRAGLUTIDE 18 MG/3ML ~~LOC~~ SOPN: PEN_INJECTOR | SUBCUTANEOUS | 3 refills | Status: DC

## 2017-06-08 MED ORDER — AMITRIPTYLINE HCL 25 MG PO TABS: 75.0000 mg | ORAL_TABLET | Freq: Every day | ORAL | 3 refills | Status: DC

## 2017-06-08 MED ORDER — MONTELUKAST SODIUM 10 MG PO TABS: ORAL_TABLET | ORAL | 3 refills | Status: DC

## 2017-06-08 MED ORDER — FUROSEMIDE 20 MG PO TABS: 20.0000 mg | ORAL_TABLET | Freq: Every day | ORAL | 3 refills | Status: DC | PRN

## 2017-06-08 MED ORDER — ESOMEPRAZOLE MAGNESIUM 40 MG PO CPDR: DELAYED_RELEASE_CAPSULE | ORAL | 3 refills | Status: DC

## 2017-06-08 MED ORDER — PRAVASTATIN SODIUM 80 MG PO TABS: 80.0000 mg | ORAL_TABLET | Freq: Every day | ORAL | 3 refills | Status: DC

## 2017-06-08 MED ORDER — RANITIDINE HCL 150 MG PO TABS: 150.0000 mg | ORAL_TABLET | Freq: Two times a day (BID) | ORAL | 3 refills | Status: DC

## 2017-06-08 MED ORDER — FINASTERIDE 5 MG PO TABS: 5.0000 mg | ORAL_TABLET | Freq: Every day | ORAL | 3 refills | Status: DC

## 2017-06-08 MED ORDER — LOSARTAN POTASSIUM 50 MG PO TABS: ORAL_TABLET | ORAL | 3 refills | Status: DC

## 2017-06-08 MED ORDER — MOMETASONE FURO-FORMOTEROL FUM 100-5 MCG/ACT IN AERO: INHALATION_SPRAY | RESPIRATORY_TRACT | 3 refills | Status: DC

## 2017-06-08 MED ORDER — TAMSULOSIN HCL 0.4 MG PO CAPS: ORAL_CAPSULE | ORAL | 1 refills | Status: DC

## 2017-06-08 NOTE — Addendum Note (Signed)
Addended by: Caryl Pina on: 06/08/2017 01:21 PM   Modules accepted: Orders

## 2017-06-09 DIAGNOSIS — Z79899 Other long term (current) drug therapy: Secondary | ICD-10-CM | POA: Diagnosis not present

## 2017-06-09 DIAGNOSIS — F902 Attention-deficit hyperactivity disorder, combined type: Secondary | ICD-10-CM | POA: Diagnosis not present

## 2017-06-09 DIAGNOSIS — G713 Mitochondrial myopathy, not elsewhere classified: Secondary | ICD-10-CM | POA: Diagnosis not present

## 2017-06-23 ENCOUNTER — Other Ambulatory Visit: Payer: Self-pay | Admitting: Family Medicine

## 2017-06-24 DIAGNOSIS — M50122 Cervical disc disorder at C5-C6 level with radiculopathy: Secondary | ICD-10-CM | POA: Diagnosis not present

## 2017-06-24 DIAGNOSIS — Z6827 Body mass index (BMI) 27.0-27.9, adult: Secondary | ICD-10-CM | POA: Diagnosis not present

## 2017-06-24 DIAGNOSIS — M50123 Cervical disc disorder at C6-C7 level with radiculopathy: Secondary | ICD-10-CM | POA: Diagnosis not present

## 2017-06-30 ENCOUNTER — Ambulatory Visit: Payer: BLUE CROSS/BLUE SHIELD | Attending: Neurosurgery | Admitting: Physical Therapy

## 2017-06-30 ENCOUNTER — Encounter: Payer: Self-pay | Admitting: Physical Therapy

## 2017-06-30 ENCOUNTER — Telehealth: Payer: Self-pay

## 2017-06-30 DIAGNOSIS — M542 Cervicalgia: Secondary | ICD-10-CM | POA: Diagnosis not present

## 2017-06-30 DIAGNOSIS — M5413 Radiculopathy, cervicothoracic region: Secondary | ICD-10-CM | POA: Insufficient documentation

## 2017-06-30 MED ORDER — GLUCOSE BLOOD VI STRP: ORAL_STRIP | 5 refills | Status: AC

## 2017-06-30 MED ORDER — CONTOUR BLOOD GLUCOSE SYSTEM W/DEVICE KIT: PACK | 5 refills | Status: AC

## 2017-06-30 NOTE — Telephone Encounter (Signed)
We can go ahead and send him in the new test strips and a new meter on 1 of the preferred types.  Can you go ahead and do this for me.

## 2017-06-30 NOTE — Telephone Encounter (Signed)
Contour meter & strips sent into pharmacy

## 2017-06-30 NOTE — Telephone Encounter (Signed)
Insurance denied prior [place] for [employer] test strips  Preferred are [employer] and Contour next

## 2017-06-30 NOTE — Therapy (Signed)
[facility] [place], [place], [address] Phone: [phone]   Fax:  [phone]  Physical Therapy Evaluation  Patient Details  Name: Johnny Mathews MRN: [id number] Date of Birth: 05-Mar-1960 Referring Provider: Consuella Lose, MD   Encounter Date: 06/30/2017  PT End of Session - 06/30/17 2202    Visit Number  1    Number of Visits  12    Date for PT Re-Evaluation  08/18/17    Authorization Type  progress note every 10th visit    PT Start Time  1516    PT Stop Time  1600    PT Time Calculation (min)  44 min    Activity Tolerance  Patient tolerated treatment well    Behavior During Therapy  [facility] for tasks assessed/performed       Past Medical History:  Diagnosis Date  . Alpha galactosidase deficiency   . Asthma   . BPH (benign prostatic hypertrophy)   . Diabetes mellitus without complication ([place])   . GERD (gastroesophageal reflux disease)   . Hyperlipidemia   . Muscular dystrophy [facility])     Past Surgical History:  Procedure Laterality Date  . KIDNEY STONE SURGERY    . KNEE SURGERY Right   . SHOULDER OPEN ROTATOR CUFF REPAIR      There were no vitals filed for this visit.   Subjective Assessment - 06/30/17 2149    Subjective  Patient arrives to physical therapy with reports of neck pain/discomfort, numbness and tingling across mid upper back and down both UEs to finger tips. Patient reported symptoms began about two months ago. Patient stated symptoms of numbness and tingling started in the upper back across shoulder blades then progressively worsened by running down both UEs. Patient stated he has not paid too close attention to notice which fingers are mostly affected. Patient stated MRI reported a "pinched nerve and stenosis at C5-C6."  Patient reports pain at worst is 3/10 and is caused by an increase in activity any laying flat on bed. Patient reports pain at best is 0/10. Patient states rest decreases pain.  Patient's goals are to decrease pain/discomfort, reduce numbness and tingling, and return to prior level of function.    Pertinent History  Mitochondrial Muscular Dystrophy, DM, Asthma, previous left RTC surgery    Limitations  Lifting;House hold activities    Diagnostic tests  MRI    Patient Stated Goals  decrease pain and return to PLOF    Currently in Pain?  Yes    Pain Score  2     Pain Location  Neck    Pain Orientation  Left;Posterior;Right    Pain Descriptors / Indicators  Discomfort    Pain Type  Chronic pain    Pain Onset  More than a month ago    Pain Frequency  Intermittent    Aggravating Factors   increase activities    Pain Relieving Factors  rest         [facility] PT Assessment - 06/30/17 0001      Assessment   Medical Diagnosis  cervical disc disorder at C5-C6 with radiculopathy    Referring Provider  Consuella Lose, MD    Onset Date/Surgical Date  -- 2 months ago    Hand Dominance  Right    Next MD Visit  n/a    Prior Therapy  no      Precautions   Precautions  None      Balance Screen   Has the  patient fallen in the past 6 months  Yes    How many times?  1    Has the patient had a decrease in activity level because of a fear of falling?   No    Is the patient reluctant to leave their home because of a fear of falling?   No      Home Film/video editor residence      Prior Function   Level of Independence  Independent    Vocation  Full time employment    Vocation Requirements  lifting ~100 lbs      Posture/Postural Control   Posture/Postural Control  Postural limitations    Postural Limitations  Forward head;Rounded Shoulders;Increased thoracic kyphosis      ROM / Strength   AROM / PROM / Strength  AROM;Strength      AROM   Overall AROM Comments  shoulder AROM WFL    AROM Assessment Site  Cervical    Cervical Flexion  55    Cervical Extension  40    Cervical - Right Rotation  65    Cervical - Left Rotation  63       Strength   Overall Strength  Within functional limits for tasks performed    Overall Strength Comments  grip strength: Right 55 lbs of force, Left: 40 lbs of force    Strength Assessment Site  Shoulder    Right/Left Shoulder  Right;Left    Right Shoulder Flexion  4+/5    Right Shoulder ABduction  4+/5    Right Shoulder Internal Rotation  5/5    Right Shoulder External Rotation  5/5    Left Shoulder Flexion  4/5    Left Shoulder ABduction  4+/5    Left Shoulder Internal Rotation  5/5    Left Shoulder External Rotation  5/5      Palpation   Spinal mobility  decreased thoracic spinal PA spinal mobility 2/6    Palpation comment  tenderness to palpation along right cervical paraspinals                Objective measurements completed on examination: See above findings.              PT Education - 06/30/17 2202    Education Details  scapular retractions, chin tucks in sitting and supine    Person(s) Educated  Patient    Methods  Explanation;Demonstration;Handout    Comprehension  Verbalized understanding;Returned demonstration          PT Long Term Goals - 06/30/17 2208      PT LONG TERM GOAL #1   Title  Patient will be independent with HEP    Time  6    Period  Weeks    Status  New      PT LONG TERM GOAL #2   Title  Patient will report no neurological symptoms down both UEs to indicate no nerve irritation.    Time  6    Period  Weeks    Status  New      PT LONG TERM GOAL #3   Title  Patient will demonstrate improved shoulder MMT in all planes to 4+/5 or greater to improve stability during functional activities    Time  6    Period  Weeks    Status  New      PT LONG TERM GOAL #4   Title  Patient will demonstrate equal grip strength to 55  lbs of force or greater.    Time  6    Period  Weeks    Status  New             Plan - 06/30/17 2204    Clinical Impression Statement  Patient is a 57 year old male who presents to physical therapy with  cervical pain with associated numbness and tingling from midback to both UEs to fingers. Patient noted with good cervical AROM with no reports of pain or discomfort with any motion. Patient noted with good strength. Patient reports tenderness to palpation along right cervical paraspinals. Patient denies changes in sensation. Patient may benefit from skilled physical therapy to decrease pain and address patient's goals.     History and Personal Factors relevant to plan of care:  Mitochondrial Myopathic Muscular Dystrophy; DM; Asthma    Clinical Presentation  Evolving    Clinical Presentation due to:  severity of symptoms    Clinical Decision Making  Moderate    Rehab Potential  Good    PT Frequency  2x / week    PT Treatment/Interventions  ADLs/Self Care Home Management;Cryotherapy;Electrical Stimulation;Ultrasound;Traction;Moist Heat;Therapeutic exercise;Patient/family education;Neuromuscular re-education;Manual techniques;Passive range of motion;Dry needling;Taping    PT Next Visit Plan  UBE, chin tucks, cervical AROM, STW/M to cervical spine, attempt manual traction, modalities PRN for pain relief    PT Home Exercise Plan  chin tucks, scapular retractions    Consulted and Agree with Plan of Care  Patient       Patient will benefit from skilled therapeutic intervention in order to improve the following deficits and impairments:  Pain, Postural dysfunction, Decreased activity tolerance, Decreased endurance, Decreased range of motion, Decreased strength  Visit Diagnosis: Cervicalgia  Radiculopathy, cervicothoracic region     Problem List Patient Active Problem List   Diagnosis Date Noted  . Influenza A 01/19/2016  . Flu-like symptoms 01/19/2016  . Acute bronchitis 01/19/2016  . Allergy to meat 06/21/2015  . Essential hypertension 02/05/2015  . Sciatica of left side 10/02/2014  . Diabetes mellitus without complication ([place])   . Right knee meniscal tear 10/26/2013  . GERD  (gastroesophageal reflux disease) 12/23/2012  . Hyperlipidemia with target LDL less than 100 12/23/2012  . T2DM (type 2 diabetes mellitus) ([place]) 12/23/2012  . Fluid retention 12/23/2012  . Asthma, mild intermittent 12/23/2012  . Muscular dystrophy ([place]) 04/29/2012  . Right shoulder pain 03/02/2012   [name], PT, DPT 06/30/2017, 10:11 PM  [facility] [address] [place], [place], [address] Phone: [phone]   Fax:  [phone]  Name: Johnny Mathews MRN: [id number] Date of Birth: [birth year]

## 2017-07-02 ENCOUNTER — Ambulatory Visit: Payer: BLUE CROSS/BLUE SHIELD | Admitting: Physical Therapy

## 2017-07-02 DIAGNOSIS — M5413 Radiculopathy, cervicothoracic region: Secondary | ICD-10-CM

## 2017-07-02 DIAGNOSIS — M542 Cervicalgia: Secondary | ICD-10-CM

## 2017-07-02 NOTE — Therapy (Signed)
[facility] [place], [place], [address] Phone: [phone]   Fax:  [phone]  Physical Therapy Treatment  Patient Details  Name: Johnny Mathews MRN: [id number] Date of Birth: [birth year] Referring Provider: Consuella Lose, MD   Encounter Date: 07/02/2017  PT End of Session - 07/02/17 1650    Visit Number  2    Number of Visits  12    Date for PT Re-Evaluation  08/18/17    Authorization Type  progress note every 10th visit    PT Start Time  1600    PT Stop Time  1650    PT Time Calculation (min)  50 min    Activity Tolerance  Patient tolerated treatment well    Behavior During Therapy  [facility] for tasks assessed/performed       Past Medical History:  Diagnosis Date  . Alpha galactosidase deficiency   . Asthma   . BPH (benign prostatic hypertrophy)   . Diabetes mellitus without complication ([place])   . GERD (gastroesophageal reflux disease)   . Hyperlipidemia   . Muscular dystrophy [facility])     Past Surgical History:  Procedure Laterality Date  . KIDNEY STONE SURGERY    . KNEE SURGERY Right   . SHOULDER OPEN ROTATOR CUFF REPAIR      There were no vitals filed for this visit.  Subjective Assessment - 07/02/17 1813    Subjective  Patient reported" I'm feeling it a little more today."    Pertinent History  Mitochondrial Muscular Dystrophy, DM, Asthma, previous left RTC surgery    Limitations  Lifting;House hold activities    Diagnostic tests  MRI    Patient Stated Goals  decrease pain and return to PLOF    Currently in Pain?  Yes did not provide pain scale "mild"    Pain Location  Neck    Pain Orientation  Right;Left;Posterior    Pain Descriptors / Indicators  Discomfort    Pain Type  Chronic pain    Pain Onset  More than a month ago    Pain Frequency  Intermittent         [facility] PT Assessment - 07/02/17 0001      Assessment   Medical Diagnosis  cervical disc disorder at C5-C6 with radiculopathy    Hand  Dominance  Right    Next MD Visit  n/a    Prior Therapy  no                   [facility] Adult PT Treatment/Exercise - 07/02/17 0001      Exercises   Exercises  Shoulder      Shoulder Exercises: ROM/Strengthening   UBE (Upper Arm Bike)  120 RPM 8 min (4 fwd, 4 bwd)      Modalities   Modalities  Cryotherapy;Electrical Stimulation      Cryotherapy   Number Minutes Cryotherapy  15 Minutes    Cryotherapy Location  Cervical    Type of Cryotherapy  Ice pack      Electrical Stimulation   Electrical Stimulation Location  both Cervical paraspinals and UT    Electrical Stimulation Action  IFC    Electrical Stimulation Parameters  80-150 hz x15 mi    Electrical Stimulation Goals  Tone;Pain      Manual Therapy   Manual Therapy  Soft tissue mobilization    Soft tissue mobilization  STW/M to cervical paraspinals, both UT to decrease pain and decrease tone  PT Long Term Goals - 06/30/17 2208      PT LONG TERM GOAL #1   Title  Patient will be independent with HEP    Time  6    Period  Weeks    Status  New      PT LONG TERM GOAL #2   Title  Patient will report no neurological symptoms down both UEs to indicate no nerve irritation.    Time  6    Period  Weeks    Status  New      PT LONG TERM GOAL #3   Title  Patient will demonstrate improved shoulder MMT in all planes to 4+/5 or greater to improve stability during functional activities    Time  6    Period  Weeks    Status  New      PT LONG TERM GOAL #4   Title  Patient will demonstrate equal grip strength to 55 lbs of force or greater.    Time  6    Period  Weeks    Status  New            Plan - 07/02/17 1814    Clinical Impression Statement  Patient was able to tolerate treatment well with no reports of increased pain or discomfort. Patient noted with increased UT muscle tone left > right during STW/M. Patient reported a decrease in pain after session but did not provide number on  pain scale. Normal response to modalities at end of session.    Clinical Presentation  Evolving    Clinical Decision Making  Moderate    Rehab Potential  Good    PT Frequency  2x / week    PT Duration  6 weeks    PT Treatment/Interventions  ADLs/Self Care Home Management;Cryotherapy;Electrical Stimulation;Ultrasound;Traction;Moist Heat;Therapeutic exercise;Patient/family education;Neuromuscular re-education;Manual techniques;Passive range of motion;Dry needling;Taping    PT Next Visit Plan  UBE, chin tucks, cervical AROM, STW/M to cervical spine, attempt manual traction, modalities PRN for pain relief    Consulted and Agree with Plan of Care  Patient       Patient will benefit from skilled therapeutic intervention in order to improve the following deficits and impairments:  Pain, Postural dysfunction, Decreased activity tolerance, Decreased endurance, Decreased range of motion, Decreased strength  Visit Diagnosis: Cervicalgia  Radiculopathy, cervicothoracic region     Problem List Patient Active Problem List   Diagnosis Date Noted  . Influenza A 01/19/2016  . Flu-like symptoms 01/19/2016  . Acute bronchitis 01/19/2016  . Allergy to meat 06/21/2015  . Essential hypertension 02/05/2015  . Sciatica of left side 10/02/2014  . Diabetes mellitus without complication ([place])   . Right knee meniscal tear 10/26/2013  . GERD (gastroesophageal reflux disease) 12/23/2012  . Hyperlipidemia with target LDL less than 100 12/23/2012  . T2DM (type 2 diabetes mellitus) ([place]) 12/23/2012  . Fluid retention 12/23/2012  . Asthma, mild intermittent 12/23/2012  . Muscular dystrophy ([place]) 04/29/2012  . Right shoulder pain 03/02/2012   [name], PT, DPT 07/02/2017, 6:16 PM  [facility] [address] [place], [place], [address] Phone: [phone]   Fax:  [phone]  Name: HRITHIK BOSCHEE MRN: [id number] Date of Birth: September 10, 1960

## 2017-07-07 ENCOUNTER — Ambulatory Visit: Payer: BLUE CROSS/BLUE SHIELD | Attending: Neurosurgery | Admitting: Physical Therapy

## 2017-07-07 DIAGNOSIS — M542 Cervicalgia: Secondary | ICD-10-CM | POA: Insufficient documentation

## 2017-07-07 DIAGNOSIS — M5413 Radiculopathy, cervicothoracic region: Secondary | ICD-10-CM | POA: Diagnosis not present

## 2017-07-07 NOTE — Therapy (Signed)
[facility] [place], [place], [address] Phone: [phone]   Fax:  [phone]  Physical Therapy Treatment  Patient Details  Name: Johnny Mathews MRN: [id number] Date of Birth: [birth year] Referring Provider: Consuella Lose, MD   Encounter Date: 07/07/2017  PT End of Session - 07/07/17 1649    Visit Number  3    Number of Visits  12    Date for PT Re-Evaluation  08/18/17    Authorization Type  progress note every 10th visit    PT Start Time  1600    PT Stop Time  1656    PT Time Calculation (min)  56 min    Activity Tolerance  Patient tolerated treatment well    Behavior During Therapy  [facility] for tasks assessed/performed       Past Medical History:  Diagnosis Date  . Alpha galactosidase deficiency   . Asthma   . BPH (benign prostatic hypertrophy)   . Diabetes mellitus without complication ([place])   . GERD (gastroesophageal reflux disease)   . Hyperlipidemia   . Muscular dystrophy [facility])     Past Surgical History:  Procedure Laterality Date  . KIDNEY STONE SURGERY    . KNEE SURGERY Right   . SHOULDER OPEN ROTATOR CUFF REPAIR      There were no vitals filed for this visit.  Subjective Assessment - 07/07/17 1607    Subjective  Patient reported he was pleasantly surprised he felt good for two days after first treatment session but reported pain/tiredness of cervical spine has returned and was unsure if it was how he slept.    Pertinent History  Mitochondrial Muscular Dystrophy, DM, Asthma, previous left RTC surgery    Limitations  Lifting;House hold activities    Diagnostic tests  MRI    Patient Stated Goals  decrease pain and return to PLOF    Currently in Pain?  Yes         [facility] PT Assessment - 07/07/17 0001      Assessment   Medical Diagnosis  cervical disc disorder at C5-C6 with radiculopathy    Hand Dominance  Right    Next MD Visit  n/a    Prior Therapy  no                   [facility]  Adult PT Treatment/Exercise - 07/07/17 0001      Exercises   Exercises  Shoulder      Shoulder Exercises: ROM/Strengthening   UBE (Upper Arm Bike)  120 RPM 8 min (4 fwd, 4 bwd)      Modalities   Modalities  Cryotherapy;Electrical Stimulation      Cryotherapy   Number Minutes Cryotherapy  15 Minutes    Cryotherapy Location  Cervical    Type of Cryotherapy  Ice pack      Electrical Stimulation   Electrical Stimulation Location  both Cervical paraspinals and UT    Electrical Stimulation Action  IFC    Electrical Stimulation Parameters  80-150 hz x15 min    Electrical Stimulation Goals  Tone;Pain      Manual Therapy   Manual Therapy  Soft tissue mobilization    Soft tissue mobilization  STW/M to cervical paraspinals, both UT to decrease pain and decrease tone                  PT Long Term Goals - 06/30/17 2208      PT LONG TERM GOAL #1  Title  Patient will be independent with HEP    Time  6    Period  Weeks    Status  New      PT LONG TERM GOAL #2   Title  Patient will report no neurological symptoms down both UEs to indicate no nerve irritation.    Time  6    Period  Weeks    Status  New      PT LONG TERM GOAL #3   Title  Patient will demonstrate improved shoulder MMT in all planes to 4+/5 or greater to improve stability during functional activities    Time  6    Period  Weeks    Status  New      PT LONG TERM GOAL #4   Title  Patient will demonstrate equal grip strength to 55 lbs of force or greater.    Time  6    Period  Weeks    Status  New            Plan - 07/07/17 1650    Clinical Impression Statement  Patient was able to tolerate treatment well with no reports of pain or discomfort. Patient continues to have increased UT tone L>R. Patient educated importance of performing HEP to tolerance to start to build up cervical musculature as well as postural muscles. Normal response to modalities upon removal.     Clinical Presentation  Evolving     Clinical Decision Making  Moderate    Rehab Potential  Good    PT Frequency  2x / week    PT Duration  6 weeks    PT Treatment/Interventions  ADLs/Self Care Home Management;Cryotherapy;Electrical Stimulation;Ultrasound;Traction;Moist Heat;Therapeutic exercise;Patient/family education;Neuromuscular re-education;Manual techniques;Passive range of motion;Dry needling;Taping    PT Next Visit Plan  UBE, chin tucks, cervical AROM, STW/M to cervical spine, attempt manual traction, modalities PRN for pain relief    Consulted and Agree with Plan of Care  Patient       Patient will benefit from skilled therapeutic intervention in order to improve the following deficits and impairments:  Pain, Postural dysfunction, Decreased activity tolerance, Decreased endurance, Decreased range of motion, Decreased strength  Visit Diagnosis: Cervicalgia  Radiculopathy, cervicothoracic region     Problem List Patient Active Problem List   Diagnosis Date Noted  . Influenza A 01/19/2016  . Flu-like symptoms 01/19/2016  . Acute bronchitis 01/19/2016  . Allergy to meat 06/21/2015  . Essential hypertension 02/05/2015  . Sciatica of left side 10/02/2014  . Diabetes mellitus without complication ([place])   . Right knee meniscal tear 10/26/2013  . GERD (gastroesophageal reflux disease) 12/23/2012  . Hyperlipidemia with target LDL less than 100 12/23/2012  . T2DM (type 2 diabetes mellitus) ([place]) 12/23/2012  . Fluid retention 12/23/2012  . Asthma, mild intermittent 12/23/2012  . Muscular dystrophy ([place]) 04/29/2012  . Right shoulder pain 03/02/2012   [name], PT, DPT 07/07/2017, 5:47 PM  [facility] [address] [place], [place], [address] Phone: [phone]   Fax:  [phone]  Name: Johnny Mathews MRN: [id number] Date of Birth: 1960-06-30

## 2017-07-14 ENCOUNTER — Ambulatory Visit: Payer: BLUE CROSS/BLUE SHIELD | Admitting: Physical Therapy

## 2017-07-14 ENCOUNTER — Encounter: Payer: Self-pay | Admitting: Physical Therapy

## 2017-07-14 DIAGNOSIS — M542 Cervicalgia: Secondary | ICD-10-CM | POA: Diagnosis not present

## 2017-07-14 DIAGNOSIS — M5413 Radiculopathy, cervicothoracic region: Secondary | ICD-10-CM

## 2017-07-14 NOTE — Therapy (Addendum)
[facility] [place], [place], [address] Phone: [phone]   Fax:  [phone]  Physical Therapy Treatment  Patient Details  Name: Johnny Mathews MRN: [id number] Date of Birth: 1960-09-22 Referring Provider: Consuella Lose, MD   Encounter Date: 07/14/2017  PT End of Session - 07/14/17 1659    Visit Number  3    Number of Visits  12    Date for PT Re-Evaluation  08/18/17    Authorization Type  progress note every 10th visit    PT Start Time  1600    PT Stop Time  1657    PT Time Calculation (min)  57 min    Activity Tolerance  Patient tolerated treatment well    Behavior During Therapy  [facility] for tasks assessed/performed       Past Medical History:  Diagnosis Date  . Alpha galactosidase deficiency   . Asthma   . BPH (benign prostatic hypertrophy)   . Diabetes mellitus without complication ([place])   . GERD (gastroesophageal reflux disease)   . Hyperlipidemia   . Muscular dystrophy [facility])     Past Surgical History:  Procedure Laterality Date  . KIDNEY STONE SURGERY    . KNEE SURGERY Right   . SHOULDER OPEN ROTATOR CUFF REPAIR      There were no vitals filed for this visit.  Subjective Assessment - 07/14/17 1701    Subjective  Patient reported pain comes and goes and states it reports it feels like the muscles can't hold up his neck.    Pertinent History  Mitochondrial Muscular Dystrophy, DM, Asthma, previous left RTC surgery    Limitations  Lifting;House hold activities    Diagnostic tests  MRI    Patient Stated Goals  decrease pain and return to PLOF    Currently in Pain?  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-C6 with radiculopathy    Hand Dominance  Right    Next MD Visit  n/a    Prior Therapy  no                   [facility] Adult PT Treatment/Exercise - 07/14/17 0001      Exercises   Exercises  Shoulder      Shoulder Exercises: Standing   Extension  Strengthening;20 reps;10 reps    Theraband Level (Shoulder Extension)  Other (comment)    Extension Limitations  Pink XTS    Row  Strengthening;Both;20 reps;10 reps    Theraband Level (Shoulder Row)  Other (comment)    Row Limitations  Pink XTS      Shoulder Exercises: ROM/Strengthening   UBE (Upper Arm Bike)  120 RPM 8 min (4 fwd, 4 bwd)      Modalities   Modalities  Cryotherapy;Electrical Stimulation;Ultrasound      Cryotherapy   Number Minutes Cryotherapy  15 Minutes    Cryotherapy Location  Cervical    Type of Cryotherapy  Ice pack      Theme park manager  both Cervical paraspinals and UT    Electrical Stimulation Action  IFC    Electrical Stimulation Parameters  80-150 hz x15 min    Electrical Stimulation Goals  Tone;Pain        Ultrasound to bilateral UT 1.5 w/cm2, 100%, 3 mhz x12 minutes to reduce pain           PT Long Term Goals - 06/30/17 2208      PT LONG TERM GOAL #1   Title  Patient will be independent with HEP    Time  6    Period  Weeks    Status  New      PT LONG TERM GOAL #2   Title  Patient will report no neurological symptoms down both UEs to indicate no nerve irritation.    Time  6    Period  Weeks    Status  New      PT LONG TERM GOAL #3   Title  Patient will demonstrate improved shoulder MMT in all planes to 4+/5 or greater to improve stability during functional activities    Time  6    Period  Weeks    Status  New      PT LONG TERM GOAL #4   Title  Patient will demonstrate equal grip strength to 55 lbs of force or greater.    Time  6    Period  Weeks    Status  New            Plan - 07/14/17 1703    Clinical Impression  Statement  Patient was able to tolerate treatment well. Patient demonstrated good form with exercises after demonstration. Patient reported no complaints with ultrasound. Normal response to modalities upon removal.     Clinical Presentation  Evolving    Clinical Decision Making  Moderate    Rehab Potential  Good    PT Frequency  2x / week    PT Duration  6 weeks    PT Treatment/Interventions  ADLs/Self Care Home Management;Cryotherapy;Electrical Stimulation;Ultrasound;Traction;Moist Heat;Therapeutic exercise;Patient/family education;Neuromuscular re-education;Manual techniques;Passive range of motion;Dry needling;Taping    PT Next Visit Plan  UBE, chin tucks, cervical AROM, STW/M to cervical spine, attempt manual traction, modalities PRN for pain relief    Consulted and Agree with Plan of Care  Patient       Patient will benefit from skilled therapeutic intervention in order to improve the following deficits and impairments:  Pain, Postural dysfunction, Decreased activity tolerance, Decreased endurance, Decreased range of motion, Decreased strength  Visit Diagnosis: Cervicalgia  Radiculopathy, cervicothoracic region     Problem List Patient Active Problem List   Diagnosis Date Noted  . Influenza A 01/19/2016  . Flu-like symptoms 01/19/2016  . Acute bronchitis 01/19/2016  . Allergy to meat 06/21/2015  . Essential hypertension 02/05/2015  . Sciatica of left side 10/02/2014  . Diabetes mellitus without complication ([place])   . Right knee meniscal tear 10/26/2013  . GERD (gastroesophageal reflux disease) 12/23/2012  . Hyperlipidemia with target LDL less than 100 12/23/2012  . T2DM (type 2 diabetes mellitus) ([place]) 12/23/2012  . Fluid retention 12/23/2012  . Asthma, mild intermittent 12/23/2012  . Muscular dystrophy ([place]) 04/29/2012  . Right shoulder pain 03/02/2012    [name], PT, DPT 07/14/2017, 6:08 PM  [facility] [address] [place], [place], [address] Phone: [phone]   Fax:  [phone]  Name: Johnny Mathews MRN: [id number] Date of Birth: 13-Mar-1960

## 2017-07-16 ENCOUNTER — Ambulatory Visit: Payer: BLUE CROSS/BLUE SHIELD | Admitting: Physical Therapy

## 2017-07-16 ENCOUNTER — Encounter: Payer: Self-pay | Admitting: Physical Therapy

## 2017-07-16 DIAGNOSIS — M542 Cervicalgia: Secondary | ICD-10-CM | POA: Diagnosis not present

## 2017-07-16 DIAGNOSIS — M5413 Radiculopathy, cervicothoracic region: Secondary | ICD-10-CM

## 2017-07-16 NOTE — Therapy (Signed)
[facility] [place], [place], [address] Phone: [phone]   Fax:  [phone]  Physical Therapy Treatment  Patient Details  Name: Johnny Mathews MRN: [id number] Date of Birth: 1960-08-15 Referring Provider: Consuella Lose, MD   Encounter Date: 07/16/2017  PT End of Session - 07/16/17 1751    Visit Number  4    Number of Visits  12    Date for PT Re-Evaluation  08/18/17    Authorization Type  progress note every 10th visit    PT Start Time  1600    PT Stop Time  1653    PT Time Calculation (min)  53 min    Activity Tolerance  Patient tolerated treatment well    Behavior During Therapy  [facility] for tasks assessed/performed       Past Medical History:  Diagnosis Date  . Alpha galactosidase deficiency   . Asthma   . BPH (benign prostatic hypertrophy)   . Diabetes mellitus without complication ([place])   . GERD (gastroesophageal reflux disease)   . Hyperlipidemia   . Muscular dystrophy [facility])     Past Surgical History:  Procedure Laterality Date  . KIDNEY STONE SURGERY    . KNEE SURGERY Right   . SHOULDER OPEN ROTATOR CUFF REPAIR      There were no vitals filed for this visit.  Subjective Assessment - 07/16/17 1748    Subjective  Patient reports left side of neck feels fine with no pain; reports right has some discomfort but no pain.    Pertinent History  Mitochondrial Muscular Dystrophy, DM, Asthma, previous left RTC surgery    Limitations  Lifting;House hold activities    Diagnostic tests  MRI    Patient Stated Goals  decrease pain and return to PLOF    Pain Score  2     Pain Location  Neck    Pain Orientation  Right    Pain Descriptors / Indicators  Discomfort    Pain Type  Chronic pain    Pain Onset  More than a month ago    Pain Frequency  Intermittent         [facility] PT Assessment - 07/16/17 0001      Assessment   Medical Diagnosis  cervical disc disorder at C5-C6 with radiculopathy    Hand  Dominance  Right    Next MD Visit  n/a    Prior Therapy  no                   [facility] Adult PT Treatment/Exercise - 07/16/17 0001      Exercises   Exercises  Shoulder      Shoulder Exercises: Standing   Extension  Strengthening;20 reps;10 reps    Theraband Level (Shoulder Extension)  Other (comment)    Extension Limitations  Pink XTS    Row  Strengthening;Both;20 reps;10 reps    Theraband Level (Shoulder Row)  Other (comment)    Row Limitations  Pink XTS      Shoulder Exercises: ROM/Strengthening   UBE (Upper Arm Bike)  120 RPM 35min (5 fwd, 5 bwd)      Modalities   Modalities  Cryotherapy;Electrical Stimulation;Ultrasound      Cryotherapy   Number Minutes Cryotherapy  15 Minutes    Cryotherapy Location  Cervical    Type of Cryotherapy  Ice pack      Electrical Stimulation   Electrical Stimulation Location  both Cervical paraspinals and UT    Electrical  Stimulation Action  IFC    Electrical Stimulation Parameters  80-150 hz x15    Electrical Stimulation Goals  Tone;Pain      Ultrasound   Ultrasound Location  bilateral UT    Ultrasound Parameters  1.5 w/cm2 100%, 84mhz,    Ultrasound Goals  Pain                  PT Long Term Goals - 06/30/17 2208      PT LONG TERM GOAL #1   Title  Patient will be independent with HEP    Time  6    Period  Weeks    Status  New      PT LONG TERM GOAL #2   Title  Patient will report no neurological symptoms down both UEs to indicate no nerve irritation.    Time  6    Period  Weeks    Status  New      PT LONG TERM GOAL #3   Title  Patient will demonstrate improved shoulder MMT in all planes to 4+/5 or greater to improve stability during functional activities    Time  6    Period  Weeks    Status  New      PT LONG TERM GOAL #4   Title  Patient will demonstrate equal grip strength to 55 lbs of force or greater.    Time  6    Period  Weeks    Status  New            Plan - 07/16/17 1753     Clinical Impression Statement  Patient was able to tolerate treatment well with good form with exercises. Patient noted with a decrease with after session. Normal response to modalities upon removal.    Clinical Presentation  Evolving    Clinical Decision Making  Moderate    Rehab Potential  Good    PT Frequency  2x / week    PT Duration  6 weeks    PT Treatment/Interventions  ADLs/Self Care Home Management;Cryotherapy;Electrical Stimulation;Ultrasound;Traction;Moist Heat;Therapeutic exercise;Patient/family education;Neuromuscular re-education;Manual techniques;Passive range of motion;Dry needling;Taping    PT Next Visit Plan  UBE, chin tucks, cervical AROM, STW/M to cervical spine, attempt manual traction, modalities PRN for pain relief    Consulted and Agree with Plan of Care  Patient       Patient will benefit from skilled therapeutic intervention in order to improve the following deficits and impairments:  Pain, Postural dysfunction, Decreased activity tolerance, Decreased endurance, Decreased range of motion, Decreased strength  Visit Diagnosis: Cervicalgia  Radiculopathy, cervicothoracic region     Problem List Patient Active Problem List   Diagnosis Date Noted  . Influenza A 01/19/2016  . Flu-like symptoms 01/19/2016  . Acute bronchitis 01/19/2016  . Allergy to meat 06/21/2015  . Essential hypertension 02/05/2015  . Sciatica of left side 10/02/2014  . Diabetes mellitus without complication ([place])   . Right knee meniscal tear 10/26/2013  . GERD (gastroesophageal reflux disease) 12/23/2012  . Hyperlipidemia with target LDL less than 100 12/23/2012  . T2DM (type 2 diabetes mellitus) ([place]) 12/23/2012  . Fluid retention 12/23/2012  . Asthma, mild intermittent 12/23/2012  . Muscular dystrophy ([place]) 04/29/2012  . Right shoulder pain 03/02/2012   Johnny Mathews, PT, DPT 07/16/2017, 5:58 PM  [facility] [address] [place], [place], [address] Phone: [phone]   Fax:  [phone]  Name: Johnny Mathews MRN: [id number] Date of Birth: March 05, 1960

## 2017-07-21 ENCOUNTER — Ambulatory Visit: Payer: BLUE CROSS/BLUE SHIELD | Admitting: Physical Therapy

## 2017-07-21 ENCOUNTER — Encounter: Payer: Self-pay | Admitting: Physical Therapy

## 2017-07-21 DIAGNOSIS — M5413 Radiculopathy, cervicothoracic region: Secondary | ICD-10-CM | POA: Diagnosis not present

## 2017-07-21 DIAGNOSIS — M542 Cervicalgia: Secondary | ICD-10-CM | POA: Diagnosis not present

## 2017-07-21 NOTE — Therapy (Signed)
[facility] [place], [place], [address] Phone: [phone]   Fax:  [phone]  Physical Therapy Treatment  Patient Details  Name: Johnny Mathews MRN: [id number] Date of Birth: 15-Nov-1960 Referring Provider: Consuella Lose, MD   Encounter Date: 07/21/2017  PT End of Session - 07/21/17 1600    Visit Number  5    Number of Visits  12    Date for PT Re-Evaluation  08/18/17    Authorization Type  progress note every 10th visit    PT Start Time  1600    PT Stop Time  1651    PT Time Calculation (min)  51 min    Activity Tolerance  Patient tolerated treatment well    Behavior During Therapy  [facility] for tasks assessed/performed       Past Medical History:  Diagnosis Date  . Alpha galactosidase deficiency   . Asthma   . BPH (benign prostatic hypertrophy)   . Diabetes mellitus without complication ([place])   . GERD (gastroesophageal reflux disease)   . Hyperlipidemia   . Muscular dystrophy [facility])     Past Surgical History:  Procedure Laterality Date  . KIDNEY STONE SURGERY    . KNEE SURGERY Right   . SHOULDER OPEN ROTATOR CUFF REPAIR      There were no vitals filed for this visit.  Subjective Assessment - 07/21/17 1559    Subjective  Reports that his back was good over the weekend but has had more pain since returning to work.    Pertinent History  Mitochondrial Muscular Dystrophy, DM, Asthma, previous left RTC surgery    Limitations  Lifting;House hold activities    Diagnostic tests  MRI    Patient Stated Goals  decrease pain and return to PLOF    Currently in Pain?  Yes    Pain Score  2     Pain Location  Neck    Pain Orientation  Right;Left    Pain Descriptors / Indicators  Constant;Discomfort    Pain Type  Chronic pain    Pain Onset  More than a month ago    Pain Frequency  Constant         [facility] PT Assessment - 07/21/17 0001      Assessment   Medical Diagnosis  cervical disc disorder at C5-C6 with  radiculopathy    Hand Dominance  Right    Next MD Visit  n/a    Prior Therapy  no                   [facility] Adult PT Treatment/Exercise - 07/21/17 0001      Shoulder Exercises: Standing   Extension  Strengthening;20 reps;10 reps    Theraband Level (Shoulder Extension)  Other (comment)    Extension Limitations  Pink XTS    Row  Strengthening;Both;20 reps;10 reps    Theraband Level (Shoulder Row)  Other (comment)    Row Limitations  Pink XTS    Diagonals  Strengthening;Both;10 reps;Theraband    Theraband Level (Shoulder Diagonals)  Level 2 (Red)    Other Standing Exercises  Wall walks 10' red theraband x10 reps      Shoulder Exercises: ROM/Strengthening   UBE (Upper Arm Bike)  90 RPM x10 min      Modalities   Modalities  Electrical Stimulation;Moist Heat      Moist Heat Therapy   Number Minutes Moist Heat  15 Minutes    Moist Heat Location  Cervical  Theme park manager  B UT, mid Press photographer  IFC    Electrical Stimulation Parameters  80-150 hz x15 min    Electrical Stimulation Goals  Tone;Pain      Manual Therapy   Manual Therapy  Soft tissue mobilization    Soft tissue mobilization  STW to B UT, cervical paraspinals, mid trap to reduce pain and tightness                  PT Long Term Goals - 06/30/17 2208      PT LONG TERM GOAL #1   Title  Patient will be independent with HEP    Time  6    Period  Weeks    Status  New      PT LONG TERM GOAL #2   Title  Patient will report no neurological symptoms down both UEs to indicate no nerve irritation.    Time  6    Period  Weeks    Status  New      PT LONG TERM GOAL #3   Title  Patient will demonstrate improved shoulder MMT in all planes to 4+/5 or greater to improve stability during functional activities    Time  6    Period  Weeks    Status  New      PT LONG TERM GOAL #4   Title  Patient will demonstrate equal grip strength  to 55 lbs of force or greater.    Time  6    Period  Weeks    Status  New            Plan - 07/21/17 1637    Clinical Impression Statement  Patient tolerated today's treatment well although he reported increased cervical or upper back pain. Patient guided through postural strengthening exercises with verbal and tactile cueing for proper posture especially in cervicothoracic spine. Increased muscle tightness palpated in B UT and mid trap region today. Normal modalities response noted following removal of the modalities.    Rehab Potential  Good    PT Frequency  2x / week    PT Duration  6 weeks    PT Treatment/Interventions  ADLs/Self Care Home Management;Cryotherapy;Electrical Stimulation;Ultrasound;Traction;Moist Heat;Therapeutic exercise;Patient/family education;Neuromuscular re-education;Manual techniques;Passive range of motion;Dry needling;Taping    PT Next Visit Plan  UBE, chin tucks, cervical AROM, STW/M to cervical spine, attempt manual traction, modalities PRN for pain relief    PT Home Exercise Plan  chin tucks, scapular retractions    Consulted and Agree with Plan of Care  Patient       Patient will benefit from skilled therapeutic intervention in order to improve the following deficits and impairments:  Pain, Postural dysfunction, Decreased activity tolerance, Decreased endurance, Decreased range of motion, Decreased strength  Visit Diagnosis: Cervicalgia  Radiculopathy, cervicothoracic region     Problem List Patient Active Problem List   Diagnosis Date Noted  . Influenza A 01/19/2016  . Flu-like symptoms 01/19/2016  . Acute bronchitis 01/19/2016  . Allergy to meat 06/21/2015  . Essential hypertension 02/05/2015  . Sciatica of left side 10/02/2014  . Diabetes mellitus without complication ([place])   . Right knee meniscal tear 10/26/2013  . GERD (gastroesophageal reflux disease) 12/23/2012  . Hyperlipidemia with target LDL less than 100 12/23/2012  . T2DM (type  2 diabetes mellitus) ([place]) 12/23/2012  . Fluid retention 12/23/2012  . Asthma, mild intermittent 12/23/2012  . Muscular dystrophy ([place]) 04/29/2012  .  Right shoulder pain 03/02/2012    Standley Brooking, PTA 07/21/2017, 5:00 PM  [facility] [place], [place], [address] Phone: [phone]   Fax:  [phone]  Name: Johnny Mathews MRN: [id number] Date of Birth: 09-05-1960

## 2017-07-23 ENCOUNTER — Encounter: Payer: Self-pay | Admitting: Physical Therapy

## 2017-07-23 ENCOUNTER — Ambulatory Visit: Payer: BLUE CROSS/BLUE SHIELD | Admitting: Physical Therapy

## 2017-07-23 DIAGNOSIS — M5413 Radiculopathy, cervicothoracic region: Secondary | ICD-10-CM

## 2017-07-23 DIAGNOSIS — M542 Cervicalgia: Secondary | ICD-10-CM | POA: Diagnosis not present

## 2017-07-23 NOTE — Therapy (Signed)
[facility] [place], [place], [address] Phone: [phone]   Fax:  [phone]  Physical Therapy Treatment  Patient Details  Name: Johnny Mathews MRN: [id number] Date of Birth: 1960/05/05 Referring Provider: Consuella Lose, MD   Encounter Date: 07/23/2017  PT End of Session - 07/23/17 1654    Visit Number  6    Number of Visits  12    Date for PT Re-Evaluation  08/18/17    Authorization Type  progress note every 10th visit    PT Start Time  1608    PT Stop Time  1658    PT Time Calculation (min)  50 min    Activity Tolerance  Patient tolerated treatment well    Behavior During Therapy  [facility] for tasks assessed/performed       Past Medical History:  Diagnosis Date  . Alpha galactosidase deficiency   . Asthma   . BPH (benign prostatic hypertrophy)   . Diabetes mellitus without complication ([place])   . GERD (gastroesophageal reflux disease)   . Hyperlipidemia   . Muscular dystrophy [facility])     Past Surgical History:  Procedure Laterality Date  . KIDNEY STONE SURGERY    . KNEE SURGERY Right   . SHOULDER OPEN ROTATOR CUFF REPAIR      There were no vitals filed for this visit.  Subjective Assessment - 07/23/17 1619    Subjective  Reports that yesterday he walked around a lot and had one instance of numbness but has not had it since. Reports that the numbness only comes to medial wrist.    Pertinent History  Mitochondrial Muscular Dystrophy, DM, Asthma, previous left RTC surgery    Limitations  Lifting;House hold activities    Diagnostic tests  MRI    Patient Stated Goals  decrease pain and return to PLOF    Currently in Pain?  Other (Comment) No pain assessment provided by patient         [facility] PT Assessment - 07/23/17 0001      ROM / Strength   AROM / PROM / Strength  Strength      Strength   Overall Strength  Within functional limits for tasks performed    Strength Assessment Site  Hand    Right/Left  hand  Right;Left    Right Hand Grip (lbs)  75    Left Hand Grip (lbs)  65                   [facility] Adult PT Treatment/Exercise - 07/23/17 0001      Shoulder Exercises: Standing   Extension  Strengthening;20 reps;10 reps    Theraband Level (Shoulder Extension)  Other (comment)    Extension Limitations  Pink XTS    Row  Strengthening;Both;20 reps;10 reps    Theraband Level (Shoulder Row)  Other (comment)    Row Limitations  Pink XTS    Diagonals  Strengthening;Both;20 reps;Theraband    Theraband Level (Shoulder Diagonals)  Level 2 (Red)    Other Standing Exercises  Wall walks 10' red theraband x10 reps      Shoulder Exercises: ROM/Strengthening   UBE (Upper Arm Bike)  90 RPM x10 min    Wall Pushups  20 reps    X to V Arms  x20 reps      Modalities   Modalities  Electrical Stimulation;Moist Heat      Moist Heat Therapy   Number Minutes Moist Heat  15 Minutes  Moist Heat Location  Cervical      Electrical Stimulation   Electrical Stimulation Location  B cervical paraspinals, UT    Electrical Stimulation Action  IFC    Electrical Stimulation Parameters  80-150 hz x15 min    Electrical Stimulation Goals  Tone;Pain             PT Education - 07/23/17 1653    Education Details  HEP- wall pushups, corner stretch, horizontal abduction with red theraband    Person(s) Educated  Patient    Methods  Explanation;Handout    Comprehension  Verbalized understanding          PT Long Term Goals - 07/23/17 1714      PT LONG TERM GOAL #1   Title  Patient will be independent with HEP    Time  6    Period  Weeks    Status  On-going      PT LONG TERM GOAL #2   Title  Patient will report no neurological symptoms down both UEs to indicate no nerve irritation.    Time  6    Period  Weeks    Status  On-going      PT LONG TERM GOAL #3   Title  Patient will demonstrate improved shoulder MMT in all planes to 4+/5 or greater to improve stability during functional  activities    Time  6    Period  Weeks    Status  On-going      PT LONG TERM GOAL #4   Title  Patient will demonstrate equal grip strength to 55 lbs of force or greater.    Time  6    Period  Weeks    Status  Achieved            Plan - 07/23/17 1703    Clinical Impression Statement  Patient tolerated today's treatment well with no complaints of any greater pain with exercises. Patient guided through more cervical and postural strengthening exercises with VCs to maintain neutral cervical spine. Patient provided HEP for postural strengthening and corner stretch as patient felt relief with corner stretch. Patient's grip also improving per objective measurements. Normal modalities response noted following removal of the modalities.    Rehab Potential  Good    PT Frequency  2x / week    PT Duration  6 weeks    PT Treatment/Interventions  ADLs/Self Care Home Management;Cryotherapy;Electrical Stimulation;Ultrasound;Traction;Moist Heat;Therapeutic exercise;Patient/family education;Neuromuscular re-education;Manual techniques;Passive range of motion;Dry needling;Taping    PT Next Visit Plan  UBE, chin tucks, cervical AROM, STW/M to cervical spine, attempt manual traction, modalities PRN for pain relief    PT Home Exercise Plan  chin tucks, scapular retractions; corner stretch, horizontal abduction, wall pushup    Consulted and Agree with Plan of Care  Patient       Patient will benefit from skilled therapeutic intervention in order to improve the following deficits and impairments:  Pain, Postural dysfunction, Decreased activity tolerance, Decreased endurance, Decreased range of motion, Decreased strength  Visit Diagnosis: Cervicalgia  Radiculopathy, cervicothoracic region     Problem List Patient Active Problem List   Diagnosis Date Noted  . Influenza A 01/19/2016  . Flu-like symptoms 01/19/2016  . Acute bronchitis 01/19/2016  . Allergy to meat 06/21/2015  . Essential  hypertension 02/05/2015  . Sciatica of left side 10/02/2014  . Diabetes mellitus without complication ([place])   . Right knee meniscal tear 10/26/2013  . GERD (gastroesophageal reflux disease) 12/23/2012  .  Hyperlipidemia with target LDL less than 100 12/23/2012  . T2DM (type 2 diabetes mellitus) ([place]) 12/23/2012  . Fluid retention 12/23/2012  . Asthma, mild intermittent 12/23/2012  . Muscular dystrophy ([place]) 04/29/2012  . Right shoulder pain 03/02/2012    Standley Brooking, PTA 07/23/2017, 5:14 PM  [facility] [place], [place], [address] Phone: [phone]   Fax:  [phone]  Name: Johnny Mathews MRN: [id number] Date of Birth: 20-Dec-1960

## 2017-07-25 ENCOUNTER — Other Ambulatory Visit: Payer: Self-pay | Admitting: Family Medicine

## 2017-07-28 ENCOUNTER — Ambulatory Visit: Payer: BLUE CROSS/BLUE SHIELD | Admitting: Physical Therapy

## 2017-07-28 ENCOUNTER — Encounter: Payer: Self-pay | Admitting: Physical Therapy

## 2017-07-28 DIAGNOSIS — M5413 Radiculopathy, cervicothoracic region: Secondary | ICD-10-CM

## 2017-07-28 DIAGNOSIS — M542 Cervicalgia: Secondary | ICD-10-CM | POA: Diagnosis not present

## 2017-07-28 NOTE — Therapy (Signed)
[facility] [place], [place], [address] Phone: [phone]   Fax:  [phone]  Physical Therapy Treatment  Patient Details  Name: Johnny Mathews MRN: [id number] Date of Birth: Aug 14, 1960 Referring Provider: Consuella Lose, MD   Encounter Date: 07/28/2017  PT End of Session - 07/28/17 1600    Visit Number  7    Number of Visits  12    Date for PT Re-Evaluation  08/18/17    Authorization Type  progress note every 10th visit    PT Start Time  9381    PT Stop Time  1645    PT Time Calculation (min)  48 min    Activity Tolerance  Patient tolerated treatment well    Behavior During Therapy  [facility] for tasks assessed/performed       Past Medical History:  Diagnosis Date  . Alpha galactosidase deficiency   . Asthma   . BPH (benign prostatic hypertrophy)   . Diabetes mellitus without complication ([place])   . GERD (gastroesophageal reflux disease)   . Hyperlipidemia   . Muscular dystrophy [facility])     Past Surgical History:  Procedure Laterality Date  . KIDNEY STONE SURGERY    . KNEE SURGERY Right   . SHOULDER OPEN ROTATOR CUFF REPAIR      There were no vitals filed for this visit.  Subjective Assessment - 07/28/17 1600    Subjective  Reports that he doesn't know if its the rainy weather or the unloading work he had to do today to irritate his neck. Reports that he got a new office chair that will not go down low and has to look down.    Pertinent History  Mitochondrial Muscular Dystrophy, DM, Asthma, previous left RTC surgery    Limitations  Lifting;House hold activities    Diagnostic tests  MRI    Patient Stated Goals  decrease pain and return to PLOF    Currently in Pain?  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    Hand Dominance  Right    Next MD Visit  n/a    Prior Therapy  no      Precautions   Precautions  None                   [facility] Adult PT Treatment/Exercise - 07/28/17 0001      Shoulder Exercises: Standing   Extension  Strengthening;Limitations x30 reps    Extension Limitations  Pink XTS    Row  Strengthening;Both;Other (comment) X30 reps    Row Limitations  Pink XTS    Diagonals  Strengthening;Both;20 reps;Theraband    Theraband Level (Shoulder Diagonals)  Level 3 (Green)    Other Standing Exercises  Wall walks 10' green theraband x20 reps    Other Standing Exercises  High rows pink XTS x20 reps      Shoulder Exercises: ROM/Strengthening   UBE (Upper Arm Bike)  60 RPM x10 min    Wall Pushups  20 reps      Modalities   Modalities  Electrical Stimulation;Moist Heat      Moist Heat Therapy   Number Minutes Moist Heat  15 Minutes    Moist Heat Location  Cervical      Electrical Stimulation   Electrical Stimulation Location  B cervical paraspinals, UT    Electrical Stimulation Action  IFC    Electrical Stimulation Parameters  80-150 hz x15 min    Electrical Stimulation Goals  Tone;Pain                  PT Long Term Goals - 07/23/17 1714      PT LONG TERM GOAL #1   Title  Patient will be independent with HEP    Time  6    Period  Weeks    Status  On-going      PT LONG TERM GOAL #2   Title  Patient will report no neurological symptoms down both UEs to indicate no nerve irritation.    Time  6    Period  Weeks    Status  On-going      PT LONG TERM GOAL #3   Title  Patient will demonstrate improved shoulder MMT in all planes to 4+/5 or greater to improve stability during functional activities    Time  6    Period  Weeks    Status  On-going      PT LONG TERM GOAL #4   Title  Patient will demonstrate equal grip strength to 55 lbs of force or greater.    Time   6    Period  Weeks    Status  Achieved            Plan - 07/28/17 1635    Clinical Impression Statement  Patient tolerated today's treatment well with no complaints of any increased pain with exercises. Patient arrived with reports of discomfort which he related to weather and/or loading/unlaoding trucks throughout the day. Patient guided through exercises with increased resistance today and VCs to maintain proper cervical posture and erect cervical spine. No complaints of any worsening symptoms upon end of treatment. Normal modalities response noted following removal of the modalities.    Rehab Potential  Good    PT Frequency  2x / week    PT Duration  6 weeks    PT Treatment/Interventions  ADLs/Self Care Home Management;Cryotherapy;Electrical Stimulation;Ultrasound;Traction;Moist Heat;Therapeutic exercise;Patient/family education;Neuromuscular re-education;Manual techniques;Passive range of motion;Dry needling;Taping    PT Next Visit Plan  UBE, chin tucks, cervical AROM, STW/M to cervical spine, attempt manual traction, modalities PRN for pain relief    PT Home Exercise Plan  chin tucks, scapular retractions; corner stretch, horizontal abduction, wall pushup    Consulted and Agree with Plan of Care  Patient       Patient will benefit from skilled therapeutic intervention in order to improve the following deficits and impairments:  Pain, Postural dysfunction, Decreased activity tolerance, Decreased endurance, Decreased range of motion, Decreased strength  Visit Diagnosis: Cervicalgia  Radiculopathy, cervicothoracic region     Problem List Patient Active Problem List   Diagnosis Date Noted  . Influenza A 01/19/2016  . Flu-like symptoms 01/19/2016  . Acute bronchitis 01/19/2016  . Allergy to meat 06/21/2015  . Essential hypertension 02/05/2015  . Sciatica of left side 10/02/2014  . Diabetes mellitus without complication ([place])   . Right knee meniscal tear 10/26/2013  . GERD  (gastroesophageal reflux disease) 12/23/2012  . Hyperlipidemia with target LDL less than 100 12/23/2012  . T2DM (type 2 diabetes mellitus) (  [place]) 12/23/2012  . Fluid retention 12/23/2012  . Asthma, mild intermittent 12/23/2012  . Muscular dystrophy ([place]) 04/29/2012  . Right shoulder pain 03/02/2012    Standley Brooking, PTA 07/28/2017, 5:03 PM  [facility] [address] [place], [place], [address] Phone: [phone]   Fax:  [phone]  Name: Johnny Mathews MRN: [id number] Date of Birth: May 18, 1960

## 2017-07-30 ENCOUNTER — Ambulatory Visit: Payer: BLUE CROSS/BLUE SHIELD | Admitting: Physical Therapy

## 2017-07-30 DIAGNOSIS — M542 Cervicalgia: Secondary | ICD-10-CM | POA: Diagnosis not present

## 2017-07-30 DIAGNOSIS — M5413 Radiculopathy, cervicothoracic region: Secondary | ICD-10-CM

## 2017-07-30 NOTE — Therapy (Signed)
[facility] [place], [place], [address] Phone: [phone]   Fax:  [phone]  Physical Therapy Treatment  Patient Details  Name: Johnny Mathews MRN: [id number] Date of Birth: 23-Sep-1960 Referring Provider: Consuella Lose, MD   Encounter Date: 07/30/2017  PT End of Session - 07/30/17 1708    Visit Number  8    Number of Visits  12    Date for PT Re-Evaluation  08/18/17    Authorization Type  progress note every 10th visit    PT Start Time  1600    PT Stop Time  1651    PT Time Calculation (min)  51 min    Activity Tolerance  Patient tolerated treatment well    Behavior During Therapy  [facility] for tasks assessed/performed       Past Medical History:  Diagnosis Date  . Alpha galactosidase deficiency   . Asthma   . BPH (benign prostatic hypertrophy)   . Diabetes mellitus without complication ([place])   . GERD (gastroesophageal reflux disease)   . Hyperlipidemia   . Muscular dystrophy [facility])     Past Surgical History:  Procedure Laterality Date  . KIDNEY STONE SURGERY    . KNEE SURGERY Right   . SHOULDER OPEN ROTATOR CUFF REPAIR      There were no vitals filed for this visit.  Subjective Assessment - 07/30/17 1635    Subjective  Patient reports he feels like his neck is getting stronger but the feeling of being uncomfortable hasn't gone away    Pertinent History  Mitochondrial Muscular Dystrophy, DM, Asthma, previous left RTC surgery    Limitations  Lifting;House hold activities    Diagnostic tests  MRI    Patient Stated Goals  decrease pain and return to PLOF    Currently in Pain?  Other (Comment) "uncomfortable"    Pain Location  Neck    Pain Orientation  Right;Left    Pain Descriptors / Indicators  Discomfort                       [facility] Adult PT Treatment/Exercise - 07/30/17 0001      Shoulder Exercises: Standing   Extension  Strengthening;Limitations x30    Extension Limitations  Pink  XTS    Row  Strengthening;Both;Other (comment) x30    Row Limitations  Pink XTS    Other Standing Exercises  scapular clocks 12, 3 6 green theraband x20    Other Standing Exercises  High rows pink XTS x30 reps      Shoulder Exercises: ROM/Strengthening   UBE (Upper Arm Bike)  60 RPM x10 min    Wall Pushups  20 reps    X to V Arms  x30 reps      Shoulder Exercises: Stretch   Corner Stretch  3 reps;30 seconds      Modalities   Modalities  Electrical Stimulation;Moist Heat      Moist Heat Therapy   Number Minutes Moist Heat  15 Minutes    Moist Heat Location  Cervical      Electrical Stimulation   Electrical Stimulation Location  B cervical paraspinals, UT    Electrical Stimulation Action  IFC    Electrical Stimulation Parameters  80-150 hz x15 min    Electrical Stimulation Goals  Tone;Pain                  PT Long Term Goals - 07/23/17 1714  PT LONG TERM GOAL #1   Title  Patient will be independent with HEP    Time  6    Period  Weeks    Status  On-going      PT LONG TERM GOAL #2   Title  Patient will report no neurological symptoms down both UEs to indicate no nerve irritation.    Time  6    Period  Weeks    Status  On-going      PT LONG TERM GOAL #3   Title  Patient will demonstrate improved shoulder MMT in all planes to 4+/5 or greater to improve stability during functional activities    Time  6    Period  Weeks    Status  On-going      PT LONG TERM GOAL #4   Title  Patient will demonstrate equal grip strength to 55 lbs of force or greater.    Time  6    Period  Weeks    Status  Achieved            Plan - 07/30/17 1709    Clinical Impression Statement  Patient was able to complete treatment with no reports of increased pain with exercises. Patient noted with increased muscle fatigue with scapular clock exercise which required rest between sets. Patient observed improving posture during session demonstrating strong carryover of cuing from  last visit. Normal response to modalities upon removal.    Clinical Presentation  Evolving    Clinical Decision Making  Moderate    Rehab Potential  Good    PT Frequency  2x / week    PT Duration  6 weeks    PT Treatment/Interventions  ADLs/Self Care Home Management;Cryotherapy;Electrical Stimulation;Ultrasound;Traction;Moist Heat;Therapeutic exercise;Patient/family education;Neuromuscular re-education;Manual techniques;Passive range of motion;Dry needling;Taping    PT Next Visit Plan  UBE, chin tucks, cervical AROM, STW/M to cervical spine, attempt manual traction, modalities PRN for pain relief    Consulted and Agree with Plan of Care  Patient       Patient will benefit from skilled therapeutic intervention in order to improve the following deficits and impairments:  Pain, Postural dysfunction, Decreased activity tolerance, Decreased endurance, Decreased range of motion, Decreased strength  Visit Diagnosis: Radiculopathy, cervicothoracic region  Cervicalgia     Problem List Patient Active Problem List   Diagnosis Date Noted  . Influenza A 01/19/2016  . Flu-like symptoms 01/19/2016  . Acute bronchitis 01/19/2016  . Allergy to meat 06/21/2015  . Essential hypertension 02/05/2015  . Sciatica of left side 10/02/2014  . Diabetes mellitus without complication ([place])   . Right knee meniscal tear 10/26/2013  . GERD (gastroesophageal reflux disease) 12/23/2012  . Hyperlipidemia with target LDL less than 100 12/23/2012  . T2DM (type 2 diabetes mellitus) ([place]) 12/23/2012  . Fluid retention 12/23/2012  . Asthma, mild intermittent 12/23/2012  . Muscular dystrophy ([place]) 04/29/2012  . Right shoulder pain 03/02/2012     [name], PT, DPT 07/30/2017, 5:14 PM  [facility] [address] [place], [place], [address] Phone: [phone]   Fax:  [phone]  Name: Johnny Mathews MRN: [id number] Date of Birth: 1960/11/22

## 2017-08-03 DIAGNOSIS — M50122 Cervical disc disorder at C5-C6 level with radiculopathy: Secondary | ICD-10-CM | POA: Diagnosis not present

## 2017-08-04 ENCOUNTER — Encounter: Payer: BLUE CROSS/BLUE SHIELD | Admitting: Physical Therapy

## 2017-08-06 ENCOUNTER — Encounter: Payer: BLUE CROSS/BLUE SHIELD | Admitting: Physical Therapy

## 2017-08-11 ENCOUNTER — Ambulatory Visit: Payer: BLUE CROSS/BLUE SHIELD | Attending: Neurosurgery | Admitting: Physical Therapy

## 2017-08-11 ENCOUNTER — Encounter: Payer: Self-pay | Admitting: Physical Therapy

## 2017-08-11 DIAGNOSIS — M5413 Radiculopathy, cervicothoracic region: Secondary | ICD-10-CM | POA: Insufficient documentation

## 2017-08-11 DIAGNOSIS — M542 Cervicalgia: Secondary | ICD-10-CM | POA: Diagnosis not present

## 2017-08-11 NOTE — Therapy (Signed)
[facility], [place], [address] Phone: [phone]   Fax:  [phone]  Physical Therapy Treatment  Patient Details  Name: Johnny Mathews MRN: [id number] Date of Birth: 08-Dec-1960 Referring Provider: Consuella Lose, MD   Encounter Date: 08/11/2017  PT End of Session - 08/11/17 1603    Visit Number  9    Number of Visits  12    Date for PT Re-Evaluation  08/18/17    Authorization Type  progress note every 10th visit    PT Start Time  1600    PT Stop Time  1654    PT Time Calculation (min)  54 min    Activity Tolerance  Patient tolerated treatment well    Behavior During Therapy  [facility] for tasks assessed/performed       Past Medical History:  Diagnosis Date  . Alpha galactosidase deficiency   . Asthma   . BPH (benign prostatic hypertrophy)   . Diabetes mellitus without complication ([place])   . GERD (gastroesophageal reflux disease)   . Hyperlipidemia   . Muscular dystrophy [facility])     Past Surgical History:  Procedure Laterality Date  . KIDNEY STONE SURGERY    . KNEE SURGERY Right   . SHOULDER OPEN ROTATOR CUFF REPAIR      There were no vitals filed for this visit.  Subjective Assessment - 08/11/17 1602    Subjective  Patient reported he drove quite a bit over the weekend; this morning's pain was 8/10. As the day went on, the pain decreased to 2/10    Pertinent History  Mitochondrial Muscular Dystrophy, DM, Asthma, previous left RTC surgery    Limitations  Lifting;House hold activities    Diagnostic tests  MRI    Patient Stated Goals  decrease pain and return to PLOF    Currently in Pain?  Yes    Pain Score  2     Pain Orientation  Right;Left    Pain Descriptors / Indicators  Discomfort    Pain Type  Chronic pain    Pain Onset  More than a month ago         [facility] PT Assessment - 08/11/17 0001      Assessment   Medical Diagnosis  cervical disc disorder at C5-C6 with radiculopathy    Hand  Dominance  Right    Next MD Visit  n/a    Prior Therapy  no                   [facility] Adult PT Treatment/Exercise - 08/11/17 0001      Shoulder Exercises: Standing   Extension  Strengthening;Limitations    Extension Limitations  Pink XTS x30    Row  Strengthening;Both;Other (comment)    Row Limitations  Pink XTS x30    Diagonals  Strengthening;Both;10 reps;Theraband D2 flexion x30    Theraband Level (Shoulder Diagonals)  Level 3 (Green)    Other Standing Exercises  High rows pink XTS x30 reps      Shoulder Exercises: ROM/Strengthening   UBE (Upper Arm Bike)  60 RPM x10 min    Wall Pushups  10 reps;20 reps    "W" Arms  x20      Modalities   Modalities  Electrical Stimulation;Moist Heat      Moist Heat Therapy   Number Minutes Moist Heat  15 Minutes    Moist Heat Location  Cervical      Electrical Stimulation   Electrical Stimulation  Location  B cervical paraspinals, UT    Electrical Stimulation Action  IFC    Electrical Stimulation Parameters  80-150 hz x15     Electrical Stimulation Goals  Tone;Pain      Manual Therapy   Manual Therapy  Soft tissue mobilization    Soft tissue mobilization  IASTM to L UT to decrease pain and reduce muscle tone                  PT Long Term Goals - 08/11/17 1748      PT LONG TERM GOAL #1   Title  Patient will be independent with HEP    Time  6    Period  Weeks    Status  On-going      PT LONG TERM GOAL #2   Title  Patient will report no neurological symptoms down both UEs to indicate no nerve irritation.    Time  6    Period  Weeks    Status  Achieved      PT LONG TERM GOAL #3   Title  Patient will demonstrate improved shoulder MMT in all planes to 4+/5 or greater to improve stability during functional activities    Time  6    Period  Weeks    Status  On-going NT 08/11/17      PT LONG TERM GOAL #4   Title  Patient will demonstrate equal grip strength to 55 lbs of force or greater.    Time  6    Period   Weeks    Status  Achieved            Plan - 08/11/17 1642    Clinical Impression Statement  Patient was able to tolerate treatment well. Patient was able to perform exercises with no reports of increased pain. Patient overall states improvements in pain and reports no nerve irritation but still reports neck weakness. Normal response to modalities at end of session.    Clinical Presentation  Evolving    Clinical Decision Making  Moderate    Rehab Potential  Good    PT Frequency  2x / week    PT Duration  6 weeks    PT Treatment/Interventions  ADLs/Self Care Home Management;Cryotherapy;Electrical Stimulation;Ultrasound;Traction;Moist Heat;Therapeutic exercise;Patient/family education;Neuromuscular re-education;Manual techniques;Passive range of motion;Dry needling;Taping    PT Next Visit Plan  UBE, chin tucks, cervical AROM, STW/M to cervical spine, attempt manual traction, modalities PRN for pain relief    Consulted and Agree with Plan of Care  Patient       Patient will benefit from skilled therapeutic intervention in order to improve the following deficits and impairments:  Pain, Postural dysfunction, Decreased activity tolerance, Decreased endurance, Decreased range of motion, Decreased strength  Visit Diagnosis: Radiculopathy, cervicothoracic region  Cervicalgia     Problem List Patient Active Problem List   Diagnosis Date Noted  . Influenza A 01/19/2016  . Flu-like symptoms 01/19/2016  . Acute bronchitis 01/19/2016  . Allergy to meat 06/21/2015  . Essential hypertension 02/05/2015  . Sciatica of left side 10/02/2014  . Diabetes mellitus without complication ([place])   . Right knee meniscal tear 10/26/2013  . GERD (gastroesophageal reflux disease) 12/23/2012  . Hyperlipidemia with target LDL less than 100 12/23/2012  . T2DM (type 2 diabetes mellitus) ([place]) 12/23/2012  . Fluid retention 12/23/2012  . Asthma, mild intermittent 12/23/2012  . Muscular dystrophy ([place])  04/29/2012  . Right shoulder pain 03/02/2012   [name], PT, DPT 08/11/2017, 5:50  PM  [facility] [place], [place], [address] Phone: [phone]   Fax:  [phone]  Name: Johnny Mathews MRN: [id number] Date of Birth: 03/22/1960

## 2017-08-13 ENCOUNTER — Ambulatory Visit: Payer: BLUE CROSS/BLUE SHIELD | Admitting: Physical Therapy

## 2017-08-13 ENCOUNTER — Encounter: Payer: Self-pay | Admitting: Physical Therapy

## 2017-08-13 DIAGNOSIS — M542 Cervicalgia: Secondary | ICD-10-CM | POA: Diagnosis not present

## 2017-08-13 DIAGNOSIS — M5413 Radiculopathy, cervicothoracic region: Secondary | ICD-10-CM | POA: Diagnosis not present

## 2017-08-13 NOTE — Therapy (Signed)
[facility] [place], [place], [address] Phone: [phone]   Fax:  [phone]  Physical Therapy Treatment  Patient Details  Name: Johnny Mathews MRN: [id number] Date of Birth: [birth year] Referring Provider: Consuella Lose, MD   Encounter Date: 08/13/2017  PT End of Session - 08/13/17 1609    Visit Number  11   mistake on previous visit   Number of Visits  12    Date for PT Re-Evaluation  08/18/17    Authorization Type  progress note every 10th visit    PT Start Time  1604    PT Stop Time  1655    PT Time Calculation (min)  51 min    Activity Tolerance  Patient tolerated treatment well    Behavior During Therapy  [facility] for tasks assessed/performed       Past Medical History:  Diagnosis Date  . Alpha galactosidase deficiency   . Asthma   . BPH (benign prostatic hypertrophy)   . Diabetes mellitus without complication ([place])   . GERD (gastroesophageal reflux disease)   . Hyperlipidemia   . Muscular dystrophy [facility])     Past Surgical History:  Procedure Laterality Date  . KIDNEY STONE SURGERY    . KNEE SURGERY Right   . SHOULDER OPEN ROTATOR CUFF REPAIR      There were no vitals filed for this visit.  Subjective Assessment - 08/13/17 1609    Subjective  Patient reports he will finish up remaining visits in plan of care and be placed on hold until MD's visit.    Pertinent History  Mitochondrial Muscular Dystrophy, DM, Asthma, previous left RTC surgery    Limitations  Lifting;House hold activities    Diagnostic tests  MRI    Patient Stated Goals  decrease pain and return to PLOF    Currently in Pain?  Yes    Pain Score  2     Pain Location  Neck    Pain Orientation  Right;Left    Pain Descriptors / Indicators  Discomfort    Pain Type  Chronic pain    Pain Onset  More than a month ago         [facility] PT Assessment - 08/13/17 0001      Assessment   Medical Diagnosis  cervical disc disorder at C5-C6 with  radiculopathy    Hand Dominance  Right    Next MD Visit  n/a    Prior Therapy  no                   [facility] Adult PT Treatment/Exercise - 08/13/17 0001      Exercises   Exercises  Shoulder;Neck      Neck Exercises: Prone   Neck Retraction  20 reps    W Back  10 reps   x10 without weights x10 with 3#; emphasis on chin tuck   Other Prone Exercise  Ys x10 3#. x10 without weight      Shoulder Exercises: Standing   Extension  Strengthening;Limitations    Extension Limitations  Pink XTS x30    Row  Strengthening;Both;Other (comment)    Row Limitations  Pink XTS x30    Other Standing Exercises  High rows pink XTS x30 reps      Shoulder Exercises: ROM/Strengthening   UBE (Upper Arm Bike)  60 RPM x10 min    "W" Arms  x20    X to V Arms  --  Shoulder Exercises: Stretch   Corner Stretch  4 reps;30 seconds      Modalities   Modalities  Electrical Stimulation;Moist Heat      Moist Heat Therapy   Number Minutes Moist Heat  10 Minutes    Moist Heat Location  Cervical      Electrical Stimulation   Electrical Stimulation Location  B cervical paraspinals, UT    Electrical Stimulation Action  IFC    Electrical Stimulation Parameters  80-150 hz x10 min    Electrical Stimulation Goals  Tone;Pain      Manual Therapy   Manual Therapy  Soft tissue mobilization    Soft tissue mobilization  IASTM to L UT to decrease pain and reduce muscle tone                  PT Long Term Goals - 08/11/17 1748      PT LONG TERM GOAL #1   Title  Patient will be independent with HEP    Time  6    Period  Weeks    Status  On-going      PT LONG TERM GOAL #2   Title  Patient will report no neurological symptoms down both UEs to indicate no nerve irritation.    Time  6    Period  Weeks    Status  Achieved      PT LONG TERM GOAL #3   Title  Patient will demonstrate improved shoulder MMT in all planes to 4+/5 or greater to improve stability during functional activities     Time  6    Period  Weeks    Status  On-going   NT 08/11/17     PT LONG TERM GOAL #4   Title  Patient will demonstrate equal grip strength to 55 lbs of force or greater.    Time  6    Period  Weeks    Status  Achieved              Patient will benefit from skilled therapeutic intervention in order to improve the following deficits and impairments:     Visit Diagnosis: Radiculopathy, cervicothoracic region  Cervicalgia     Problem List Patient Active Problem List   Diagnosis Date Noted  . Influenza A 01/19/2016  . Flu-like symptoms 01/19/2016  . Acute bronchitis 01/19/2016  . Allergy to meat 06/21/2015  . Essential hypertension 02/05/2015  . Sciatica of left side 10/02/2014  . Diabetes mellitus without complication ([place])   . Right knee meniscal tear 10/26/2013  . GERD (gastroesophageal reflux disease) 12/23/2012  . Hyperlipidemia with target LDL less than 100 12/23/2012  . T2DM (type 2 diabetes mellitus) ([place]) 12/23/2012  . Fluid retention 12/23/2012  . Asthma, mild intermittent 12/23/2012  . Muscular dystrophy ([place]) 04/29/2012  . Right shoulder pain 03/02/2012   [name], PT, DPT 08/13/2017, 5:25 PM  [facility] [address] [place], [place], [address] Phone: [phone]   Fax:  [phone]  Name: Johnny Mathews MRN: [id number] Date of Birth: Oct 23, 1960

## 2017-08-18 ENCOUNTER — Ambulatory Visit: Payer: BLUE CROSS/BLUE SHIELD | Admitting: Physical Therapy

## 2017-08-18 ENCOUNTER — Encounter: Payer: Self-pay | Admitting: Physical Therapy

## 2017-08-18 DIAGNOSIS — M5413 Radiculopathy, cervicothoracic region: Secondary | ICD-10-CM

## 2017-08-18 DIAGNOSIS — M542 Cervicalgia: Secondary | ICD-10-CM | POA: Diagnosis not present

## 2017-08-18 NOTE — Therapy (Addendum)
[facility] [place], [place], [address] Phone: [phone]   Fax:  [phone]  Physical Therapy Treatment/Discharge  Patient Details  Name: Johnny Mathews MRN: [id number] Date of Birth: 1960-07-13 Referring Provider: Consuella Lose, MD   Encounter Date: 08/18/2017  PT End of Session - 08/18/17 1803    Visit Number  12    Number of Visits  12    Date for PT Re-Evaluation  08/18/17    Authorization Type  progress note every 10th visit    PT Start Time  0400    PT Stop Time  0459    PT Time Calculation (min)  59 min       Past Medical History:  Diagnosis Date  . Alpha galactosidase deficiency   . Asthma   . BPH (benign prostatic hypertrophy)   . Diabetes mellitus without complication ([place])   . GERD (gastroesophageal reflux disease)   . Hyperlipidemia   . Muscular dystrophy [facility])     Past Surgical History:  Procedure Laterality Date  . KIDNEY STONE SURGERY    . KNEE SURGERY Right   . SHOULDER OPEN ROTATOR CUFF REPAIR      There were no vitals filed for this visit.  Subjective Assessment - 08/18/17 1804    Subjective  I'm gonna be put on hold after today.    Currently in Pain?  Yes    Pain Score  2     Pain Location  Neck    Pain Orientation  Right;Left    Pain Descriptors / Indicators  Discomfort    Pain Type  Chronic pain    Pain Onset  More than a month ago                       [facility] Adult PT Treatment/Exercise - 08/18/17 0001      Modalities   Modalities  Electrical Stimulation;Moist Heat;Ultrasound      Moist Heat Therapy   Number Minutes Moist Heat  20 Minutes    Moist Heat Location  --   Left cervical.     Electrical Stimulation   Electrical Stimulation Location  Left cervical region.    Electrical Stimulation Action  IFC    Electrical Stimulation Parameters  80-150 Hz x 20 minutes.    Electrical Stimulation Goals  Tone;Pain      Ultrasound   Ultrasound Location   Left cervical region.    Ultrasound Parameters  Combo e'stim/U/S at 1.50 W/CM2 x 12 minutes.    Ultrasound Goals  Pain      Manual Therapy   Manual Therapy  Soft tissue mobilization    Soft tissue mobilization  STW/M including left UT release technique and ischemic release x 11 minutes.                  PT Long Term Goals - 08/11/17 1748      PT LONG TERM GOAL #1   Title  Patient will be independent with HEP    Time  6    Period  Weeks    Status  On-going      PT LONG TERM GOAL #2   Title  Patient will report no neurological symptoms down both UEs to indicate no nerve irritation.    Time  6    Period  Weeks    Status  Achieved      PT LONG TERM GOAL #3   Title  Patient will demonstrate improved  shoulder MMT in all planes to 4+/5 or greater to improve stability during functional activities    Time  6    Period  Weeks    Status  On-going   NT 08/11/17     PT LONG TERM GOAL #4   Title  Patient will demonstrate equal grip strength to 55 lbs of force or greater.    Time  6    Period  Weeks    Status  Achieved            Plan - 08/18/17 1813    Clinical Impression Statement  Patient really enjoyed the combo e'stim/U/S treatment today and felt better after treatment.    PT Treatment/Interventions  ADLs/Self Care Home Management;Cryotherapy;Electrical Stimulation;Ultrasound;Traction;Moist Heat;Therapeutic exercise;Patient/family education;Neuromuscular re-education;Manual techniques;Passive range of motion;Dry needling;Taping    PT Next Visit Plan  UBE, chin tucks, cervical AROM, STW/M to cervical spine, attempt manual traction, modalities PRN for pain relief    PT Home Exercise Plan  chin tucks, scapular retractions; corner stretch, horizontal abduction, wall pushup    Consulted and Agree with Plan of Care  Patient       Patient will benefit from skilled therapeutic intervention in order to improve the following deficits and impairments:  Pain, Postural  dysfunction, Decreased activity tolerance, Decreased endurance, Decreased range of motion, Decreased strength  Visit Diagnosis: Radiculopathy, cervicothoracic region  Cervicalgia     Problem List Patient Active Problem List   Diagnosis Date Noted  . Influenza A 01/19/2016  . Flu-like symptoms 01/19/2016  . Acute bronchitis 01/19/2016  . Allergy to meat 06/21/2015  . Essential hypertension 02/05/2015  . Sciatica of left side 10/02/2014  . Diabetes mellitus without complication ([place])   . Right knee meniscal tear 10/26/2013  . GERD (gastroesophageal reflux disease) 12/23/2012  . Hyperlipidemia with target LDL less than 100 12/23/2012  . T2DM (type 2 diabetes mellitus) ([place]) 12/23/2012  . Fluid retention 12/23/2012  . Asthma, mild intermittent 12/23/2012  . Muscular dystrophy ([place]) 04/29/2012  . Right shoulder pain 03/02/2012   PHYSICAL THERAPY DISCHARGE SUMMARY  Visits from Start of Care: 12  Current functional level related to goals / functional outcomes: See above   Remaining deficits: See goals   Education / Equipment: HEP Plan: Patient agrees to discharge.  Patient goals were partially met. Patient is being discharged due to not returning since the last visit.  ?????   [name], PT, DPT  [place], [place] MPT 08/18/2017, 6:15 PM  [facility] [address] [place], [place], [address] Phone: [phone]   Fax:  [phone]  Name: WOLF BOULAY MRN: [id number] Date of Birth: [birth year]

## 2017-08-26 DIAGNOSIS — Z6827 Body mass index (BMI) 27.0-27.9, adult: Secondary | ICD-10-CM | POA: Diagnosis not present

## 2017-08-26 DIAGNOSIS — M542 Cervicalgia: Secondary | ICD-10-CM | POA: Diagnosis not present

## 2017-09-09 DIAGNOSIS — Z79899 Other long term (current) drug therapy: Secondary | ICD-10-CM | POA: Diagnosis not present

## 2017-09-09 DIAGNOSIS — F902 Attention-deficit hyperactivity disorder, combined type: Secondary | ICD-10-CM | POA: Diagnosis not present

## 2017-10-05 DIAGNOSIS — M50122 Cervical disc disorder at C5-C6 level with radiculopathy: Secondary | ICD-10-CM | POA: Diagnosis not present

## 2017-10-26 ENCOUNTER — Ambulatory Visit: Payer: BLUE CROSS/BLUE SHIELD | Admitting: Family Medicine

## 2017-10-26 ENCOUNTER — Encounter: Payer: Self-pay | Admitting: Family Medicine

## 2017-10-26 VITALS — BP 134/84 | HR 72 | Temp 98.1°F | Ht 69.0 in | Wt 179.6 lb

## 2017-10-26 DIAGNOSIS — M1990 Unspecified osteoarthritis, unspecified site: Secondary | ICD-10-CM

## 2017-10-26 MED ORDER — PREDNISONE 20 MG PO TABS: ORAL_TABLET | ORAL | 0 refills | Status: DC

## 2017-10-26 NOTE — Progress Notes (Signed)
 BP 134/84   Pulse 72   Temp 98.1 F (36.7 C) (Oral)   Ht 5' 9" (1.753 m)   Wt 81.5 kg   BMI 26.52 kg/m    Subjective:    Patient ID: Johnny Mathews, male    DOB: 08/04/1960, 57 y.o.   MRN: [id number]  HPI: Johnny Mathews is a 57 y.o. male presenting on 10/26/2017 for right hand pain for 2 days. Pain started Saturday morning with no history of injury or trauma. Pain is sharp and located in the 3rd and 4th MCP joint on the dorsal side. On Sunday patient noted swelling of the right hand. Pain has been constant and worse with movement. He has never had this kind of pain before. Ibuprofen minimally relieved symptoms. 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 Patient is right handed. Patient denies fever, chills, skin changes, chest pain, sob, or pain in any other joint. Patient has no history of gout.   Relevant past medical, surgical, family and social history reviewed and updated as indicated. Interim medical history since our last visit reviewed. Allergies and medications reviewed and updated.  Review of Systems  Constitutional: Negative.   HENT: Negative.   Respiratory: Negative.  Negative for shortness of breath.   Cardiovascular: Negative.  Negative for chest pain.  Gastrointestinal: Negative for constipation, diarrhea and nausea.  Musculoskeletal: Positive for arthralgias (right hand) and joint swelling (right hand).  Skin: Negative for color change, rash and wound.  Neurological: Negative for weakness.    Per HPI unless specifically indicated above   Allergies as of 10/26/2017      Reactions   Eggs Or Egg-derived Products Swelling   Throat   Septra [sulfamethoxazole-trimethoprim] Swelling   throat   Sulfonamide Derivatives    REACTION: hives      Medication List        Accurate as of 10/26/17 12:29 PM. Always use your most recent med list.          albuterol 108 (90 Base) MCG/ACT inhaler Commonly known as:  PROVENTIL HFA;VENTOLIN  HFA Inhale 2 puffs into the lungs every 4 (four) hours as needed for wheezing or shortness of breath.   amitriptyline 25 MG tablet Commonly known as:  ELAVIL Take 3 tablets (75 mg total) by mouth at bedtime.   azelastine 0.1 % nasal spray Commonly known as:  ASTELIN USE 2 SPRAYS IN EACH NOSTRIL AT BEDTIME   CONTOUR BLOOD GLUCOSE SYSTEM w/Device Kit Test blood sugars four times daily   EPINEPHrine 0.3 mg/0.3 mL Devi Commonly known as:  EPI-PEN Inject 0.3 mg into the skin as needed.   esomeprazole 40 MG capsule Commonly known as:  NEXIUM TAKE ONE (1) CAPSULE EACH DAY   finasteride 5 MG tablet Commonly known as:  PROSCAR Take 1 tablet (5 mg total) by mouth daily.   fluticasone 50 MCG/ACT nasal spray Commonly known as:  FLONASE Place 1-2 sprays into both nostrils daily.   furosemide 20 MG tablet Commonly known as:  LASIX Take 1 tablet (20 mg total) by mouth daily as needed.   glucose blood test strip Test blood sugars four times daily   liraglutide 18 MG/3ML Sopn Commonly known as:  VICTOZA INJECT 0.3ML SQ DAILY   losartan 50 MG tablet Commonly known as:  COZAAR TAKE ONE (1) TABLET EACH DAY   Meclizine HCl 25 MG Chew Chew 1 tablet (25 mg total) by mouth at bedtime.   mometasone-formoterol 100-5 MCG/ACT   Aero Commonly known as:  DULERA USE 2 PUFFS TWICE DAILY   montelukast 10 MG tablet Commonly known as:  SINGULAIR TAKE ONE (1) TABLET EACH DAY   pravastatin 80 MG tablet Commonly known as:  PRAVACHOL Take 1 tablet (80 mg total) by mouth daily.   ranitidine 150 MG tablet Commonly known as:  ZANTAC Take 1 tablet (150 mg total) by mouth 2 (two) times daily.   tadalafil 20 MG tablet Commonly known as:  ADCIRCA/CIALIS Take 0.5-1 tablets (10-20 mg total) by mouth every other day as needed for erectile dysfunction.   tamsulosin 0.4 MG Caps capsule Commonly known as:  FLOMAX TAKE ONE (1) CAPSULE EACH DAY   VYVANSE 30 MG capsule Generic drug:   lisdexamfetamine Take 1 tablet by mouth 2 (two) times daily.          Objective:    BP 134/84   Pulse 72   Temp 98.1 F (36.7 C) (Oral)   Ht 5' 9" (1.753 m)   Wt 81.5 kg   BMI 26.52 kg/m   Wt Readings from Last 3 Encounters:  10/26/17 81.5 kg  06/03/17 84.8 kg  01/15/17 82.1 kg    Physical Exam  Constitutional: He is oriented to person, place, and time. He appears well-developed and well-nourished. No distress.  HENT:  Head: Normocephalic and atraumatic.  Eyes: EOM are normal.  Neck: Normal range of motion.  Cardiovascular: Normal rate, regular rhythm, normal heart sounds and intact distal pulses.  Pulmonary/Chest: Effort normal and breath sounds normal.  Musculoskeletal: He exhibits edema (right hand, dorsal side) and tenderness (MCP of 3rd and 4th, right side).  Neurological: He is alert and oriented to person, place, and time.  Skin: Skin is warm. No rash noted. No erythema.  Psychiatric: He has a normal mood and affect.        Assessment & Plan:   Problem List Items Addressed This Visit    None    Visit Diagnoses    Inflammatory arthritis    -  Primary   Acute, in 2 joints of his hand, will send prednisone   Relevant Medications   predniSONE (DELTASONE) 20 MG tablet      Inflammatory Arthritis Patient presents with 2 days of pain and swelling in the right hand with no history of trauma or injury. Pain is at the 3rd and 4th MCP and worse with extension of the digits. He took ibuprofen this morning with minimal relief. There are no signs of infection on exam. He denies recent illness, fever, skin changes,weakness, or sensory changes. Symptoms are possibly due to inflammatory process. I will prescribe Prednisone x 5 days. Return if symptoms worsen or do not improve.   Follow up plan: Return if symptoms worsen or fail to improve.  Counseling provided for all of the vaccine components No orders of the defined types were placed in this encounter.  Patient seen  and examined with Cadence Kathlen Mody PA student, agree with assessment and plan above.  Likely inflammation due to arthritis, will treat with anti-inflammatories. Caryl Pina, MD [facility] 10/26/2017, 12:29 PM

## 2017-10-28 ENCOUNTER — Encounter: Payer: Self-pay | Admitting: Family Medicine

## 2017-10-28 MED ORDER — NAPROXEN 500 MG PO TABS
500.0000 mg | ORAL_TABLET | Freq: Two times a day (BID) | ORAL | 1 refills | Status: DC
Start: 2017-10-28 — End: 2018-07-23

## 2017-11-02 DIAGNOSIS — M50122 Cervical disc disorder at C5-C6 level with radiculopathy: Secondary | ICD-10-CM | POA: Diagnosis not present

## 2017-11-02 DIAGNOSIS — M50123 Cervical disc disorder at C6-C7 level with radiculopathy: Secondary | ICD-10-CM | POA: Diagnosis not present

## 2017-11-02 DIAGNOSIS — R030 Elevated blood-pressure reading, without diagnosis of hypertension: Secondary | ICD-10-CM | POA: Diagnosis not present

## 2017-11-02 DIAGNOSIS — Z6826 Body mass index (BMI) 26.0-26.9, adult: Secondary | ICD-10-CM | POA: Diagnosis not present

## 2017-12-11 ENCOUNTER — Other Ambulatory Visit: Payer: Self-pay | Admitting: Family Medicine

## 2017-12-17 ENCOUNTER — Other Ambulatory Visit: Payer: Self-pay | Admitting: Family Medicine

## 2017-12-22 DIAGNOSIS — M50122 Cervical disc disorder at C5-C6 level with radiculopathy: Secondary | ICD-10-CM | POA: Diagnosis not present

## 2017-12-22 DIAGNOSIS — Z79899 Other long term (current) drug therapy: Secondary | ICD-10-CM | POA: Diagnosis not present

## 2017-12-22 DIAGNOSIS — G713 Mitochondrial myopathy, not elsewhere classified: Secondary | ICD-10-CM | POA: Diagnosis not present

## 2017-12-22 DIAGNOSIS — F902 Attention-deficit hyperactivity disorder, combined type: Secondary | ICD-10-CM | POA: Diagnosis not present

## 2018-01-19 ENCOUNTER — Encounter: Payer: Self-pay | Admitting: Family Medicine

## 2018-01-19 ENCOUNTER — Ambulatory Visit: Payer: BLUE CROSS/BLUE SHIELD | Admitting: Family Medicine

## 2018-01-19 VITALS — BP 152/90 | Ht 68.5 in | Wt 179.0 lb

## 2018-01-19 DIAGNOSIS — M25512 Pain in left shoulder: Secondary | ICD-10-CM | POA: Diagnosis not present

## 2018-01-19 MED ORDER — METHYLPREDNISOLONE ACETATE 40 MG/ML IJ SUSP
40.0000 mg | Freq: Once | INTRAMUSCULAR | Status: AC
  Administered 2018-01-19: 40 mg via INTRA_ARTICULAR

## 2018-01-19 NOTE — Patient Instructions (Signed)
You have rotator cuff impingement Try to avoid painful activities (overhead activities, lifting with extended arm) as much as possible. Aleve 2 tabs twice a day with food OR ibuprofen 3 tabs three times a day with food for pain and inflammation as needed. Can take tylenol in addition to this. Subacromial injection may be beneficial to help with pain and to decrease inflammation - you were given this today Consider physical therapy with transition to home exercise program. Do home exercise program with theraband and scapular stabilization exercises daily 3 sets of 10 once a day. If not improving at follow-up we will consider further imaging, injection, physical therapy, and/or nitro patches. Follow up with me in 5-6 weeks.

## 2018-01-20 ENCOUNTER — Encounter: Payer: Self-pay | Admitting: Family Medicine

## 2018-01-20 NOTE — Progress Notes (Signed)
PCP: Dettinger, Joshua A, MD  Subjective:   HPI: Patient is a 58 y.o. male here for left shoulder pain.  Patient reports he has had about 1 month of lateral left shoulder pain. Pain is worse at night and up to an 8 out of 10 level, sharp. Only about 4 out of 10 level during the day. He is right-handed. States reaching hurts him worse. He is tried nitro patches, ibuprofen. Denies any acute injury or trauma. Remote history of rotator cuff repair of this shoulder. No skin changes or numbness.  Past Medical History:  Diagnosis Date  . Alpha galactosidase deficiency   . Asthma   . BPH (benign prostatic hypertrophy)   . Diabetes mellitus without complication (HCC)   . GERD (gastroesophageal reflux disease)   . Hyperlipidemia   . Muscular dystrophy (HCC)     Current Outpatient Medications on File Prior to Visit  Medication Sig Dispense Refill  . albuterol (PROAIR HFA) 108 (90 Base) MCG/ACT inhaler Inhale 2 puffs into the lungs every 4 (four) hours as needed for wheezing or shortness of breath. 1 Inhaler 4  . amitriptyline (ELAVIL) 25 MG tablet Take 3 tablets (75 mg total) by mouth at bedtime. 270 tablet 3  . azelastine (ASTELIN) 0.1 % nasal spray USE 2 SPRAYS IN EACH NOSTRIL AT BEDTIME 30 mL 2  . Blood Glucose Monitoring Suppl (CONTOUR BLOOD GLUCOSE SYSTEM) w/Device KIT Test blood sugars four times daily 200 each 5  . EPINEPHrine (EPI-PEN) 0.3 mg/0.3 mL DEVI Inject 0.3 mg into the skin as needed.     . esomeprazole (NEXIUM) 40 MG capsule TAKE ONE (1) CAPSULE EACH DAY 90 capsule 3  . finasteride (PROSCAR) 5 MG tablet Take 1 tablet (5 mg total) by mouth daily. 90 tablet 3  . fluticasone (FLONASE) 50 MCG/ACT nasal spray Place 1-2 sprays into both nostrils daily. 48 g 1  . furosemide (LASIX) 20 MG tablet Take 1 tablet (20 mg total) by mouth daily as needed. 90 tablet 3  . glucose blood (CONTOUR NEXT TEST) test strip Test blood sugars four times daily 200 each 5  . liraglutide (VICTOZA)  18 MG/3ML SOPN INJECT 0.3ML SQ DAILY 9 mL 3  . losartan (COZAAR) 50 MG tablet TAKE ONE (1) TABLET EACH DAY 90 tablet 3  . Meclizine HCl 25 MG CHEW Chew 1 tablet (25 mg total) by mouth at bedtime. 90 tablet 1  . mometasone-formoterol (DULERA) 100-5 MCG/ACT AERO USE 2 PUFFS TWICE DAILY 13 g 3  . montelukast (SINGULAIR) 10 MG tablet TAKE ONE (1) TABLET EACH DAY 90 tablet 3  . naproxen (NAPROSYN) 500 MG tablet Take 1 tablet (500 mg total) by mouth 2 (two) times daily with a meal. 60 tablet 1  . pravastatin (PRAVACHOL) 80 MG tablet Take 1 tablet (80 mg total) by mouth daily. 90 tablet 3  . predniSONE (DELTASONE) 20 MG tablet 2 po at same time daily for 5 days 10 tablet 0  . ranitidine (ZANTAC) 150 MG tablet Take 1 tablet (150 mg total) by mouth 2 (two) times daily. 180 tablet 3  . tadalafil (ADCIRCA/CIALIS) 20 MG tablet Take 0.5-1 tablets (10-20 mg total) by mouth every other day as needed for erectile dysfunction. 5 tablet 11  . tamsulosin (FLOMAX) 0.4 MG CAPS capsule TAKE ONE (1) CAPSULE EACH DAY 90 capsule 1  . VYVANSE 30 MG capsule Take 1 tablet by mouth 2 (two) times daily.  0   No current facility-administered medications on file prior to visit.       Past Surgical History:  Procedure Laterality Date  . KIDNEY STONE SURGERY    . KNEE SURGERY Right   . SHOULDER OPEN ROTATOR CUFF REPAIR      Allergies  Allergen Reactions  . Eggs Or Egg-Derived Products Swelling    Throat  . Septra [Sulfamethoxazole-Trimethoprim] Swelling    throat  . Sulfonamide Derivatives     REACTION: hives    Social History   Socioeconomic History  . Marital status: Married    Spouse name: Not on file  . Number of children: Not on file  . Years of education: Not on file  . Highest education level: Not on file  Occupational History  . Occupation: Physiological scientist  Social Needs  . Financial resource strain: Not on file  . Food insecurity:    Worry: Not on file    Inability: Not on file  .  Transportation needs:    Medical: Not on file    Non-medical: Not on file  Tobacco Use  . Smoking status: Former Smoker    Packs/day: 1.00    Years: 10.00    Pack years: 10.00    Types: Cigarettes    Last attempt to quit: 01/06/1994    Years since quitting: 24.0  . Smokeless tobacco: Never Used  Substance and Sexual Activity  . Alcohol use: Yes    Alcohol/week: 0.0 standard drinks    Comment: social  . Drug use: No  . Sexual activity: Not on file  Lifestyle  . Physical activity:    Days per week: Not on file    Minutes per session: Not on file  . Stress: Not on file  Relationships  . Social connections:    Talks on phone: Not on file    Gets together: Not on file    Attends religious service: Not on file    Active member of club or organization: Not on file    Attends meetings of clubs or organizations: Not on file    Relationship status: Not on file  . Intimate partner violence:    Fear of current or ex partner: Not on file    Emotionally abused: Not on file    Physically abused: Not on file    Forced sexual activity: Not on file  Other Topics Concern  . Not on file  Social History Narrative  . Not on file    Family History  Problem Relation Age of Onset  . Breast cancer Mother   . Heart disease Father   . Breast cancer Sister     BP (!) 152/90   Ht 5' 8.5" (1.74 m)   Wt 179 lb (81.2 kg)   BMI 26.82 kg/m   Review of Systems: See HPI above.     Objective:  Physical Exam:  Gen: NAD, comfortable in exam room  Left shoulder: No swelling, ecchymoses.  No gross deformity. No TTP AC joint, biceps tendon. FROM with painful arc. Positive Hawkins, negative Neers. Negative Yergasons. Strength 5/5 with empty can and resisted internal/external rotation.  Pain IR and empty can. Negative apprehension. NV intact distally.  Right shoulder: No swelling, ecchymoses.  No gross deformity. No TTP. FROM. Strength 5/5 with empty can and resisted internal/external  rotation. NV intact distally.   Assessment & Plan:  1.  Left shoulder pain: Secondary to rotator cuff impingement.  We discussed options and went ahead with a subacromial injection under ultrasound guidance.  Shown a home exercise program to start daily.  Aleve or ibuprofen as  needed.  Follow-up in 5 to 6 weeks.  Consider further imaging, physical therapy, nitro patches, repeat injection if not improving.  After informed written consent timeout was performed, patient was seated in chair in exam room. Left shoulder was prepped with alcohol swab and utilizing lateral approach with ultrasound guidance, patient's left subacromial space was injected with 3:1 marcaine: depomedrol. Patient tolerated the procedure well without immediate complications. 

## 2018-01-23 DIAGNOSIS — J45901 Unspecified asthma with (acute) exacerbation: Secondary | ICD-10-CM | POA: Diagnosis not present

## 2018-01-23 DIAGNOSIS — J029 Acute pharyngitis, unspecified: Secondary | ICD-10-CM | POA: Diagnosis not present

## 2018-01-26 ENCOUNTER — Encounter: Payer: Self-pay | Admitting: Family Medicine

## 2018-01-26 ENCOUNTER — Ambulatory Visit: Payer: BLUE CROSS/BLUE SHIELD | Admitting: Family Medicine

## 2018-01-26 ENCOUNTER — Other Ambulatory Visit: Payer: Self-pay | Admitting: Family Medicine

## 2018-01-26 ENCOUNTER — Ambulatory Visit (INDEPENDENT_AMBULATORY_CARE_PROVIDER_SITE_OTHER): Payer: BLUE CROSS/BLUE SHIELD

## 2018-01-26 VITALS — BP 129/78 | HR 99 | Temp 97.4°F | Ht 68.5 in | Wt 180.0 lb

## 2018-01-26 DIAGNOSIS — R220 Localized swelling, mass and lump, head: Secondary | ICD-10-CM

## 2018-01-26 DIAGNOSIS — S0033XA Contusion of nose, initial encounter: Secondary | ICD-10-CM | POA: Diagnosis not present

## 2018-01-26 DIAGNOSIS — S0992XA Unspecified injury of nose, initial encounter: Secondary | ICD-10-CM | POA: Diagnosis not present

## 2018-01-26 NOTE — Patient Instructions (Signed)
Contusion    A contusion is a deep bruise. Contusions happen when an injury causes bleeding under the skin. Symptoms of bruising include pain, swelling, and discolored skin. The skin may turn blue, purple, or yellow.  Follow these instructions at home:   Rest the injured area.   If told, put ice on the injured area.  ? Put ice in a plastic bag.  ? Place a towel between your skin and the bag.  ? Leave the ice on for 20 minutes, 2-3 times per day.   If told, put light pressure (compression) on the injured area using an elastic bandage. Make sure the bandage is not too tight. Remove it and put it back on as told by your doctor.   If possible, raise (elevate) the injured area above the level of your heart while you are sitting or lying down.   Take over-the-counter and prescription medicines only as told by your doctor.  Contact a doctor if:   Your symptoms do not get better after several days of treatment.   Your symptoms get worse.   You have trouble moving the injured area.  Get help right away if:   You have very bad pain.   You have a loss of feeling (numbness) in a hand or foot.   Your hand or foot turns pale or cold.  This information is not intended to replace advice given to you by your health care provider. Make sure you discuss any questions you have with your health care provider.  Document Released: 06/11/2007 Document Revised: 05/31/2015 Document Reviewed: 05/10/2014  Elsevier Interactive Patient Education  2019 [employer]

## 2018-01-26 NOTE — Progress Notes (Signed)
Subjective:    Patient ID: Johnny Mathews, male    DOB: Mar 30, 1960, 58 y.o.   MRN: [id number]  Chief Complaint:  [place] and  hit nose last night   HPI: Johnny Mathews is a 58 y.o. male presenting on 01/26/2018 for [place] and  hit nose last night  Pt presents today for a nasal injury. 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 States he had immediate epistaxis. Denies LOC or confusion. No headaches. States he continues to have pain and swelling.   Relevant past medical, surgical, family, and social history reviewed and updated as indicated.  Allergies and medications reviewed and updated.   Past Medical History:  Diagnosis Date  . Alpha galactosidase deficiency   . Asthma   . BPH (benign prostatic hypertrophy)   . Diabetes mellitus without complication ([place])   . GERD (gastroesophageal reflux disease)   . Hyperlipidemia   . Muscular dystrophy [facility])     Past Surgical History:  Procedure Laterality Date  . KIDNEY STONE SURGERY    . KNEE SURGERY Right   . SHOULDER OPEN ROTATOR CUFF REPAIR      Social History   Socioeconomic History  . Marital status: Married    Spouse name: Not on file  . Number of children: Not on file  . Years of education: Not on file  . Highest education level: Not on file  Occupational History  . Occupation: Physiological scientist  Social Needs  . Financial resource strain: Not on file  . Food insecurity:    Worry: Not on file    Inability: Not on file  . Transportation needs:    Medical: Not on file    Non-medical: Not on file  Tobacco Use  . Smoking status: Former Smoker    Packs/day: 1.00    Years: 10.00    Pack years: 10.00    Types: Cigarettes    Last attempt to quit: 01/06/1994    Years since quitting: 24.0  . Smokeless tobacco: Never Used  Substance and Sexual Activity  . Alcohol use: Yes    Alcohol/week: 0.0 standard drinks    Comment: social  . Drug use: No  . Sexual activity: Not on file    Lifestyle  . Physical activity:    Days per week: Not on file    Minutes per session: Not on file  . Stress: Not on file  Relationships  . Social connections:    Talks on phone: Not on file    Gets together: Not on file    Attends religious service: Not on file    Active member of club or organization: Not on file    Attends meetings of clubs or organizations: Not on file    Relationship status: Not on file  . Intimate partner violence:    Fear of current or ex partner: Not on file    Emotionally abused: Not on file    Physically abused: Not on file    Forced sexual activity: Not on file  Other Topics Concern  . Not on file  Social History Narrative  . Not on file    Outpatient Encounter Medications as of 01/26/2018  Medication Sig  . albuterol (PROAIR HFA) 108 (90 Base) MCG/ACT inhaler Inhale 2 puffs into the lungs every 4 (four) hours as needed for wheezing or shortness of breath.  Marland Kitchen amitriptyline (ELAVIL) 25 MG tablet Take 3 tablets (75 mg total)  by mouth at bedtime.  Marland Kitchen azelastine (ASTELIN) 0.1 % nasal spray USE 2 SPRAYS IN EACH NOSTRIL AT BEDTIME  . Blood Glucose Monitoring Suppl (CONTOUR BLOOD GLUCOSE SYSTEM) w/Device KIT Test blood sugars four times daily  . EPINEPHrine (EPI-PEN) 0.3 mg/0.3 mL DEVI Inject 0.3 mg into the skin as needed.   Marland Kitchen esomeprazole (NEXIUM) 40 MG capsule TAKE ONE (1) CAPSULE EACH DAY  . finasteride (PROSCAR) 5 MG tablet Take 1 tablet (5 mg total) by mouth daily.  . fluticasone (FLONASE) 50 MCG/ACT nasal spray Place 1-2 sprays into both nostrils daily.  . furosemide (LASIX) 20 MG tablet Take 1 tablet (20 mg total) by mouth daily as needed.  Marland Kitchen glucose blood (CONTOUR NEXT TEST) test strip Test blood sugars four times daily  . liraglutide (VICTOZA) 18 MG/3ML SOPN INJECT 0.3ML SQ DAILY  . losartan (COZAAR) 50 MG tablet TAKE ONE (1) TABLET EACH DAY  . Meclizine HCl 25 MG CHEW Chew 1 tablet (25 mg total) by mouth at bedtime.  . mometasone-formoterol  (DULERA) 100-5 MCG/ACT AERO USE 2 PUFFS TWICE DAILY  . montelukast (SINGULAIR) 10 MG tablet TAKE ONE (1) TABLET EACH DAY  . naproxen (NAPROSYN) 500 MG tablet Take 1 tablet (500 mg total) by mouth 2 (two) times daily with a meal.  . pravastatin (PRAVACHOL) 80 MG tablet Take 1 tablet (80 mg total) by mouth daily.  . ranitidine (ZANTAC) 150 MG tablet Take 1 tablet (150 mg total) by mouth 2 (two) times daily.  . tadalafil (ADCIRCA/CIALIS) 20 MG tablet Take 0.5-1 tablets (10-20 mg total) by mouth every other day as needed for erectile dysfunction.  . tamsulosin (FLOMAX) 0.4 MG CAPS capsule TAKE ONE (1) CAPSULE EACH DAY  . VYVANSE 30 MG capsule Take 1 tablet by mouth 2 (two) times daily.  . [DISCONTINUED] predniSONE (DELTASONE) 20 MG tablet 2 po at same time daily for 5 days   No facility-administered encounter medications on file as of 01/26/2018.     Allergies  Allergen Reactions  . Eggs Or Egg-Derived Products Swelling    Throat  . Septra [Sulfamethoxazole-Trimethoprim] Swelling    throat  . Sulfonamide Derivatives     REACTION: hives    Review of Systems  Constitutional: Negative for chills, fatigue and fever.  HENT: Positive for nosebleeds.        Nasal injury  Eyes: Negative for photophobia and visual disturbance.  Respiratory: Negative for cough, chest tightness and shortness of breath.   Cardiovascular: Negative for chest pain and palpitations.  Skin: Positive for wound.  Neurological: Negative for syncope, weakness and headaches.  Psychiatric/Behavioral: Negative for confusion.  All other systems reviewed and are negative.       Objective:    BP 129/78   Pulse 99   Temp (!) 97.4 F (36.3 C) (Oral)   Ht 5' 8.5" (1.74 m)   Wt 180 lb (81.6 kg)   BMI 26.97 kg/m    Wt Readings from Last 3 Encounters:  01/26/18 180 lb (81.6 kg)  01/19/18 179 lb (81.2 kg)  10/26/17 179 lb 9.6 oz (81.5 kg)    Physical Exam Vitals signs and nursing note reviewed.  Constitutional:       Appearance: Normal appearance. He is well-developed and well-groomed.  HENT:     Head: Normocephalic.     Jaw: There is normal jaw occlusion.     Right Ear: Hearing, tympanic membrane, ear canal and external ear normal.     Left Ear: Hearing, tympanic membrane, ear canal  and external ear normal.     Nose: Signs of injury (Swelling and ecchymosis ) and nasal tenderness present. No septal deviation.     Right Nostril: No foreign body, epistaxis, septal hematoma or occlusion.     Left Nostril: No foreign body, epistaxis, septal hematoma or occlusion.      Mouth/Throat:     Lips: Pink.     Mouth: Mucous membranes are moist.     Pharynx: Oropharynx is clear. Uvula midline.  Eyes:     General: Lids are normal.     Extraocular Movements: Extraocular movements intact.     Conjunctiva/sclera: Conjunctivae normal.     Pupils: Pupils are equal, round, and reactive to light.  Cardiovascular:     Rate and Rhythm: Normal rate and regular rhythm.     Heart sounds: Normal heart sounds.  Pulmonary:     Effort: Pulmonary effort is normal.     Breath sounds: Normal breath sounds.  Skin:    General: Skin is warm and dry.     Capillary Refill: Capillary refill takes less than 2 seconds.     Findings: Abrasion (nasal bridge) and ecchymosis (nasal bridge) present.  Neurological:     General: No focal deficit present.     Mental Status: He is alert and oriented to person, place, and time.     Cranial Nerves: Cranial nerves are intact.     Sensory: Sensation is intact.     Motor: Motor function is intact.  Psychiatric:        Mood and Affect: Mood normal.        Behavior: Behavior normal. Behavior is cooperative.        Thought Content: Thought content normal.        Judgment: Judgment normal.     Results for orders placed or performed in visit on 06/03/17  TSH  Result Value Ref Range   TSH 2.040 0.450 - 4.500 uIU/mL  Vitamin B12  Result Value Ref Range   Vitamin B-12 650 232 - 1,245 pg/mL    CBC with Differential/Platelet  Result Value Ref Range   WBC 5.4 3.4 - 10.8 x10E3/uL   RBC 4.31 4.14 - 5.80 x10E6/uL   Hemoglobin 13.7 13.0 - 17.7 g/dL   Hematocrit 41.7 37.5 - 51.0 %   MCV 97 79 - 97 fL   MCH 31.8 26.6 - 33.0 pg   MCHC 32.9 31.5 - 35.7 g/dL   RDW 13.9 12.3 - 15.4 %   Platelets 260 150 - 450 x10E3/uL   Neutrophils 54 Not Estab. %   Lymphs 28 Not Estab. %   Monocytes 12 Not Estab. %   Eos 5 Not Estab. %   Basos 1 Not Estab. %   Neutrophils Absolute 3.0 1.4 - 7.0 x10E3/uL   Lymphocytes Absolute 1.5 0.7 - 3.1 x10E3/uL   Monocytes Absolute 0.6 0.1 - 0.9 x10E3/uL   EOS (ABSOLUTE) 0.3 0.0 - 0.4 x10E3/uL   Basophils Absolute 0.0 0.0 - 0.2 x10E3/uL   Immature Granulocytes 0 Not Estab. %   Immature Grans (Abs) 0.0 0.0 - 0.1 x10E3/uL     X-Ray: Nasal bones: No acute findings. Preliminary x-ray reading by Monia Pouch, FNP-C, [facility]. Radiology reading: IMPRESSION: Negative exam.  Electronically Signed   By: Inge Rise M.D.   On: 01/26/2018 09:15  Pertinent labs & imaging results that were available during my care of the patient were reviewed by me and considered in my medical decision making.  Assessment & Plan:  Kaimen was seen today for fell and  hit nose last night.  Diagnoses and all orders for this visit:  Swollen nose -     Cancel: DG Facial Bones Complete; Future  Contusion of nose, initial encounter Negative xray. Ice several times per day, barrier between skin and ice. Wound care. Report any new or worsening symptoms.     Continue all other maintenance medications.  Follow up plan: Return if symptoms worsen or fail to improve.  Educational handout given for contusion   The above assessment and management plan was discussed with the patient. The patient verbalized understanding of and has agreed to the management plan. Patient is aware to call the clinic if symptoms persist or worsen. Patient is aware when to return to the clinic for a  follow-up visit. Patient educated on when it is appropriate to go to the emergency department.   Monia Pouch, FNP-C [facility] [phone]

## 2018-02-01 ENCOUNTER — Ambulatory Visit (INDEPENDENT_AMBULATORY_CARE_PROVIDER_SITE_OTHER): Payer: BLUE CROSS/BLUE SHIELD | Admitting: Family Medicine

## 2018-02-01 ENCOUNTER — Encounter: Payer: Self-pay | Admitting: Family Medicine

## 2018-02-01 VITALS — BP 133/79 | HR 73 | Temp 97.5°F | Ht 68.5 in | Wt 184.8 lb

## 2018-02-01 DIAGNOSIS — K219 Gastro-esophageal reflux disease without esophagitis: Secondary | ICD-10-CM

## 2018-02-01 DIAGNOSIS — E785 Hyperlipidemia, unspecified: Secondary | ICD-10-CM | POA: Diagnosis not present

## 2018-02-01 DIAGNOSIS — E1169 Type 2 diabetes mellitus with other specified complication: Secondary | ICD-10-CM | POA: Diagnosis not present

## 2018-02-01 DIAGNOSIS — R7989 Other specified abnormal findings of blood chemistry: Secondary | ICD-10-CM

## 2018-02-01 DIAGNOSIS — Z0001 Encounter for general adult medical examination with abnormal findings: Secondary | ICD-10-CM | POA: Diagnosis not present

## 2018-02-01 DIAGNOSIS — I10 Essential (primary) hypertension: Secondary | ICD-10-CM

## 2018-02-01 DIAGNOSIS — Z0000 Encounter for general adult medical examination without abnormal findings: Secondary | ICD-10-CM

## 2018-02-01 NOTE — Progress Notes (Signed)
BP 133/79   Pulse 73   Temp (!) 97.5 F (36.4 C) (Oral)   Ht 5' 8.5" (1.74 m)   Wt 184 lb 12.8 oz (83.8 kg)   BMI 27.69 kg/m    Subjective:    Patient ID: Johnny Mathews, male    DOB: 05-Jul-1960, 58 y.o.   MRN: [id number]  HPI: Johnny Mathews is a 58 y.o. male presenting on 02/01/2018 for Annual Exam   HPI Well adult exam and physical and recheck of chronic issues Patient has muscular dystrophy but has been stable.  He denies any other major health issues currently.  He did have a fall where he hit his forehead against the stairs and has some bruising around his nose and his eyes but he says that is all improving, this was 3 or 4 days ago when it happened.  Type 2 diabetes mellitus Patient comes in today for recheck of his diabetes. Patient has been currently taking Victoza but he says is only taking it every other day, he comes in with lab work from his work and his A1c is 5.2, we discussed him possibly coming off of it if he wants to but he would like to keep it every other day because he feels like it is running up sometimes.. Patient is not currently on an ACE inhibitor/ARB. Patient has not seen an ophthalmologist this year. Patient denies any issues with their feet.   Hyperlipidemia Patient is coming in for recheck of his hyperlipidemia. The patient is currently taking pravastatin. They deny any issues with myalgias or history of liver damage from it. They deny any focal numbness or weakness or chest pain.   GERD Patient is currently on Zantac and Nexium.  She denies any major symptoms or abdominal pain or belching or burping. She denies any blood in her stool or lightheadedness or dizziness.   Hypertension Patient is currently on losartan, and their blood pressure today is 133/79. Patient denies any lightheadedness or dizziness. Patient denies headaches, blurred vision, chest pains, shortness of breath, or weakness. Denies any side effects from medication and is content with  current medication.  Patient has a history of low testosterone and feels like it is low again, he has had implantation beads in his hip before for the testosterone but it has been quite some time, he would like to discuss this as an option more.  He feels like his energy is just 0.  Relevant past medical, surgical, family and social history reviewed and updated as indicated. Interim medical history since our last visit reviewed. Allergies and medications reviewed and updated.  Review of Systems  Constitutional: Positive for fatigue. Negative for chills and fever.  HENT: Negative for ear pain and tinnitus.   Eyes: Negative for pain and visual disturbance.  Respiratory: Negative for cough, shortness of breath and wheezing.   Cardiovascular: Negative for chest pain, palpitations and leg swelling.  Gastrointestinal: Negative for abdominal pain, blood in stool, constipation and diarrhea.  Genitourinary: Negative for dysuria and hematuria.  Musculoskeletal: Negative for back pain, gait problem and myalgias.  Skin: Negative for rash.  Neurological: Positive for weakness. Negative for dizziness, light-headedness, numbness and headaches.  Psychiatric/Behavioral: Negative for suicidal ideas.  All other systems reviewed and are negative.   Per HPI unless specifically indicated above   Allergies as of 02/01/2018      Reactions   Eggs Or Egg-derived Products Swelling   Throat   Septra [sulfamethoxazole-trimethoprim] Swelling   throat  Sulfonamide Derivatives    REACTION: hives      Medication List       Accurate as of February 01, 2018 10:36 AM. Always use your most recent med list.        albuterol 108 (90 Base) MCG/ACT inhaler Commonly known as:  PROAIR HFA Inhale 2 puffs into the lungs every 4 (four) hours as needed for wheezing or shortness of breath.   amitriptyline 25 MG tablet Commonly known as:  ELAVIL Take 3 tablets (75 mg total) by mouth at bedtime.   azelastine 0.1 %  nasal spray Commonly known as:  ASTELIN USE 2 SPRAYS IN EACH NOSTRIL AT BEDTIME   CONTOUR BLOOD GLUCOSE SYSTEM w/Device Kit Test blood sugars four times daily   EPINEPHrine 0.3 mg/0.3 mL Devi Commonly known as:  EPI-PEN Inject 0.3 mg into the skin as needed.   esomeprazole 40 MG capsule Commonly known as:  NEXIUM TAKE ONE (1) CAPSULE EACH DAY   finasteride 5 MG tablet Commonly known as:  PROSCAR Take 1 tablet (5 mg total) by mouth daily.   fluticasone 50 MCG/ACT nasal spray Commonly known as:  FLONASE Place 1-2 sprays into both nostrils daily.   furosemide 20 MG tablet Commonly known as:  LASIX Take 1 tablet (20 mg total) by mouth daily as needed.   glucose blood test strip Commonly known as:  CONTOUR NEXT TEST Test blood sugars four times daily   liraglutide 18 MG/3ML Sopn Commonly known as:  VICTOZA INJECT 0.3ML SQ DAILY   losartan 50 MG tablet Commonly known as:  COZAAR TAKE ONE (1) TABLET EACH DAY   Meclizine HCl 25 MG Chew Chew 1 tablet (25 mg total) by mouth at bedtime.   mometasone-formoterol 100-5 MCG/ACT Aero Commonly known as:  DULERA USE 2 PUFFS TWICE DAILY   montelukast 10 MG tablet Commonly known as:  SINGULAIR TAKE ONE (1) TABLET EACH DAY   naproxen 500 MG tablet Commonly known as:  NAPROSYN Take 1 tablet (500 mg total) by mouth 2 (two) times daily with a meal.   pravastatin 80 MG tablet Commonly known as:  PRAVACHOL Take 1 tablet (80 mg total) by mouth daily.   ranitidine 150 MG tablet Commonly known as:  ZANTAC Take 1 tablet (150 mg total) by mouth 2 (two) times daily.   tadalafil 20 MG tablet Commonly known as:  ADCIRCA/CIALIS Take 0.5-1 tablets (10-20 mg total) by mouth every other day as needed for erectile dysfunction.   tamsulosin 0.4 MG Caps capsule Commonly known as:  FLOMAX TAKE ONE (1) CAPSULE EACH DAY   VYVANSE 30 MG capsule Generic drug:  lisdexamfetamine Take 1 tablet by mouth 2 (two) times daily.            Objective:    BP 133/79   Pulse 73   Temp (!) 97.5 F (36.4 C) (Oral)   Ht 5' 8.5" (1.74 m)   Wt 184 lb 12.8 oz (83.8 kg)   BMI 27.69 kg/m   Wt Readings from Last 3 Encounters:  02/01/18 184 lb 12.8 oz (83.8 kg)  01/26/18 180 lb (81.6 kg)  01/19/18 179 lb (81.2 kg)    Physical Exam Vitals signs and nursing note reviewed.  Constitutional:      General: He is not in acute distress.    Appearance: He is well-developed. He is not diaphoretic.  Eyes:     General: No scleral icterus.    Conjunctiva/sclera: Conjunctivae normal.  Neck:     Musculoskeletal: Neck supple.  Thyroid: No thyromegaly.  Cardiovascular:     Rate and Rhythm: Normal rate and regular rhythm.     Heart sounds: Normal heart sounds. No murmur.  Pulmonary:     Effort: Pulmonary effort is normal. No respiratory distress.     Breath sounds: Normal breath sounds. No wheezing.  Musculoskeletal: Normal range of motion.  Lymphadenopathy:     Cervical: No cervical adenopathy.  Skin:    General: Skin is warm and dry.     Findings: No rash.  Neurological:     Mental Status: He is alert and oriented to person, place, and time.     Coordination: Coordination normal.  Psychiatric:        Behavior: Behavior normal.         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  His LDL cholesterol  was 99 and triglycerides of 92 and total cholesterol of 206 and HDL of 92 Follow up plan: Return in about 6 months (around 08/02/2018), or if symptoms worsen or fail to improve, for Recheck diabetes and hypertension and cholesterol.  Counseling provided for all of the vaccine components No orders of the defined types were placed in this encounter.   Caryl Pina, MD [facility] 02/01/2018, 10:36 AM

## 2018-03-20 ENCOUNTER — Other Ambulatory Visit: Payer: Self-pay | Admitting: Family Medicine

## 2018-03-23 ENCOUNTER — Encounter: Payer: Self-pay | Admitting: "Endocrinology

## 2018-03-23 ENCOUNTER — Ambulatory Visit (INDEPENDENT_AMBULATORY_CARE_PROVIDER_SITE_OTHER): Payer: BLUE CROSS/BLUE SHIELD | Admitting: "Endocrinology

## 2018-03-23 ENCOUNTER — Other Ambulatory Visit: Payer: Self-pay

## 2018-03-23 VITALS — BP 131/84 | HR 89 | Ht 68.5 in | Wt 188.0 lb

## 2018-03-23 DIAGNOSIS — E291 Testicular hypofunction: Secondary | ICD-10-CM

## 2018-03-23 NOTE — Progress Notes (Signed)
Endocrinology Consult Note   Johnny Mathews, 58 y.o., male   Chief Complaint  Patient presents with  . Establish Care    Low testosterone     Past Medical History:  Diagnosis Date  . Alpha galactosidase deficiency   . Asthma   . BPH (benign prostatic hypertrophy)   . Diabetes mellitus without complication ([place])   . GERD (gastroesophageal reflux disease)   . Hyperlipidemia   . Muscular dystrophy [facility])    Past Surgical History:  Procedure Laterality Date  . KIDNEY STONE SURGERY    . KNEE SURGERY Right   . SHOULDER OPEN ROTATOR CUFF REPAIR     Social History   Socioeconomic History  . Marital status: Married    Spouse name: Not on file  . Number of children: Not on file  . Years of education: Not on file  . Highest education level: Not on file  Occupational History  . Occupation: Physiological scientist  Social Needs  . Financial resource strain: Not on file  . Food insecurity:    Worry: Not on file    Inability: Not on file  . Transportation needs:    Medical: Not on file    Non-medical: Not on file  Tobacco Use  . Smoking status: Former Smoker    Packs/day: 1.00    Years: 10.00    Pack years: 10.00    Types: Cigarettes    Last attempt to quit: 01/06/1994    Years since quitting: 24.2  . Smokeless tobacco: Never Used  Substance and Sexual Activity  . Alcohol use: Yes    Alcohol/week: 0.0 standard drinks    Comment: social  . Drug use: No  . Sexual activity: Not on file  Lifestyle  . Physical activity:    Days per week: Not on file    Minutes per session: Not on file  . Stress: Not on file  Relationships  . Social connections:    Talks on phone: Not on file    Gets together: Not on file    Attends religious service: Not on file    Active member of club or organization: Not on file    Attends meetings of clubs or organizations: Not on file    Relationship status: Not on file  Other Topics Concern  . Not on  file  Social History Narrative  . Not on file   Outpatient Encounter Medications as of 03/23/2018  Medication Sig  . albuterol (PROAIR HFA) 108 (90 Base) MCG/ACT inhaler Inhale 2 puffs into the lungs every 4 (four) hours as needed for wheezing or shortness of breath.  Marland Kitchen amitriptyline (ELAVIL) 25 MG tablet Take 3 tablets (75 mg total) by mouth at bedtime.  Marland Kitchen azelastine (ASTELIN) 0.1 % nasal spray USE 2 SPRAYS IN EACH NOSTRIL AT BEDTIME  . Blood Glucose Monitoring Suppl (CONTOUR BLOOD GLUCOSE SYSTEM) w/Device KIT Test blood sugars four times daily  . EPINEPHrine (EPI-PEN) 0.3 mg/0.3 mL DEVI Inject 0.3 mg into the skin as needed.   Marland Kitchen esomeprazole (NEXIUM) 40 MG capsule TAKE ONE (1) CAPSULE EACH DAY  . finasteride (PROSCAR) 5 MG tablet Take 1 tablet (5 mg total) by mouth daily.  . fluticasone (FLONASE) 50 MCG/ACT nasal spray Place 1-2 sprays into both nostrils daily.  . furosemide (LASIX) 20 MG tablet Take 1 tablet (20 mg total) by mouth daily as needed.  Marland Kitchen glucose blood (CONTOUR NEXT TEST) test strip Test blood sugars four times daily  . liraglutide (VICTOZA) 18 MG/3ML SOPN  INJECT 0.3ML SQ DAILY  . losartan (COZAAR) 50 MG tablet TAKE ONE (1) TABLET EACH DAY  . Meclizine HCl 25 MG CHEW Chew 1 tablet (25 mg total) by mouth at bedtime.  . mometasone-formoterol (DULERA) 100-5 MCG/ACT AERO USE 2 PUFFS TWICE DAILY  . montelukast (SINGULAIR) 10 MG tablet TAKE ONE (1) TABLET EACH DAY  . naproxen (NAPROSYN) 500 MG tablet Take 1 tablet (500 mg total) by mouth 2 (two) times daily with a meal.  . pravastatin (PRAVACHOL) 80 MG tablet Take 1 tablet (80 mg total) by mouth daily.  . ranitidine (ZANTAC) 150 MG tablet Take 1 tablet (150 mg total) by mouth 2 (two) times daily.  . tadalafil (ADCIRCA/CIALIS) 20 MG tablet Take 0.5-1 tablets (10-20 mg total) by mouth every other day as needed for erectile dysfunction.  . tamsulosin (FLOMAX) 0.4 MG CAPS capsule TAKE ONE (1) CAPSULE EACH DAY  . VYVANSE 30 MG capsule  Take 1 tablet by mouth 2 (two) times daily.   No facility-administered encounter medications on file as of 03/23/2018.    ALLERGIES: Allergies  Allergen Reactions  . Eggs Or Egg-Derived Products Swelling    Throat  . Septra [Sulfamethoxazole-Trimethoprim] Swelling    throat  . Sulfonamide Derivatives     REACTION: hives    VACCINATION STATUS: Immunization History  Administered Date(s) Administered  . Influenza, Quadrivalent, Recombinant, Inj, Pf 01/15/2017    HPI: Johnny Mathews is a 58 y.o.-year-old man, being seen in consultation for evaluation and management of low testosterone requested by by his  Dettinger, Fransisca Kaufmann, MD.  He reports that he has hypogonadism for as long as he remembers, does not father any biological children although he adopted 3 kids.   -He was treated with testosterone pellets in a clinic in [place] on 2 separate occasions first 1 2 years ago and last one 1 year ago, the dose in the details are not available to review.  He states that most recent labs show testosterone still low, not available to review.  He admits for decreased libido. Has difficulty obtaining or maintaining an erection. He denies  trauma to testes,  chemotherapy,  testicular irradiation,  nor genitourinary surgery. No h/o cryptorchidism.  He reports medical history of alpha galactosidase deficiency and muscular dystrophy.   He denies history of  mumps orchitis/ history of  autoimmune disorders. - He grew and went through puberty like his peers, lately however he has noticed his shaving frequency decreased significantly, shaves every 2 to 3 weeks.      No breast discomfort/gynecomastia. No abnormal sense of smell .  Reports episodes of hot flashes, no visual problems.   No report of changing headaches.  No Family history of hemochromatosis or pituitary tumors. No recent rapid weight change.  He has type 2 diabetes on treatment with Victoza.  His most recent A1c was 5.2% on January 19, 2018. -At the same time his labs showed PSA of <0.1 (history of BPH), hematocrit 41.3.  No history of sleep apnea.  He has asthma on various inhalers. No chronic pain. Not on opiates, does not take steroids.  No more than 2 drinks a day of alcohol at a time, and this is rarely. No anabolic steroids use. No herbal medicines. Not on antidepressants.  He does not have family  Or personal history of premature  cardiac disease.   ROS: Constitutional: + Aggressive weight gain, + fatigue, no subjective hyperthermia/hypothermia Eyes: no blurry vision, no xerophthalmia ENT: no sore throat, no nodules  palpated in throat, no dysphagia/odynophagia, no hoarseness Cardiovascular: no CP/SOB/palpitations/leg swelling Respiratory: + cough/SOB Gastrointestinal: no N/V/D/C Musculoskeletal: no muscle/joint aches Skin: no rashes Neurological: no tremors/numbness/tingling/dizziness Psychiatric: no depression/anxiety  PE: BP 131/84   Pulse 89   Ht 5' 8.5" (1.74 m)   Wt 188 lb (85.3 kg)   BMI 28.17 kg/m  Wt Readings from Last 3 Encounters:  03/23/18 188 lb (85.3 kg)  02/01/18 184 lb 12.8 oz (83.8 kg)  01/26/18 180 lb (81.6 kg)   Constitutional: overweight, in NAD Eyes: PERRLA, EOMI, no exophthalmos ENT: moist mucous membranes, no thyromegaly, no cervical lymphadenopathy Cardiovascular: RRR, No MRG Respiratory: CTA B Gastrointestinal: abdomen soft, NT, ND, BS+ Musculoskeletal: no deformities, strength intact in all 4 Skin: moist, warm, no rashes Neurological: no tremor with outstretched hands, DTR normal in all 4 Genital exam: normal male escutcheon, no inguinal LAD, normal phallus, significantly shrunk testes bilaterally to 5 mL, no testicular or scrotal mass lesions.  no penile discharge.  No gynecomastia.   CMP ( most recent) CMP     Component Value Date/Time   NA 141 01/15/2017 1237   K 4.3 01/15/2017 1237   CL 97 01/15/2017 1237   CO2 25 01/15/2017 1237   GLUCOSE 75 01/15/2017 1237    GLUCOSE 90 03/31/2009 0342   BUN 19 01/15/2017 1237   CREATININE 1.11 01/15/2017 1237   CALCIUM 9.8 01/15/2017 1237   PROT 7.7 01/15/2017 1237   ALBUMIN 5.0 01/15/2017 1237   AST 27 01/15/2017 1237   ALT 30 01/15/2017 1237   ALKPHOS 125 (H) 01/15/2017 1237   BILITOT 0.4 01/15/2017 1237   GFRNONAA 73 01/15/2017 1237   GFRAA 84 01/15/2017 1237     Diabetic Labs (most recent): Lab Results  Component Value Date   HGBA1C 5.0 01/15/2017   HGBA1C 5.2 12/11/2015   HGBA1C 5.2 07/31/2015     Lipid Panel ( most recent) Lipid Panel     Component Value Date/Time   CHOL 202 (H) 01/15/2017 1237   TRIG 63 01/15/2017 1237   HDL 89 01/15/2017 1237   CHOLHDL 2.3 01/15/2017 1237   CHOLHDL 4.8 03/30/2009 1807   VLDL 20 03/30/2009 1807   LDLCALC 100 (H) 01/15/2017 1237     Results for BENARD, MINTURN (MRN [id number]) as of 03/23/2018 17:02  Ref. Range 06/18/2015 15:22 01/15/2017 12:37 06/03/2017 10:04  TSH Latest Ref Range: 0.450 - 4.500 uIU/mL 1.510 1.200 2.040  Thyroxine (T4) Latest Ref Range: 4.5 - 12.0 ug/dL 4.8    Free Thyroxine Index Latest Ref Range: 1.2 - 4.9  1.3    T3 Uptake Ratio Latest Ref Range: 24 - 39 % 27       ASSESSMENT:  1. Hypogonadism  PLAN:  I have examined the patient, reviewed his labs and have had a long discussion with the patient regarding his testosterone results.  Patient likely has longstanding hypogonadism, the details of his initial diagnosis are not available to review. - I have discussed potential causes of hypogonadism, diagnosis of hypogonadism,  and relevant workup to confirm the diagnosis of hypogonadism before initiating testosterone replacement therapy.  -It is beneficial to determine etiology of hypogonadism before initiation of treatment if possible. -I have approached him for more complete laboratory work including repeat of testosterone (total and free), SHBG; FSH/LH; prolactin,  in a morning sample of serum.    -He will be considered for  testosterone supplement utilizing IM or topical preparations instead of testosterone pellets. -  I also discussed adverse effects  of unnecessary testosterone replacement short-term and long-term.  -If this workup indicates  Secondary etiology for hypogonadism, he will be considered for MRI imaging of sella/pituitary .  - In this particular patient with bilaterally shrunk testes as well as no biological children, he likely had  chronic hypogonadism.  He understands his chance of fertility is close to 0.  -Given his medical history of sleep apnea and BPH which are relative contraindications for testosterone replacement, if necessary, he will be initiated with the lowest dose of testosterone and titrate based on his response.  -He is already on PDE 5 inhibitors for ED.  -No prescription was initiated today.   - Time spent with the patient: 45 minutes, of which >50% was spent in obtaining information about his symptoms, reviewing his previous labs, evaluations, and treatments, counseling him about his chronic hypogonadism, and developing a plan to confirm the diagnosis and long term treatment as necessary.  Johnny Mathews participated in the discussions, expressed understanding, and voiced agreement with the above plans.  All questions were answered to his satisfaction. he is encouraged to contact clinic should he have any questions or concerns prior to his return visit.  Return Labs fasting before [place], MD [facility] [address] [place], [place] [address] Phone: [phone]  Fax: [phone]   03/23/2018, 4:51 PM  This note was partially dictated with voice recognition software. Similar sounding words can be transcribed inadequately or may not  be corrected upon review.

## 2018-04-02 DIAGNOSIS — E291 Testicular hypofunction: Secondary | ICD-10-CM | POA: Diagnosis not present

## 2018-04-03 LAB — TESTOSTERONE, FREE, TOTAL, SHBG
Sex Hormone Binding: 83.6 nmol/L — ABNORMAL HIGH (ref 19.3–76.4)
TESTOSTERONE FREE: 2.8 pg/mL — AB (ref 7.2–24.0)
Testosterone: 32 ng/dL — ABNORMAL LOW (ref 264–916)

## 2018-04-07 ENCOUNTER — Ambulatory Visit: Payer: BLUE CROSS/BLUE SHIELD | Admitting: "Endocrinology

## 2018-04-26 ENCOUNTER — Other Ambulatory Visit: Payer: Self-pay | Admitting: Family Medicine

## 2018-05-11 ENCOUNTER — Ambulatory Visit: Payer: BLUE CROSS/BLUE SHIELD | Admitting: "Endocrinology

## 2018-05-12 ENCOUNTER — Ambulatory Visit (INDEPENDENT_AMBULATORY_CARE_PROVIDER_SITE_OTHER): Payer: BLUE CROSS/BLUE SHIELD | Admitting: "Endocrinology

## 2018-05-12 ENCOUNTER — Encounter: Payer: Self-pay | Admitting: "Endocrinology

## 2018-05-12 ENCOUNTER — Other Ambulatory Visit: Payer: Self-pay

## 2018-05-12 DIAGNOSIS — E119 Type 2 diabetes mellitus without complications: Secondary | ICD-10-CM

## 2018-05-12 DIAGNOSIS — E291 Testicular hypofunction: Secondary | ICD-10-CM

## 2018-05-12 MED ORDER — TESTOSTERONE 20.25 MG/1.25GM (1.62%) TD GEL: 20.2500 mg | TRANSDERMAL | 2 refills | Status: DC

## 2018-05-12 NOTE — Progress Notes (Signed)
05/12/2018                                Endocrinology Telehealth Visit Follow up Note -During COVID -19 Pandemic  I connected with Johnny Mathews on 05/12/2018   by telephone and verified that I am speaking with the correct person using two identifiers. Johnny Mathews, [birth year]. he has verbally consented to this visit. All issues noted in this document were discussed and addressed. The format was not optimal for physical exam.    Johnny Mathews, 58 y.o., male   No chief complaint on file.    Past Medical History:  Diagnosis Date  . Alpha galactosidase deficiency   . Asthma   . BPH (benign prostatic hypertrophy)   . Diabetes mellitus without complication ([place])   . GERD (gastroesophageal reflux disease)   . Hyperlipidemia   . Muscular dystrophy [facility])    Past Surgical History:  Procedure Laterality Date  . KIDNEY STONE SURGERY    . KNEE SURGERY Right   . SHOULDER OPEN ROTATOR CUFF REPAIR     Social History   Socioeconomic History  . Marital status: Married    Spouse name: Not on file  . Number of children: Not on file  . Years of education: Not on file  . Highest education level: Not on file  Occupational History  . Occupation: Physiological scientist  Social Needs  . Financial resource strain: Not on file  . Food insecurity:    Worry: Not on file    Inability: Not on file  . Transportation needs:    Medical: Not on file    Non-medical: Not on file  Tobacco Use  . Smoking status: Former Smoker    Packs/day: 1.00    Years: 10.00    Pack years: 10.00    Types: Cigarettes    Last attempt to quit: 01/06/1994    Years since quitting: 24.3  . Smokeless tobacco: Never Used  Substance and Sexual Activity  . Alcohol use: Yes    Alcohol/week: 0.0 standard drinks    Comment: social  . Drug use: No  . Sexual activity: Not on file  Lifestyle  . Physical activity:    Days per week: Not on file    Minutes per session: Not on file   . Stress: Not on file  Relationships  . Social connections:    Talks on phone: Not on file    Gets together: Not on file    Attends religious service: Not on file    Active member of club or organization: Not on file    Attends meetings of clubs or organizations: Not on file    Relationship status: Not on file  Other Topics Concern  . Not on file  Social History Narrative  . Not on file   Outpatient Encounter Medications as of 05/12/2018  Medication Sig  . albuterol (PROAIR HFA) 108 (90 Base) MCG/ACT inhaler Inhale 2 puffs into the lungs every 4 (four) hours as needed for wheezing or shortness of breath.  Marland Kitchen amitriptyline (ELAVIL) 25 MG tablet Take 3 tablets (75 mg total) by mouth at bedtime.  Marland Kitchen azelastine (ASTELIN) 0.1 % nasal spray USE 2 SPRAYS IN EACH NOSTRIL AT BEDTIME  . Blood Glucose Monitoring Suppl (CONTOUR BLOOD GLUCOSE SYSTEM) w/Device KIT Test blood sugars four times daily  . EPINEPHrine (EPI-PEN) 0.3 mg/0.3 mL DEVI Inject 0.3 mg into the skin as needed.   Marland Kitchen  esomeprazole (NEXIUM) 40 MG capsule TAKE ONE (1) CAPSULE EACH DAY  . finasteride (PROSCAR) 5 MG tablet Take 1 tablet (5 mg total) by mouth daily.  . fluticasone (FLONASE) 50 MCG/ACT nasal spray Place 1-2 sprays into both nostrils daily.  . furosemide (LASIX) 20 MG tablet Take 1 tablet (20 mg total) by mouth daily as needed.  Marland Kitchen glucose blood (CONTOUR NEXT TEST) test strip Test blood sugars four times daily  . liraglutide (VICTOZA) 18 MG/3ML SOPN INJECT 0.3ML SQ DAILY  . losartan (COZAAR) 50 MG tablet TAKE ONE (1) TABLET EACH DAY  . Meclizine HCl 25 MG CHEW Chew 1 tablet (25 mg total) by mouth at bedtime.  . mometasone-formoterol (DULERA) 100-5 MCG/ACT AERO USE 2 PUFFS TWICE DAILY  . montelukast (SINGULAIR) 10 MG tablet TAKE ONE (1) TABLET EACH DAY  . naproxen (NAPROSYN) 500 MG tablet Take 1 tablet (500 mg total) by mouth 2 (two) times daily with a meal.  . pravastatin (PRAVACHOL) 80 MG tablet Take 1 tablet (80 mg total)  by mouth daily.  . ranitidine (ZANTAC) 150 MG tablet Take 1 tablet (150 mg total) by mouth 2 (two) times daily.  . tadalafil (ADCIRCA/CIALIS) 20 MG tablet Take 0.5-1 tablets (10-20 mg total) by mouth every other day as needed for erectile dysfunction.  . tamsulosin (FLOMAX) 0.4 MG CAPS capsule TAKE ONE (1) CAPSULE EACH DAY  . Testosterone 20.25 MG/1.25GM (1.62%) GEL Apply 20.25 mg topically every morning.  Marland Kitchen VYVANSE 30 MG capsule Take 1 tablet by mouth 2 (two) times daily.   No facility-administered encounter medications on file as of 05/12/2018.    ALLERGIES: Allergies  Allergen Reactions  . Eggs Or Egg-Derived Products Swelling    Throat  . Septra [Sulfamethoxazole-Trimethoprim] Swelling    throat  . Sulfonamide Derivatives     REACTION: hives    VACCINATION STATUS: Immunization History  Administered Date(s) Administered  . Influenza, Quadrivalent, Recombinant, Inj, Pf 01/15/2017    HPI: Johnny Mathews is a 58 y.o.-year-old man.  He was seen in March 2020 in consultation for hypogonadism requested by his PMD  Dettinger, Fransisca Kaufmann, MD.  He reports that he has hypogonadism for as long as he remembers, does not father any biological children although he adopted 3 kids.   -He was treated with testosterone pellets in a clinic in [place] on 2 separate occasions first ,  2 years ago and last one 1 year ago, the dose in the details are not available to review.   He was sent for repeat labs during his last visit, confirming profound found hypogonadism.  He admits for decreased libido. Has difficulty obtaining or maintaining an erection. He denies  trauma to testes,  chemotherapy,  testicular irradiation,  nor genitourinary surgery. Denies new complaints since last visit.  He grew and went through puberty like his peers, lately however he has noticed his shaving frequency decreased significantly, shaves every 2 to 3 weeks.      No breast discomfort/gynecomastia. No abnormal sense of smell  .  Reports episodes of hot flashes, no visual problems.   No report of changing headaches.  No Family history of hemochromatosis or pituitary tumors. No recent rapid weight change.     He has type 2 diabetes on treatment with Victoza.  His most recent A1c was 5.2% on January 19, 2018. -At the same time his labs showed PSA of <0.1 (history of BPH), hematocrit 41.3.  No history of sleep apnea.  He has asthma on various inhalers.  No chronic pain. Not on opiates, does not take steroids.  No more than 2 drinks a day of alcohol at a time, and this is rarely. No anabolic steroids use. No herbal medicines. Not on antidepressants.  He does not have family  Or personal history of premature  cardiac disease.  His most recent labs show total testosterone of 32.   ROS: Limited as above.  PE: There were no vitals taken for this visit. Wt Readings from Last 3 Encounters:  03/23/18 188 lb (85.3 kg)  02/01/18 184 lb 12.8 oz (83.8 kg)  01/26/18 180 lb (81.6 kg)    Genital exam: normal male escutcheon, no inguinal LAD, normal phallus, significantly shrunk testes bilaterally to 5 mL, no testicular or scrotal mass lesions.  no penile discharge.  No gynecomastia.   CMP ( most recent) CMP     Component Value Date/Time   NA 141 01/15/2017 1237   K 4.3 01/15/2017 1237   CL 97 01/15/2017 1237   CO2 25 01/15/2017 1237   GLUCOSE 75 01/15/2017 1237   GLUCOSE 90 03/31/2009 0342   BUN 19 01/15/2017 1237   CREATININE 1.11 01/15/2017 1237   CALCIUM 9.8 01/15/2017 1237   PROT 7.7 01/15/2017 1237   ALBUMIN 5.0 01/15/2017 1237   AST 27 01/15/2017 1237   ALT 30 01/15/2017 1237   ALKPHOS 125 (H) 01/15/2017 1237   BILITOT 0.4 01/15/2017 1237   GFRNONAA 73 01/15/2017 1237   GFRAA 84 01/15/2017 1237     Diabetic Labs (most recent): Lab Results  Component Value Date   HGBA1C 5.0 01/15/2017   HGBA1C 5.2 12/11/2015   HGBA1C 5.2 07/31/2015     Lipid Panel ( most recent) Lipid Panel     Component  Value Date/Time   CHOL 202 (H) 01/15/2017 1237   TRIG 63 01/15/2017 1237   HDL 89 01/15/2017 1237   CHOLHDL 2.3 01/15/2017 1237   CHOLHDL 4.8 03/30/2009 1807   VLDL 20 03/30/2009 1807   LDLCALC 100 (H) 01/15/2017 1237     Results for CHAYSON, CHARTERS (MRN [id number]) as of 03/23/2018 17:02  Ref. Range 06/18/2015 15:22 01/15/2017 12:37 06/03/2017 10:04  TSH Latest Ref Range: 0.450 - 4.500 uIU/mL 1.510 1.200 2.040  Thyroxine (T4) Latest Ref Range: 4.5 - 12.0 ug/dL 4.8    Free Thyroxine Index Latest Ref Range: 1.2 - 4.9  1.3    T3 Uptake Ratio Latest Ref Range: 24 - 39 % 27     Recent Results (from the past 2160 hour(s))  Testosterone, Free, Total, SHBG     Status: Abnormal   Collection Time: 04/02/18  8:06 AM  Result Value Ref Range   Testosterone 32 (L) 264 - 916 ng/dL    Comment: Adult male reference interval is based on a population of healthy nonobese males (BMI <30) between 23 and 57 years old. [place], [place] [phone]. PMID: [id number].    Testosterone, Free 2.8 (L) 7.2 - 24.0 pg/mL   Sex Hormone Binding 83.6 (H) 19.3 - 76.4 nmol/L     ASSESSMENT:  1. Hypogonadism 2.  Type 2 diabetes  PLAN:  IPatient likely has longstanding hypogonadism, measured along with FSH, LH, prolactin would have been helpful.   -He wishes to be initiated on testosterone replacement therapy.  Notes from prior consult.   -He will be considered for testosterone supplement utilizing  topical preparations instead of testosterone pellets.  -He is in agreement to initiate AndroGel 20.25 mg topically every day at 8 AM on alternating  shoulders with plan to repeat total testosterone, CBC, PSA in 3 months with office visit. -  I also discussed adverse effects of unnecessary testosterone replacement short-term and long-term.    - In this particular patient with bilaterally shrunk testes as well as no biological children, he likely had  chronic hypogonadism.  He understands his chance of  fertility is close to 0.   -Given his medical history of sleep apnea and BPH which are relative contraindications for testosterone replacement, his testosterone dose will be titrated slowly based on his clinical response.    -He is already on PDE 5 inhibitors for ED.   When he recent A1c of 5%, he will be considered for treatment without medications for diabetes on his subsequent visit.    Time for this visit 15 minutes.  Johnny Mathews participated in the discussions, expressed understanding, and voiced agreement with the above plans.  All questions were answered to his satisfaction. he is encouraged to contact clinic should he have any questions or concerns prior to his return visit.   Return in about 3 months (around 08/12/2018), or I will sign the RX tomorrow. , for Follow up with Pre-visit Labs.  Glade Lloyd, MD [facility] [address] [place], [place] [address] Phone: [phone]  Fax: [phone]   05/12/2018, 2:09 PM  This note was partially dictated with voice recognition software. Similar sounding words can be transcribed inadequately or may not  be corrected upon review.

## 2018-05-17 ENCOUNTER — Ambulatory Visit: Payer: BLUE CROSS/BLUE SHIELD | Admitting: "Endocrinology

## 2018-05-20 ENCOUNTER — Other Ambulatory Visit: Payer: Self-pay | Admitting: Family Medicine

## 2018-05-28 ENCOUNTER — Telehealth: Payer: Self-pay | Admitting: Family Medicine

## 2018-06-02 ENCOUNTER — Other Ambulatory Visit: Payer: Self-pay | Admitting: *Deleted

## 2018-06-02 MED ORDER — MECLIZINE HCL 25 MG PO CHEW: 1.0000 | CHEWABLE_TABLET | Freq: Every day | ORAL | 1 refills | Status: DC

## 2018-06-07 DIAGNOSIS — L03119 Cellulitis of unspecified part of limb: Secondary | ICD-10-CM | POA: Diagnosis not present

## 2018-06-07 DIAGNOSIS — L02419 Cutaneous abscess of limb, unspecified: Secondary | ICD-10-CM | POA: Diagnosis not present

## 2018-06-13 ENCOUNTER — Other Ambulatory Visit: Payer: Self-pay | Admitting: Family Medicine

## 2018-06-14 ENCOUNTER — Encounter: Payer: Self-pay | Admitting: Family Medicine

## 2018-06-15 NOTE — Telephone Encounter (Signed)
Pt is following up on the message he sent yesterday wants to know what to do

## 2018-06-16 ENCOUNTER — Other Ambulatory Visit: Payer: Self-pay

## 2018-06-16 ENCOUNTER — Ambulatory Visit: Payer: BC Managed Care – PPO | Admitting: Family Medicine

## 2018-06-16 ENCOUNTER — Encounter: Payer: Self-pay | Admitting: Family Medicine

## 2018-06-16 VITALS — BP 119/68 | HR 77 | Temp 96.9°F | Ht 68.0 in | Wt 188.0 lb

## 2018-06-16 DIAGNOSIS — L02439 Carbuncle of limb, unspecified: Secondary | ICD-10-CM

## 2018-06-16 DIAGNOSIS — L02429 Furuncle of limb, unspecified: Secondary | ICD-10-CM

## 2018-06-16 MED ORDER — DOXYCYCLINE HYCLATE 100 MG PO CAPS: 100.0000 mg | ORAL_CAPSULE | Freq: Two times a day (BID) | ORAL | 0 refills | Status: DC

## 2018-06-16 NOTE — Progress Notes (Signed)
Chief Complaint  Patient presents with  . 2 bites on leg    Was seen while on vacation and they opened it but he wants it checked    HPI  Patient presents today for evaluation of leg wound. Was I&D'ed while out of town several days ago. In today because pain & redness persist.A second papule has developed distally.   PMH: Smoking status noted ROS: Per HPI  Objective: BP 119/68   Pulse 77   Temp (!) 96.9 F (36.1 C) (Oral)   Ht 5\' 8"  (1.727 m)   Wt 188 lb (85.3 kg)   BMI 28.59 kg/m  Gen: NAD, alert, cooperative with exam HEENT: NCAT, EOMI, PERRL Ext: two areas of erythema noted right posterior leg. One is red, flat with central healing lanced area. The other is red, papular 8 mm, no head or fluctuance. Neuro: Alert and oriented, No gross deficits  Assessment and plan:  1. Carbuncle and furuncle of leg     Meds ordered this encounter  Medications  . doxycycline (VIBRAMYCIN) 100 MG capsule    Sig: Take 1 capsule (100 mg total) by mouth 2 (two) times daily.    Dispense:  20 capsule    Refill:  0    No orders of the defined types were placed in this encounter.   Follow up as needed.  [name], MD

## 2018-06-21 ENCOUNTER — Other Ambulatory Visit: Payer: Self-pay | Admitting: Family Medicine

## 2018-07-01 ENCOUNTER — Other Ambulatory Visit: Payer: Self-pay | Admitting: Family Medicine

## 2018-07-01 DIAGNOSIS — E119 Type 2 diabetes mellitus without complications: Secondary | ICD-10-CM

## 2018-07-01 DIAGNOSIS — E785 Hyperlipidemia, unspecified: Secondary | ICD-10-CM

## 2018-07-09 ENCOUNTER — Other Ambulatory Visit: Payer: Self-pay | Admitting: Family Medicine

## 2018-07-14 ENCOUNTER — Other Ambulatory Visit: Payer: Self-pay

## 2018-07-14 ENCOUNTER — Ambulatory Visit: Payer: BC Managed Care – PPO | Admitting: Family Medicine

## 2018-07-14 ENCOUNTER — Encounter: Payer: Self-pay | Admitting: Family Medicine

## 2018-07-14 VITALS — BP 136/82 | HR 81 | Temp 98.7°F | Ht 68.0 in | Wt 194.6 lb

## 2018-07-14 DIAGNOSIS — M7130 Other bursal cyst, unspecified site: Secondary | ICD-10-CM

## 2018-07-14 DIAGNOSIS — D485 Neoplasm of uncertain behavior of skin: Secondary | ICD-10-CM | POA: Diagnosis not present

## 2018-07-14 NOTE — Progress Notes (Addendum)
Subjective:   Patient ID: Johnny Mathews, male    DOB: 11-Aug-1960, 58 y.o.   MRN: [id number]  HPI: Johnny Mathews is a 58 y.o. male presenting on 07/14/2018 for lump on middle finger of right hand  Patient c/o a lump to the right middle finger x3 weeks that is getting larger and causing more pain. He states the mornings are not that bad but the evenings are worse with a pain rating 6/10. He denies any injury to the area. States he had a spider bite on his leg that got infected around time that the lump on his finger appeared.    ROS: Negative unless specifically indicated above in HPI.   Relevant past medical and surgical history reviewed and updated as indicated.  Allergies and medications reviewed and updated.   Current Outpatient Medications:  .  albuterol (PROAIR HFA) 108 (90 Base) MCG/ACT inhaler, Inhale 2 puffs into the lungs every 4 (four) hours as needed for wheezing or shortness of breath., Disp: 1 Inhaler, Rfl: 4 .  amitriptyline (ELAVIL) 25 MG tablet, Take 3 tablets (75 mg total) by mouth at bedtime., Disp: 270 tablet, Rfl: 3 .  azelastine (ASTELIN) 0.1 % nasal spray, USE 2 SPRAYS IN EACH NOSTRIL AT BEDTIME, Disp: 30 mL, Rfl: 2 .  Blood Glucose Monitoring Suppl (CONTOUR BLOOD GLUCOSE SYSTEM) w/Device KIT, Test blood sugars four times daily, Disp: 200 each, Rfl: 5 .  EPINEPHrine (EPI-PEN) 0.3 mg/0.3 mL DEVI, Inject 0.3 mg into the skin as needed. , Disp: , Rfl:  .  esomeprazole (NEXIUM) 40 MG capsule, TAKE ONE (1) CAPSULE EACH DAY, Disp: 90 capsule, Rfl: 0 .  finasteride (PROSCAR) 5 MG tablet, Take 1 tablet (5 mg total) by mouth daily., Disp: 90 tablet, Rfl: 3 .  fluticasone (FLONASE) 50 MCG/ACT nasal spray, USE 1 TO 2 SPRAYS IN EACH NOSTRIL DAILY, Disp: 48 g, Rfl: 0 .  furosemide (LASIX) 20 MG tablet, Take 1 tablet (20 mg total) by mouth daily as needed., Disp: 90 tablet, Rfl: 3 .  glucose blood (CONTOUR NEXT TEST) test strip, Test blood sugars four times daily, Disp: 200 each,  Rfl: 5 .  liraglutide (VICTOZA) 18 MG/3ML SOPN, INJECT 0.3ML SQ DAILY, Disp: 9 mL, Rfl: 3 .  losartan (COZAAR) 50 MG tablet, TAKE ONE (1) TABLET EACH DAY, Disp: 90 tablet, Rfl: 0 .  Meclizine HCl 25 MG CHEW, Chew 1 tablet (25 mg total) by mouth at bedtime., Disp: 90 tablet, Rfl: 1 .  mometasone-formoterol (DULERA) 100-5 MCG/ACT AERO, USE 2 PUFFS TWICE DAILY, Disp: 13 g, Rfl: 0 .  montelukast (SINGULAIR) 10 MG tablet, TAKE ONE (1) TABLET EACH DAY, Disp: 90 tablet, Rfl: 1 .  naproxen (NAPROSYN) 500 MG tablet, Take 1 tablet (500 mg total) by mouth 2 (two) times daily with a meal., Disp: 60 tablet, Rfl: 1 .  pravastatin (PRAVACHOL) 80 MG tablet, TAKE ONE (1) TABLET EACH DAY, Disp: 90 tablet, Rfl: 3 .  tadalafil (ADCIRCA/CIALIS) 20 MG tablet, Take 0.5-1 tablets (10-20 mg total) by mouth every other day as needed for erectile dysfunction., Disp: 5 tablet, Rfl: 11 .  tamsulosin (FLOMAX) 0.4 MG CAPS capsule, TAKE ONE (1) CAPSULE EACH DAY, Disp: 90 capsule, Rfl: 0 .  Testosterone 20.25 MG/1.25GM (1.62%) GEL, Apply 20.25 mg topically every morning., Disp: 1.25 g, Rfl: 2 .  VYVANSE 30 MG capsule, Take 1 tablet by mouth 2 (two) times daily., Disp: , Rfl: 0  Allergies  Allergen Reactions  . Eggs Or Egg-Derived  Products Swelling    Throat  . Septra [Sulfamethoxazole-Trimethoprim] Swelling    throat  . Sulfonamide Derivatives     REACTION: hives    Objective:   BP 136/82   Pulse 81   Temp 98.7 F (37.1 C) (Oral)   Ht _0  (1.727 m)   Wt 194 lb 9.6 oz (88.3 kg)   BMI 29.59 kg/m    Physical Exam Vitals signs reviewed.  Constitutional:      General: He is not in acute distress.    Appearance: Normal appearance. He is not ill-appearing, toxic-appearing or diaphoretic.  HENT:     Head: Normocephalic and atraumatic.  Eyes:     General: No scleral icterus.       Right eye: No discharge.        Left eye: No discharge.  Neck:     Musculoskeletal: Normal range of motion.  Cardiovascular:      Rate and Rhythm: Normal rate.  Pulmonary:     Effort: Pulmonary effort is normal. No respiratory distress.  Musculoskeletal: Normal range of motion.  Skin:    General: Skin is warm and dry.     Comments: Myxoid cyst to right middle finger.   Neurological:     Mental Status: He is alert and oriented to person, place, and time.  Psychiatric:        Mood and Affect: Mood normal.        Behavior: Behavior normal.        Thought Content: Thought content normal.        Judgment: Judgment normal.     Assessment & Plan:   1. Myxoid Cyst - Ambulatory referral to Hand Surgery   Follow up plan: Return if symptoms worsen or fail to improve.  Hendricks Limes, MSN, APRN, FNP-C [place]

## 2018-07-21 ENCOUNTER — Other Ambulatory Visit: Payer: Self-pay | Admitting: Orthopedic Surgery

## 2018-07-21 DIAGNOSIS — R2231 Localized swelling, mass and lump, right upper limb: Secondary | ICD-10-CM | POA: Diagnosis not present

## 2018-07-21 DIAGNOSIS — M19041 Primary osteoarthritis, right hand: Secondary | ICD-10-CM | POA: Diagnosis not present

## 2018-07-23 ENCOUNTER — Other Ambulatory Visit: Payer: Self-pay

## 2018-07-23 ENCOUNTER — Encounter (HOSPITAL_BASED_OUTPATIENT_CLINIC_OR_DEPARTMENT_OTHER): Payer: Self-pay | Admitting: *Deleted

## 2018-07-26 ENCOUNTER — Other Ambulatory Visit: Payer: Self-pay | Admitting: Family Medicine

## 2018-07-27 ENCOUNTER — Other Ambulatory Visit: Payer: Self-pay | Admitting: Family Medicine

## 2018-07-27 DIAGNOSIS — J4520 Mild intermittent asthma, uncomplicated: Secondary | ICD-10-CM

## 2018-07-29 ENCOUNTER — Other Ambulatory Visit (HOSPITAL_BASED_OUTPATIENT_CLINIC_OR_DEPARTMENT_OTHER): Payer: BC Managed Care – PPO

## 2018-07-29 ENCOUNTER — Other Ambulatory Visit (HOSPITAL_COMMUNITY)
Admission: RE | Admit: 2018-07-29 | Discharge: 2018-07-29 | Disposition: A | Discharge status: Home / Self Care | Payer: BC Managed Care – PPO | Source: Ambulatory Visit | Attending: Orthopedic Surgery | Admitting: Orthopedic Surgery

## 2018-07-29 ENCOUNTER — Other Ambulatory Visit: Payer: Self-pay

## 2018-07-29 ENCOUNTER — Encounter (HOSPITAL_BASED_OUTPATIENT_CLINIC_OR_DEPARTMENT_OTHER)
Admission: RE | Admit: 2018-07-29 | Discharge: 2018-07-29 | Disposition: A | Discharge status: Home / Self Care | Payer: BC Managed Care – PPO | Source: Ambulatory Visit | Attending: Orthopedic Surgery | Admitting: Orthopedic Surgery

## 2018-07-29 DIAGNOSIS — Z01818 Encounter for other preprocedural examination: Secondary | ICD-10-CM | POA: Insufficient documentation

## 2018-07-29 DIAGNOSIS — Z7951 Long term (current) use of inhaled steroids: Secondary | ICD-10-CM | POA: Diagnosis not present

## 2018-07-29 DIAGNOSIS — Z87891 Personal history of nicotine dependence: Secondary | ICD-10-CM | POA: Diagnosis not present

## 2018-07-29 DIAGNOSIS — R9431 Abnormal electrocardiogram [ECG] [EKG]: Secondary | ICD-10-CM | POA: Insufficient documentation

## 2018-07-29 DIAGNOSIS — I10 Essential (primary) hypertension: Secondary | ICD-10-CM | POA: Insufficient documentation

## 2018-07-29 DIAGNOSIS — Z1159 Encounter for screening for other viral diseases: Secondary | ICD-10-CM | POA: Insufficient documentation

## 2018-07-29 DIAGNOSIS — Z791 Long term (current) use of non-steroidal anti-inflammatories (NSAID): Secondary | ICD-10-CM | POA: Diagnosis not present

## 2018-07-29 DIAGNOSIS — G7100 Muscular dystrophy, unspecified: Secondary | ICD-10-CM | POA: Diagnosis not present

## 2018-07-29 DIAGNOSIS — M67441 Ganglion, right hand: Secondary | ICD-10-CM | POA: Diagnosis not present

## 2018-07-29 DIAGNOSIS — Z87442 Personal history of urinary calculi: Secondary | ICD-10-CM | POA: Diagnosis not present

## 2018-07-29 DIAGNOSIS — Z8249 Family history of ischemic heart disease and other diseases of the circulatory system: Secondary | ICD-10-CM | POA: Diagnosis not present

## 2018-07-29 DIAGNOSIS — E119 Type 2 diabetes mellitus without complications: Secondary | ICD-10-CM | POA: Insufficient documentation

## 2018-07-29 DIAGNOSIS — Z91012 Allergy to eggs: Secondary | ICD-10-CM | POA: Diagnosis not present

## 2018-07-29 DIAGNOSIS — H9190 Unspecified hearing loss, unspecified ear: Secondary | ICD-10-CM | POA: Diagnosis not present

## 2018-07-29 DIAGNOSIS — N400 Enlarged prostate without lower urinary tract symptoms: Secondary | ICD-10-CM | POA: Diagnosis not present

## 2018-07-29 DIAGNOSIS — Z881 Allergy status to other antibiotic agents status: Secondary | ICD-10-CM | POA: Diagnosis not present

## 2018-07-29 DIAGNOSIS — Z79899 Other long term (current) drug therapy: Secondary | ICD-10-CM | POA: Diagnosis not present

## 2018-07-29 DIAGNOSIS — L728 Other follicular cysts of the skin and subcutaneous tissue: Secondary | ICD-10-CM | POA: Diagnosis present

## 2018-07-29 DIAGNOSIS — Z882 Allergy status to sulfonamides status: Secondary | ICD-10-CM | POA: Diagnosis not present

## 2018-07-29 DIAGNOSIS — K219 Gastro-esophageal reflux disease without esophagitis: Secondary | ICD-10-CM | POA: Diagnosis not present

## 2018-07-29 DIAGNOSIS — J45909 Unspecified asthma, uncomplicated: Secondary | ICD-10-CM | POA: Diagnosis not present

## 2018-07-29 DIAGNOSIS — E785 Hyperlipidemia, unspecified: Secondary | ICD-10-CM | POA: Diagnosis not present

## 2018-07-29 DIAGNOSIS — Z803 Family history of malignant neoplasm of breast: Secondary | ICD-10-CM | POA: Diagnosis not present

## 2018-07-29 DIAGNOSIS — M71341 Other bursal cyst, right hand: Secondary | ICD-10-CM | POA: Diagnosis not present

## 2018-07-29 LAB — BASIC METABOLIC PANEL
Anion gap: 10 (ref 5–15)
BUN: 17 mg/dL (ref 6–20)
CO2: 22 mmol/L (ref 22–32)
Calcium: 9.1 mg/dL (ref 8.9–10.3)
Chloride: 104 mmol/L (ref 98–111)
Creatinine, Ser: 1.07 mg/dL (ref 0.61–1.24)
GFR calc Af Amer: >60 mL/min (ref >60)
GFR calc non Af Amer: >60 mL/min (ref >60)
Glucose, Bld: 105 mg/dL — ABNORMAL HIGH (ref 70–99)
Potassium: 4.1 mmol/L (ref 3.5–5.1)
Sodium: 136 mmol/L (ref 135–145)

## 2018-07-29 LAB — SARS CORONAVIRUS 2 (TAT 6-24 HRS): SARS Coronavirus 2: NEGATIVE

## 2018-07-29 NOTE — Progress Notes (Signed)
Pt given G2 Gatorade for pre surgical drink. Instructions given and NPO otherwise DOS. Pt. Verbalized understanding.

## 2018-08-02 ENCOUNTER — Other Ambulatory Visit: Payer: Self-pay

## 2018-08-02 ENCOUNTER — Ambulatory Visit (HOSPITAL_BASED_OUTPATIENT_CLINIC_OR_DEPARTMENT_OTHER): Payer: BC Managed Care – PPO | Admitting: Anesthesiology

## 2018-08-02 ENCOUNTER — Ambulatory Visit (HOSPITAL_BASED_OUTPATIENT_CLINIC_OR_DEPARTMENT_OTHER)
Admission: RE | Admit: 2018-08-02 | Discharge: 2018-08-02 | Disposition: A | Discharge status: Home / Self Care | Payer: BC Managed Care – PPO | Attending: Orthopedic Surgery | Admitting: Orthopedic Surgery

## 2018-08-02 ENCOUNTER — Encounter (HOSPITAL_BASED_OUTPATIENT_CLINIC_OR_DEPARTMENT_OTHER)
Admission: RE | Disposition: A | Discharge status: Home / Self Care | Payer: Self-pay | Source: Home / Self Care | Attending: Orthopedic Surgery

## 2018-08-02 ENCOUNTER — Encounter (HOSPITAL_BASED_OUTPATIENT_CLINIC_OR_DEPARTMENT_OTHER): Payer: Self-pay | Admitting: *Deleted

## 2018-08-02 DIAGNOSIS — H9190 Unspecified hearing loss, unspecified ear: Secondary | ICD-10-CM | POA: Diagnosis not present

## 2018-08-02 DIAGNOSIS — Z881 Allergy status to other antibiotic agents status: Secondary | ICD-10-CM | POA: Diagnosis not present

## 2018-08-02 DIAGNOSIS — Z791 Long term (current) use of non-steroidal anti-inflammatories (NSAID): Secondary | ICD-10-CM | POA: Diagnosis not present

## 2018-08-02 DIAGNOSIS — Z882 Allergy status to sulfonamides status: Secondary | ICD-10-CM | POA: Insufficient documentation

## 2018-08-02 DIAGNOSIS — N400 Enlarged prostate without lower urinary tract symptoms: Secondary | ICD-10-CM | POA: Diagnosis not present

## 2018-08-02 DIAGNOSIS — K219 Gastro-esophageal reflux disease without esophagitis: Secondary | ICD-10-CM | POA: Diagnosis not present

## 2018-08-02 DIAGNOSIS — M67441 Ganglion, right hand: Secondary | ICD-10-CM | POA: Diagnosis not present

## 2018-08-02 DIAGNOSIS — E785 Hyperlipidemia, unspecified: Secondary | ICD-10-CM | POA: Diagnosis not present

## 2018-08-02 DIAGNOSIS — Z803 Family history of malignant neoplasm of breast: Secondary | ICD-10-CM | POA: Insufficient documentation

## 2018-08-02 DIAGNOSIS — Z87891 Personal history of nicotine dependence: Secondary | ICD-10-CM | POA: Insufficient documentation

## 2018-08-02 DIAGNOSIS — J45909 Unspecified asthma, uncomplicated: Secondary | ICD-10-CM | POA: Diagnosis not present

## 2018-08-02 DIAGNOSIS — Z87442 Personal history of urinary calculi: Secondary | ICD-10-CM | POA: Insufficient documentation

## 2018-08-02 DIAGNOSIS — E119 Type 2 diabetes mellitus without complications: Secondary | ICD-10-CM | POA: Insufficient documentation

## 2018-08-02 DIAGNOSIS — G7100 Muscular dystrophy, unspecified: Secondary | ICD-10-CM | POA: Diagnosis not present

## 2018-08-02 DIAGNOSIS — Z79899 Other long term (current) drug therapy: Secondary | ICD-10-CM | POA: Insufficient documentation

## 2018-08-02 DIAGNOSIS — I10 Essential (primary) hypertension: Secondary | ICD-10-CM | POA: Insufficient documentation

## 2018-08-02 DIAGNOSIS — M71341 Other bursal cyst, right hand: Secondary | ICD-10-CM | POA: Diagnosis not present

## 2018-08-02 DIAGNOSIS — Z7951 Long term (current) use of inhaled steroids: Secondary | ICD-10-CM | POA: Insufficient documentation

## 2018-08-02 DIAGNOSIS — Z91012 Allergy to eggs: Secondary | ICD-10-CM | POA: Insufficient documentation

## 2018-08-02 DIAGNOSIS — Z8249 Family history of ischemic heart disease and other diseases of the circulatory system: Secondary | ICD-10-CM | POA: Insufficient documentation

## 2018-08-02 HISTORY — DX: Other complications of anesthesia, initial encounter: T88.59XA

## 2018-08-02 HISTORY — DX: Nausea with vomiting, unspecified: R11.2

## 2018-08-02 HISTORY — PX: MASS EXCISION: SHX2000

## 2018-08-02 HISTORY — DX: Other specified postprocedural states: Z98.890

## 2018-08-02 HISTORY — DX: Unspecified hearing loss, unspecified ear: H91.90

## 2018-08-02 HISTORY — DX: Essential (primary) hypertension: I10

## 2018-08-02 LAB — GLUCOSE, CAPILLARY
Glucose-Capillary: 95 mg/dL (ref 70–99)
Glucose-Capillary: 80 mg/dL (ref 70–99)

## 2018-08-02 SURGERY — EXCISION MASS
Anesthesia: Monitor Anesthesia Care | Site: Finger | Laterality: Right

## 2018-08-02 MED ORDER — LIDOCAINE 2% (20 MG/ML) 5 ML SYRINGE
INTRAMUSCULAR | Status: AC
  Filled 2018-08-02: qty 5

## 2018-08-02 MED ORDER — ONDANSETRON HCL 4 MG/2ML IJ SOLN: 4.0000 mg | Freq: Once | INTRAMUSCULAR | Status: DC | PRN

## 2018-08-02 MED ORDER — FENTANYL CITRATE (PF) 100 MCG/2ML IJ SOLN: 50.0000 ug | INTRAMUSCULAR | Status: DC | PRN

## 2018-08-02 MED ORDER — CHLORHEXIDINE GLUCONATE 4 % EX LIQD: 60.0000 mL | Freq: Once | CUTANEOUS | Status: DC

## 2018-08-02 MED ORDER — BUPIVACAINE HCL (PF) 0.25 % IJ SOLN
INTRAMUSCULAR | Status: DC | PRN
  Administered 2018-08-02: 10 mL

## 2018-08-02 MED ORDER — SCOPOLAMINE 1 MG/3DAYS TD PT72: 1.0000 | MEDICATED_PATCH | Freq: Once | TRANSDERMAL | Status: DC

## 2018-08-02 MED ORDER — ACETAMINOPHEN 325 MG PO TABS: 325.0000 mg | ORAL_TABLET | ORAL | Status: DC | PRN

## 2018-08-02 MED ORDER — DEXAMETHASONE SODIUM PHOSPHATE 10 MG/ML IJ SOLN
INTRAMUSCULAR | Status: AC
  Filled 2018-08-02: qty 1

## 2018-08-02 MED ORDER — LACTATED RINGERS IV SOLN
INTRAVENOUS | Status: DC
  Administered 2018-08-02 (×2): via INTRAVENOUS

## 2018-08-02 MED ORDER — CEFAZOLIN SODIUM 1 G IJ SOLR
INTRAMUSCULAR | Status: AC
  Filled 2018-08-02: qty 20

## 2018-08-02 MED ORDER — 0.9 % SODIUM CHLORIDE (POUR BTL) OPTIME
TOPICAL | Status: DC | PRN
  Administered 2018-08-02: 120 mL

## 2018-08-02 MED ORDER — FENTANYL CITRATE (PF) 100 MCG/2ML IJ SOLN
INTRAMUSCULAR | Status: AC
  Filled 2018-08-02: qty 2

## 2018-08-02 MED ORDER — ONDANSETRON HCL 4 MG/2ML IJ SOLN
INTRAMUSCULAR | Status: AC
  Filled 2018-08-02: qty 2

## 2018-08-02 MED ORDER — MIDAZOLAM HCL 5 MG/5ML IJ SOLN
INTRAMUSCULAR | Status: DC | PRN
  Administered 2018-08-02: 2 mg via INTRAVENOUS

## 2018-08-02 MED ORDER — MIDAZOLAM HCL 2 MG/2ML IJ SOLN
INTRAMUSCULAR | Status: AC
  Filled 2018-08-02: qty 2

## 2018-08-02 MED ORDER — FENTANYL CITRATE (PF) 100 MCG/2ML IJ SOLN
INTRAMUSCULAR | Status: DC | PRN
  Administered 2018-08-02 (×2): 25 ug via INTRAVENOUS

## 2018-08-02 MED ORDER — ACETAMINOPHEN 160 MG/5ML PO SOLN: 325.0000 mg | ORAL | Status: DC | PRN

## 2018-08-02 MED ORDER — CEFAZOLIN SODIUM-DEXTROSE 2-4 GM/100ML-% IV SOLN
2.0000 g | INTRAVENOUS | Status: AC
  Administered 2018-08-02: 1 g via INTRAVENOUS

## 2018-08-02 MED ORDER — LIDOCAINE HCL (PF) 0.5 % IJ SOLN
INTRAMUSCULAR | Status: DC | PRN
  Administered 2018-08-02: 30 mL via INTRAVENOUS

## 2018-08-02 MED ORDER — OXYCODONE HCL 5 MG/5ML PO SOLN: 5.0000 mg | Freq: Once | ORAL | Status: DC | PRN

## 2018-08-02 MED ORDER — HYDROCODONE-ACETAMINOPHEN 5-325 MG PO TABS: ORAL_TABLET | ORAL | 0 refills | Status: DC

## 2018-08-02 MED ORDER — SODIUM CHLORIDE (PF) 0.9 % IJ SOLN
INTRAMUSCULAR | Status: AC
  Filled 2018-08-02: qty 10

## 2018-08-02 MED ORDER — PROPOFOL 10 MG/ML IV BOLUS
INTRAVENOUS | Status: AC
  Filled 2018-08-02: qty 20

## 2018-08-02 MED ORDER — ONDANSETRON HCL 4 MG/2ML IJ SOLN
INTRAMUSCULAR | Status: DC | PRN
  Administered 2018-08-02: 4 mg via INTRAVENOUS

## 2018-08-02 MED ORDER — FENTANYL CITRATE (PF) 100 MCG/2ML IJ SOLN: 25.0000 ug | INTRAMUSCULAR | Status: DC | PRN

## 2018-08-02 MED ORDER — PROPOFOL 500 MG/50ML IV EMUL
INTRAVENOUS | Status: AC
  Filled 2018-08-02: qty 50

## 2018-08-02 MED ORDER — PROPOFOL 500 MG/50ML IV EMUL
INTRAVENOUS | Status: DC | PRN
  Administered 2018-08-02: 25 ug/kg/min via INTRAVENOUS

## 2018-08-02 MED ORDER — MIDAZOLAM HCL 2 MG/2ML IJ SOLN: 1.0000 mg | INTRAMUSCULAR | Status: DC | PRN

## 2018-08-02 MED ORDER — OXYCODONE HCL 5 MG PO TABS: 5.0000 mg | ORAL_TABLET | Freq: Once | ORAL | Status: DC | PRN

## 2018-08-02 MED ORDER — MEPERIDINE HCL 25 MG/ML IJ SOLN: 6.2500 mg | INTRAMUSCULAR | Status: DC | PRN

## 2018-08-02 SURGICAL SUPPLY — 56 items
BENZOIN TINCTURE PRP APPL 2/3 (GAUZE/BANDAGES/DRESSINGS) IMPLANT
BLADE MINI RND TIP GREEN BEAV (BLADE) IMPLANT
BLADE SURG 15 STRL LF DISP TIS (BLADE) ×2 IMPLANT
BLADE SURG 15 STRL SS (BLADE) ×2
BNDG COHESIVE 1X5 TAN STRL LF (GAUZE/BANDAGES/DRESSINGS) ×1 IMPLANT
BNDG COHESIVE 2X5 TAN STRL LF (GAUZE/BANDAGES/DRESSINGS) IMPLANT
BNDG CONFORM 2 STRL LF (GAUZE/BANDAGES/DRESSINGS) IMPLANT
BNDG ELASTIC 2X5.8 VLCR STR LF (GAUZE/BANDAGES/DRESSINGS) IMPLANT
BNDG ELASTIC 3X5.8 VLCR STR LF (GAUZE/BANDAGES/DRESSINGS) IMPLANT
BNDG ESMARK 4X9 LF (GAUZE/BANDAGES/DRESSINGS) ×1 IMPLANT
BNDG GAUZE 1X2.1 STRL (MISCELLANEOUS) IMPLANT
BNDG GAUZE ELAST 4 BULKY (GAUZE/BANDAGES/DRESSINGS) IMPLANT
BNDG PLASTER X FAST 3X3 WHT LF (CAST SUPPLIES) IMPLANT
CHLORAPREP W/TINT 26 (MISCELLANEOUS) ×2 IMPLANT
CORD BIPOLAR FORCEPS 12FT (ELECTRODE) ×2 IMPLANT
COVER BACK TABLE REUSABLE LG (DRAPES) ×2 IMPLANT
COVER MAYO STAND REUSABLE (DRAPES) ×2 IMPLANT
COVER WAND RF STERILE (DRAPES) IMPLANT
CUFF TOURN SGL QUICK 18X4 (TOURNIQUET CUFF) ×2 IMPLANT
DECANTER SPIKE VIAL GLASS SM (MISCELLANEOUS) ×1 IMPLANT
DRAPE EXTREMITY T 121X128X90 (DISPOSABLE) ×2 IMPLANT
DRAPE SURG 17X23 STRL (DRAPES) ×2 IMPLANT
GAUZE SPONGE 4X4 12PLY STRL (GAUZE/BANDAGES/DRESSINGS) ×2 IMPLANT
GAUZE XEROFORM 1X8 LF (GAUZE/BANDAGES/DRESSINGS) ×2 IMPLANT
GLOVE BIO SURGEON STRL SZ7.5 (GLOVE) ×2 IMPLANT
GLOVE BIOGEL PI IND STRL 7.0 (GLOVE) IMPLANT
GLOVE BIOGEL PI IND STRL 8 (GLOVE) ×1 IMPLANT
GLOVE BIOGEL PI INDICATOR 7.0 (GLOVE) ×2
GLOVE BIOGEL PI INDICATOR 8 (GLOVE) ×1
GLOVE ECLIPSE 6.5 STRL STRAW (GLOVE) ×1 IMPLANT
GOWN STRL REUS W/ TWL LRG LVL3 (GOWN DISPOSABLE) ×1 IMPLANT
GOWN STRL REUS W/TWL LRG LVL3 (GOWN DISPOSABLE) ×1
GOWN STRL REUS W/TWL XL LVL3 (GOWN DISPOSABLE) ×2 IMPLANT
NDL HYPO 25X1 1.5 SAFETY (NEEDLE) ×1 IMPLANT
NEEDLE HYPO 25X1 1.5 SAFETY (NEEDLE) ×2 IMPLANT
NS IRRIG 1000ML POUR BTL (IV SOLUTION) ×2 IMPLANT
PACK BASIN DAY SURGERY FS (CUSTOM PROCEDURE TRAY) ×2 IMPLANT
PAD CAST 3X4 CTTN HI CHSV (CAST SUPPLIES) IMPLANT
PAD CAST 4YDX4 CTTN HI CHSV (CAST SUPPLIES) IMPLANT
PADDING CAST ABS 4INX4YD NS (CAST SUPPLIES)
PADDING CAST ABS COTTON 4X4 ST (CAST SUPPLIES) ×1 IMPLANT
PADDING CAST COTTON 3X4 STRL (CAST SUPPLIES)
PADDING CAST COTTON 4X4 STRL (CAST SUPPLIES)
SPLINT FINGER 3.25 911903 (SOFTGOODS) ×1 IMPLANT
STOCKINETTE 4X48 STRL (DRAPES) ×2 IMPLANT
STRIP CLOSURE SKIN 1/2X4 (GAUZE/BANDAGES/DRESSINGS) IMPLANT
SUT ETHILON 3 0 PS 1 (SUTURE) IMPLANT
SUT ETHILON 4 0 PS 2 18 (SUTURE) ×2 IMPLANT
SUT ETHILON 5 0 P 3 18 (SUTURE)
SUT MERSILENE 4 0 P 3 (SUTURE) ×1 IMPLANT
SUT NYLON ETHILON 5-0 P-3 1X18 (SUTURE) IMPLANT
SUT VIC AB 4-0 P2 18 (SUTURE) IMPLANT
SYR BULB 3OZ (MISCELLANEOUS) ×2 IMPLANT
SYR CONTROL 10ML LL (SYRINGE) ×2 IMPLANT
TOWEL GREEN STERILE FF (TOWEL DISPOSABLE) ×3 IMPLANT
UNDERPAD 30X30 (UNDERPADS AND DIAPERS) ×2 IMPLANT

## 2018-08-02 NOTE — Anesthesia Preprocedure Evaluation (Addendum)
Anesthesia Evaluation  Patient identified by MRN, date of birth, ID band Patient awake    Reviewed: Allergy & Precautions, H&P , NPO status , Patient's Chart, lab work & pertinent test results, reviewed documented beta blocker date and time   History of Anesthesia Complications (+) PONV and history of anesthetic complications  Airway Mallampati: II  TM Distance: >3 FB Neck ROM: full    Dental no notable dental hx. (+) Poor Dentition   Pulmonary asthma , former smoker,    Pulmonary exam normal breath sounds clear to auscultation       Cardiovascular Exercise Tolerance: Good hypertension, Pt. on medications  Rhythm:regular Rate:Normal  ECHO 2/17 - Left ventricle: The cavity size was normal. Wall thickness was  normal. Systolic function was normal. The estimated ejection  fraction was in the range of 60% to 65%.  - Left atrium: The atrium was mildly dilated.  - Pulmonary arteries: PA peak pressure: 32 mm Hg (S).   Myo scan 2/17 - NL   Neuro/Psych Muscular dystrophy   Neuromuscular disease negative psych ROS   GI/Hepatic Neg liver ROS, GERD  Medicated,  Endo/Other  negative endocrine ROSdiabetes, Type 2Alpha galactosidase deficiency  Renal/GU negative Renal ROS  negative genitourinary   Musculoskeletal   Abdominal   Peds  Hematology negative hematology ROS (+)   Anesthesia Other Findings   Reproductive/Obstetrics negative OB ROS                           Anesthesia Physical Anesthesia Plan  ASA: III  Anesthesia Plan: Bier Block and MAC and Bier Block-LIDOCAINE ONLY   Post-op Pain Management:    Induction: Intravenous  PONV Risk Score and Plan: 2 and Ondansetron, Scopolamine patch - Pre-op, Dexamethasone and Treatment may vary due to age or medical condition  Airway Management Planned: Mask, Natural Airway and Nasal Cannula  Additional Equipment:   Intra-op Plan:    Post-operative Plan:   Informed Consent: I have reviewed the patients History and Physical, chart, labs and discussed the procedure including the risks, benefits and alternatives for the proposed anesthesia with the patient or authorized representative who has indicated his/her understanding and acceptance.     Dental Advisory Given  Plan Discussed with: CRNA, Anesthesiologist and Surgeon  Anesthesia Plan Comments:        Anesthesia Quick Evaluation

## 2018-08-02 NOTE — Discharge Instructions (Addendum)

## 2018-08-02 NOTE — Anesthesia Procedure Notes (Addendum)
Procedure Name: MAC Date/Time: 08/02/2018 9:43 AM Performed by: Marrianne Mood, CRNA Pre-anesthesia Checklist: Patient identified, Emergency Drugs available, Suction available, Patient being monitored and Timeout performed Patient Re-evaluated:Patient Re-evaluated prior to induction Oxygen Delivery Method: Simple face mask Preoxygenation: Pre-oxygenation with 100% oxygen

## 2018-08-02 NOTE — Anesthesia Procedure Notes (Addendum)
Anesthesia Regional Block: Bier block (IV Regional)   Pre-Anesthetic Checklist: ,, timeout performed, Correct Patient, Correct Site, Correct Laterality, Correct Procedure, Correct Position, site marked, Risks and benefits discussed,  Surgical consent,  Pre-op evaluation,  At surgeon's request and post-op pain management  Laterality: Right  Prep: chloraprep        Narrative:  Start time: 08/02/2018 9:47 AM End time: 08/02/2018 9:48 AM  Events: blood aspirated,,,,,,,,,,

## 2018-08-02 NOTE — Transfer of Care (Signed)
Immediate Anesthesia Transfer of Care Note  Patient: Johnny Mathews  Procedure(s) Performed: EXCISION RIGHT LONG FINGER MASS, DEBRIDEMENT PROXIMAL INTERPHALANGEAL JOINT WITH ROTATION FLAP (Right Finger)  Patient Location: PACU  Anesthesia Type:Bier block  Level of Consciousness: awake and patient cooperative  Airway & Oxygen Therapy: Patient Spontanous Breathing and Patient connected to face mask oxygen  Post-op Assessment: Report given to RN and Post -op Vital signs reviewed and stable  Post vital signs: Reviewed and stable  Last Vitals:  Vitals Value Taken Time  BP    Temp    Pulse 54 08/02/18 1044  Resp    SpO2 85 % 08/02/18 1044  Vitals shown include unvalidated device data.  Last Pain:  Vitals:   08/02/18 0818  TempSrc: Oral  PainSc: 2       Patients Stated Pain Goal: 3 (26/20/35 5974)  Complications: No apparent anesthesia complications

## 2018-08-02 NOTE — Anesthesia Postprocedure Evaluation (Signed)
Anesthesia Post Note  Patient: Johnny Mathews  Procedure(s) Performed: EXCISION RIGHT LONG FINGER MASS, DEBRIDEMENT PROXIMAL INTERPHALANGEAL JOINT WITH ROTATION FLAP (Right Finger)     Patient location during evaluation: PACU Anesthesia Type: MAC Level of consciousness: awake and alert Pain management: pain level controlled Vital Signs Assessment: post-procedure vital signs reviewed and stable Respiratory status: spontaneous breathing, nonlabored ventilation, respiratory function stable and patient connected to nasal cannula oxygen Cardiovascular status: stable and blood pressure returned to baseline Postop Assessment: no apparent nausea or vomiting Anesthetic complications: no    Last Vitals:  Vitals:   08/02/18 1100 08/02/18 1127  BP: (!) 159/94 135/80  Pulse: 67   Resp: 15 20  Temp:  36.8 C  SpO2: 98% 97%    Last Pain:  Vitals:   08/02/18 1127  TempSrc:   PainSc: 0-No pain                 Elena Cothern

## 2018-08-02 NOTE — H&P (Signed)
Johnny Mathews is an 58 y.o. male.   Chief Complaint: right long finger mass HPI: 58 yo male with mass right long finger x one month.  It is bothersome to him and he wishes to have it removed.    Allergies:  Allergies  Allergen Reactions  . Eggs Or Egg-Derived Products Swelling    Throat  . Septra [Sulfamethoxazole-Trimethoprim] Swelling    throat  . Sulfonamide Derivatives     REACTION: hives    Past Medical History:  Diagnosis Date  . Alpha galactosidase deficiency   . Asthma   . BPH (benign prostatic hypertrophy)   . Complication of anesthesia   . Diabetes mellitus without complication ([place])   . GERD (gastroesophageal reflux disease)   . HOH (hard of hearing)   . Hyperlipidemia   . Hypertension   . Muscular dystrophy ([place])   . PONV (postoperative nausea and vomiting)     Past Surgical History:  Procedure Laterality Date  . KIDNEY STONE SURGERY    . KNEE SURGERY Right   . SHOULDER OPEN ROTATOR CUFF REPAIR      Family History: Family History  Problem Relation Age of Onset  . Breast cancer Mother   . Heart disease Father   . Breast cancer Sister     Social History:   reports that he quit smoking about 24 years ago. His smoking use included cigarettes. He has a 10.00 pack-year smoking history. He has never used smokeless tobacco. He reports current alcohol use. He reports that he does not use drugs.  Medications: Medications Prior to Admission  Medication Sig Dispense Refill  . albuterol (VENTOLIN HFA) 108 (90 Base) MCG/ACT inhaler Inhale 2 puffs into the lungs every 4 (four) hours as needed. (Needs to be seen before next refill) 18 g 0  . amitriptyline (ELAVIL) 25 MG tablet Take 3 tablets (75 mg total) by mouth at bedtime. 270 tablet 3  . azelastine (ASTELIN) 0.1 % nasal spray USE 2 SPRAYS IN EACH NOSTRIL AT BEDTIME 30 mL 2  . esomeprazole (NEXIUM) 40 MG capsule TAKE ONE (1) CAPSULE EACH DAY 90 capsule 0  . finasteride (PROSCAR) 5 MG tablet Take 1 tablet (5  mg total) by mouth daily. (Needs to be seen before next refill) 30 tablet 0  . fluticasone (FLONASE) 50 MCG/ACT nasal spray USE 1 TO 2 SPRAYS IN EACH NOSTRIL DAILY 48 g 0  . furosemide (LASIX) 20 MG tablet Take 1 tablet (20 mg total) by mouth daily as needed. 90 tablet 3  . liraglutide (VICTOZA) 18 MG/3ML SOPN INJECT 0.3ML SQ DAILY 9 mL 3  . losartan (COZAAR) 50 MG tablet TAKE ONE (1) TABLET EACH DAY 90 tablet 0  . Meclizine HCl 25 MG CHEW Chew 1 tablet (25 mg total) by mouth at bedtime. 90 tablet 1  . meloxicam (MOBIC) 15 MG tablet Take 15 mg by mouth daily.    . mometasone-formoterol (DULERA) 100-5 MCG/ACT AERO USE 2 PUFFS TWICE DAILY 13 g 0  . montelukast (SINGULAIR) 10 MG tablet TAKE ONE (1) TABLET EACH DAY 90 tablet 1  . pravastatin (PRAVACHOL) 80 MG tablet TAKE ONE (1) TABLET EACH DAY 90 tablet 3  . tadalafil (ADCIRCA/CIALIS) 20 MG tablet Take 0.5-1 tablets (10-20 mg total) by mouth every other day as needed for erectile dysfunction. 5 tablet 11  . tamsulosin (FLOMAX) 0.4 MG CAPS capsule TAKE ONE (1) CAPSULE EACH DAY 90 capsule 0  . Testosterone 20.25 MG/1.25GM (1.62%) GEL Apply 20.25 mg topically every morning.  1.25 g 2  . VYVANSE 30 MG capsule Take 1 tablet by mouth 2 (two) times daily.  0  . Blood Glucose Monitoring Suppl (CONTOUR BLOOD GLUCOSE SYSTEM) w/Device KIT Test blood sugars four times daily 200 each 5  . EPINEPHrine (EPI-PEN) 0.3 mg/0.3 mL DEVI Inject 0.3 mg into the skin as needed.     Marland Kitchen glucose blood (CONTOUR NEXT TEST) test strip Test blood sugars four times daily 200 each 5    Results for orders placed or performed during the hospital encounter of 08/02/18 (from the past 48 hour(s))  Glucose, capillary     Status: None   Collection Time: 08/02/18  8:13 AM  Result Value Ref Range   Glucose-Capillary 95 70 - 99 mg/dL    No results found.   A comprehensive review of systems was negative.  Blood pressure (!) 151/78, pulse 69, temperature 98.2 F (36.8 C), temperature  source Oral, resp. rate 20, height 5' 8.5" (1.74 m), weight 86.5 kg, SpO2 100 %.  General appearance: alert, cooperative and appears stated age Head: Normocephalic, without obvious abnormality, atraumatic Neck: supple, symmetrical, trachea midline Cardio: regular rate and rhythm Resp: clear to auscultation bilaterally Extremities: Intact sensation and capillary refill all digits.  +epl/fpl/io.  No wounds.  Pulses: 2+ and symmetric Skin: Skin color, texture, turgor normal. No rashes or lesions Neurologic: Grossly normal Incision/Wound: none  Assessment/Plan Right long finger mass.  Plan excision mass with possible debridement of PIP joint, possible rotation flap.  Non operative and operative treatment options have been discussed with the patient and patient wishes to proceed with operative treatment. Risks, benefits, and alternatives of surgery have been discussed and the patient agrees with the plan of care.   Leanora Cover 08/02/2018, 9:38 AM

## 2018-08-02 NOTE — Op Note (Signed)
NAME: Johnny Mathews MEDICAL RECORD NO: [id number] DATE OF BIRTH: Mar 06, 1960 FACILITY: [facility] LOCATION: [facility] PHYSICIAN: Tennis Must, MD   OPERATIVE REPORT   DATE OF PROCEDURE: 08/02/18    PREOPERATIVE DIAGNOSIS:   Right long finger ganglion cyst   POSTOPERATIVE DIAGNOSIS:   Right long finger ganglion cyst   PROCEDURE:   1.  Right long finger excision of ganglion cyst 2.  Right long finger debridement PIP joint   SURGEON:  Leanora Cover, M.D.   ASSISTANT: none   ANESTHESIA:  Bier block with sedation   INTRAVENOUS FLUIDS:  Per anesthesia flow sheet.   ESTIMATED BLOOD LOSS:  Minimal.   COMPLICATIONS:  None.   SPECIMENS:   Right long finger ganglion to pathology   TOURNIQUET TIME:    Total Tourniquet Time Documented: Forearm (Right) - 49 minutes Total: Forearm (Right) - 49 minutes    DISPOSITION:  Stable to PACU.   INDICATIONS: 58 year old male with mass on dorsum of right long finger for proximally 1 month.  This is bothersome to him.  He wishes to have it removed and the PIP joint debrided as necessary to reduce the chance of recurrence. Risks, benefits and alternatives of surgery were discussed including the risks of blood loss, infection, damage to nerves, vessels, tendons, ligaments, bone for surgery, need for additional surgery, complications with wound healing, continued pain, stiffness, recurrence of mass.  He voiced understanding of these risks and elected to proceed.  OPERATIVE COURSE:  After being identified preoperatively by myself,  the patient and I agreed on the procedure and site of the procedure.  The surgical site was marked.  Surgical consent had been signed. He was given IV antibiotics as preoperative antibiotic prophylaxis. He was transferred to the operating room and placed on the operating table in supine position with the Right upper extremity on an arm board.  Bier block anesthesia was induced by the anesthesiologist.  Right  upper extremity was prepped and draped in normal sterile orthopedic fashion.  A surgical pause was performed between the surgeons, anesthesia, and operating room staff and all were in agreement as to the patient, procedure, and site of procedure.  Tourniquet at the proximal aspect of the forearm had been inflated for the Bier block.    Incision was made at the radial side of the mass and carried in subcutaneous tissues by spreading technique.  Bipolar electrocautery was used to obtain hemostasis.  The mass was carefully freed from soft tissue attachments.  Was filled with clear gelatinous fluid.  The skin was thinned dorsally but was not violated.  There was a stalk found coming from the ulnar side of the PIP joint.  The incision was extended in an S shape to allow better visualization on the ulnar side of the joint.  The stalk was coming through the tendon at the ulnar side of the PIP joint.  The stalk was transected and sent to pathology for examination.  A rondure was used to debride the PIP joint through the rent in the extensor hood.  Synovium was removed.  There was some intra-articular ganglion which was also removed.  The wound was then copiously irrigated with sterile saline.  The rent in the extensor mechanism was repaired with a 4-0 Mersilene suture in a running fashion.  The skin was closed with 4-0 nylon in a horizontal mattress fashion.  The wound was dressed with sterile Xeroform 4 x 4's and wrapped with a Coban dressing lightly.  An  AlumaFoam splint was placed and wrapped lightly with Coban dressing.  Digital block was performed with quarter percent plain Marcaine to aid in postoperative analgesia.  The tourniquet was deflated at 49 minutes.  Fingertips were pink with brisk capillary refill after deflation of tourniquet.  The operative  drapes were broken down.  The patient was awoken from anesthesia safely.  He was transferred back to the stretcher and taken to PACU in stable condition.  I will see  him back in the office in 1 week for postoperative followup.  I will give him a prescription for Norco 5/325 1-2 tabs PO q6 hours prn pain, dispense # 20.   Leanora Cover, MD Electronically signed, 08/02/18

## 2018-08-03 ENCOUNTER — Encounter (HOSPITAL_BASED_OUTPATIENT_CLINIC_OR_DEPARTMENT_OTHER): Payer: Self-pay | Admitting: Orthopedic Surgery

## 2018-08-04 DIAGNOSIS — Z79899 Other long term (current) drug therapy: Secondary | ICD-10-CM | POA: Diagnosis not present

## 2018-08-04 DIAGNOSIS — F902 Attention-deficit hyperactivity disorder, combined type: Secondary | ICD-10-CM | POA: Diagnosis not present

## 2018-08-05 ENCOUNTER — Other Ambulatory Visit: Payer: Self-pay | Admitting: "Endocrinology

## 2018-08-05 DIAGNOSIS — E291 Testicular hypofunction: Secondary | ICD-10-CM | POA: Diagnosis not present

## 2018-08-09 LAB — CBC WITH DIFFERENTIAL/PLATELET
Basophils Absolute: 0 10*3/uL (ref 0.0–0.2)
Basos: 1 %
EOS (ABSOLUTE): 0.2 10*3/uL (ref 0.0–0.4)
Eos: 3 %
Hematocrit: 38.9 % (ref 37.5–51.0)
Hemoglobin: 13.4 g/dL (ref 13.0–17.7)
Immature Grans (Abs): 0 10*3/uL (ref 0.0–0.1)
Immature Granulocytes: 0 %
Lymphocytes Absolute: 1.5 10*3/uL (ref 0.7–3.1)
Lymphs: 28 %
MCH: 31.4 pg (ref 26.6–33.0)
MCHC: 34.4 g/dL (ref 31.5–35.7)
MCV: 91 fL (ref 79–97)
Monocytes Absolute: 0.5 10*3/uL (ref 0.1–0.9)
Monocytes: 9 %
Neutrophils Absolute: 3.2 10*3/uL (ref 1.4–7.0)
Neutrophils: 59 %
Platelets: 269 10*3/uL (ref 150–450)
RBC: 4.27 x10E6/uL (ref 4.14–5.80)
RDW: 12.4 % (ref 11.6–15.4)
WBC: 5.3 10*3/uL (ref 3.4–10.8)

## 2018-08-09 LAB — COMPREHENSIVE METABOLIC PANEL
ALT: 24 IU/L (ref 0–44)
AST: 27 IU/L (ref 0–40)
Albumin/Globulin Ratio: 1.9 (ref 1.2–2.2)
Albumin: 4.6 g/dL (ref 3.8–4.9)
Alkaline Phosphatase: 106 IU/L (ref 39–117)
BUN/Creatinine Ratio: 13 (ref 10–24)
BUN: 16 mg/dL (ref 8–27)
Bilirubin Total: 0.4 mg/dL (ref 0.0–1.2)
CO2: 23 mmol/L (ref 20–29)
Calcium: 9.5 mg/dL (ref 8.6–10.2)
Chloride: 98 mmol/L (ref 96–106)
Creatinine, Ser: 1.2 mg/dL (ref 0.76–1.27)
GFR calc Af Amer: 76 mL/min/{1.73_m2} (ref >59)
GFR calc non Af Amer: 65 mL/min/{1.73_m2} (ref >59)
Globulin, Total: 2.4 g/dL (ref 1.5–4.5)
Glucose: 110 mg/dL — ABNORMAL HIGH (ref 65–99)
Potassium: 4.3 mmol/L (ref 3.5–5.2)
Sodium: 140 mmol/L (ref 134–144)
Total Protein: 7 g/dL (ref 6.0–8.5)

## 2018-08-09 LAB — PSA: Prostate Specific Ag, Serum: 0.1 ng/mL (ref 0.0–4.0)

## 2018-08-09 LAB — TESTOSTERONE, FREE AND TOTAL (INCLUDES SHBG)-(MALES)
% Free Testosterone: 0.7 %
Free Testosterone, S: 19 pg/mL — ABNORMAL LOW
Sex Hormone Binding Globulin: 85.6 nmol/L — ABNORMAL HIGH
Testosterone, Serum (Total): 271 ng/dL

## 2018-08-12 ENCOUNTER — Encounter: Payer: Self-pay | Admitting: "Endocrinology

## 2018-08-12 ENCOUNTER — Ambulatory Visit: Payer: BC Managed Care – PPO | Admitting: "Endocrinology

## 2018-08-12 ENCOUNTER — Other Ambulatory Visit: Payer: Self-pay

## 2018-08-12 VITALS — BP 133/76 | HR 80 | Ht 68.0 in | Wt 192.0 lb

## 2018-08-12 DIAGNOSIS — E291 Testicular hypofunction: Secondary | ICD-10-CM | POA: Diagnosis not present

## 2018-08-12 MED ORDER — TESTOSTERONE 20.25 MG/1.25GM (1.62%) TD GEL: TRANSDERMAL | 2 refills | Status: DC

## 2018-08-12 NOTE — Progress Notes (Signed)
08/12/2018                                Endocrinology Telehealth Visit Follow up Note -During COVID -19 Pandemic  I connected with Johnny Mathews on 08/12/2018   by telephone and verified that I am speaking with the correct person using two identifiers. Johnny Mathews, Feb 27, 1960. he has verbally consented to this visit. All issues noted in this document were discussed and addressed. The format was not optimal for physical exam.    Johnny Mathews, 58 y.o., male   Chief Complaint  Patient presents with  . Follow-up    Hypogonadism     Past Medical History:  Diagnosis Date  . Alpha galactosidase deficiency   . Asthma   . BPH (benign prostatic hypertrophy)   . Complication of anesthesia   . Diabetes mellitus without complication ([place])   . GERD (gastroesophageal reflux disease)   . HOH (hard of hearing)   . Hyperlipidemia   . Hypertension   . Muscular dystrophy ([place])   . PONV (postoperative nausea and vomiting)    Past Surgical History:  Procedure Laterality Date  . KIDNEY STONE SURGERY    . KNEE SURGERY Right   . MASS EXCISION Right 08/02/2018   Procedure: EXCISION RIGHT LONG FINGER MASS, DEBRIDEMENT PROXIMAL INTERPHALANGEAL JOINT WITH ROTATION FLAP;  Surgeon: Leanora Cover, MD;  Location: [facility];  Service: Orthopedics;  Laterality: Right;  . SHOULDER OPEN ROTATOR CUFF REPAIR     Social History   Socioeconomic History  . Marital status: Married    Spouse name: Not on file  . Number of children: Not on file  . Years of education: Not on file  . Highest education level: Not on file  Occupational History  . Occupation: Physiological scientist  Social Needs  . Financial resource strain: Not on file  . Food insecurity    Worry: Not on file    Inability: Not on file  . Transportation needs    Medical: Not on file    Non-medical: Not on file  Tobacco Use  . Smoking status: Former Smoker    Packs/day: 1.00   Years: 10.00    Pack years: 10.00    Types: Cigarettes    Quit date: 01/06/1994    Years since quitting: 24.6  . Smokeless tobacco: Never Used  Substance and Sexual Activity  . Alcohol use: Yes    Alcohol/week: 0.0 standard drinks    Comment: social  . Drug use: No  . Sexual activity: Not on file  Lifestyle  . Physical activity    Days per week: Not on file    Minutes per session: Not on file  . Stress: Not on file  Relationships  . Social Herbalist on phone: Not on file    Gets together: Not on file    Attends religious service: Not on file    Active member of club or organization: Not on file    Attends meetings of clubs or organizations: Not on file    Relationship status: Not on file  Other Topics Concern  . Not on file  Social History Narrative  . Not on file   Outpatient Encounter Medications as of 08/12/2018  Medication Sig  . albuterol (VENTOLIN HFA) 108 (90 Base) MCG/ACT inhaler Inhale 2 puffs into the lungs every 4 (four) hours as needed. (Needs to be seen before next  refill)  . amitriptyline (ELAVIL) 25 MG tablet Take 3 tablets (75 mg total) by mouth at bedtime.  Marland Kitchen azelastine (ASTELIN) 0.1 % nasal spray USE 2 SPRAYS IN EACH NOSTRIL AT BEDTIME  . Blood Glucose Monitoring Suppl (CONTOUR BLOOD GLUCOSE SYSTEM) w/Device KIT Test blood sugars four times daily  . EPINEPHrine (EPI-PEN) 0.3 mg/0.3 mL DEVI Inject 0.3 mg into the skin as needed.   Marland Kitchen esomeprazole (NEXIUM) 40 MG capsule TAKE ONE (1) CAPSULE EACH DAY  . finasteride (PROSCAR) 5 MG tablet Take 1 tablet (5 mg total) by mouth daily. (Needs to be seen before next refill)  . fluticasone (FLONASE) 50 MCG/ACT nasal spray USE 1 TO 2 SPRAYS IN EACH NOSTRIL DAILY  . furosemide (LASIX) 20 MG tablet Take 1 tablet (20 mg total) by mouth daily as needed.  Marland Kitchen glucose blood (CONTOUR NEXT TEST) test strip Test blood sugars four times daily  . liraglutide (VICTOZA) 18 MG/3ML SOPN INJECT 0.3ML SQ DAILY  . losartan (COZAAR)  50 MG tablet TAKE ONE (1) TABLET EACH DAY  . Meclizine HCl 25 MG CHEW Chew 1 tablet (25 mg total) by mouth at bedtime.  . meloxicam (MOBIC) 15 MG tablet Take 15 mg by mouth daily.  . mometasone-formoterol (DULERA) 100-5 MCG/ACT AERO USE 2 PUFFS TWICE DAILY  . montelukast (SINGULAIR) 10 MG tablet TAKE ONE (1) TABLET EACH DAY  . pravastatin (PRAVACHOL) 80 MG tablet TAKE ONE (1) TABLET EACH DAY  . tadalafil (ADCIRCA/CIALIS) 20 MG tablet Take 0.5-1 tablets (10-20 mg total) by mouth every other day as needed for erectile dysfunction.  . tamsulosin (FLOMAX) 0.4 MG CAPS capsule TAKE ONE (1) CAPSULE EACH DAY  . Testosterone 20.25 MG/1.25GM (1.62%) GEL 20.25 mg on each shoulder each morning.  Marland Kitchen VYVANSE 30 MG capsule Take 1 tablet by mouth 2 (two) times daily.  . [DISCONTINUED] HYDROcodone-acetaminophen (NORCO) 5-325 MG tablet 1-2 tabs po q6 hours prn pain  . [DISCONTINUED] Testosterone 20.25 MG/1.25GM (1.62%) GEL Apply 20.25 mg topically every morning.   No facility-administered encounter medications on file as of 08/12/2018.    ALLERGIES: Allergies  Allergen Reactions  . Eggs Or Egg-Derived Products Swelling    Throat  . Septra [Sulfamethoxazole-Trimethoprim] Swelling    throat  . Sulfonamide Derivatives     REACTION: hives    VACCINATION STATUS: Immunization History  Administered Date(s) Administered  . Influenza, Quadrivalent, Recombinant, Inj, Pf 01/15/2017    HPI: Johnny Mathews is a 58 y.o.-year-old man.  He was seen in March 2020 in consultation for hypogonadism requested by his PMD  Dettinger, Fransisca Kaufmann, MD.  He reports that he has hypogonadism for as long as he remembers, does not father any biological children although he adopted 3 kids.   -He was treated with testosterone pellets in a clinic in [place] on 2 separate occasions first ,  2 years ago and last one 1 year ago, the dose in the details are not available to review.  -His previsit labs show significant improvement in his  total testosterone from 32 to 271, using AndroGel 20.25 mg subcutaneously daily.   -He reports improvement in his energy level, and libido. He denies  trauma to testes,  chemotherapy,  testicular irradiation,  nor genitourinary surgery. Denies new complaints since last visit.   He has type 2 diabetes on treatment with Victoza.  His most recent A1c was 5%. -His recent labs show normal CBC, PSA   -  He has asthma on various inhalers. No chronic pain. Not on  opiates, does not take steroids.    He does not have family  Or personal history of premature  cardiac disease.  ROS: Limited as above.  PE: BP 133/76   Pulse 80   Ht _0  (1.727 m)   Wt 192 lb (87.1 kg)   BMI 29.19 kg/m  Wt Readings from Last 3 Encounters:  08/12/18 192 lb (87.1 kg)  08/02/18 190 lb 11.2 oz (86.5 kg)  07/14/18 194 lb 9.6 oz (88.3 kg)    Genital exam: normal male escutcheon, no inguinal LAD, normal phallus, significantly shrunk testes bilaterally to 5 mL, no testicular or scrotal mass lesions.  no penile discharge.  No gynecomastia.   CMP ( most recent) CMP     Component Value Date/Time   NA 140 08/05/2018 1127   K 4.3 08/05/2018 1127   CL 98 08/05/2018 1127   CO2 23 08/05/2018 1127   GLUCOSE 110 (H) 08/05/2018 1127   GLUCOSE 105 (H) 07/29/2018 1036   BUN 16 08/05/2018 1127   CREATININE 1.20 08/05/2018 1127   CALCIUM 9.5 08/05/2018 1127   PROT 7.0 08/05/2018 1127   ALBUMIN 4.6 08/05/2018 1127   AST 27 08/05/2018 1127   ALT 24 08/05/2018 1127   ALKPHOS 106 08/05/2018 1127   BILITOT 0.4 08/05/2018 1127   GFRNONAA 65 08/05/2018 1127   GFRAA 76 08/05/2018 1127     Diabetic Labs (most recent): Lab Results  Component Value Date   HGBA1C 5.0 01/15/2017   HGBA1C 5.2 12/11/2015   HGBA1C 5.2 07/31/2015     Lipid Panel ( most recent) Lipid Panel     Component Value Date/Time   CHOL 202 (H) 01/15/2017 1237   TRIG 63 01/15/2017 1237   HDL 89 01/15/2017 1237   CHOLHDL 2.3 01/15/2017 1237    CHOLHDL 4.8 03/30/2009 1807   VLDL 20 03/30/2009 1807   LDLCALC 100 (H) 01/15/2017 1237    Recent Results (from the past 2160 hour(s))  Basic metabolic panel     Status: Abnormal   Collection Time: 07/29/18 10:36 AM  Result Value Ref Range   Sodium 136 135 - 145 mmol/L   Potassium 4.1 3.5 - 5.1 mmol/L   Chloride 104 98 - 111 mmol/L   CO2 22 22 - 32 mmol/L   Glucose, Bld 105 (H) 70 - 99 mg/dL   BUN 17 6 - 20 mg/dL   Creatinine, Ser 1.07 0.61 - 1.24 mg/dL   Calcium 9.1 8.9 - 10.3 mg/dL   GFR calc non Af Amer >60 >60 mL/min   GFR calc Af Amer >60 >60 mL/min   Anion gap 10 5 - 15    Comment: Performed at [facility], [place] [address], [place], [place] [address]  SARS Coronavirus 2 (Performed in [facility] [place] hospital lab)     Status: None   Collection Time: 07/29/18 10:48 AM   Specimen: Nasal Swab  Result Value Ref Range   SARS Coronavirus 2 NEGATIVE NEGATIVE    Comment: (NOTE) SARS-CoV-2 target nucleic acids are NOT DETECTED. The SARS-CoV-2 RNA is generally detectable in upper and lower respiratory specimens during the acute phase of infection. Negative results do not preclude SARS-CoV-2 infection, do not rule out co-infections with other pathogens, and should not be used as the sole basis for treatment or other patient management decisions. Negative results must be combined with clinical observations, patient history, and epidemiological information. The expected result is Negative. Fact Sheet for Patients: SugarRoll.be Fact Sheet for Healthcare Providers: https://www.woods-mathews.com/ This  test is not yet approved or cleared by the [place] and  has been authorized for detection and/or diagnosis of SARS-CoV-2 by FDA under an Emergency Use Authorization (EUA). This EUA will remain  in effect (meaning this test can be used) for the duration of the COVID-19 declaration under Section 56 4(b)(1) of the Act, 21 U.S.C. section  360bbb-3(b)(1), unless the authorization is terminated or revoked sooner. Performed at [facility], [place] [address], [place], [place] [address]   Glucose, capillary     Status: None   Collection Time: 08/02/18  8:13 AM  Result Value Ref Range   Glucose-Capillary 95 70 - 99 mg/dL  Glucose, capillary     Status: None   Collection Time: 08/02/18 10:48 AM  Result Value Ref Range   Glucose-Capillary 80 70 - 99 mg/dL  CBC with Differential/Platelet     Status: None   Collection Time: 08/05/18 11:27 AM  Result Value Ref Range   WBC 5.3 3.4 - 10.8 x10E3/uL   RBC 4.27 4.14 - 5.80 x10E6/uL   Hemoglobin 13.4 13.0 - 17.7 g/dL   Hematocrit 38.9 37.5 - 51.0 %   MCV 91 79 - 97 fL   MCH 31.4 26.6 - 33.0 pg   MCHC 34.4 31.5 - 35.7 g/dL   RDW 12.4 11.6 - 15.4 %   Platelets 269 150 - 450 x10E3/uL   Neutrophils 59 Not Estab. %   Lymphs 28 Not Estab. %   Monocytes 9 Not Estab. %   Eos 3 Not Estab. %   Basos 1 Not Estab. %   Neutrophils Absolute 3.2 1.4 - 7.0 x10E3/uL   Lymphocytes Absolute 1.5 0.7 - 3.1 x10E3/uL   Monocytes Absolute 0.5 0.1 - 0.9 x10E3/uL   EOS (ABSOLUTE) 0.2 0.0 - 0.4 x10E3/uL   Basophils Absolute 0.0 0.0 - 0.2 x10E3/uL   Immature Granulocytes 0 Not Estab. %   Immature Grans (Abs) 0.0 0.0 - 0.1 x10E3/uL  Comprehensive metabolic panel     Status: Abnormal   Collection Time: 08/05/18 11:27 AM  Result Value Ref Range   Glucose 110 (H) 65 - 99 mg/dL   BUN 16 8 - 27 mg/dL   Creatinine, Ser 1.20 0.76 - 1.27 mg/dL   GFR calc non Af Amer 65 >59 mL/min/1.73   GFR calc Af Amer 76 >59 mL/min/1.73   BUN/Creatinine Ratio 13 10 - 24   Sodium 140 134 - 144 mmol/L   Potassium 4.3 3.5 - 5.2 mmol/L   Chloride 98 96 - 106 mmol/L   CO2 23 20 - 29 mmol/L   Calcium 9.5 8.6 - 10.2 mg/dL   Total Protein 7.0 6.0 - 8.5 g/dL   Albumin 4.6 3.8 - 4.9 g/dL   Globulin, Total 2.4 1.5 - 4.5 g/dL   Albumin/Globulin Ratio 1.9 1.2 - 2.2   Bilirubin Total 0.4 0.0 - 1.2 mg/dL   Alkaline  Phosphatase 106 39 - 117 IU/L   AST 27 0 - 40 IU/L   ALT 24 0 - 44 IU/L  Testosterone Free with SHBG     Status: Abnormal   Collection Time: 08/05/18 11:27 AM  Result Value Ref Range   Testosterone, Serum (Total) 271 ng/dL    Comment: This test was developed and its performance characteristics determined by [facility]. It has not been cleared or approved by the Food and Drug Administration. Reference Range: Adult Males >18 years    38 - 27 This [facility] LC/MS-MS method is currently certified by the [employer]  Hormone Standardization Program (HoST).  Adult male reference interval is based on a population of healthy nonobese males (BMI <30) between 92 and 60 years old. Maximiano Coss 5790,383;3383-2919 PMID: [id number].    % Free Testosterone 0.7 %    Comment: This test was developed and its performance characteristics determined by [facility]. It has not been cleared or approved by the Food and Drug Administration. Reference Range: Adult Males: 1.5 - 3.2    Free Testosterone, S 19 (L) pg/mL    Comment: Reference Range: Adult Males: 33 - 280    Sex Hormone Binding Globulin 85.6 (H) nmol/L    Comment: Reference Range: >49y: 19.3 - 76.4   PSA     Status: None   Collection Time: 08/05/18 11:27 AM  Result Value Ref Range   Prostate Specific Ag, Serum 0.1 0.0 - 4.0 ng/mL    Comment: Roche ECLIA methodology. According to the American Urological Association, Serum PSA should decrease and remain at undetectable levels after radical prostatectomy. The AUA defines biochemical recurrence as an initial PSA value 0.2 ng/mL or greater followed by a subsequent confirmatory PSA value 0.2 ng/mL or greater. Values obtained with different assay methods or kits cannot be used interchangeably. Results cannot be interpreted as absolute evidence of the presence or absence of malignant disease.      ASSESSMENT:  1. Hypogonadism 2.  Type 2 diabetes-A1c of 5%  PLAN:  Patient with adequate  response to AndroGel with total testosterone of 271 improving from 32. -He wishes to be initiated on testosterone replacement therapy. He will benefit from slight increase in his dose.  I discussed and prescribed AndroGel 20.25 mg on each shoulder for 40.5 mg daily dose. -  I also discussed adverse effects of unnecessary testosterone replacement short-term and long-term.   -Given his medical history of sleep apnea and BPH which are relative contraindications for testosterone replacement, his testosterone dose will be titrated slowly based on his clinical response.    -He is already on PDE 5 inhibitors for ED.   When he recent A1c of 5%, he will be considered for treatment without medications for diabetes on his subsequent visit.     Time for this visit: 15 minutes. Johnny Mathews  participated in the discussions, expressed understanding, and voiced agreement with the above plans.  All questions were answered to his satisfaction. he is encouraged to contact clinic should he have any questions or concerns prior to his return visit.  Return in about 4 months (around 12/12/2018) for Follow up with Pre-visit Labs.  Glade Lloyd, MD [facility] [address] [place], [place] [address] Phone: [phone]  Fax: [phone]   08/12/2018, 3:46 PM  This note was partially dictated with voice recognition software. Similar sounding words can be transcribed inadequately or may not  be corrected upon review.

## 2018-08-13 ENCOUNTER — Other Ambulatory Visit: Payer: Self-pay | Admitting: Family Medicine

## 2018-08-24 ENCOUNTER — Other Ambulatory Visit: Payer: Self-pay | Admitting: Family Medicine

## 2018-08-25 MED ORDER — TADALAFIL 20 MG PO TABS: 10.0000 mg | ORAL_TABLET | ORAL | 0 refills | Status: DC | PRN

## 2018-08-25 MED ORDER — FINASTERIDE 5 MG PO TABS: 5.0000 mg | ORAL_TABLET | Freq: Every day | ORAL | 0 refills | Status: DC

## 2018-08-25 NOTE — Telephone Encounter (Signed)
Patient has apt tomorrow but he will be out of town and stats there are no pharamcy's where he will be. Wanting to know if we can do a 30 day supply on the two medications and he will follow up when he is back next week. Please advise and send to pools.

## 2018-08-25 NOTE — Telephone Encounter (Signed)
Rx sent, apt scheduled and patient aware

## 2018-08-25 NOTE — Telephone Encounter (Signed)
Dettinger. NTBS 30 days given 07/27/18

## 2018-08-25 NOTE — Addendum Note (Signed)
Addended by: Nigel Berthold C on: 08/25/2018 03:02 PM   Modules accepted: Orders

## 2018-08-25 NOTE — Telephone Encounter (Signed)
Yes go ahead and do 30-day supply and he will follow-up next week.

## 2018-08-26 ENCOUNTER — Ambulatory Visit: Payer: BC Managed Care – PPO | Admitting: Family Medicine

## 2018-08-28 ENCOUNTER — Encounter: Payer: Self-pay | Admitting: Gastroenterology

## 2018-09-03 DIAGNOSIS — H6691 Otitis media, unspecified, right ear: Secondary | ICD-10-CM | POA: Diagnosis not present

## 2018-09-03 DIAGNOSIS — Z0131 Encounter for examination of blood pressure with abnormal findings: Secondary | ICD-10-CM | POA: Diagnosis not present

## 2018-09-03 DIAGNOSIS — H60501 Unspecified acute noninfective otitis externa, right ear: Secondary | ICD-10-CM | POA: Diagnosis not present

## 2018-09-11 ENCOUNTER — Other Ambulatory Visit: Payer: Self-pay | Admitting: Family Medicine

## 2018-09-17 ENCOUNTER — Other Ambulatory Visit: Payer: Self-pay | Admitting: Family Medicine

## 2018-09-23 ENCOUNTER — Other Ambulatory Visit: Payer: Self-pay

## 2018-09-24 ENCOUNTER — Other Ambulatory Visit: Payer: Self-pay | Admitting: Family Medicine

## 2018-09-24 ENCOUNTER — Ambulatory Visit: Payer: BC Managed Care – PPO | Admitting: Family Medicine

## 2018-09-24 ENCOUNTER — Encounter: Payer: Self-pay | Admitting: Family Medicine

## 2018-09-24 VITALS — BP 125/72 | HR 75 | Temp 98.4°F

## 2018-09-24 DIAGNOSIS — E785 Hyperlipidemia, unspecified: Secondary | ICD-10-CM

## 2018-09-24 DIAGNOSIS — K219 Gastro-esophageal reflux disease without esophagitis: Secondary | ICD-10-CM

## 2018-09-24 DIAGNOSIS — J4520 Mild intermittent asthma, uncomplicated: Secondary | ICD-10-CM

## 2018-09-24 DIAGNOSIS — Z23 Encounter for immunization: Secondary | ICD-10-CM | POA: Diagnosis not present

## 2018-09-24 DIAGNOSIS — Z1159 Encounter for screening for other viral diseases: Secondary | ICD-10-CM

## 2018-09-24 DIAGNOSIS — E119 Type 2 diabetes mellitus without complications: Secondary | ICD-10-CM

## 2018-09-24 DIAGNOSIS — R2241 Localized swelling, mass and lump, right lower limb: Secondary | ICD-10-CM

## 2018-09-24 DIAGNOSIS — I10 Essential (primary) hypertension: Secondary | ICD-10-CM

## 2018-09-24 LAB — BAYER DCA HB A1C WAIVED: HB A1C (BAYER DCA - WAIVED): 5.5 % (ref <7.0)

## 2018-09-24 MED ORDER — ALBUTEROL SULFATE HFA 108 (90 BASE) MCG/ACT IN AERS: 2.0000 | INHALATION_SPRAY | RESPIRATORY_TRACT | 11 refills | Status: DC | PRN

## 2018-09-24 MED ORDER — MELOXICAM 15 MG PO TABS: 15.0000 mg | ORAL_TABLET | Freq: Every day | ORAL | 3 refills | Status: DC

## 2018-09-24 MED ORDER — MECLIZINE HCL 25 MG PO CHEW: 1.0000 | CHEWABLE_TABLET | Freq: Every day | ORAL | 3 refills | Status: DC

## 2018-09-24 MED ORDER — FUROSEMIDE 20 MG PO TABS: 20.0000 mg | ORAL_TABLET | Freq: Every day | ORAL | 3 refills | Status: DC | PRN

## 2018-09-24 MED ORDER — AMITRIPTYLINE HCL 25 MG PO TABS: 75.0000 mg | ORAL_TABLET | Freq: Every day | ORAL | 3 refills | Status: DC

## 2018-09-24 MED ORDER — MONTELUKAST SODIUM 10 MG PO TABS: 10.0000 mg | ORAL_TABLET | Freq: Every day | ORAL | 3 refills | Status: DC

## 2018-09-24 MED ORDER — PRAVASTATIN SODIUM 80 MG PO TABS: 80.0000 mg | ORAL_TABLET | Freq: Every day | ORAL | 3 refills | Status: DC

## 2018-09-24 MED ORDER — FLUTICASONE PROPIONATE 50 MCG/ACT NA SUSP: 1.0000 | Freq: Every day | NASAL | 11 refills | Status: DC

## 2018-09-24 MED ORDER — FINASTERIDE 5 MG PO TABS: 5.0000 mg | ORAL_TABLET | Freq: Every day | ORAL | 3 refills | Status: DC

## 2018-09-24 MED ORDER — AZELASTINE HCL 0.1 % NA SOLN: NASAL | 11 refills | Status: DC

## 2018-09-24 MED ORDER — LOSARTAN POTASSIUM 50 MG PO TABS: 50.0000 mg | ORAL_TABLET | Freq: Every day | ORAL | 3 refills | Status: DC

## 2018-09-24 MED ORDER — TAMSULOSIN HCL 0.4 MG PO CAPS: 0.4000 mg | ORAL_CAPSULE | Freq: Every day | ORAL | 3 refills | Status: DC

## 2018-09-24 MED ORDER — DULERA 100-5 MCG/ACT IN AERO: 2.0000 | INHALATION_SPRAY | Freq: Two times a day (BID) | RESPIRATORY_TRACT | 11 refills | Status: DC

## 2018-09-24 MED ORDER — ESOMEPRAZOLE MAGNESIUM 40 MG PO CPDR: 40.0000 mg | DELAYED_RELEASE_CAPSULE | Freq: Every day | ORAL | 3 refills | Status: DC

## 2018-09-24 MED ORDER — SILDENAFIL CITRATE 20 MG PO TABS: 20.0000 mg | ORAL_TABLET | ORAL | 3 refills | Status: DC | PRN

## 2018-09-24 NOTE — Progress Notes (Addendum)
BP 125/72   Pulse 75   Temp 98.4 F (36.9 C) (Temporal)   SpO2 99%    Subjective:   Patient ID: Johnny Mathews, male    DOB: [birth year], 58 y.o.   MRN: [id number]  HPI: Johnny Mathews is a 58 y.o. male presenting on 09/24/2018 for Medical Management of Chronic Issues   HPI Type 2 diabetes mellitus Patient comes in today for recheck of his diabetes. Patient has been currently taking no medication, stopped his Victoza and he feels like he is doing well but has not checked his blood sugars. Patient is currently on an ACE inhibitor/ARB. Patient has not seen an ophthalmologist this year. Patient denies any issues with their feet.  Refused foot exam today  Hypertension Patient is currently on losartan, and their blood pressure today is 25/72. Patient denies any lightheadedness or dizziness. Patient denies headaches, blurred vision, chest pains, shortness of breath, or weakness. Denies any side effects from medication and is content with current medication.   Hyperlipidemia Patient is coming in for recheck of his hyperlipidemia. The patient is currently taking pravastatin. They deny any issues with myalgias or history of liver damage from it. They deny any focal numbness or weakness or chest pain.   Patient noticed a small knot on his right posterior lateral leg about midway down from his hip to his knee about 2 weeks ago and is very firm and hard and moves a little bit but it is not painful and has no overlying skin changes, he has not noticed it before and does not know if it is new or old.  Relevant past medical, surgical, family and social history reviewed and updated as indicated. Interim medical history since our last visit reviewed. Allergies and medications reviewed and updated.  Review of Systems  Constitutional: Negative for chills and fever.  Eyes: Negative for visual disturbance.  Respiratory: Negative for shortness of breath and wheezing.   Cardiovascular: Negative for chest  pain and leg swelling.  Musculoskeletal: Negative for back pain and gait problem.  Skin: Negative for rash.  Neurological: Negative for dizziness, weakness and light-headedness.  Psychiatric/Behavioral: Negative for decreased concentration, dysphoric mood, self-injury, sleep disturbance and suicidal ideas.  All other systems reviewed and are negative.   Per HPI unless specifically indicated above   Allergies as of 09/24/2018      Reactions   Eggs Or Egg-derived Products Swelling   Throat   Galactose    Septra [sulfamethoxazole-trimethoprim] Swelling   throat   Sulfonamide Derivatives    REACTION: hives      Medication List       Accurate as of September 24, 2018 11:35 AM. If you have any questions, ask your nurse or doctor.        albuterol 108 (90 Base) MCG/ACT inhaler Commonly known as: Ventolin HFA Inhale 2 puffs into the lungs every 4 (four) hours as needed. (Needs to be seen before next refill)   amitriptyline 25 MG tablet Commonly known as: ELAVIL Take 3 tablets (75 mg total) by mouth at bedtime.   azelastine 0.1 % nasal spray Commonly known as: ASTELIN USE 2 SPRAYS IN EACH NOSTRIL AT BEDTIME   Contour Blood Glucose System w/Device Kit Test blood sugars four times daily   Dulera 100-5 MCG/ACT Aero Generic drug: mometasone-formoterol USE 2 PUFFS TWICE DAILY   EPINEPHrine 0.3 mg/0.3 mL Devi Commonly known as: EPI-PEN Inject 0.3 mg into the skin as needed.   esomeprazole 40 MG capsule Commonly known  as: NEXIUM TAKE ONE (1) CAPSULE EACH DAY   finasteride 5 MG tablet Commonly known as: PROSCAR TAKE ONE (1) TABLET EACH DAY   fluticasone 50 MCG/ACT nasal spray Commonly known as: FLONASE USE 1 TO 2 SPRAYS IN EACH NOSTRIL DAILY   furosemide 20 MG tablet Commonly known as: LASIX Take 1 tablet (20 mg total) by mouth daily as needed.   glucose blood test strip Commonly known as: Contour Next Test Test blood sugars four times daily   losartan 50 MG  tablet Commonly known as: COZAAR TAKE ONE (1) TABLET EACH DAY   Meclizine HCl 25 MG Chew Chew 1 tablet (25 mg total) by mouth at bedtime.   meloxicam 15 MG tablet Commonly known as: MOBIC Take 15 mg by mouth daily.   montelukast 10 MG tablet Commonly known as: SINGULAIR TAKE ONE (1) TABLET EACH DAY   pravastatin 80 MG tablet Commonly known as: PRAVACHOL TAKE ONE (1) TABLET EACH DAY   tadalafil 20 MG tablet Commonly known as: CIALIS Take 0.5-1 tablets (10-20 mg total) by mouth every other day as needed for erectile dysfunction.   tamsulosin 0.4 MG Caps capsule Commonly known as: FLOMAX TAKE ONE (1) CAPSULE EACH DAY   Testosterone 20.25 MG/1.25GM (1.62%) Gel 20.25 mg on each shoulder each morning.   Victoza 18 MG/3ML Sopn Generic drug: liraglutide INJECT 0.3ML SQ DAILY   Vyvanse 30 MG capsule Generic drug: lisdexamfetamine Take 1 tablet by mouth 2 (two) times daily.        Objective:   BP 125/72   Pulse 75   Temp 98.4 F (36.9 C) (Temporal)   SpO2 99%   Wt Readings from Last 3 Encounters:  08/12/18 192 lb (87.1 kg)  08/02/18 190 lb 11.2 oz (86.5 kg)  07/14/18 194 lb 9.6 oz (88.3 kg)    Physical Exam Vitals signs and nursing note reviewed.  Constitutional:      General: He is not in acute distress.    Appearance: He is well-developed. He is not diaphoretic.  Eyes:     General: No scleral icterus.    Conjunctiva/sclera: Conjunctivae normal.  Neck:     Musculoskeletal: Neck supple.     Thyroid: No thyromegaly.  Cardiovascular:     Rate and Rhythm: Normal rate and regular rhythm.     Heart sounds: Murmur (Holosystolic murmur best heard at the second left intercostal space, chronic) present.  Pulmonary:     Effort: Pulmonary effort is normal. No respiratory distress.     Breath sounds: Normal breath sounds. No wheezing.  Musculoskeletal: Normal range of motion.       Legs:  Lymphadenopathy:     Cervical: No cervical adenopathy.  Skin:    General:  Skin is warm and dry.     Findings: No rash.  Neurological:     Mental Status: He is alert and oriented to person, place, and time.     Coordination: Coordination normal.  Psychiatric:        Behavior: Behavior normal.     Results for orders placed or performed in visit on 08/05/18  CBC with Differential/Platelet  Result Value Ref Range   WBC 5.3 3.4 - 10.8 x10E3/uL   RBC 4.27 4.14 - 5.80 x10E6/uL   Hemoglobin 13.4 13.0 - 17.7 g/dL   Hematocrit 38.9 37.5 - 51.0 %   MCV 91 79 - 97 fL   MCH 31.4 26.6 - 33.0 pg   MCHC 34.4 31.5 - 35.7 g/dL   RDW 12.4 11.6 -  15.4 %   Platelets 269 150 - 450 x10E3/uL   Neutrophils 59 Not Estab. %   Lymphs 28 Not Estab. %   Monocytes 9 Not Estab. %   Eos 3 Not Estab. %   Basos 1 Not Estab. %   Neutrophils Absolute 3.2 1.4 - 7.0 x10E3/uL   Lymphocytes Absolute 1.5 0.7 - 3.1 x10E3/uL   Monocytes Absolute 0.5 0.1 - 0.9 x10E3/uL   EOS (ABSOLUTE) 0.2 0.0 - 0.4 x10E3/uL   Basophils Absolute 0.0 0.0 - 0.2 x10E3/uL   Immature Granulocytes 0 Not Estab. %   Immature Grans (Abs) 0.0 0.0 - 0.1 x10E3/uL  Comprehensive metabolic panel  Result Value Ref Range   Glucose 110 (H) 65 - 99 mg/dL   BUN 16 8 - 27 mg/dL   Creatinine, Ser 1.20 0.76 - 1.27 mg/dL   GFR calc non Af Amer 65 >59 mL/min/1.73   GFR calc Af Amer 76 >59 mL/min/1.73   BUN/Creatinine Ratio 13 10 - 24   Sodium 140 134 - 144 mmol/L   Potassium 4.3 3.5 - 5.2 mmol/L   Chloride 98 96 - 106 mmol/L   CO2 23 20 - 29 mmol/L   Calcium 9.5 8.6 - 10.2 mg/dL   Total Protein 7.0 6.0 - 8.5 g/dL   Albumin 4.6 3.8 - 4.9 g/dL   Globulin, Total 2.4 1.5 - 4.5 g/dL   Albumin/Globulin Ratio 1.9 1.2 - 2.2   Bilirubin Total 0.4 0.0 - 1.2 mg/dL   Alkaline Phosphatase 106 39 - 117 IU/L   AST 27 0 - 40 IU/L   ALT 24 0 - 44 IU/L  Testosterone Free with SHBG  Result Value Ref Range   Testosterone, Serum (Total) 271 ng/dL   % Free Testosterone 0.7 %   Free Testosterone, S 19 (L) pg/mL   Sex Hormone Binding  Globulin 85.6 (H) nmol/L  PSA  Result Value Ref Range   Prostate Specific Ag, Serum 0.1 0.0 - 4.0 ng/mL    Assessment & Plan:   Problem List Items Addressed This Visit      Cardiovascular and Mediastinum   Essential hypertension   Relevant Medications   furosemide (LASIX) 20 MG tablet   losartan (COZAAR) 50 MG tablet   pravastatin (PRAVACHOL) 80 MG tablet   sildenafil (REVATIO) 20 MG tablet     Respiratory   Asthma, mild intermittent   Relevant Medications   albuterol (VENTOLIN HFA) 108 (90 Base) MCG/ACT inhaler   mometasone-formoterol (DULERA) 100-5 MCG/ACT AERO   montelukast (SINGULAIR) 10 MG tablet     Digestive   GERD (gastroesophageal reflux disease)   Relevant Medications   esomeprazole (NEXIUM) 40 MG capsule   Meclizine HCl 25 MG CHEW     Endocrine   Type 2 diabetes mellitus without complication, without long-term current use of insulin (HCC) - Primary   Relevant Medications   losartan (COZAAR) 50 MG tablet   pravastatin (PRAVACHOL) 80 MG tablet   Other Relevant Orders   Hepatitis C antibody     Other   Hyperlipidemia with target LDL less than 100   Relevant Medications   furosemide (LASIX) 20 MG tablet   losartan (COZAAR) 50 MG tablet   pravastatin (PRAVACHOL) 80 MG tablet   sildenafil (REVATIO) 20 MG tablet    Other Visit Diagnoses    Need for hepatitis C screening test       Relevant Orders   Bayer DCA Hb A1c Waived   Leg mass, right  Relevant Orders   US SOFT TISSUE LOWER EXTREMITY LIMITED RIGHT (NON-VASCULAR)   Hyperlipidemia, unspecified hyperlipidemia type       Relevant Medications   furosemide (LASIX) 20 MG tablet   losartan (COZAAR) 50 MG tablet   pravastatin (PRAVACHOL) 80 MG tablet   sildenafil (REVATIO) 20 MG tablet      Will send for u/s for nodule,  continue current medication, patient self stopped victoza Follow up plan: Return in about 6 months (around 03/24/2019), or if symptoms worsen or fail to improve, for Hypertension  and cholesterol diabetes follow-up.  Counseling provided for all of the vaccine components No orders of the defined types were placed in this encounter.   Caryl Pina, MD [facility] 09/24/2018, 11:35 AM

## 2018-09-25 LAB — HEPATITIS C ANTIBODY: Hep C Virus Ab: <0.1 s/co ratio (ref 0.0–0.9)

## 2018-09-27 ENCOUNTER — Encounter: Payer: Self-pay | Admitting: Family Medicine

## 2018-09-27 MED ORDER — FAMOTIDINE 20 MG PO TABS: 20.0000 mg | ORAL_TABLET | Freq: Two times a day (BID) | ORAL | 3 refills | Status: DC

## 2018-09-28 ENCOUNTER — Telehealth: Payer: Self-pay | Admitting: Family Medicine

## 2018-10-01 ENCOUNTER — Telehealth: Payer: Self-pay | Admitting: Family Medicine

## 2018-10-04 ENCOUNTER — Ambulatory Visit (HOSPITAL_COMMUNITY)
Admission: RE | Admit: 2018-10-04 | Discharge: 2018-10-04 | Disposition: A | Discharge status: Home / Self Care | Payer: BC Managed Care – PPO | Source: Ambulatory Visit | Attending: Family Medicine | Admitting: Family Medicine

## 2018-10-04 ENCOUNTER — Other Ambulatory Visit: Payer: Self-pay

## 2018-10-04 DIAGNOSIS — R2242 Localized swelling, mass and lump, left lower limb: Secondary | ICD-10-CM | POA: Diagnosis not present

## 2018-10-04 DIAGNOSIS — R2241 Localized swelling, mass and lump, right lower limb: Secondary | ICD-10-CM | POA: Insufficient documentation

## 2018-10-06 ENCOUNTER — Encounter: Payer: Self-pay | Admitting: Family Medicine

## 2018-10-07 ENCOUNTER — Telehealth: Payer: Self-pay | Admitting: Family Medicine

## 2018-10-07 DIAGNOSIS — R2241 Localized swelling, mass and lump, right lower limb: Secondary | ICD-10-CM

## 2018-10-07 NOTE — Telephone Encounter (Signed)
Spoke with patient's wife and ordered mri of right leg

## 2018-10-13 ENCOUNTER — Encounter: Payer: Self-pay | Admitting: Family Medicine

## 2018-10-20 ENCOUNTER — Ambulatory Visit (HOSPITAL_COMMUNITY): Payer: BC Managed Care – PPO

## 2018-10-22 ENCOUNTER — Ambulatory Visit (HOSPITAL_COMMUNITY)
Admission: RE | Admit: 2018-10-22 | Discharge: 2018-10-22 | Disposition: A | Discharge status: Home / Self Care | Payer: BC Managed Care – PPO | Source: Ambulatory Visit | Attending: Family Medicine | Admitting: Family Medicine

## 2018-10-22 ENCOUNTER — Other Ambulatory Visit: Payer: Self-pay

## 2018-10-22 DIAGNOSIS — R2241 Localized swelling, mass and lump, right lower limb: Secondary | ICD-10-CM | POA: Insufficient documentation

## 2018-10-22 LAB — POCT I-STAT CREATININE: Creatinine, Ser: 1.1 mg/dL (ref 0.61–1.24)

## 2018-10-22 MED ORDER — GADOBUTROL 1 MMOL/ML IV SOLN
9.0000 mL | Freq: Once | INTRAVENOUS | Status: AC | PRN
  Administered 2018-10-22: 9 mL via INTRAVENOUS

## 2018-10-25 ENCOUNTER — Other Ambulatory Visit: Payer: Self-pay | Admitting: *Deleted

## 2018-10-25 DIAGNOSIS — R2241 Localized swelling, mass and lump, right lower limb: Secondary | ICD-10-CM

## 2018-10-28 ENCOUNTER — Other Ambulatory Visit: Payer: Self-pay | Admitting: Family Medicine

## 2018-10-28 DIAGNOSIS — R2241 Localized swelling, mass and lump, right lower limb: Secondary | ICD-10-CM

## 2018-10-28 NOTE — Progress Notes (Unsigned)
Placed order for 1 month repeat on MRI, if no change or not defined then then may consider surgery referral

## 2018-11-17 ENCOUNTER — Other Ambulatory Visit: Payer: Self-pay | Admitting: "Endocrinology

## 2018-11-25 DIAGNOSIS — Z03818 Encounter for observation for suspected exposure to other biological agents ruled out: Secondary | ICD-10-CM | POA: Diagnosis not present

## 2018-11-25 DIAGNOSIS — Z20828 Contact with and (suspected) exposure to other viral communicable diseases: Secondary | ICD-10-CM | POA: Diagnosis not present

## 2018-11-26 ENCOUNTER — Ambulatory Visit (HOSPITAL_COMMUNITY): Admission: RE | Admit: 2018-11-26 | Payer: BC Managed Care – PPO | Source: Ambulatory Visit

## 2018-12-06 ENCOUNTER — Telehealth: Payer: BC Managed Care – PPO | Admitting: Physician Assistant

## 2018-12-06 DIAGNOSIS — R0602 Shortness of breath: Secondary | ICD-10-CM

## 2018-12-06 NOTE — Progress Notes (Signed)
Based on what you shared with me, I feel your condition warrants further evaluation and I recommend that you be seen for a face to face office visit. I am concerned that you may need to have a chest x-ray and have your vitals checked if you are having so much shortness of breath that your activities are restricted due to symptoms.    NOTE: If you entered your credit card information for this eVisit, you will not be charged. You may see a "hold" on your card for the $35 but that hold will drop off and you will not have a charge processed.   If you are having a true medical emergency please call 911.      For an urgent face to face visit, [facility] has five urgent care centers for your convenience:      NEW:  [facility] [place] at [place] Get Driving Directions [id number] [place], [place] [address] . 10 am - 6pm Monday - Friday    [facility] [place] [facility]) Get Driving Directions [id number] [address] [place], [place] [address] . 10 am to 8 pm Monday-Friday . 12 pm to 8 pm [facility] at [facility] Get Driving Directions [id number] [place], [place], [place] [address] . 8 am to 8 pm Monday-Friday . 9 am to 6 pm Saturday . 11 am to 6 pm Sunday     [facility] at [facility] Get Driving Directions  [id number] [address] [place], [place] [address] . 8 am to 8 pm Monday-Friday . 8 am to 4 pm [facility] at [place] Get Driving Directions [id number] [place] [place],  [address] . 12 pm to 6 pm Monday-Friday      Your e-visit answers were reviewed by a board certified advanced clinical practitioner to complete your personal care plan.  Thank you for using e-Visits.    Approximately 5 minutes was spent documenting and reviewing patient's chart.

## 2018-12-07 ENCOUNTER — Encounter: Payer: Self-pay | Admitting: Family Medicine

## 2018-12-07 ENCOUNTER — Ambulatory Visit (INDEPENDENT_AMBULATORY_CARE_PROVIDER_SITE_OTHER): Payer: BC Managed Care – PPO | Admitting: Family Medicine

## 2018-12-07 DIAGNOSIS — J4521 Mild intermittent asthma with (acute) exacerbation: Secondary | ICD-10-CM

## 2018-12-07 DIAGNOSIS — U071 COVID-19: Secondary | ICD-10-CM

## 2018-12-07 MED ORDER — PREDNISONE 10 MG PO TABS: ORAL_TABLET | ORAL | 0 refills | Status: DC

## 2018-12-07 NOTE — Progress Notes (Signed)
Subjective:    Patient ID: Johnny Mathews, male    DOB: 24-Jun-1960, 58 y.o.   MRN: [id number]   HPI: Johnny Mathews is a 58 y.o. male presenting for follow up of COVID. Still short of breath. Hx of asthma. Dx 2 weeks ago.  Using albuteroll and dulera  Winded just walking from bedroom to kitchen. Copious cough. Nonproductive. Fever left after 7 days. Diarrhea has resolved 3 days ago. "I feel like I've been to war and back." I still feel awful." Has responded to similar attacks in the past using prednisone.   Depression screen [facility] 2/9 09/24/2018 07/14/2018 06/16/2018 02/01/2018 01/26/2018  Decreased Interest 0 0 0 0 0  Down, Depressed, Hopeless 0 0 0 0 0  PHQ - 2 Score 0 0 0 0 0  Altered sleeping - - - - -  Tired, decreased energy - - - - -  Change in appetite - - - - -  Feeling bad or failure about yourself  - - - - -  Trouble concentrating - - - - -  Moving slowly or fidgety/restless - - - - -  Suicidal thoughts - - - - -  PHQ-9 Score - - - - -     Relevant past medical, surgical, family and social history reviewed and updated as indicated.  Interim medical history since our last visit reviewed. Allergies and medications reviewed and updated.  ROS:  Review of Systems  Constitutional: Positive for activity change and fatigue. Negative for fever.  HENT: Positive for congestion and sinus pressure. Negative for ear discharge, ear pain, hearing loss, nosebleeds, rhinorrhea, sneezing and trouble swallowing.   Respiratory: Positive for cough, chest tightness and shortness of breath.   Cardiovascular: Negative for chest pain and palpitations.  Skin: Negative for rash.  Neurological: Positive for headaches.     Social History   Tobacco Use  Smoking Status Former Smoker  . Packs/day: 1.00  . Years: 10.00  . Pack years: 10.00  . Types: Cigarettes  . Quit date: 01/06/1994  . Years since quitting: 24.9  Smokeless Tobacco Never Used       Objective:     Wt Readings from Last 3  Encounters:  08/12/18 192 lb (87.1 kg)  08/02/18 190 lb 11.2 oz (86.5 kg)  07/14/18 194 lb 9.6 oz (88.3 kg)     Exam deferred. Pt. Harboring due to COVID 19. Phone visit performed.   Assessment & Plan:   1. COVID-19 virus infection   2. Mild intermittent asthma with acute exacerbation     Meds ordered this encounter  Medications  . predniSONE (DELTASONE) 10 MG tablet    Sig: Take 5 daily for 2 days followed by 4,3,2 and 1 for 2 days each.    Dispense:  30 tablet    Refill:  0    No orders of the defined types were placed in this encounter.     Diagnoses and all orders for this visit:  COVID-19 virus infection  Mild intermittent asthma with acute exacerbation  Other orders -     predniSONE (DELTASONE) 10 MG tablet; Take 5 daily for 2 days followed by 4,3,2 and 1 for 2 days each.    Virtual Visit via telephone Note  I discussed the limitations, risks, security and privacy concerns of performing an evaluation and management service by telephone and the availability of in person appointments. The patient was identified with two identifiers. Pt.expressed understanding and agreed to proceed. Pt. Is at  home. Dr. Livia Snellen is in his office.  Follow Up Instructions:   I discussed the assessment and treatment plan with the patient. The patient was provided an opportunity to ask questions and all were answered. The patient agreed with the plan and demonstrated an understanding of the instructions.   The patient was advised to call back or seek an in-person evaluation if the symptoms worsen or if the condition fails to improve as anticipated.   Total minutes including chart review and phone contact time: 12  Advised pt. To remain out of work through Dec. 6. Reevaluate if not resolved by then.   Follow up plan: Return if symptoms worsen or fail to improve.  [name], MD [facility]

## 2018-12-13 ENCOUNTER — Other Ambulatory Visit: Payer: Self-pay

## 2018-12-13 ENCOUNTER — Other Ambulatory Visit: Payer: Self-pay | Admitting: "Endocrinology

## 2018-12-13 ENCOUNTER — Ambulatory Visit (INDEPENDENT_AMBULATORY_CARE_PROVIDER_SITE_OTHER): Payer: BC Managed Care – PPO | Admitting: Family Medicine

## 2018-12-13 ENCOUNTER — Encounter: Payer: Self-pay | Admitting: Family Medicine

## 2018-12-13 DIAGNOSIS — J4521 Mild intermittent asthma with (acute) exacerbation: Secondary | ICD-10-CM | POA: Diagnosis not present

## 2018-12-13 DIAGNOSIS — R0602 Shortness of breath: Secondary | ICD-10-CM

## 2018-12-13 DIAGNOSIS — E291 Testicular hypofunction: Secondary | ICD-10-CM | POA: Diagnosis not present

## 2018-12-13 MED ORDER — DEXAMETHASONE 2 MG PO TABS: ORAL_TABLET | ORAL | 0 refills | Status: DC

## 2018-12-13 MED ORDER — AMOXICILLIN-POT CLAVULANATE 875-125 MG PO TABS: 1.0000 | ORAL_TABLET | Freq: Two times a day (BID) | ORAL | 0 refills | Status: DC

## 2018-12-13 NOTE — Progress Notes (Signed)
Virtual Visit via telephone Note  I connected with Johnny Mathews on 12/13/18 at 1708 by telephone and verified that I am speaking with the correct person using two identifiers. Johnny Mathews is currently located at home and no other people are currently with her during visit. The provider, Fransisca Kaufmann Dettinger, MD is located in their office at time of visit.  Call ended at 1715  I discussed the limitations, risks, security and privacy concerns of performing an evaluation and management service by telephone and the availability of in person appointments. I also discussed with the patient that there may be a patient responsible charge related to this service. The patient expressed understanding and agreed to proceed.   History and Present Illness: Patient is calling in for f/u with covid and had it 3 weeks ago and 1 week ago he has been worsening.  He took prednisone 1 week ago. He feels like he has chest tightness and he wants cxr and he is worried about pneumonia.  Patient had been Covid and he had a lot of fevers and myalgias and although the seem to improve but now it just seems like it is getting more and more down into his lungs and worsening down in his lungs.  No diagnosis found.  Outpatient Encounter Medications as of 12/13/2018  Medication Sig  . albuterol (VENTOLIN HFA) 108 (90 Base) MCG/ACT inhaler Inhale 2 puffs into the lungs every 4 (four) hours as needed. (Needs to be seen before next refill)  . amitriptyline (ELAVIL) 25 MG tablet Take 3 tablets (75 mg total) by mouth at bedtime.  Marland Kitchen azelastine (ASTELIN) 0.1 % nasal spray Use in each nostril as directed  . Blood Glucose Monitoring Suppl (CONTOUR BLOOD GLUCOSE SYSTEM) w/Device KIT Test blood sugars four times daily  . EPINEPHrine (EPI-PEN) 0.3 mg/0.3 mL DEVI Inject 0.3 mg into the skin as needed.   Marland Kitchen esomeprazole (NEXIUM) 40 MG capsule Take 1 capsule (40 mg total) by mouth daily at 12 noon.  . famotidine (PEPCID) 20 MG tablet  Take 1 tablet (20 mg total) by mouth 2 (two) times daily.  . finasteride (PROSCAR) 5 MG tablet Take 1 tablet (5 mg total) by mouth daily.  . fluticasone (FLONASE) 50 MCG/ACT nasal spray Place 1-2 sprays into both nostrils daily.  . furosemide (LASIX) 20 MG tablet Take 1 tablet (20 mg total) by mouth daily as needed.  Marland Kitchen glucose blood (CONTOUR NEXT TEST) test strip Test blood sugars four times daily  . losartan (COZAAR) 50 MG tablet Take 1 tablet (50 mg total) by mouth daily.  . Meclizine HCl 25 MG CHEW Chew 1 tablet (25 mg total) by mouth at bedtime.  . meloxicam (MOBIC) 15 MG tablet Take 1 tablet (15 mg total) by mouth daily.  . mometasone-formoterol (DULERA) 100-5 MCG/ACT AERO Inhale 2 puffs into the lungs 2 (two) times daily. USE 2 PUFFS TWICE DAILY  . montelukast (SINGULAIR) 10 MG tablet Take 1 tablet (10 mg total) by mouth at bedtime.  . pravastatin (PRAVACHOL) 80 MG tablet Take 1 tablet (80 mg total) by mouth daily.  . predniSONE (DELTASONE) 10 MG tablet Take 5 daily for 2 days followed by 4,3,2 and 1 for 2 days each.  . sildenafil (REVATIO) 20 MG tablet Take 1-3 tablets (20-60 mg total) by mouth as needed.  . tamsulosin (FLOMAX) 0.4 MG CAPS capsule Take 1 capsule (0.4 mg total) by mouth daily after supper.  . Testosterone 1.62 % GEL APPLY 1 PUMP ON  EACH SHOULDER EVERY MORNING  . Testosterone 20.25 MG/1.25GM (1.62%) GEL 20.25 mg on each shoulder each morning.  . VYVANSE 30 MG capsule Take 1 tablet by mouth 2 (two) times daily.   No facility-administered encounter medications on file as of 12/13/2018.     Review of Systems  Constitutional: Negative for chills and fever.  HENT: Positive for congestion. Negative for ear discharge, ear pain, postnasal drip, rhinorrhea, sinus pressure, sneezing, sore throat and voice change.   Eyes: Negative for pain, discharge, redness and visual disturbance.  Respiratory: Positive for cough, chest tightness, shortness of breath and wheezing.    Cardiovascular: Negative for chest pain and leg swelling.  Gastrointestinal: Negative for abdominal pain, constipation and diarrhea.  Genitourinary: Negative for difficulty urinating.  Musculoskeletal: Positive for myalgias. Negative for back pain and gait problem.  Skin: Negative for rash.  Neurological: Negative for syncope, light-headedness and headaches.  All other systems reviewed and are negative.   Observations/Objective: Patient sounds comfortable and in no acute distress  Assessment and Plan: Problem List Items Addressed This Visit      Respiratory   Asthma, mild intermittent   Relevant Medications   dexamethasone (DECADRON) 2 MG tablet   amoxicillin-clavulanate (AUGMENTIN) 875-125 MG tablet   Other Relevant Orders   DG Chest 2 View    Other Visit Diagnoses    Shortness of breath    -  Primary   Relevant Medications   dexamethasone (DECADRON) 2 MG tablet   amoxicillin-clavulanate (AUGMENTIN) 875-125 MG tablet   Other Relevant Orders   DG Chest 2 View       Follow Up Instructions: Follow-up as needed, recommend he go get outpatient x-ray tomorrow at [facility], have placed the order    I discussed the assessment and treatment plan with the patient. The patient was provided an opportunity to ask questions and all were answered. The patient agreed with the plan and demonstrated an understanding of the instructions.   The patient was advised to call back or seek an in-person evaluation if the symptoms worsen or if the condition fails to improve as anticipated.  The above assessment and management plan was discussed with the patient. The patient verbalized understanding of and has agreed to the management plan. Patient is aware to call the clinic if symptoms persist or worsen. Patient is aware when to return to the clinic for a follow-up visit. Patient educated on when it is appropriate to go to the emergency department.    I provided 7 minutes of non-face-to-face  time during this encounter.    Joshua A Dettinger, MD    

## 2018-12-14 ENCOUNTER — Other Ambulatory Visit: Payer: Self-pay

## 2018-12-14 ENCOUNTER — Ambulatory Visit (HOSPITAL_COMMUNITY)
Admission: RE | Admit: 2018-12-14 | Discharge: 2018-12-14 | Disposition: A | Discharge status: Home / Self Care | Payer: BC Managed Care – PPO | Source: Ambulatory Visit | Attending: Family Medicine | Admitting: Family Medicine

## 2018-12-14 DIAGNOSIS — R0602 Shortness of breath: Secondary | ICD-10-CM | POA: Insufficient documentation

## 2018-12-14 DIAGNOSIS — J4521 Mild intermittent asthma with (acute) exacerbation: Secondary | ICD-10-CM | POA: Diagnosis not present

## 2018-12-15 ENCOUNTER — Ambulatory Visit: Payer: BC Managed Care – PPO | Admitting: "Endocrinology

## 2018-12-15 DIAGNOSIS — U071 COVID-19: Secondary | ICD-10-CM | POA: Diagnosis not present

## 2018-12-15 DIAGNOSIS — R0609 Other forms of dyspnea: Secondary | ICD-10-CM | POA: Diagnosis not present

## 2018-12-15 DIAGNOSIS — Z91018 Allergy to other foods: Secondary | ICD-10-CM | POA: Diagnosis not present

## 2018-12-15 DIAGNOSIS — Z91012 Allergy to eggs: Secondary | ICD-10-CM | POA: Diagnosis not present

## 2018-12-15 DIAGNOSIS — R0602 Shortness of breath: Secondary | ICD-10-CM | POA: Diagnosis not present

## 2018-12-15 DIAGNOSIS — N62 Hypertrophy of breast: Secondary | ICD-10-CM | POA: Diagnosis not present

## 2018-12-15 DIAGNOSIS — D649 Anemia, unspecified: Secondary | ICD-10-CM | POA: Diagnosis not present

## 2018-12-15 DIAGNOSIS — Z91041 Radiographic dye allergy status: Secondary | ICD-10-CM | POA: Diagnosis not present

## 2018-12-15 DIAGNOSIS — E119 Type 2 diabetes mellitus without complications: Secondary | ICD-10-CM | POA: Diagnosis not present

## 2018-12-15 DIAGNOSIS — Z882 Allergy status to sulfonamides status: Secondary | ICD-10-CM | POA: Diagnosis not present

## 2018-12-15 DIAGNOSIS — K449 Diaphragmatic hernia without obstruction or gangrene: Secondary | ICD-10-CM | POA: Diagnosis not present

## 2018-12-15 DIAGNOSIS — J189 Pneumonia, unspecified organism: Secondary | ICD-10-CM | POA: Diagnosis not present

## 2018-12-15 DIAGNOSIS — Z9102 Food additives allergy status: Secondary | ICD-10-CM | POA: Diagnosis not present

## 2018-12-15 LAB — TESTOSTERONE, FREE, TOTAL, SHBG
Sex Hormone Binding: 51.4 nmol/L (ref 19.3–76.4)
Testosterone, Free: 1.4 pg/mL — ABNORMAL LOW (ref 6.6–18.1)
Testosterone: 147 ng/dL — ABNORMAL LOW (ref 264–916)

## 2018-12-16 DIAGNOSIS — R0600 Dyspnea, unspecified: Secondary | ICD-10-CM | POA: Diagnosis not present

## 2018-12-20 ENCOUNTER — Encounter: Payer: Self-pay | Admitting: Family Medicine

## 2018-12-23 ENCOUNTER — Encounter: Payer: Self-pay | Admitting: Family Medicine

## 2018-12-23 DIAGNOSIS — U071 COVID-19: Secondary | ICD-10-CM

## 2018-12-23 DIAGNOSIS — R0602 Shortness of breath: Secondary | ICD-10-CM

## 2018-12-24 ENCOUNTER — Other Ambulatory Visit: Payer: Self-pay | Admitting: "Endocrinology

## 2018-12-24 ENCOUNTER — Telehealth: Payer: Self-pay | Admitting: "Endocrinology

## 2018-12-24 MED ORDER — ALBUTEROL SULFATE (2.5 MG/3ML) 0.083% IN NEBU: 2.5000 mg | INHALATION_SOLUTION | Freq: Four times a day (QID) | RESPIRATORY_TRACT | 1 refills | Status: DC | PRN

## 2018-12-24 NOTE — Telephone Encounter (Signed)
Patient has called and left several VM's and I have returned about 4 of them and left VM's. When calling, it rings once and goes to VM. Patient needs to be back on schedule but I am unable to reach him

## 2018-12-27 ENCOUNTER — Encounter: Payer: Self-pay | Admitting: Family Medicine

## 2018-12-27 ENCOUNTER — Telehealth: Payer: Self-pay | Admitting: Family Medicine

## 2018-12-27 ENCOUNTER — Other Ambulatory Visit: Payer: Self-pay

## 2018-12-27 DIAGNOSIS — R0602 Shortness of breath: Secondary | ICD-10-CM

## 2018-12-27 DIAGNOSIS — U071 COVID-19: Secondary | ICD-10-CM

## 2018-12-28 DIAGNOSIS — R0602 Shortness of breath: Secondary | ICD-10-CM | POA: Diagnosis not present

## 2018-12-28 DIAGNOSIS — Z6828 Body mass index (BMI) 28.0-28.9, adult: Secondary | ICD-10-CM | POA: Diagnosis not present

## 2018-12-28 DIAGNOSIS — B09 Unspecified viral infection characterized by skin and mucous membrane lesions: Secondary | ICD-10-CM | POA: Diagnosis not present

## 2018-12-28 DIAGNOSIS — J45909 Unspecified asthma, uncomplicated: Secondary | ICD-10-CM | POA: Diagnosis not present

## 2018-12-28 DIAGNOSIS — Z8619 Personal history of other infectious and parasitic diseases: Secondary | ICD-10-CM | POA: Diagnosis not present

## 2019-01-01 ENCOUNTER — Other Ambulatory Visit: Payer: Self-pay | Admitting: Family Medicine

## 2019-01-03 DIAGNOSIS — F902 Attention-deficit hyperactivity disorder, combined type: Secondary | ICD-10-CM | POA: Diagnosis not present

## 2019-01-03 DIAGNOSIS — Z79899 Other long term (current) drug therapy: Secondary | ICD-10-CM | POA: Diagnosis not present

## 2019-01-11 ENCOUNTER — Encounter: Payer: Self-pay | Admitting: "Endocrinology

## 2019-01-11 ENCOUNTER — Ambulatory Visit (INDEPENDENT_AMBULATORY_CARE_PROVIDER_SITE_OTHER): Payer: BC Managed Care – PPO | Admitting: "Endocrinology

## 2019-01-11 DIAGNOSIS — E291 Testicular hypofunction: Secondary | ICD-10-CM

## 2019-01-11 NOTE — Progress Notes (Signed)
01/11/2019                                Endocrinology Telehealth Visit Follow up Note -During COVID -19 Pandemic  I connected with [name] on 01/11/2019  by telephone and verified that I am speaking with the correct person using two identifiers. [name], [birth year]. he has verbally consented to this visit. All issues noted in this document were discussed and addressed. The format was not optimal for physical exam.    [name], 59 y.o., male   No chief complaint on file.    Past Medical History:  Diagnosis Date  . Alpha galactosidase deficiency   . Asthma   . BPH (benign prostatic hypertrophy)   . Complication of anesthesia   . Diabetes mellitus without complication ([place])   . GERD (gastroesophageal reflux disease)   . HOH (hard of hearing)   . Hyperlipidemia   . Hypertension   . Muscular dystrophy ([place])   . PONV (postoperative nausea and vomiting)    Past Surgical History:  Procedure Laterality Date  . KIDNEY STONE SURGERY    . KNEE SURGERY Right   . MASS EXCISION Right 08/02/2018   Procedure: EXCISION RIGHT LONG FINGER MASS, DEBRIDEMENT PROXIMAL INTERPHALANGEAL JOINT WITH ROTATION FLAP;  Surgeon: Leanora Cover, MD;  Location: [facility];  Service: Orthopedics;  Laterality: Right;  . SHOULDER OPEN ROTATOR CUFF REPAIR     Social History   Socioeconomic History  . Marital status: Married    Spouse name: Not on file  . Number of children: Not on file  . Years of education: Not on file  . Highest education level: Not on file  Occupational History  . Occupation: Physiological scientist  Tobacco Use  . Smoking status: Former Smoker    Packs/day: 1.00    Years: 10.00    Pack years: 10.00    Types: Cigarettes    Quit date: 01/06/1994    Years since quitting: 25.0  . Smokeless tobacco: Never Used  Substance and Sexual Activity  . Alcohol use: Yes    Alcohol/week: 0.0 standard drinks    Comment: social   . Drug use: No  . Sexual activity: Not on file  Other Topics Concern  . Not on file  Social History Narrative  . Not on file   Social Determinants of Health   Financial Resource Strain:   . Difficulty of Paying Living Expenses: Not on file  Food Insecurity:   . Worried About Charity fundraiser in the Last Year: Not on file  . Ran Out of Food in the Last Year: Not on file  Transportation Needs:   . Lack of Transportation (Medical): Not on file  . Lack of Transportation (Non-Medical): Not on file  Physical Activity:   . Days of Exercise per Week: Not on file  . Minutes of Exercise per Session: Not on file  Stress:   . Feeling of Stress : Not on file  Social Connections:   . Frequency of Communication with Friends and Family: Not on file  . Frequency of Social Gatherings with Friends and Family: Not on file  . Attends Religious Services: Not on file  . Active Member of Clubs or Organizations: Not on file  . Attends Archivist Meetings: Not on file  . Marital Status: Not on file   Outpatient Encounter Medications as of 01/11/2019  Medication Sig  .  albuterol (PROVENTIL) (2.5 MG/3ML) 0.083% nebulizer solution Take 3 mLs (2.5 mg total) by nebulization every 6 (six) hours as needed for wheezing or shortness of breath.  Marland Kitchen albuterol (VENTOLIN HFA) 108 (90 Base) MCG/ACT inhaler Inhale 2 puffs into the lungs every 4 (four) hours as needed. (Needs to be seen before next refill)  . amitriptyline (ELAVIL) 25 MG tablet Take 3 tablets (75 mg total) by mouth at bedtime.  Marland Kitchen amoxicillin-clavulanate (AUGMENTIN) 875-125 MG tablet Take 1 tablet by mouth 2 (two) times daily.  Marland Kitchen azelastine (ASTELIN) 0.1 % nasal spray Use in each nostril as directed  . Blood Glucose Monitoring Suppl (CONTOUR BLOOD GLUCOSE SYSTEM) w/Device KIT Test blood sugars four times daily  . dexamethasone (DECADRON) 2 MG tablet Take 4 tablets for 3 days then 3 tablets for 3 days then 2 tablets for 3 days then 1 tablet  for 3 days  . EPINEPHrine (EPI-PEN) 0.3 mg/0.3 mL DEVI Inject 0.3 mg into the skin as needed.   Marland Kitchen esomeprazole (NEXIUM) 40 MG capsule Take 1 capsule (40 mg total) by mouth daily at 12 noon.  . famotidine (PEPCID) 20 MG tablet TAKE ONE TABLET BY MOUTH TWICE DAILY  . finasteride (PROSCAR) 5 MG tablet Take 1 tablet (5 mg total) by mouth daily.  . fluticasone (FLONASE) 50 MCG/ACT nasal spray Place 1-2 sprays into both nostrils daily.  . furosemide (LASIX) 20 MG tablet Take 1 tablet (20 mg total) by mouth daily as needed.  Marland Kitchen glucose blood (CONTOUR NEXT TEST) test strip Test blood sugars four times daily  . losartan (COZAAR) 50 MG tablet Take 1 tablet (50 mg total) by mouth daily.  . Meclizine HCl 25 MG CHEW Chew 1 tablet (25 mg total) by mouth at bedtime.  . meloxicam (MOBIC) 15 MG tablet Take 1 tablet (15 mg total) by mouth daily.  . mometasone-formoterol (DULERA) 100-5 MCG/ACT AERO Inhale 2 puffs into the lungs 2 (two) times daily. USE 2 PUFFS TWICE DAILY  . montelukast (SINGULAIR) 10 MG tablet Take 1 tablet (10 mg total) by mouth at bedtime.  . pravastatin (PRAVACHOL) 80 MG tablet Take 1 tablet (80 mg total) by mouth daily.  . predniSONE (DELTASONE) 10 MG tablet Take 5 daily for 2 days followed by 4,3,2 and 1 for 2 days each.  . sildenafil (REVATIO) 20 MG tablet Take 1-3 tablets (20-60 mg total) by mouth as needed.  . tamsulosin (FLOMAX) 0.4 MG CAPS capsule Take 1 capsule (0.4 mg total) by mouth daily after supper.  . Testosterone 1.62 % GEL APPLY 1 PUMP ON EACH SHOULDER EVERY MORNING  . VYVANSE 30 MG capsule Take 1 tablet by mouth 2 (two) times daily.   No facility-administered encounter medications on file as of 01/11/2019.   ALLERGIES: Allergies  Allergen Reactions  . Eggs Or Egg-Derived Products Swelling    Throat  . Galactose   . Septra [Sulfamethoxazole-Trimethoprim] Swelling    throat  . Sulfonamide Derivatives     REACTION: hives    VACCINATION STATUS: Immunization History   Administered Date(s) Administered  . Influenza Inj Mdck Quad Pf 09/24/2018  . Influenza, Quadrivalent, Recombinant, Inj, Pf 01/15/2017    HPI: TIMMIE DUGUE is a 59 y.o.-year-old man.  He was seen in March 2020 in  consultation for hypogonadism requested by his PMD  Dettinger, Fransisca Kaufmann, MD.  -He is being engaged in telehealth via telephone for follow-up with repeat testosterone measurements. He reports that he has hypogonadism for as long as he remembers, does not  father any biological children although he adopted 3 kids.   -He was treated with testosterone pellets in a clinic in [place] on 2 separate occasions first ,  2 years ago and last one 1 year ago, the dose in the details are not available to review.   -His previsit labs show lower testosterone last visit, mainly because of interactions in his dosing when he was COVID-19 infection. -He remains on AndroGel 40.50 mg subcutaneously daily-20.25 mg on each shoulders. -He reports improvement in his energy level, and libido. He denies  trauma to testes,  chemotherapy,  testicular irradiation,  nor genitourinary surgery. Denies new complaints since last visit.   He has type 2 diabetes on treatment with Victoza.  His most recent A1c was 5%. -His recent labs show normal CBC, PSA   -  He has asthma on various inhalers. No chronic pain. Not on opiates, does not take steroids.    He does not have family  Or personal history of premature  cardiac disease.  ROS: Limited as above.  PE: There were no vitals taken for this visit. Wt Readings from Last 3 Encounters:  08/12/18 192 lb (87.1 kg)  08/02/18 190 lb 11.2 oz (86.5 kg)  07/14/18 194 lb 9.6 oz (88.3 kg)    Genital exam: normal male escutcheon, no inguinal LAD, normal phallus, significantly shrunk testes bilaterally to 5 mL, no testicular or scrotal mass lesions.  no penile discharge.  No gynecomastia.   CMP ( most recent) CMP     Component Value Date/Time   NA 140  08/05/2018 1127   K 4.3 08/05/2018 1127   CL 98 08/05/2018 1127   CO2 23 08/05/2018 1127   GLUCOSE 110 (H) 08/05/2018 1127   GLUCOSE 105 (H) 07/29/2018 1036   BUN 16 08/05/2018 1127   CREATININE 1.10 10/22/2018 1442   CALCIUM 9.5 08/05/2018 1127   PROT 7.0 08/05/2018 1127   ALBUMIN 4.6 08/05/2018 1127   AST 27 08/05/2018 1127   ALT 24 08/05/2018 1127   ALKPHOS 106 08/05/2018 1127   BILITOT 0.4 08/05/2018 1127   GFRNONAA 65 08/05/2018 1127   GFRAA 76 08/05/2018 1127     Diabetic Labs (most recent): Lab Results  Component Value Date   HGBA1C 5.5 09/24/2018   HGBA1C 5.0 01/15/2017   HGBA1C 5.2 12/11/2015     Lipid Panel ( most recent) Lipid Panel     Component Value Date/Time   CHOL 202 (H) 01/15/2017 1237   TRIG 63 01/15/2017 1237   HDL 89 01/15/2017 1237   CHOLHDL 2.3 01/15/2017 1237   CHOLHDL 4.8 03/30/2009 1807   VLDL 20 03/30/2009 1807   LDLCALC 100 (H) 01/15/2017 1237    Recent Results (from the past 2160 hour(s))  I-STAT creatinine     Status: None   Collection Time: 10/22/18  2:42 PM  Result Value Ref Range   Creatinine, Ser 1.10 0.61 - 1.24 mg/dL  Testosterone, Free, Total, SHBG     Status: Abnormal   Collection Time: 12/13/18 12:16 PM  Result Value Ref Range   Testosterone 147 (L) 264 - 916 ng/dL    Comment: Adult male reference interval is based on a population of healthy nonobese males (BMI <30) between 55 and 38 years old. [place], [place] [phone]. PMID: 19379024.    Testosterone, Free 1.4 (L) 6.6 - 18.1 pg/mL   Sex Hormone Binding 51.4 19.3 - 76.4 nmol/L     ASSESSMENT:  1. Hypogonadism 2.  Type 2 diabetes-A1c of 5%  PLAN:  Patient with inadequate response to AndroGel with total testosterone of   147 dropping from 271.  Historically he did have a testosterone level as low at 32.  -He wishes to be continued on testosterone replacement therapy. He will benefit from slight increase in his dose.  I discussed and prescribed  AndroGel to 60.75 mg daily, 2 pumps on1 pump on another shoulder.  He is advised to alternate shoulders , every other day. -  I also discussed adverse effects of unnecessary testosterone replacement short-term and long-term.   -Given his medical history of sleep apnea and BPH which are relative contraindications for testosterone replacement, his testosterone dose will be titrated slowly based on his clinical response.    -He is already on PDE 5 inhibitors for ED.   When he recent A1c of 5%, he will be considered for treatment without medications for diabetes on his subsequent visit.        - Time spent on this patient care encounter:  25 minutes of which 50% was spent in  counseling and the rest reviewing  his current and  previous labs / studies and medications  doses and developing a plan for long term care. [name]  participated in the discussions, expressed understanding, and voiced agreement with the above plans.  All questions were answered to his satisfaction. he is encouraged to contact clinic should he have any questions or concerns prior to his return visit.   Return in about 3 months (around 04/11/2019), or Fasting before 8AM, for Follow up with Pre-visit Labs.  Glade Lloyd, MD [facility] [address] [place], [place] [address] Phone: [phone]  Fax: [phone]   01/11/2019, 9:35 AM  This note was partially dictated with voice recognition software. Similar sounding words can be transcribed inadequately or may not  be corrected upon review.

## 2019-01-15 DIAGNOSIS — Z23 Encounter for immunization: Secondary | ICD-10-CM | POA: Diagnosis not present

## 2019-01-18 DIAGNOSIS — Z8616 Personal history of COVID-19: Secondary | ICD-10-CM | POA: Insufficient documentation

## 2019-01-18 DIAGNOSIS — R0600 Dyspnea, unspecified: Secondary | ICD-10-CM | POA: Diagnosis not present

## 2019-01-18 DIAGNOSIS — I208 Other forms of angina pectoris: Secondary | ICD-10-CM | POA: Diagnosis not present

## 2019-01-18 DIAGNOSIS — R0609 Other forms of dyspnea: Secondary | ICD-10-CM | POA: Insufficient documentation

## 2019-01-18 DIAGNOSIS — I10 Essential (primary) hypertension: Secondary | ICD-10-CM | POA: Diagnosis not present

## 2019-01-18 DIAGNOSIS — I110 Hypertensive heart disease with heart failure: Secondary | ICD-10-CM | POA: Diagnosis not present

## 2019-01-18 DIAGNOSIS — I5032 Chronic diastolic (congestive) heart failure: Secondary | ICD-10-CM | POA: Insufficient documentation

## 2019-01-22 ENCOUNTER — Other Ambulatory Visit: Payer: Self-pay | Admitting: "Endocrinology

## 2019-01-25 ENCOUNTER — Encounter: Payer: Self-pay | Admitting: Internal Medicine

## 2019-01-25 ENCOUNTER — Other Ambulatory Visit: Payer: Self-pay

## 2019-01-25 ENCOUNTER — Ambulatory Visit: Payer: BC Managed Care – PPO | Admitting: Internal Medicine

## 2019-01-25 ENCOUNTER — Ambulatory Visit (INDEPENDENT_AMBULATORY_CARE_PROVIDER_SITE_OTHER)
Admission: RE | Admit: 2019-01-25 | Discharge: 2019-01-25 | Disposition: A | Discharge status: Home / Self Care | Payer: BC Managed Care – PPO | Source: Ambulatory Visit | Attending: Internal Medicine | Admitting: Internal Medicine

## 2019-01-25 ENCOUNTER — Other Ambulatory Visit: Payer: Self-pay | Admitting: Internal Medicine

## 2019-01-25 VITALS — BP 118/78 | HR 85 | Temp 98.1°F | Ht 68.0 in | Wt 201.2 lb

## 2019-01-25 DIAGNOSIS — J849 Interstitial pulmonary disease, unspecified: Secondary | ICD-10-CM

## 2019-01-25 DIAGNOSIS — R0602 Shortness of breath: Secondary | ICD-10-CM | POA: Diagnosis not present

## 2019-01-25 MED ORDER — PREDNISONE 10 MG PO TABS: ORAL_TABLET | ORAL | 0 refills | Status: AC

## 2019-01-25 NOTE — Progress Notes (Signed)
Synopsis: Referred in Jan 2021 for SOB by Dettinger, Fransisca Kaufmann, MD  Subjective:   PATIENT ID: Johnny Mathews GENDER: male DOB: 03-03-1960, MRN: [id number]  Chief Complaint  Patient presents with  . 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  History of asthma, moderate persistent. -Current regimen: dulera, astelin, albuterol, singulair -Slowly getting better but frustrated by how long it is taking.  Fatigue and MMRC1 dyspnea are biggest problem. -Steroids equivocal benefit but he noticed weight gain taking them. -Works as Audiological scientist. -No fevers, chills.  No pleurisy, hemoptysis, leg swelling. -ROS as below. -Comes here to see what else we can do to speed up post-covid recovery.  -PFTs 2016 benign -Eos 200-300  ROS Positive Symptoms in bold:  Constitutional fevers, chills, weight loss, fatigue, anorexia, malaise  Eyes decreased vision, double vision, eye irritation  Ears, Nose, Mouth, Throat sore throat, trouble swallowing, sinus congestion  Cardiovascular chest pain, paroxysmal nocturnal dyspnea, lower ext edema, palpitations   Respiratory SOB, cough, DOE, hemoptysis, wheezing  Gastrointestinal nausea, vomiting, diarrhea  Genitourinary burning with urination, trouble urinating  Musculoskeletal joint aches, joint swelling, back pain  Integumentary  rashes, skin lesions  Neurological focal weakness, focal numbness, trouble speaking, headaches  Psychiatric depression, anxiety, confusion  Endocrine polyuria, polydipsia, cold intolerance, heat intolerance  Hematologic abnormal bruising, abnormal bleeding, unexplained nose bleeds  Allergic/Immunologic recurrent infections, hives, swollen lymph nodes    Past Medical History:  Diagnosis Date  . Alpha galactosidase deficiency   . Asthma   . BPH (benign prostatic hypertrophy)   . Complication of anesthesia   . Diabetes mellitus without complication ([place])   . GERD  (gastroesophageal reflux disease)   . HOH (hard of hearing)   . Hyperlipidemia   . Hypertension   . Muscular dystrophy ([place])   . PONV (postoperative nausea and vomiting)      Family History  Problem Relation Age of Onset  . Breast cancer Mother   . Heart disease Father   . Breast cancer Sister      Past Surgical History:  Procedure Laterality Date  . KIDNEY STONE SURGERY    . KNEE SURGERY Right   . MASS EXCISION Right 08/02/2018   Procedure: EXCISION RIGHT LONG FINGER MASS, DEBRIDEMENT PROXIMAL INTERPHALANGEAL JOINT WITH ROTATION FLAP;  Surgeon: Leanora Cover, MD;  Location: [facility];  Service: Orthopedics;  Laterality: Right;  . SHOULDER OPEN ROTATOR CUFF REPAIR      Social History   Socioeconomic History  . Marital status: Married    Spouse name: Not on file  . Number of children: Not on file  . Years of education: Not on file  . Highest education level: Not on file  Occupational History  . Occupation: Physiological scientist  Tobacco Use  . Smoking status: Former Smoker    Packs/day: 1.00    Years: 10.00    Pack years: 10.00    Types: Cigarettes    Quit date: 01/06/1994    Years since quitting: 25.0  . Smokeless tobacco: Never Used  Substance and Sexual Activity  . Alcohol use: Yes    Alcohol/week: 0.0 standard drinks    Comment: social  . Drug use: No  . Sexual activity: Not on file  Other Topics Concern  . Not on file  Social History Narrative  . Not on file   Social Determinants of Health   Financial Resource Strain:   . Difficulty of Paying  Living Expenses: Not on file  Food Insecurity:   . Worried About Charity fundraiser in the Last Year: Not on file  . Ran Out of Food in the Last Year: Not on file  Transportation Needs:   . Lack of Transportation (Medical): Not on file  . Lack of Transportation (Non-Medical): Not on file  Physical Activity:   . Days of Exercise per Week: Not on file  . Minutes of Exercise per Session: Not on file    Stress:   . Feeling of Stress : Not on file  Social Connections:   . Frequency of Communication with Friends and Family: Not on file  . Frequency of Social Gatherings with Friends and Family: Not on file  . Attends Religious Services: Not on file  . Active Member of Clubs or Organizations: Not on file  . Attends Archivist Meetings: Not on file  . Marital Status: Not on file  Intimate Partner Violence:   . Fear of Current or Ex-Partner: Not on file  . Emotionally Abused: Not on file  . Physically Abused: Not on file  . Sexually Abused: Not on file     Allergies  Allergen Reactions  . Eggs Or Egg-Derived Products Swelling    Throat  . Galactose   . Septra [Sulfamethoxazole-Trimethoprim] Swelling    throat  . Sulfonamide Derivatives     REACTION: hives     Outpatient Medications Prior to Visit  Medication Sig Dispense Refill  . albuterol (PROVENTIL) (2.5 MG/3ML) 0.083% nebulizer solution Take 3 mLs (2.5 mg total) by nebulization every 6 (six) hours as needed for wheezing or shortness of breath. 150 mL 1  . albuterol (VENTOLIN HFA) 108 (90 Base) MCG/ACT inhaler Inhale 2 puffs into the lungs every 4 (four) hours as needed. (Needs to be seen before next refill) 18 g 11  . amitriptyline (ELAVIL) 25 MG tablet Take 3 tablets (75 mg total) by mouth at bedtime. 270 tablet 3  . azelastine (ASTELIN) 0.1 % nasal spray Use in each nostril as directed 30 mL 11  . Blood Glucose Monitoring Suppl (CONTOUR BLOOD GLUCOSE SYSTEM) w/Device KIT Test blood sugars four times daily 200 each 5  . dexamethasone (DECADRON) 2 MG tablet Take 4 tablets for 3 days then 3 tablets for 3 days then 2 tablets for 3 days then 1 tablet for 3 days 30 tablet 0  . EPINEPHrine (EPI-PEN) 0.3 mg/0.3 mL DEVI Inject 0.3 mg into the skin as needed.     Marland Kitchen esomeprazole (NEXIUM) 40 MG capsule Take 1 capsule (40 mg total) by mouth daily at 12 noon. 90 capsule 3  . famotidine (PEPCID) 20 MG tablet TAKE ONE TABLET BY  MOUTH TWICE DAILY 60 tablet 2  . finasteride (PROSCAR) 5 MG tablet Take 1 tablet (5 mg total) by mouth daily. 90 tablet 3  . fluticasone (FLONASE) 50 MCG/ACT nasal spray Place 1-2 sprays into both nostrils daily. 48 g 11  . furosemide (LASIX) 20 MG tablet Take 1 tablet (20 mg total) by mouth daily as needed. 90 tablet 3  . glucose blood (CONTOUR NEXT TEST) test strip Test blood sugars four times daily 200 each 5  . losartan (COZAAR) 50 MG tablet Take 1 tablet (50 mg total) by mouth daily. 90 tablet 3  . Meclizine HCl 25 MG CHEW Chew 1 tablet (25 mg total) by mouth at bedtime. 90 tablet 3  . meloxicam (MOBIC) 15 MG tablet Take 1 tablet (15 mg total) by mouth daily.  90 tablet 3  . mometasone-formoterol (DULERA) 100-5 MCG/ACT AERO Inhale 2 puffs into the lungs 2 (two) times daily. USE 2 PUFFS TWICE DAILY 13 g 11  . montelukast (SINGULAIR) 10 MG tablet Take 1 tablet (10 mg total) by mouth at bedtime. 90 tablet 3  . pravastatin (PRAVACHOL) 80 MG tablet Take 1 tablet (80 mg total) by mouth daily. 90 tablet 3  . sildenafil (REVATIO) 20 MG tablet Take 1-3 tablets (20-60 mg total) by mouth as needed. 30 tablet 3  . tamsulosin (FLOMAX) 0.4 MG CAPS capsule Take 1 capsule (0.4 mg total) by mouth daily after supper. 90 capsule 3  . Testosterone 1.62 % GEL APPLY 1 PUMP ON EACH SHOULDER EVERY MORNING 75 g 1  . VYVANSE 30 MG capsule Take 1 tablet by mouth 2 (two) times daily.  0  . tadalafil (CIALIS) 20 MG tablet tadalafil 20 mg tablet    . amoxicillin-clavulanate (AUGMENTIN) 875-125 MG tablet Take 1 tablet by mouth 2 (two) times daily. 20 tablet 0  . predniSONE (DELTASONE) 10 MG tablet Take 5 daily for 2 days followed by 4,3,2 and 1 for 2 days each. (Patient not taking: Reported on 01/25/2019) 30 tablet 0   No facility-administered medications prior to visit.     Objective:  GEN: well appearing man in NAD HEENT: MMM, no thrush CV: RRR, ext warm PULM: Clear to auscultation without wheezing or rhonci GI:  Soft, +BS EXT: No edema NEURO: Moves all 4 ext, ambulatory PSYCH: AOx3, good insight SKIN: no rashes    Vitals:   01/25/19 1444  BP: 118/78  Pulse: 85  Temp: 98.1 F (36.7 C)  TempSrc: Temporal  SpO2: 98%  Weight: 201 lb 3.2 oz (91.3 kg)  Height: 5' 8"  (1.727 m)   98% on RA BMI Readings from Last 3 Encounters:  01/25/19 30.59 kg/m  08/12/18 29.19 kg/m  08/02/18 28.57 kg/m   Wt Readings from Last 3 Encounters:  01/25/19 201 lb 3.2 oz (91.3 kg)  08/12/18 192 lb (87.1 kg)  08/02/18 190 lb 11.2 oz (86.5 kg)     CBC    Component Value Date/Time   WBC 5.3 08/05/2018 1127   WBC 5.3 07/20/2013 1007   WBC 6.1 03/30/2009 1035   RBC 4.27 08/05/2018 1127   RBC 4.5 (A) 07/20/2013 1007   RBC 4.95 03/30/2009 1035   HGB 13.4 08/05/2018 1127   HCT 38.9 08/05/2018 1127   PLT 269 08/05/2018 1127   MCV 91 08/05/2018 1127   MCH 31.4 08/05/2018 1127   MCH 29.6 07/20/2013 1007   MCHC 34.4 08/05/2018 1127   MCHC 31.7 (A) 07/20/2013 1007   MCHC 34.1 03/30/2009 1035   RDW 12.4 08/05/2018 1127   LYMPHSABS 1.5 08/05/2018 1127   MONOABS 0.5 03/30/2009 1035   EOSABS 0.2 08/05/2018 1127   BASOSABS 0.0 08/05/2018 1127     Pulmonary Functions Testing Results: PFT Results Latest Ref Rng & Units 02/27/2014  FVC-Pre L 3.58  FVC-Predicted Pre % 74  FVC-Post L 3.64  FVC-Predicted Post % 75  Pre FEV1/FVC % % 89  Post FEV1/FCV % % 91  FEV1-Pre L 3.20  FEV1-Predicted Pre % 86  FEV1-Post L 3.31  DLCO UNC% % 81  DLCO COR %Predicted % 111  TLC L 5.28  TLC % Predicted % 76  RV % Predicted % 77       Assessment & Plan:  DOE and fatigue post COVID 19- c/w post COVID syndrome +/- ILD.  I told him  we would need more time to see which way he was headed.  We discussed pros and cons of a steroid trial- high doses mess his sugar up and make him gain weight quickly,we settled on a low dose trial for a couple months to see if we can speed up recovery although the data are lacking. -  Prednisone 32m/day x 1 mo then 573mday x 1 mo - Increase activity as tolerated - Check baseline CXR, PFT   Update 01/26/19: CXR better but not resolved infiltrates.  Discussed case with Dr. MaVaughan Brownerho will see patient as part of his post COVID potential ILD panel.  Called patient and let him know, all questions answered.   Current Outpatient Medications:  .  albuterol (PROVENTIL) (2.5 MG/3ML) 0.083% nebulizer solution, Take 3 mLs (2.5 mg total) by nebulization every 6 (six) hours as needed for wheezing or shortness of breath., Disp: 150 mL, Rfl: 1 .  albuterol (VENTOLIN HFA) 108 (90 Base) MCG/ACT inhaler, Inhale 2 puffs into the lungs every 4 (four) hours as needed. (Needs to be seen before next refill), Disp: 18 g, Rfl: 11 .  amitriptyline (ELAVIL) 25 MG tablet, Take 3 tablets (75 mg total) by mouth at bedtime., Disp: 270 tablet, Rfl: 3 .  azelastine (ASTELIN) 0.1 % nasal spray, Use in each nostril as directed, Disp: 30 mL, Rfl: 11 .  Blood Glucose Monitoring Suppl (CONTOUR BLOOD GLUCOSE SYSTEM) w/Device KIT, Test blood sugars four times daily, Disp: 200 each, Rfl: 5 .  dexamethasone (DECADRON) 2 MG tablet, Take 4 tablets for 3 days then 3 tablets for 3 days then 2 tablets for 3 days then 1 tablet for 3 days, Disp: 30 tablet, Rfl: 0 .  EPINEPHrine (EPI-PEN) 0.3 mg/0.3 mL DEVI, Inject 0.3 mg into the skin as needed. , Disp: , Rfl:  .  esomeprazole (NEXIUM) 40 MG capsule, Take 1 capsule (40 mg total) by mouth daily at 12 noon., Disp: 90 capsule, Rfl: 3 .  famotidine (PEPCID) 20 MG tablet, TAKE ONE TABLET BY MOUTH TWICE DAILY, Disp: 60 tablet, Rfl: 2 .  finasteride (PROSCAR) 5 MG tablet, Take 1 tablet (5 mg total) by mouth daily., Disp: 90 tablet, Rfl: 3 .  fluticasone (FLONASE) 50 MCG/ACT nasal spray, Place 1-2 sprays into both nostrils daily., Disp: 48 g, Rfl: 11 .  furosemide (LASIX) 20 MG tablet, Take 1 tablet (20 mg total) by mouth daily as needed., Disp: 90 tablet, Rfl: 3 .  glucose blood  (CONTOUR NEXT TEST) test strip, Test blood sugars four times daily, Disp: 200 each, Rfl: 5 .  losartan (COZAAR) 50 MG tablet, Take 1 tablet (50 mg total) by mouth daily., Disp: 90 tablet, Rfl: 3 .  Meclizine HCl 25 MG CHEW, Chew 1 tablet (25 mg total) by mouth at bedtime., Disp: 90 tablet, Rfl: 3 .  meloxicam (MOBIC) 15 MG tablet, Take 1 tablet (15 mg total) by mouth daily., Disp: 90 tablet, Rfl: 3 .  mometasone-formoterol (DULERA) 100-5 MCG/ACT AERO, Inhale 2 puffs into the lungs 2 (two) times daily. USE 2 PUFFS TWICE DAILY, Disp: 13 g, Rfl: 11 .  montelukast (SINGULAIR) 10 MG tablet, Take 1 tablet (10 mg total) by mouth at bedtime., Disp: 90 tablet, Rfl: 3 .  pravastatin (PRAVACHOL) 80 MG tablet, Take 1 tablet (80 mg total) by mouth daily., Disp: 90 tablet, Rfl: 3 .  sildenafil (REVATIO) 20 MG tablet, Take 1-3 tablets (20-60 mg total) by mouth as needed., Disp: 30 tablet, Rfl: 3 .  tamsulosin (  FLOMAX) 0.4 MG CAPS capsule, Take 1 capsule (0.4 mg total) by mouth daily after supper., Disp: 90 capsule, Rfl: 3 .  Testosterone 1.62 % GEL, APPLY 1 PUMP ON EACH SHOULDER EVERY MORNING, Disp: 75 g, Rfl: 1 .  VYVANSE 30 MG capsule, Take 1 tablet by mouth 2 (two) times daily., Disp: , Rfl: 0 .  predniSONE (DELTASONE) 10 MG tablet, Take 1 tablet (10 mg total) by mouth daily with breakfast for 30 days, THEN 0.5 tablets (5 mg total) daily with breakfast., Disp: 45 tablet, Rfl: 0 .  tadalafil (CIALIS) 20 MG tablet, tadalafil 20 mg tablet, Disp: , Rfl:    Candee Furbish, MD [facility] 01/25/2019 4:03 PM

## 2019-01-25 NOTE — Patient Instructions (Addendum)
-   X ray - Prednisone 10mg /day x 1 mo then 5mg /day x 1 mo - Increase activity as tolerated

## 2019-01-28 DIAGNOSIS — I208 Other forms of angina pectoris: Secondary | ICD-10-CM | POA: Diagnosis not present

## 2019-01-28 DIAGNOSIS — I5032 Chronic diastolic (congestive) heart failure: Secondary | ICD-10-CM | POA: Diagnosis not present

## 2019-01-28 DIAGNOSIS — R0600 Dyspnea, unspecified: Secondary | ICD-10-CM | POA: Diagnosis not present

## 2019-01-28 DIAGNOSIS — I110 Hypertensive heart disease with heart failure: Secondary | ICD-10-CM | POA: Diagnosis not present

## 2019-02-01 DIAGNOSIS — R0600 Dyspnea, unspecified: Secondary | ICD-10-CM | POA: Diagnosis not present

## 2019-02-10 ENCOUNTER — Encounter: Payer: Self-pay | Admitting: Family Medicine

## 2019-02-10 DIAGNOSIS — H5210 Myopia, unspecified eye: Secondary | ICD-10-CM | POA: Diagnosis not present

## 2019-02-12 DIAGNOSIS — Z23 Encounter for immunization: Secondary | ICD-10-CM | POA: Diagnosis not present

## 2019-02-22 ENCOUNTER — Ambulatory Visit: Payer: BC Managed Care – PPO | Admitting: Internal Medicine

## 2019-02-24 ENCOUNTER — Other Ambulatory Visit: Payer: Self-pay | Admitting: Family Medicine

## 2019-02-24 DIAGNOSIS — U071 COVID-19: Secondary | ICD-10-CM

## 2019-02-24 DIAGNOSIS — R0602 Shortness of breath: Secondary | ICD-10-CM

## 2019-02-28 ENCOUNTER — Encounter: Payer: Self-pay | Admitting: Pulmonary Disease

## 2019-02-28 ENCOUNTER — Ambulatory Visit: Payer: BC Managed Care – PPO | Admitting: Pulmonary Disease

## 2019-02-28 ENCOUNTER — Other Ambulatory Visit: Payer: Self-pay

## 2019-02-28 DIAGNOSIS — G7100 Muscular dystrophy, unspecified: Secondary | ICD-10-CM | POA: Diagnosis not present

## 2019-02-28 DIAGNOSIS — J849 Interstitial pulmonary disease, unspecified: Secondary | ICD-10-CM | POA: Diagnosis not present

## 2019-02-28 DIAGNOSIS — E1169 Type 2 diabetes mellitus with other specified complication: Secondary | ICD-10-CM | POA: Diagnosis not present

## 2019-02-28 NOTE — Progress Notes (Signed)
Johnny Mathews    [id number]    09-26-1960  Primary Care Physician:Dettinger, Fransisca Kaufmann, MD  Referring Physician: Dettinger, Fransisca Kaufmann, MD [place],  [place] [id number]  Chief complaint: Follow-up for post COVID-19 pneumonia  HPI: 59 year old with history of asthma, hypertension, hyperlipidemia, deafness Diagnosed with COVID-19 in Nov 19 2018.  He was evaluated in the ED and did not require admission. Complains of persistent dyspnea, fatigue since then.  He has symptoms at exertion, no symptoms at rest.  Denies any cough, fevers, chills Has had multiple steroid tapers from his primary care.  Seen by my partner Dr. Tamala Julian last month and given a slow prednisone taper of 10 mg/day for 1 month and then milligrams per day for 1 month.  He cannot tell if there is any change with this therapy.  Does report weight gain with all the prednisone that you received.  Pets: Has a dog.  No cats, birds, farm animal Occupation: Physiological scientist.  Part-time EMT and fireman Exposures: No known exposures.  No mold, hot tub, Jacuzzi.  No down pillows or comforter Smoking history: 10 pack/years.  Quit in 1985 Travel history: No significant travel his Relevant family history: No significant family history of lung disease.    Outpatient Encounter Medications as of 02/28/2019  Medication Sig  . albuterol (PROVENTIL) (2.5 MG/3ML) 0.083% nebulizer solution NEBULIZE 1 VIAL EVERY 6 HOURS AS NEEDED FOR WHEEZING & SHORTNESS OF BREATH  . albuterol (VENTOLIN HFA) 108 (90 Base) MCG/ACT inhaler Inhale 2 puffs into the lungs every 4 (four) hours as needed. (Needs to be seen before next refill)  . amitriptyline (ELAVIL) 25 MG tablet Take 3 tablets (75 mg total) by mouth at bedtime.  Marland Kitchen azelastine (ASTELIN) 0.1 % nasal spray Use in each nostril as directed  . Blood Glucose Monitoring Suppl (CONTOUR BLOOD GLUCOSE SYSTEM) w/Device KIT Test blood sugars four times daily  . EPINEPHrine (EPI-PEN) 0.3 mg/0.3 mL  DEVI Inject 0.3 mg into the skin as needed.   Marland Kitchen esomeprazole (NEXIUM) 40 MG capsule Take 1 capsule (40 mg total) by mouth daily at 12 noon.  . famotidine (PEPCID) 20 MG tablet TAKE ONE TABLET BY MOUTH TWICE DAILY  . finasteride (PROSCAR) 5 MG tablet Take 1 tablet (5 mg total) by mouth daily.  . fluticasone (FLONASE) 50 MCG/ACT nasal spray Place 1-2 sprays into both nostrils daily.  . furosemide (LASIX) 20 MG tablet Take 1 tablet (20 mg total) by mouth daily as needed.  Marland Kitchen glucose blood (CONTOUR NEXT TEST) test strip Test blood sugars four times daily  . losartan (COZAAR) 50 MG tablet Take 1 tablet (50 mg total) by mouth daily.  . Meclizine HCl 25 MG CHEW Chew 1 tablet (25 mg total) by mouth at bedtime.  . meloxicam (MOBIC) 15 MG tablet Take 1 tablet (15 mg total) by mouth daily.  . mometasone-formoterol (DULERA) 100-5 MCG/ACT AERO Inhale 2 puffs into the lungs 2 (two) times daily. USE 2 PUFFS TWICE DAILY  . montelukast (SINGULAIR) 10 MG tablet Take 1 tablet (10 mg total) by mouth at bedtime.  . pravastatin (PRAVACHOL) 80 MG tablet Take 1 tablet (80 mg total) by mouth daily.  . predniSONE (DELTASONE) 10 MG tablet Take 1 tablet (10 mg total) by mouth daily with breakfast for 30 days, THEN 0.5 tablets (5 mg total) daily with breakfast.  . sildenafil (REVATIO) 20 MG tablet TAKE 1-3 TABLETS BY MOUTH AS NEEDED  . tadalafil (  CIALIS) 20 MG tablet tadalafil 20 mg tablet  . tamsulosin (FLOMAX) 0.4 MG CAPS capsule Take 1 capsule (0.4 mg total) by mouth daily after supper.  . Testosterone 1.62 % GEL APPLY 1 PUMP ON EACH SHOULDER EVERY MORNING  . VYVANSE 30 MG capsule Take 1 tablet by mouth 2 (two) times daily.  . [DISCONTINUED] dexamethasone (DECADRON) 2 MG tablet Take 4 tablets for 3 days then 3 tablets for 3 days then 2 tablets for 3 days then 1 tablet for 3 days   No facility-administered encounter medications on file as of 02/28/2019.   Physical Exam: Blood pressure 120/80, pulse 76, temperature 97.7  F (36.5 C), temperature source Temporal, height 5' 8.5" (1.74 m), weight 198 lb 6.4 oz (90 kg), SpO2 98 %. Gen:      No acute distress HEENT:  EOMI, sclera anicteric Neck:     No masses; no thyromegaly Lungs:    Clear to auscultation bilaterally; normal respiratory effort CV:         Regular rate and rhythm; no murmurs Abd:      + bowel sounds; soft, non-tender; no palpable masses, no distension Ext:    No edema; adequate peripheral perfusion Skin:      Warm and dry; no rash Neuro: alert and oriented x 3 Psych: normal mood and affect  Data Reviewed: Imaging: Chest x-ray 12/14/2018-bilateral pulmonary infiltrates Chest x-ray 01/25/2019-persistent patchy infiltrates in the left midlung and right upper lobe.  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  He is currently at 5 mg/day for 1 more month Set realistic expectations that this may take a prolonged recovery Offered referral to pulmonary rehab but he prefers to exercise at home.  Advised gradual exercise regimen Get high-resolution CT, 6-minute walk test He has PFTs already scheduled next month  Asthma Continue Dulera, Singulair Follow PFTs when available  Plan/Recommendations: High-res CT, 6-minute walk Review PFTs when available Continue inhalers Exercise regimen Continue prednisone as directed  This appointment required 50 minutes of patient care (this includes precharting, chart review, review of results, face-to-face care, etc.).  Marshell Garfinkel MD [facility] 02/28/2019, 11:07 AM  CC: Dettinger, Fransisca Kaufmann, MD

## 2019-02-28 NOTE — Patient Instructions (Signed)
We will schedule you for high-resolution CT and 6-minute walk test He already have pulmonary function test schedule Continue prednisone taper as directed I will see you in clinic in 1 month after these tests are completed

## 2019-03-02 ENCOUNTER — Other Ambulatory Visit: Payer: Self-pay

## 2019-03-03 ENCOUNTER — Telehealth: Payer: Self-pay | Admitting: Family Medicine

## 2019-03-03 ENCOUNTER — Ambulatory Visit (INDEPENDENT_AMBULATORY_CARE_PROVIDER_SITE_OTHER): Payer: BC Managed Care – PPO | Admitting: Family Medicine

## 2019-03-03 ENCOUNTER — Other Ambulatory Visit: Payer: Self-pay | Admitting: "Endocrinology

## 2019-03-03 ENCOUNTER — Encounter: Payer: Self-pay | Admitting: Family Medicine

## 2019-03-03 VITALS — BP 137/78 | HR 75 | Temp 98.9°F | Ht 68.5 in | Wt 198.0 lb

## 2019-03-03 DIAGNOSIS — K219 Gastro-esophageal reflux disease without esophagitis: Secondary | ICD-10-CM | POA: Diagnosis not present

## 2019-03-03 DIAGNOSIS — Z0001 Encounter for general adult medical examination with abnormal findings: Secondary | ICD-10-CM

## 2019-03-03 DIAGNOSIS — E785 Hyperlipidemia, unspecified: Secondary | ICD-10-CM

## 2019-03-03 DIAGNOSIS — E119 Type 2 diabetes mellitus without complications: Secondary | ICD-10-CM

## 2019-03-03 DIAGNOSIS — I10 Essential (primary) hypertension: Secondary | ICD-10-CM

## 2019-03-03 DIAGNOSIS — Z1211 Encounter for screening for malignant neoplasm of colon: Secondary | ICD-10-CM

## 2019-03-03 DIAGNOSIS — Z0000 Encounter for general adult medical examination without abnormal findings: Secondary | ICD-10-CM

## 2019-03-03 LAB — BAYER DCA HB A1C WAIVED: HB A1C (BAYER DCA - WAIVED): 5.4 % (ref <7.0)

## 2019-03-03 MED ORDER — VICTOZA 18 MG/3ML ~~LOC~~ SOPN: PEN_INJECTOR | SUBCUTANEOUS | 0 refills | Status: DC

## 2019-03-03 MED ORDER — FAMOTIDINE 20 MG PO TABS: 20.0000 mg | ORAL_TABLET | Freq: Two times a day (BID) | ORAL | 3 refills | Status: DC

## 2019-03-03 MED ORDER — VICTOZA 18 MG/3ML ~~LOC~~ SOPN: 1.8000 mg | PEN_INJECTOR | Freq: Every day | SUBCUTANEOUS | 3 refills | Status: DC

## 2019-03-03 NOTE — Progress Notes (Signed)
BP 137/78   Pulse 75   Temp 98.9 F (37.2 C) (Temporal)   Ht 5' 8.5" (1.74 m)   Wt 198 lb (89.8 kg)   SpO2 98%   BMI 29.67 kg/m    Subjective:   Patient ID: Johnny Mathews, male    DOB: 1960/10/31, 59 y.o.   MRN: [id number]  HPI: Johnny Mathews is a 59 y.o. male presenting on 03/03/2019 for Annual Exam   HPI  Patient comes in for physical and recheck of chronic issues  Type 2 diabetes mellitus Patient comes in today for recheck of his diabetes. Patient has been currently taking no medication currently, had been on Victoza before and wants to restart it, he feels like his blood sugars are running up and his weight is going up as well. Patient is currently on an ACE inhibitor/ARB. Patient has not seen an ophthalmologist this year. Patient denies any issues with their feet.   Hypertension Patient is currently on losartan, and their blood pressure today is 137/78. Patient denies any lightheadedness or dizziness. Patient denies headaches, blurred vision, chest pains, shortness of breath, or weakness. Denies any side effects from medication and is content with current medication.   Hyperlipidemia Patient is coming in for recheck of his hyperlipidemia. The patient is currently taking pravastatin. They deny any issues with myalgias or history of liver damage from it. They deny any focal numbness or weakness or chest pain.   GERD Patient is currently on Pepcid.  She denies any major symptoms or abdominal pain or belching or burping. She denies any blood in her stool or lightheadedness or dizziness.   Relevant past medical, surgical, family and social history reviewed and updated as indicated. Interim medical history since our last visit reviewed. Allergies and medications reviewed and updated.  Review of Systems  Constitutional: Negative for chills and fever.  Eyes: Negative for visual disturbance.  Respiratory: Negative for shortness of breath and wheezing.   Cardiovascular:  Negative for chest pain and leg swelling.  Musculoskeletal: Negative for back pain and gait problem.  Skin: Negative for rash.  Neurological: Negative for dizziness, weakness and numbness.  All other systems reviewed and are negative.   Per HPI unless specifically indicated above   Allergies as of 03/03/2019      Reactions   Eggs Or Egg-derived Products Swelling   Throat   Galactose    Septra [sulfamethoxazole-trimethoprim] Swelling   throat   Sulfonamide Derivatives    REACTION: hives      Medication List       Accurate as of March 03, 2019  2:54 PM. If you have any questions, ask your nurse or doctor.        albuterol 108 (90 Base) MCG/ACT inhaler Commonly known as: Ventolin HFA Inhale 2 puffs into the lungs every 4 (four) hours as needed. (Needs to be seen before next refill)   albuterol (2.5 MG/3ML) 0.083% nebulizer solution Commonly known as: PROVENTIL NEBULIZE 1 VIAL EVERY 6 HOURS AS NEEDED FOR WHEEZING & SHORTNESS OF BREATH   amitriptyline 25 MG tablet Commonly known as: ELAVIL Take 3 tablets (75 mg total) by mouth at bedtime.   azelastine 0.1 % nasal spray Commonly known as: ASTELIN Use in each nostril as directed   Contour Blood Glucose System w/Device Kit Test blood sugars four times daily   Dulera 100-5 MCG/ACT Aero Generic drug: mometasone-formoterol Inhale 2 puffs into the lungs 2 (two) times daily. USE 2 PUFFS TWICE DAILY   EPINEPHrine  0.3 mg/0.3 mL Devi Commonly known as: EPI-PEN Inject 0.3 mg into the skin as needed.   esomeprazole 40 MG capsule Commonly known as: NEXIUM Take 1 capsule (40 mg total) by mouth daily at 12 noon.   famotidine 20 MG tablet Commonly known as: PEPCID Take 1 tablet (20 mg total) by mouth 2 (two) times daily.   finasteride 5 MG tablet Commonly known as: PROSCAR Take 1 tablet (5 mg total) by mouth daily.   fluticasone 50 MCG/ACT nasal spray Commonly known as: FLONASE Place 1-2 sprays into both nostrils  daily.   furosemide 20 MG tablet Commonly known as: LASIX Take 1 tablet (20 mg total) by mouth daily as needed.   glucose blood test strip Commonly known as: Contour Next Test Test blood sugars four times daily   losartan 50 MG tablet Commonly known as: COZAAR Take 1 tablet (50 mg total) by mouth daily.   Meclizine HCl 25 MG Chew Chew 1 tablet (25 mg total) by mouth at bedtime.   meloxicam 15 MG tablet Commonly known as: MOBIC Take 1 tablet (15 mg total) by mouth daily.   montelukast 10 MG tablet Commonly known as: SINGULAIR Take 1 tablet (10 mg total) by mouth at bedtime.   pravastatin 80 MG tablet Commonly known as: PRAVACHOL Take 1 tablet (80 mg total) by mouth daily.   predniSONE 10 MG tablet Commonly known as: DELTASONE Take 1 tablet (10 mg total) by mouth daily with breakfast for 30 days, THEN 0.5 tablets (5 mg total) daily with breakfast. Start taking on: January 25, 2019   sildenafil 20 MG tablet Commonly known as: REVATIO TAKE 1-3 TABLETS BY MOUTH AS NEEDED   tadalafil 20 MG tablet Commonly known as: CIALIS tadalafil 20 mg tablet   tamsulosin 0.4 MG Caps capsule Commonly known as: FLOMAX Take 1 capsule (0.4 mg total) by mouth daily after supper.   Testosterone 1.62 % Gel APPLY 1 PUMP ON EACH SHOULDER EVERY MORNING   Victoza 18 MG/3ML Sopn Generic drug: liraglutide Start 0.61m SQ once a day for 7 days, then increase to 1.259monce a day Started by: JoWorthy RancherMD   Victoza 18 MG/3ML Sopn Generic drug: liraglutide Inject 0.3 mLs (1.8 mg total) into the skin daily. Started by: JoFransisca Kaufmannettinger, MD   Vyvanse 30 MG capsule Generic drug: lisdexamfetamine Take 1 tablet by mouth 2 (two) times daily.        Objective:   BP 137/78   Pulse 75   Temp 98.9 F (37.2 C) (Temporal)   Ht 5' 8.5" (1.74 m)   Wt 198 lb (89.8 kg)   SpO2 98%   BMI 29.67 kg/m   Wt Readings from Last 3 Encounters:  03/03/19 198 lb (89.8 kg)  02/28/19 198 lb  6.4 oz (90 kg)  01/25/19 201 lb 3.2 oz (91.3 kg)    Physical Exam Vitals and nursing note reviewed.  Constitutional:      General: He is not in acute distress.    Appearance: He is well-developed. He is not diaphoretic.  Eyes:     General: No scleral icterus.    Conjunctiva/sclera: Conjunctivae normal.  Neck:     Thyroid: No thyromegaly.  Cardiovascular:     Rate and Rhythm: Normal rate and regular rhythm.     Heart sounds: Normal heart sounds. No murmur.  Pulmonary:     Effort: Pulmonary effort is normal. No respiratory distress.     Breath sounds: Normal breath sounds. No wheezing.  Musculoskeletal:        General: Normal range of motion.     Cervical back: Neck supple.  Lymphadenopathy:     Cervical: No cervical adenopathy.  Skin:    General: Skin is warm and dry.     Findings: No rash.  Neurological:     Mental Status: He is alert and oriented to person, place, and time.     Coordination: Coordination normal.  Psychiatric:        Behavior: Behavior normal.       Assessment & Plan:   Problem List Items Addressed This Visit      Cardiovascular and Mediastinum   Essential hypertension     Digestive   GERD (gastroesophageal reflux disease)   Relevant Medications   famotidine (PEPCID) 20 MG tablet   Other Relevant Orders   CBC with Differential/Platelet     Endocrine   Type 2 diabetes mellitus without complication, without long-term current use of insulin (HCC)   Relevant Medications   liraglutide (VICTOZA) 18 MG/3ML SOPN   liraglutide (VICTOZA) 18 MG/3ML SOPN   Other Relevant Orders   Bayer DCA Hb A1c Waived   CMP14+EGFR     Other   Hyperlipidemia with target LDL less than 100   Relevant Orders   Lipid panel    Other Visit Diagnoses    Well adult exam    -  Primary   Colon cancer screening       Relevant Orders   Ambulatory referral to Gastroenterology      Patient comes in for recheck of chronic issues, will restart Victoza, continue other  medications, referral to gastroenterology Follow up plan: Return in about 6 months (around 08/31/2019), or if symptoms worsen or fail to improve, for Recheck hypertension and cholesterol and diabetes.  Counseling provided for all of the vaccine components Orders Placed This Encounter  Procedures  . Bayer DCA Hb A1c Waived  . CBC with Differential/Platelet  . CMP14+EGFR  . Lipid panel  . Ambulatory referral to Gastroenterology    Caryl Pina, MD [facility] 03/03/2019, 2:54 PM

## 2019-03-03 NOTE — Telephone Encounter (Signed)
Pharmacy called requesting that we resend Rx's for Victoza. Says the dispense we sent for both Rx's are in pen form and need to be in ML form.  (3 Pen= 9 ML and 2 Pen= 6 ML)

## 2019-03-03 NOTE — Telephone Encounter (Signed)
Since is the Victoza prescriptions again

## 2019-03-04 ENCOUNTER — Encounter: Payer: Self-pay | Admitting: Family Medicine

## 2019-03-04 ENCOUNTER — Other Ambulatory Visit: Payer: Self-pay | Admitting: "Endocrinology

## 2019-03-04 DIAGNOSIS — E119 Type 2 diabetes mellitus without complications: Secondary | ICD-10-CM

## 2019-03-04 LAB — CMP14+EGFR
ALT: 30 IU/L (ref 0–44)
AST: 30 IU/L (ref 0–40)
Albumin/Globulin Ratio: 2 (ref 1.2–2.2)
Albumin: 4.4 g/dL (ref 3.8–4.9)
Alkaline Phosphatase: 104 IU/L (ref 39–117)
BUN/Creatinine Ratio: 14 (ref 10–24)
BUN: 16 mg/dL (ref 8–27)
Bilirubin Total: 0.3 mg/dL (ref 0.0–1.2)
CO2: 24 mmol/L (ref 20–29)
Calcium: 9.6 mg/dL (ref 8.6–10.2)
Chloride: 105 mmol/L (ref 96–106)
Creatinine, Ser: 1.16 mg/dL (ref 0.76–1.27)
GFR calc Af Amer: 79 mL/min/{1.73_m2} (ref >59)
GFR calc non Af Amer: 68 mL/min/{1.73_m2} (ref >59)
Globulin, Total: 2.2 g/dL (ref 1.5–4.5)
Glucose: 94 mg/dL (ref 65–99)
Potassium: 4.1 mmol/L (ref 3.5–5.2)
Sodium: 142 mmol/L (ref 134–144)
Total Protein: 6.6 g/dL (ref 6.0–8.5)

## 2019-03-04 LAB — LIPID PANEL
Chol/HDL Ratio: 2.2 ratio (ref 0.0–5.0)
Cholesterol, Total: 183 mg/dL (ref 100–199)
HDL: 82 mg/dL (ref >39)
LDL Chol Calc (NIH): 85 mg/dL (ref 0–99)
Triglycerides: 89 mg/dL (ref 0–149)
VLDL Cholesterol Cal: 16 mg/dL (ref 5–40)

## 2019-03-04 LAB — CBC WITH DIFFERENTIAL/PLATELET
Basophils Absolute: 0 10*3/uL (ref 0.0–0.2)
Basos: 0 %
EOS (ABSOLUTE): 0 10*3/uL (ref 0.0–0.4)
Eos: 0 %
Hematocrit: 35.9 % — ABNORMAL LOW (ref 37.5–51.0)
Hemoglobin: 12.7 g/dL — ABNORMAL LOW (ref 13.0–17.7)
Immature Grans (Abs): 0 10*3/uL (ref 0.0–0.1)
Immature Granulocytes: 0 %
Lymphocytes Absolute: 1 10*3/uL (ref 0.7–3.1)
Lymphs: 17 %
MCH: 32.4 pg (ref 26.6–33.0)
MCHC: 35.4 g/dL (ref 31.5–35.7)
MCV: 92 fL (ref 79–97)
Monocytes Absolute: 0.4 10*3/uL (ref 0.1–0.9)
Monocytes: 6 %
Neutrophils Absolute: 4.3 10*3/uL (ref 1.4–7.0)
Neutrophils: 77 %
Platelets: 267 10*3/uL (ref 150–450)
RBC: 3.92 x10E6/uL — ABNORMAL LOW (ref 4.14–5.80)
RDW: 13.2 % (ref 11.6–15.4)
WBC: 5.7 10*3/uL (ref 3.4–10.8)

## 2019-03-04 MED ORDER — OZEMPIC (0.25 OR 0.5 MG/DOSE) 2 MG/1.5ML ~~LOC~~ SOPN: 0.5000 mg | PEN_INJECTOR | SUBCUTANEOUS | 3 refills | Status: DC

## 2019-03-06 MED ORDER — TRULICITY 0.75 MG/0.5ML ~~LOC~~ SOAJ: 0.7500 mg | SUBCUTANEOUS | 3 refills | Status: DC

## 2019-03-07 ENCOUNTER — Encounter: Payer: Self-pay | Admitting: Gastroenterology

## 2019-03-08 ENCOUNTER — Telehealth: Payer: Self-pay | Admitting: *Deleted

## 2019-03-08 NOTE — Telephone Encounter (Signed)
Prior Auth for Trulicity 0.75MG /0.5ML pen-injectors- APPROVED 03/08/2019-03/06/2020  Key: [id number]

## 2019-03-10 ENCOUNTER — Other Ambulatory Visit: Payer: Self-pay | Admitting: Family Medicine

## 2019-03-10 DIAGNOSIS — H5213 Myopia, bilateral: Secondary | ICD-10-CM | POA: Diagnosis not present

## 2019-03-14 ENCOUNTER — Ambulatory Visit (HOSPITAL_COMMUNITY)
Admission: RE | Admit: 2019-03-14 | Discharge: 2019-03-14 | Disposition: A | Discharge status: Home / Self Care | Payer: BC Managed Care – PPO | Source: Ambulatory Visit | Attending: Pulmonary Disease | Admitting: Pulmonary Disease

## 2019-03-14 ENCOUNTER — Other Ambulatory Visit: Payer: Self-pay

## 2019-03-14 DIAGNOSIS — J849 Interstitial pulmonary disease, unspecified: Secondary | ICD-10-CM | POA: Diagnosis not present

## 2019-03-14 DIAGNOSIS — R918 Other nonspecific abnormal finding of lung field: Secondary | ICD-10-CM | POA: Diagnosis not present

## 2019-03-18 ENCOUNTER — Ambulatory Visit (INDEPENDENT_AMBULATORY_CARE_PROVIDER_SITE_OTHER): Payer: BC Managed Care – PPO

## 2019-03-18 ENCOUNTER — Ambulatory Visit: Payer: BC Managed Care – PPO | Admitting: Pulmonary Disease

## 2019-03-18 ENCOUNTER — Other Ambulatory Visit (HOSPITAL_COMMUNITY)
Admission: RE | Admit: 2019-03-18 | Discharge: 2019-03-18 | Disposition: A | Discharge status: Home / Self Care | Payer: BC Managed Care – PPO | Source: Ambulatory Visit | Attending: Internal Medicine | Admitting: Internal Medicine

## 2019-03-18 ENCOUNTER — Encounter: Payer: Self-pay | Admitting: Pulmonary Disease

## 2019-03-18 ENCOUNTER — Other Ambulatory Visit: Payer: Self-pay

## 2019-03-18 DIAGNOSIS — J849 Interstitial pulmonary disease, unspecified: Secondary | ICD-10-CM

## 2019-03-18 DIAGNOSIS — I208 Other forms of angina pectoris: Secondary | ICD-10-CM | POA: Diagnosis not present

## 2019-03-18 DIAGNOSIS — I5032 Chronic diastolic (congestive) heart failure: Secondary | ICD-10-CM

## 2019-03-18 NOTE — Patient Instructions (Signed)
I reviewed the CT which shows that the changes of Covid are improving overall Continue your inhalers and exercise regimen Follow-up in 3 months

## 2019-03-18 NOTE — Progress Notes (Signed)
03/18/19 1025  Six Minute Walk   Medications taken before test (dose and time) finasteride 5mg , nexium 40mg , flonase 35mcg, furosemide 20mg , meloxicam 7.5mg  all taken at 6am. Supplemental oxygen during test? No Lap distance in meters 34 meters Laps Completed 16 Partial lap (in meters) 0 meters Baseline BP (sitting) 116/70 Baseline Heartrate 78 Baseline Dyspnea (Borg Scale) 2 Baseline Fatigue (Borg Scale) 3 Baseline SPO2 99 %  Interval Oxygen Saturation and HR   2 Minute Oxygen Saturation % -- 2 Minute HR -- 4 Minute Oxygen Saturation % -- 4 Minute HR -- 6 Minute Oxygen Saturation % -- 6 Minute HR --  End of Test Values   BP (sitting) 124/80 Heartrate 96 Dyspnea (Borg Scale) 3 Fatigue (Borg Scale) 3 SPO2 99 %  2 Minutes Post Walk Values   BP (sitting) 120/70 Heartrate 85 SPO2 99 % Stopped or paused before six minutes? No Other Symptoms at end of exercise: --  Interpretation   Distance completed 544 meters Tech Comments: Pt walked at a moderate pace without having to stop during the walk. Pt denied any complaints. Provider Comments: --

## 2019-03-18 NOTE — Progress Notes (Addendum)
Johnny Mathews    [id number]    1960/11/08  Primary Care Physician:Dettinger, Fransisca Kaufmann, MD  Referring Physician: Dettinger, Fransisca Kaufmann, MD [place],  [place] [address]  Chief complaint: Follow-up for post COVID-19 pneumonia  HPI: 59 year old with history of asthma, hypertension, hyperlipidemia, deafness Diagnosed with COVID-19 in Nov 19 2018.  He was evaluated in the ED and did not require admission. Complains of persistent dyspnea, fatigue since then.  He has symptoms at exertion, no symptoms at rest.  Denies any cough, fevers, chills Has had multiple steroid tapers from his primary care.  Seen by my partner Dr. Tamala Julian last month and given a slow prednisone taper of 10 mg/day for 1 month and then milligrams per day for 1 month.  He cannot tell if there is any change with this therapy.  Does report weight gain with all the prednisone that you received.  Pets: Has a dog.  No cats, birds, farm animal Occupation: Physiological scientist.  Part-time EMT and fireman Exposures: No known exposures.  No mold, hot tub, Jacuzzi.  No down pillows or comforter Smoking history: 10 pack/years.  Quit in 1985 Travel history: No significant travel his Relevant family history: No significant family history of lung disease.    Outpatient Encounter Medications as of 03/18/2019  Medication Sig  . albuterol (PROVENTIL) (2.5 MG/3ML) 0.083% nebulizer solution NEBULIZE 1 VIAL EVERY 6 HOURS AS NEEDED FOR WHEEZING & SHORTNESS OF BREATH  . albuterol (VENTOLIN HFA) 108 (90 Base) MCG/ACT inhaler Inhale 2 puffs into the lungs every 4 (four) hours as needed. (Needs to be seen before next refill)  . amitriptyline (ELAVIL) 25 MG tablet Take 3 tablets (75 mg total) by mouth at bedtime.  Marland Kitchen azelastine (ASTELIN) 0.1 % nasal spray Use in each nostril as directed  . Blood Glucose Monitoring Suppl (CONTOUR BLOOD GLUCOSE SYSTEM) w/Device KIT Test blood sugars four times daily  . Dulaglutide (TRULICITY) 2.58  NI/7.7OE SOPN Inject 0.75 mg into the skin once a week.  Marland Kitchen EPINEPHrine (EPI-PEN) 0.3 mg/0.3 mL DEVI Inject 0.3 mg into the skin as needed.   Marland Kitchen esomeprazole (NEXIUM) 40 MG capsule Take 1 capsule (40 mg total) by mouth daily at 12 noon.  . famotidine (PEPCID) 20 MG tablet Take 1 tablet (20 mg total) by mouth 2 (two) times daily.  . finasteride (PROSCAR) 5 MG tablet Take 1 tablet (5 mg total) by mouth daily.  . fluticasone (FLONASE) 50 MCG/ACT nasal spray Place 1-2 sprays into both nostrils daily.  . furosemide (LASIX) 20 MG tablet Take 1 tablet (20 mg total) by mouth daily as needed.  Marland Kitchen glucose blood (CONTOUR NEXT TEST) test strip Test blood sugars four times daily  . losartan (COZAAR) 50 MG tablet Take 1 tablet (50 mg total) by mouth daily.  . Meclizine HCl 25 MG CHEW Chew 1 tablet (25 mg total) by mouth at bedtime.  . meloxicam (MOBIC) 7.5 MG tablet TAKE ONE (1) TABLET EACH DAY  . mometasone-formoterol (DULERA) 100-5 MCG/ACT AERO Inhale 2 puffs into the lungs 2 (two) times daily. USE 2 PUFFS TWICE DAILY  . montelukast (SINGULAIR) 10 MG tablet Take 1 tablet (10 mg total) by mouth at bedtime.  . pravastatin (PRAVACHOL) 80 MG tablet Take 1 tablet (80 mg total) by mouth daily.  . predniSONE (DELTASONE) 10 MG tablet Take 1 tablet (10 mg total) by mouth daily with breakfast for 30 days, THEN 0.5 tablets (5 mg total) daily with  breakfast.  . Semaglutide,0.25 or 0.5MG/DOS, (OZEMPIC, 0.25 OR 0.5 MG/DOSE,) 2 MG/1.5ML SOPN Inject 0.5 mg into the skin once a week.  . sildenafil (REVATIO) 20 MG tablet TAKE 1-3 TABLETS BY MOUTH AS NEEDED  . tadalafil (CIALIS) 20 MG tablet tadalafil 20 mg tablet  . tamsulosin (FLOMAX) 0.4 MG CAPS capsule Take 1 capsule (0.4 mg total) by mouth daily after supper.  . Testosterone 1.62 % GEL Place 3 Pump onto the skin daily. Place 2 pumps on one shoulder and 1 pump on the other shoulder. Alternate shoulders every other day.  Marland Kitchen VYVANSE 30 MG capsule Take 1 tablet by mouth 2 (two)  times daily.   No facility-administered encounter medications on file as of 03/18/2019.   Physical Exam: Blood pressure 120/80, pulse 76, temperature 97.7 F (36.5 C), temperature source Temporal, height 5' 8.5" (1.74 m), weight 198 lb 6.4 oz (90 kg), SpO2 98 %. Gen:      No acute distress HEENT:  EOMI, sclera anicteric Neck:     No masses; no thyromegaly Lungs:    Clear to auscultation bilaterally; normal respiratory effort CV:         Regular rate and rhythm; no murmurs Abd:      + bowel sounds; soft, non-tender; no palpable masses, no distension Ext:    No edema; adequate peripheral perfusion Skin:      Warm and dry; no rash Neuro: alert and oriented x 3 Psych: normal mood and affect  Data Reviewed: Imaging: Chest x-ray 12/14/2018-bilateral pulmonary infiltrates Chest x-ray 01/25/2019-persistent patchy infiltrates in the left midlung and right upper lobe. HRCT 03/14/2019-mild patchy groundglass opacities with minimal reticulation most prominent in the upper lobes.  Improved since CT of December 2020.  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  Overall improved compared to December He has finished prednisone taper last week Set realistic expectations that this may take a prolonged recovery Offered referral to pulmonary rehab but he prefers to exercise at home.  Advised gradual exercise regimen He has PFTs already scheduled next week  Asthma Continue Dulera, Singulair Follow PFTs when available  Plan/Recommendations: Review PFTs when available Continue inhalers Exercise regimen  Marshell Garfinkel MD [facility] 03/18/2019, 10:44 AM  CC: Dettinger, Fransisca Kaufmann, MD

## 2019-03-28 ENCOUNTER — Telehealth: Payer: Self-pay | Admitting: Internal Medicine

## 2019-03-28 DIAGNOSIS — R0602 Shortness of breath: Secondary | ICD-10-CM | POA: Diagnosis not present

## 2019-03-28 DIAGNOSIS — Z6829 Body mass index (BMI) 29.0-29.9, adult: Secondary | ICD-10-CM | POA: Diagnosis not present

## 2019-03-28 DIAGNOSIS — R05 Cough: Secondary | ICD-10-CM | POA: Diagnosis not present

## 2019-03-28 NOTE — Telephone Encounter (Signed)
[facility] urgent care requesting last OV note to be faxed to [phone].  Requested notes faxed.to requested number. Nothing further at this time.

## 2019-03-31 ENCOUNTER — Ambulatory Visit (AMBULATORY_SURGERY_CENTER): Payer: Self-pay | Admitting: *Deleted

## 2019-03-31 ENCOUNTER — Other Ambulatory Visit: Payer: Self-pay

## 2019-03-31 VITALS — Temp 96.8°F | Ht 68.5 in | Wt 195.0 lb

## 2019-03-31 DIAGNOSIS — Z1211 Encounter for screening for malignant neoplasm of colon: Secondary | ICD-10-CM

## 2019-03-31 MED ORDER — SUPREP BOWEL PREP KIT 17.5-3.13-1.6 GM/177ML PO SOLN: 1.0000 | Freq: Once | ORAL | 0 refills | Status: AC

## 2019-03-31 NOTE — Progress Notes (Signed)
No egg or soy allergy known to patient   issues with past sedation with any surgeries  or procedures PONV , no intubation problems  No diet pills per patient No home 02 use per patient  No blood thinners per patient  Pt denies issues with constipation  No A fib or A flutter  EMMI video sent to pt's e mail   Suprep Code and Coup[on   Due to the COVID-19 pandemic we are asking patients to follow these guidelines. Please only bring one care partner. Please be aware that your care partner may wait in the car in the parking lot or if they feel like they will be too hot to wait in the car, they may wait in the lobby on the 4th floor. All care partners are required to wear a mask the entire time (we do not have any that we can provide them), they need to practice social distancing, and we will do a Covid check for all patient's and care partners when you arrive. Also we will check their temperature and your temperature. If the care partner waits in their car they need to stay in the parking lot the entire time and we will call them on their cell phone when the patient is ready for discharge so they can bring the car to the front of the building. Also all patient's will need to wear a mask into building.  Pt completed COVID vaccines 02-12-2019

## 2019-04-01 ENCOUNTER — Other Ambulatory Visit: Payer: Self-pay | Admitting: "Endocrinology

## 2019-04-11 ENCOUNTER — Ambulatory Visit: Payer: BC Managed Care – PPO | Admitting: "Endocrinology

## 2019-04-13 ENCOUNTER — Encounter: Payer: Self-pay | Admitting: Gastroenterology

## 2019-04-14 ENCOUNTER — Encounter: Payer: Self-pay | Admitting: Gastroenterology

## 2019-04-14 ENCOUNTER — Other Ambulatory Visit: Payer: Self-pay

## 2019-04-14 ENCOUNTER — Ambulatory Visit (AMBULATORY_SURGERY_CENTER): Payer: BC Managed Care – PPO | Admitting: Gastroenterology

## 2019-04-14 VITALS — BP 138/79 | HR 79 | Temp 97.1°F | Resp 17 | Ht 68.5 in | Wt 195.0 lb

## 2019-04-14 DIAGNOSIS — D122 Benign neoplasm of ascending colon: Secondary | ICD-10-CM

## 2019-04-14 DIAGNOSIS — Z1211 Encounter for screening for malignant neoplasm of colon: Secondary | ICD-10-CM | POA: Diagnosis not present

## 2019-04-14 DIAGNOSIS — D123 Benign neoplasm of transverse colon: Secondary | ICD-10-CM | POA: Diagnosis not present

## 2019-04-14 DIAGNOSIS — K635 Polyp of colon: Secondary | ICD-10-CM

## 2019-04-14 DIAGNOSIS — D120 Benign neoplasm of cecum: Secondary | ICD-10-CM

## 2019-04-14 MED ORDER — SODIUM CHLORIDE 0.9 % IV SOLN: 500.0000 mL | Freq: Once | INTRAVENOUS | Status: DC

## 2019-04-14 NOTE — Patient Instructions (Signed)
Handout on polyps given to you today  Await pathology results   YOU HAD AN ENDOSCOPIC PROCEDURE TODAY AT THE [facility]:   Refer to the procedure report that was given to you for any specific questions about what was found during the examination.  If the procedure report does not answer your questions, please call your gastroenterologist to clarify.  If you requested that your care partner not be given the details of your procedure findings, then the procedure report has been included in a sealed envelope for you to review at your convenience later.  YOU SHOULD EXPECT: Some feelings of bloating in the abdomen. Passage of more gas than usual.  Walking can help get rid of the air that was put into your GI tract during the procedure and reduce the bloating. If you had a lower endoscopy (such as a colonoscopy or flexible sigmoidoscopy) you may notice spotting of blood in your stool or on the toilet paper. If you underwent a bowel prep for your procedure, you may not have a normal bowel movement for a few days.  Please Note:  You might notice some irritation and congestion in your nose or some drainage.  This is from the oxygen used during your procedure.  There is no need for concern and it should clear up in a day or so.  SYMPTOMS TO REPORT IMMEDIATELY:   Following lower endoscopy (colonoscopy or flexible sigmoidoscopy):  Excessive amounts of blood in the stool  Significant tenderness or worsening of abdominal pains  Swelling of the abdomen that is new, acute  Fever of 100F or higher  For urgent or emergent issues, a gastroenterologist can be reached at any hour by calling [phone]. Do not use MyChart messaging for urgent concerns.    DIET:  We do recommend a small meal at first, but then you may proceed to your regular diet.  Drink plenty of fluids but you should avoid alcoholic beverages for 24 hours.  ACTIVITY:  You should plan to take it easy for the rest of today and  you should NOT DRIVE or use heavy machinery until tomorrow (because of the sedation medicines used during the test).    FOLLOW UP: Our staff will call the number listed on your records 48-72 hours following your procedure to check on you and address any questions or concerns that you may have regarding the information given to you following your procedure. If we do not reach you, we will leave a message.  We will attempt to reach you two times.  During this call, we will ask if you have developed any symptoms of COVID 19. If you develop any symptoms (ie: fever, flu-like symptoms, shortness of breath, cough etc.) before then, please call [phone].  If you test positive for Covid 19 in the 2 weeks post procedure, please call and report this information to us.    If any biopsies were taken you will be contacted by phone or by letter within the next 1-3 weeks.  Please call us at [phone] if you have not heard about the biopsies in 3 weeks.    SIGNATURES/CONFIDENTIALITY: You and/or your care partner have signed paperwork which will be entered into your electronic medical record.  These signatures attest to the fact that that the information above on your After Visit Summary has been reviewed and is understood.  Full responsibility of the confidentiality of this discharge information lies with you and/or your care-partner. 

## 2019-04-14 NOTE — Op Note (Signed)
[place] Patient Name: Johnny Mathews Procedure Date: 04/14/2019 7:48 AM MRN: [id number] Endoscopist: Mallie Mussel L. Loletha Carrow , MD Age: 59 Referring MD:  Date of Birth: [birth year] Gender: Male Account #: [id number] Procedure:                Colonoscopy Indications:              Screening for colorectal malignant neoplasm (no                            polyps 09/2008) Medicines:                Monitored Anesthesia Care Procedure:                Pre-Anesthesia Assessment:                           - Prior to the procedure, a History and Physical                            was performed, and patient medications and                            allergies were reviewed. The patient's tolerance of                            previous anesthesia was also reviewed. The risks                            and benefits of the procedure and the sedation                            options and risks were discussed with the patient.                            All questions were answered, and informed consent                            was obtained. Prior Anticoagulants: The patient has                            taken no previous anticoagulant or antiplatelet                            agents. ASA Grade Assessment: III - A patient with                            severe systemic disease. After reviewing the risks                            and benefits, the patient was deemed in                            satisfactory condition to undergo the procedure.  After obtaining informed consent, the colonoscope                            was passed under direct vision. Throughout the                            procedure, the patient's blood pressure, pulse, and                            oxygen saturations were monitored continuously. The                            Colonoscope was introduced through the anus and                            advanced to the the cecum, identified by                     appendiceal orifice and ileocecal valve. The                            colonoscopy was performed without difficulty. The                            patient tolerated the procedure well. The quality                            of the bowel preparation was good. The ileocecal                            valve, appendiceal orifice, and rectum were                            photographed. The quality of the bowel preparation                            was evaluated using the BBPS [facility] Bowel                            Preparation Scale) with scores of: Right Colon = 2,                            Transverse Colon = 2 and Left Colon = 3. The total                            BBPS score equals 7. The bowel preparation used was                            SUPREP (prep in 2010 reportedly fair with miralax). Scope In: 7:55:45 AM Scope Out: 8:36:30 AM Scope Withdrawal Time: 0 hours 35 minutes 32 seconds  Total Procedure Duration: 0 hours 40 minutes 45 seconds  Findings:                 The perianal and digital rectal examinations  were                            normal.                           A 12 mm polyp was found in the cecum. The polyp was                            sessile. The polyp was removed with a hot and cold                            snare. Resection and retrieval were complete.                           Three sessile polyps were found in the cecum. The                            polyps were diminutive in size. These polyps were                            removed with a cold snare. Resection and retrieval                            were complete.                           A 15 mm polyp was found in the proximal ascending                            colon. The polyp was sessile. The polyp was removed                            with a hot snare. Resection and retrieval were                            complete.                           A 10 mm polyp was found in the  distal transverse                            colon. The polyp was semi-sessile. The polyp was                            removed with a cold snare. Resection and retrieval                            were complete.                           The exam was otherwise without abnormality on  direct and retroflexion views. Complications:            No immediate complications. Estimated Blood Loss:     Estimated blood loss was minimal. Impression:               - One 12 mm polyp in the cecum, removed with a hot                            snare. Resected and retrieved.                           - Three diminutive polyps in the cecum, removed                            with a cold snare. Resected and retrieved.                           - One 15 mm polyp in the proximal ascending colon,                            removed with a hot snare. Resected and retrieved.                           - One 10 mm polyp in the distal transverse colon,                            removed with a cold snare. Resected and retrieved.                           - The examination was otherwise normal on direct                            and retroflexion views. Recommendation:           - Patient has a contact number available for                            emergencies. The signs and symptoms of potential                            delayed complications were discussed with the                            patient. Return to normal activities tomorrow.                            Written discharge instructions were provided to the                            patient.                           - Resume previous diet.                           -  Continue present medications.                           - Await pathology results.                           - Repeat colonoscopy is recommended for                            surveillance. The colonoscopy date will be                            determined after  pathology results from today's                            exam become available for review. Chloe Bluett L. Loletha Carrow, MD 04/14/2019 8:43:03 AM This report has been signed electronically.

## 2019-04-14 NOTE — Progress Notes (Signed)
approx 0825, while I was giving zofran and robinul, pt noted to be wretching, suction applied.  Quick burst of bile color fluid noted. Head dropped to steep t-burg.  Sedation halted and pt allowed to wake up and follow commands to swallow several times.  Less than 100cc in cannister  Since still in ascending and no adverse events immediately noted, light sedation resumed  Dr Loletha Carrow aware

## 2019-04-14 NOTE — Progress Notes (Signed)
Called to room to assist during endoscopic procedure.  Patient ID and intended procedure confirmed with present staff. Received instructions for my participation in the procedure from the performing physician.  

## 2019-04-14 NOTE — Progress Notes (Signed)
Pt's states no medical or surgical changes since previsit or office visit.  Johnny Mathews

## 2019-04-14 NOTE — Progress Notes (Signed)
Report to PACU, RN, vss, BBS= Clear.  

## 2019-04-18 ENCOUNTER — Telehealth: Payer: Self-pay

## 2019-04-18 ENCOUNTER — Encounter: Payer: Self-pay | Admitting: Gastroenterology

## 2019-04-18 DIAGNOSIS — I5032 Chronic diastolic (congestive) heart failure: Secondary | ICD-10-CM | POA: Diagnosis not present

## 2019-04-18 DIAGNOSIS — I25118 Atherosclerotic heart disease of native coronary artery with other forms of angina pectoris: Secondary | ICD-10-CM | POA: Insufficient documentation

## 2019-04-18 DIAGNOSIS — I10 Essential (primary) hypertension: Secondary | ICD-10-CM | POA: Diagnosis not present

## 2019-04-18 DIAGNOSIS — Z8616 Personal history of COVID-19: Secondary | ICD-10-CM | POA: Diagnosis not present

## 2019-04-18 NOTE — Telephone Encounter (Signed)
  Follow up Call-  Call back number 04/14/2019  Post procedure Call Back phone  # [phone]  Permission to leave phone message Yes  Some recent data might be hidden     Patient questions:  Do you have a fever, pain , or abdominal swelling? No. Pain Score  0 *  Have you tolerated food without any problems? Yes.    Have you been able to return to your normal activities? Yes.    Do you have any questions about your discharge instructions: Diet   No. Medications  No. Follow up visit  No.  Do you have questions or concerns about your Care? No.  Actions: * If pain score is 4 or above: No action needed, pain <4.  1. Have you developed a fever since your procedure? no  2.   Have you had an respiratory symptoms (SOB or cough) since your procedure? no  3.   Have you tested positive for COVID 19 since your procedure no  4.   Have you had any family members/close contacts diagnosed with the COVID 19 since your procedure?  no   If yes to any of these questions please route to Joylene John, RN and Erenest Rasher, RN

## 2019-04-28 ENCOUNTER — Encounter: Payer: Self-pay | Admitting: Family Medicine

## 2019-04-28 DIAGNOSIS — I25118 Atherosclerotic heart disease of native coronary artery with other forms of angina pectoris: Secondary | ICD-10-CM | POA: Diagnosis not present

## 2019-04-28 DIAGNOSIS — I10 Essential (primary) hypertension: Secondary | ICD-10-CM | POA: Diagnosis not present

## 2019-05-02 DIAGNOSIS — Z6829 Body mass index (BMI) 29.0-29.9, adult: Secondary | ICD-10-CM | POA: Diagnosis not present

## 2019-05-02 DIAGNOSIS — R0789 Other chest pain: Secondary | ICD-10-CM | POA: Diagnosis not present

## 2019-05-02 DIAGNOSIS — R0781 Pleurodynia: Secondary | ICD-10-CM | POA: Diagnosis not present

## 2019-05-02 DIAGNOSIS — S299XXA Unspecified injury of thorax, initial encounter: Secondary | ICD-10-CM | POA: Diagnosis not present

## 2019-05-04 ENCOUNTER — Encounter: Payer: Self-pay | Admitting: Family Medicine

## 2019-05-04 NOTE — Telephone Encounter (Signed)
The first available is with MMM tomorrow is that okay?

## 2019-05-04 NOTE — Telephone Encounter (Signed)
Please advise 

## 2019-05-05 ENCOUNTER — Other Ambulatory Visit: Payer: Self-pay

## 2019-05-05 ENCOUNTER — Ambulatory Visit (INDEPENDENT_AMBULATORY_CARE_PROVIDER_SITE_OTHER): Payer: BC Managed Care – PPO

## 2019-05-05 ENCOUNTER — Ambulatory Visit: Payer: BC Managed Care – PPO | Admitting: Nurse Practitioner

## 2019-05-05 ENCOUNTER — Encounter: Payer: Self-pay | Admitting: Nurse Practitioner

## 2019-05-05 VITALS — BP 133/80 | HR 86 | Temp 97.2°F | Ht 68.5 in | Wt 199.4 lb

## 2019-05-05 DIAGNOSIS — R0781 Pleurodynia: Secondary | ICD-10-CM | POA: Insufficient documentation

## 2019-05-05 DIAGNOSIS — S2231XA Fracture of one rib, right side, initial encounter for closed fracture: Secondary | ICD-10-CM | POA: Diagnosis not present

## 2019-05-05 MED ORDER — HYDROCODONE-ACETAMINOPHEN 5-325 MG PO TABS: 1.0000 | ORAL_TABLET | Freq: Three times a day (TID) | ORAL | 0 refills | Status: DC | PRN

## 2019-05-05 NOTE — Patient Instructions (Signed)
Rib Contusion A rib contusion is a deep bruise on your rib area. Contusions are the result of a blunt trauma that causes bleeding and injury to the tissues under the skin. A rib contusion may involve bruising of the ribs and of the skin and muscles in the area. The skin over the contusion may turn blue, purple, or yellow. Minor injuries will give you a painless contusion. More severe contusions may stay painful and swollen for a few weeks. What are the causes? This condition is usually caused by a blow, trauma, or direct force to an area of the body. This often occurs while playing contact sports. What are the signs or symptoms? Symptoms of this condition include:  Swelling and redness of the injured area.  Discoloration of the injured area.  Tenderness and soreness of the injured area.  Pain with or without movement. How is this diagnosed? This condition may be diagnosed based on:  Your symptoms and medical history.  A physical exam.  Imaging tests--such as an X-ray, CT scan, or MRI--to determine if there were internal injuries or broken bones (fractures). How is this treated? This condition may be treated with:  Rest. This is often the best treatment for a rib contusion.  Icing. This reduces swelling and inflammation.  Deep-breathing exercises. These may be recommended to reduce the risk for lung collapse and pneumonia.  Medicines. Over-the-counter or prescription medicines may be given to control pain.  Injection of a numbing medicine around the nerve near your injury (nerve block). Follow these instructions at home:     Medicines  Take over-the-counter and prescription medicines only as told by your health care provider.  Do not drive or use heavy machinery while taking prescription pain medicine.  If you are taking prescription pain medicine, take actions to prevent or treat constipation. Your health care provider may recommend that you: ? Drink enough fluid to keep  your urine pale yellow. ? Eat foods that are high in fiber, such as fresh fruits and vegetables, whole grains, and beans. ? Limit foods that are high in fat and processed sugars, such as fried or sweet foods. ? Take an over-the-counter or prescription medicine for constipation. Managing pain, stiffness, and swelling  If directed, put ice on the injured area: ? Put ice in a plastic bag. ? Place a towel between your skin and the bag. ? Leave the ice on for 20 minutes, 2-3 times a day.  Rest the injured area. Avoid strenuous activity and any activities or movements that cause pain. Be careful during activities and avoid bumping the injured area.  Do not lift anything that is heavier than 5 lb (2.3 kg), or the limit that you are told, until your health care provider says that it is safe. General instructions  Do not use any products that contain nicotine or tobacco, such as cigarettes and e-cigarettes. These can delay healing. If you need help quitting, ask your health care provider.  Do deep-breathing exercises as told by your health care provider.  If you were given an incentive spirometer, use it every 1-2 hours while you are awake, or as recommended by your health care provider. This device measures how well you are filling your lungs with each breath.  Keep all follow-up visits as told by your health care provider. This is important. Contact a health care provider if you have:  Increased bruising or swelling.  Pain that is not controlled with treatment.  A fever. Get help right away if   you:  Have difficulty breathing or shortness of breath.  Develop a continual cough or you cough up thick or bloody sputum.  Feel nauseous or you vomit.  Have pain in your abdomen. Summary  A rib contusion is a deep bruise on your rib area. Contusions are the result of a blunt trauma that causes bleeding and injury to the tissues under the skin.  The skin overlying the contusion may turn  blue, purple, or yellow. Minor injuries may give you a painless contusion. More severe contusions may stay painful and swollen for a few weeks.  Rest the injured area. Avoid strenuous activity and any activities or movements that cause pain. This information is not intended to replace advice given to you by your health care provider. Make sure you discuss any questions you have with your health care provider. Document Revised: 01/21/2017 Document Reviewed: 01/21/2017 Elsevier Patient Education  2020 [employer]  

## 2019-05-05 NOTE — Progress Notes (Signed)
Subjective:    Patient ID: Johnny Mathews, male    DOB: [birth year], 59 y.o.   MRN: [id number]   Chief Complaint: Fall (pt here today c/o right side rib pain after falling-was seen at [facility] already but the prednisone and flexeril isn't helping)   HPI Pt presents today for rib pain on the right side. He had a fall onto his right side 8 days ago and went to urgent care on 05/02/19 where he received toradol IM, PO prednisone, and PO flexeril. There has been no improvement in symptoms. Reports that he has to sleep sitting up because of the pain and it feels like he is being stabbed when he coughs. If he sits and rests, it does help the pain a little bit. He has used ice some and reports it did help the pain. Did not see any bruising. Reports there may have been a small amount of swelling but does not really notice it now. Has pain when taking a deep breath.     Review of Systems  Constitutional: Positive for fatigue.  HENT: Negative.   Eyes: Positive for visual disturbance (significantly decreased vision in right eye).  Respiratory: Positive for cough and shortness of breath (occasionally).   Cardiovascular: Negative.   Gastrointestinal: Negative.   Endocrine: Negative.   Genitourinary: Negative.   Musculoskeletal:       Right rib pain  Skin: Negative.   Neurological: Negative.   Hematological: Negative.   Psychiatric/Behavioral: Negative.        Objective:   Physical Exam Vitals and nursing note reviewed.  Constitutional:      Appearance: Normal appearance.  HENT:     Head: Normocephalic.     Nose: Nose normal.     Mouth/Throat:     Mouth: Mucous membranes are moist.     Pharynx: Oropharynx is clear.  Eyes:     Conjunctiva/sclera: Conjunctivae normal.     Pupils: Pupils are equal, round, and reactive to light.  Cardiovascular:     Rate and Rhythm: Normal rate. Rhythm irregular.     Pulses: Normal pulses.     Heart sounds: Normal heart sounds.  Pulmonary:     Effort:  Pulmonary effort is normal.     Breath sounds: Normal breath sounds.  Chest:     Chest wall: Tenderness (right side) present.  Abdominal:     General: Bowel sounds are normal.     Palpations: Abdomen is soft.  Skin:    General: Skin is warm and dry.     Capillary Refill: Capillary refill takes less than 2 seconds.  Neurological:     Mental Status: He is alert and oriented to person, place, and time.  Psychiatric:        Mood and Affect: Mood normal.        Behavior: Behavior normal.     BP 133/80   Pulse 86   Temp (!) 97.2 F (36.2 C) (Temporal)   Ht 5' 8.5" (1.74 m)   Wt 199 lb 6 oz (90.4 kg)   BMI 29.87 kg/m      Assessment & Plan:  Johnny Mathews comes in today with chief complaint of Fall (pt here today c/o right side rib pain after falling-was seen at [facility] already but the prednisone and flexeril isn't helping)   Diagnosis and orders addressed:  1. Rib pain on right side Splint with coughing Ice to area prn Cough and deep breathe every 2 hours to prevent pneumonia - DG  Ribs Unilateral W/Chest Right; Future - HYDROcodone-acetaminophen (NORCO) 5-325 MG tablet; Take 1 tablet by mouth every 8 (eight) hours as needed for moderate pain.  Dispense: 15 tablet; Refill: 0   Follow up plan: prn   Mary-Margaret Hassell Done, FNP

## 2019-07-19 DIAGNOSIS — I25118 Atherosclerotic heart disease of native coronary artery with other forms of angina pectoris: Secondary | ICD-10-CM | POA: Diagnosis not present

## 2019-07-19 DIAGNOSIS — I5032 Chronic diastolic (congestive) heart failure: Secondary | ICD-10-CM | POA: Diagnosis not present

## 2019-07-19 DIAGNOSIS — I350 Nonrheumatic aortic (valve) stenosis: Secondary | ICD-10-CM | POA: Diagnosis not present

## 2019-07-19 DIAGNOSIS — I10 Essential (primary) hypertension: Secondary | ICD-10-CM | POA: Diagnosis not present

## 2019-07-29 ENCOUNTER — Other Ambulatory Visit: Payer: Self-pay | Admitting: Family Medicine

## 2019-08-12 ENCOUNTER — Other Ambulatory Visit: Payer: Self-pay | Admitting: Family Medicine

## 2019-08-31 ENCOUNTER — Encounter: Payer: Self-pay | Admitting: Family Medicine

## 2019-08-31 ENCOUNTER — Other Ambulatory Visit: Payer: Self-pay | Admitting: Family Medicine

## 2019-09-05 DIAGNOSIS — E291 Testicular hypofunction: Secondary | ICD-10-CM | POA: Diagnosis not present

## 2019-09-07 ENCOUNTER — Ambulatory Visit: Payer: BC Managed Care – PPO

## 2019-09-07 ENCOUNTER — Other Ambulatory Visit: Payer: Self-pay

## 2019-09-07 DIAGNOSIS — Z23 Encounter for immunization: Secondary | ICD-10-CM

## 2019-09-07 NOTE — Progress Notes (Signed)
   Covid-19 Vaccination Clinic  Name:  Johnny Mathews    MRN: [id number] DOB: 1960/09/11  09/07/2019  [name] was observed post Covid-19 immunization for 15 minutes without incident. He was provided with Vaccine Information Sheet and instruction to access the V-Safe system.   [name] was instructed to call 911 with any severe reactions post vaccine: Marland Kitchen Difficulty breathing  . Swelling of face and throat  . A fast heartbeat  . A bad rash all over body  . Dizziness and weakness

## 2019-09-13 ENCOUNTER — Other Ambulatory Visit: Payer: Self-pay | Admitting: Family Medicine

## 2019-09-15 DIAGNOSIS — R6882 Decreased libido: Secondary | ICD-10-CM | POA: Diagnosis not present

## 2019-09-15 DIAGNOSIS — E291 Testicular hypofunction: Secondary | ICD-10-CM | POA: Diagnosis not present

## 2019-09-15 DIAGNOSIS — E663 Overweight: Secondary | ICD-10-CM | POA: Diagnosis not present

## 2019-09-15 DIAGNOSIS — E119 Type 2 diabetes mellitus without complications: Secondary | ICD-10-CM | POA: Diagnosis not present

## 2019-09-30 ENCOUNTER — Other Ambulatory Visit: Payer: Self-pay | Admitting: Family Medicine

## 2019-10-17 DIAGNOSIS — E291 Testicular hypofunction: Secondary | ICD-10-CM | POA: Diagnosis not present

## 2019-10-19 DIAGNOSIS — N400 Enlarged prostate without lower urinary tract symptoms: Secondary | ICD-10-CM | POA: Diagnosis not present

## 2019-10-19 DIAGNOSIS — E663 Overweight: Secondary | ICD-10-CM | POA: Diagnosis not present

## 2019-10-19 DIAGNOSIS — G7100 Muscular dystrophy, unspecified: Secondary | ICD-10-CM | POA: Diagnosis not present

## 2019-10-19 DIAGNOSIS — E291 Testicular hypofunction: Secondary | ICD-10-CM | POA: Diagnosis not present

## 2019-10-22 ENCOUNTER — Other Ambulatory Visit: Payer: Self-pay | Admitting: Family Medicine

## 2019-10-24 ENCOUNTER — Encounter: Payer: Self-pay | Admitting: Family Medicine

## 2019-10-24 NOTE — Telephone Encounter (Signed)
Dettinger. NTBS 30 days given 09/14/19

## 2019-10-25 NOTE — Telephone Encounter (Signed)
Left message for patient to call and schedule an appointment for medication refill.

## 2019-11-06 ENCOUNTER — Other Ambulatory Visit: Payer: Self-pay | Admitting: Family Medicine

## 2019-11-06 DIAGNOSIS — E785 Hyperlipidemia, unspecified: Secondary | ICD-10-CM

## 2019-11-07 DIAGNOSIS — H2511 Age-related nuclear cataract, right eye: Secondary | ICD-10-CM | POA: Diagnosis not present

## 2019-11-07 DIAGNOSIS — E113293 Type 2 diabetes mellitus with mild nonproliferative diabetic retinopathy without macular edema, bilateral: Secondary | ICD-10-CM | POA: Diagnosis not present

## 2019-11-07 DIAGNOSIS — H43813 Vitreous degeneration, bilateral: Secondary | ICD-10-CM | POA: Diagnosis not present

## 2019-11-09 ENCOUNTER — Ambulatory Visit: Payer: BC Managed Care – PPO | Admitting: Family Medicine

## 2019-11-09 ENCOUNTER — Encounter: Payer: Self-pay | Admitting: Family Medicine

## 2019-11-09 ENCOUNTER — Other Ambulatory Visit: Payer: Self-pay

## 2019-11-09 VITALS — BP 149/85 | HR 78 | Temp 97.7°F | Ht 68.5 in | Wt 198.0 lb

## 2019-11-09 DIAGNOSIS — N400 Enlarged prostate without lower urinary tract symptoms: Secondary | ICD-10-CM | POA: Diagnosis not present

## 2019-11-09 DIAGNOSIS — E119 Type 2 diabetes mellitus without complications: Secondary | ICD-10-CM

## 2019-11-09 DIAGNOSIS — Z23 Encounter for immunization: Secondary | ICD-10-CM | POA: Diagnosis not present

## 2019-11-09 DIAGNOSIS — E785 Hyperlipidemia, unspecified: Secondary | ICD-10-CM | POA: Diagnosis not present

## 2019-11-09 DIAGNOSIS — K219 Gastro-esophageal reflux disease without esophagitis: Secondary | ICD-10-CM | POA: Diagnosis not present

## 2019-11-09 DIAGNOSIS — I10 Essential (primary) hypertension: Secondary | ICD-10-CM

## 2019-11-09 DIAGNOSIS — H259 Unspecified age-related cataract: Secondary | ICD-10-CM

## 2019-11-09 LAB — BAYER DCA HB A1C WAIVED: HB A1C (BAYER DCA - WAIVED): 5 % (ref <7.0)

## 2019-11-09 MED ORDER — MELOXICAM 7.5 MG PO TABS
ORAL_TABLET | ORAL | 3 refills | Status: DC
Start: 2019-11-09 — End: 2020-08-29

## 2019-11-09 MED ORDER — FUROSEMIDE 20 MG PO TABS
20.0000 mg | ORAL_TABLET | Freq: Every day | ORAL | 3 refills | Status: DC | PRN
Start: 2019-11-09 — End: 2021-01-17

## 2019-11-09 MED ORDER — FAMOTIDINE 20 MG PO TABS: 20.0000 mg | ORAL_TABLET | Freq: Two times a day (BID) | ORAL | 3 refills | Status: DC

## 2019-11-09 MED ORDER — SILDENAFIL CITRATE 20 MG PO TABS
ORAL_TABLET | ORAL | 3 refills | Status: DC
Start: 2019-11-09 — End: 2020-02-10

## 2019-11-09 MED ORDER — LOSARTAN POTASSIUM 50 MG PO TABS
50.0000 mg | ORAL_TABLET | Freq: Every day | ORAL | 3 refills | Status: DC
Start: 2019-11-09 — End: 2020-08-29

## 2019-11-09 MED ORDER — TAMSULOSIN HCL 0.4 MG PO CAPS: ORAL_CAPSULE | ORAL | 3 refills | Status: DC

## 2019-11-09 MED ORDER — AMITRIPTYLINE HCL 25 MG PO TABS: 75.0000 mg | ORAL_TABLET | Freq: Every day | ORAL | 3 refills | Status: DC

## 2019-11-09 MED ORDER — TRULICITY 0.75 MG/0.5ML ~~LOC~~ SOAJ: 0.7500 mg | SUBCUTANEOUS | 3 refills | Status: DC

## 2019-11-09 MED ORDER — FINASTERIDE 5 MG PO TABS: ORAL_TABLET | ORAL | 3 refills | Status: DC

## 2019-11-09 MED ORDER — PRAVASTATIN SODIUM 80 MG PO TABS: ORAL_TABLET | ORAL | 3 refills | Status: DC

## 2019-11-09 MED ORDER — FLUTICASONE PROPIONATE 50 MCG/ACT NA SUSP: 1.0000 | Freq: Every day | NASAL | 11 refills | Status: DC

## 2019-11-09 NOTE — Addendum Note (Signed)
Addended by: Alphonzo Dublin on: 11/09/2019 10:06 AM   Modules accepted: Orders

## 2019-11-09 NOTE — Progress Notes (Signed)
BP (!) 149/85   Pulse 78   Temp 97.7 F (36.5 C)   Ht 5' 8.5" (1.74 m)   Wt 198 lb (89.8 kg)   SpO2 98%   BMI 29.67 kg/m    Subjective:   Patient ID: Johnny Mathews, male    DOB: Dec 28, 1960, 59 y.o.   MRN: [id number]  HPI: Johnny Mathews is a 59 y.o. male presenting on 11/09/2019 for Medical Management of Chronic Issues, Diabetes, and Hypertension   HPI Hypertension Patient is currently on furosemide and losartan, and their blood pressure today is 149/85. Patient denies any lightheadedness or dizziness. Patient denies headaches, blurred vision, chest pains, shortness of breath, or weakness. Denies any side effects from medication and is content with current medication.   Type 2 diabetes mellitus Patient comes in today for recheck of his diabetes. Patient has been currently taking Trulicity. Patient is currently on an ACE inhibitor/ARB. Patient has seen an ophthalmologist this year. Patient denies any issues with their feet. The symptom started onset as an adult hypertension and hyperlipidemia and cataracts ARE RELATED TO DM   Hyperlipidemia Patient is coming in for recheck of his hyperlipidemia. The patient is currently taking pravastatin. They deny any issues with myalgias or history of liver damage from it. They deny any focal numbness or weakness or chest pain.   GERD Patient is currently on Nexium and Pepcid.  She denies any major symptoms or abdominal pain or belching or burping. She denies any blood in her stool or lightheadedness or dizziness.   BPH Patient is coming in for recheck on BPH Symptoms: None currently Medication: Finasteride and tamsulosin Last PSA: 1 year ago  Patient is recovering from Covid and has some congestion from this and the occasional wheezing.  He does have to use his nebulizer occasionally and keeps on hand.  Patient has been having issues with cataracts on the right side where he sees haziness and fuzziness and he wants to go to see a Publishing rights manager.  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  He cannot recall the doctor's name.  Relevant past medical, surgical, family and social history reviewed and updated as indicated. Interim medical history since our last visit reviewed. Allergies and medications reviewed and updated.  Review of Systems  Constitutional: Negative for chills and fever.  Eyes: Negative for visual disturbance.  Respiratory: Negative for shortness of breath and wheezing.   Cardiovascular: Negative for chest pain and leg swelling.  Musculoskeletal: Negative for back pain and gait problem.  Skin: Negative for rash.  Neurological: Negative for dizziness, weakness and numbness.  All other systems reviewed and are negative.   Per HPI unless specifically indicated above   Allergies as of 11/09/2019      Reactions   Eggs Or Egg-derived Products Swelling   Throat- 03-31-2019 pt states can eat cakes and Pie with no issues  Throat- 03-31-2019 pt states can eat cakes and Pie with no issues    Galactose    Alpha- Gal    Septra [sulfamethoxazole-trimethoprim] Swelling   throat   Sulfonamide Derivatives    REACTION: hives      Medication List       Accurate as of November 09, 2019  9:34 AM. If you have any questions, ask your nurse or doctor.        STOP taking these medications   predniSONE 5 MG  tablet Commonly known as: DELTASONE Stopped by: Fransisca Kaufmann , MD     TAKE these medications   albuterol 108 (90 Base) MCG/ACT inhaler Commonly known as: Ventolin HFA Inhale 2 puffs into the lungs every 4 (four) hours as needed. (Needs to be seen before next refill)   albuterol (2.5 MG/3ML) 0.083% nebulizer solution Commonly known as: PROVENTIL NEBULIZE 1 VIAL EVERY 6 HOURS AS NEEDED FOR WHEEZING & SHORTNESS OF BREATH   amitriptyline 25 MG tablet Commonly known as: ELAVIL Take 3 tablets (75 mg total) by mouth at  bedtime.   azelastine 0.1 % nasal spray Commonly known as: ASTELIN Use in each nostril as directed   Contour Blood Glucose System w/Device Kit Test blood sugars four times daily   Dulera 100-5 MCG/ACT Aero Generic drug: mometasone-formoterol USE 2 PUFFS TWICE DAILY   EPINEPHrine 0.3 mg/0.3 mL Devi Commonly known as: EPI-PEN Inject 0.3 mg into the skin as needed.   esomeprazole 40 MG capsule Commonly known as: NEXIUM TAKE 1 CAPSULE DAILY AT NOON   famotidine 20 MG tablet Commonly known as: PEPCID Take 1 tablet (20 mg total) by mouth 2 (two) times daily.   ferrous sulfate 325 (65 FE) MG tablet Take 325 mg by mouth daily with breakfast.   finasteride 5 MG tablet Commonly known as: PROSCAR TAKE ONE (1) TABLET EACH DAY   fluticasone 50 MCG/ACT nasal spray Commonly known as: FLONASE Place 1-2 sprays into both nostrils daily.   furosemide 20 MG tablet Commonly known as: LASIX TAKE ONE TABLET BY MOUTH DAILY AS NEEDED   glucose blood test strip Commonly known as: Contour Next Test Test blood sugars four times daily   HYDROcodone-acetaminophen 5-325 MG tablet Commonly known as: Norco Take 1 tablet by mouth every 8 (eight) hours as needed for moderate pain.   losartan 50 MG tablet Commonly known as: COZAAR Take 1 tablet (50 mg total) by mouth daily.   Meclizine HCl 25 MG Chew Chew 1 tablet (25 mg total) by mouth at bedtime.   meloxicam 7.5 MG tablet Commonly known as: MOBIC TAKE ONE (1) TABLET EACH DAY   montelukast 10 MG tablet Commonly known as: SINGULAIR TAKE ONE TABLET DAILY AT BEDTIME   pravastatin 80 MG tablet Commonly known as: PRAVACHOL TAKE ONE (1) TABLET EACH DAY   sildenafil 20 MG tablet Commonly known as: REVATIO TAKE 1-3 TABLETS BY MOUTH AS NEEDED   tamsulosin 0.4 MG Caps capsule Commonly known as: FLOMAX TAKE 1 CAPSULE DAILY AFTER SUPPER   Testosterone 1.62 % Gel PLACE 2 PUMPS ON 1 SHOULDER & PLACE 1 PUMP ON THE OTHER SHOULDER  DAILY(ALTERNATE SHOULDERS EVERY OTHER DAY)   Trulicity 4.23 NT/6.1WE Sopn Generic drug: Dulaglutide Inject 0.5 mLs (0.75 mg total) into the skin once a week. (Needs to be seen before next refill)        Objective:   BP (!) 149/85   Pulse 78   Temp 97.7 F (36.5 C)   Ht 5' 8.5" (1.74 m)   Wt 198 lb (89.8 kg)   SpO2 98%   BMI 29.67 kg/m   Wt Readings from Last 3 Encounters:  11/09/19 198 lb (89.8 kg)  05/05/19 199 lb 6 oz (90.4 kg)  04/14/19 195 lb (88.5 kg)    Physical Exam Vitals and nursing note reviewed.  Constitutional:      General: He is not in acute distress.    Appearance: He is well-developed. He is not diaphoretic.  Eyes:     General: No  scleral icterus.    Conjunctiva/sclera: Conjunctivae normal.  Neck:     Thyroid: No thyromegaly.  Cardiovascular:     Rate and Rhythm: Normal rate and regular rhythm.     Heart sounds: Normal heart sounds. No murmur heard.   Pulmonary:     Effort: Pulmonary effort is normal. No respiratory distress.     Breath sounds: Normal breath sounds. No wheezing.  Musculoskeletal:        General: Normal range of motion.     Cervical back: Neck supple.  Lymphadenopathy:     Cervical: No cervical adenopathy.  Skin:    General: Skin is warm and dry.     Findings: No rash.  Neurological:     Mental Status: He is alert and oriented to person, place, and time.     Coordination: Coordination normal.  Psychiatric:        Behavior: Behavior normal.       Assessment & Plan:   Problem List Items Addressed This Visit      Cardiovascular and Mediastinum   Essential hypertension   Relevant Medications   losartan (COZAAR) 50 MG tablet   pravastatin (PRAVACHOL) 80 MG tablet   sildenafil (REVATIO) 20 MG tablet   furosemide (LASIX) 20 MG tablet     Digestive   GERD (gastroesophageal reflux disease)   Relevant Medications   famotidine (PEPCID) 20 MG tablet   Other Relevant Orders   CBC with Differential/Platelet      Endocrine   Type 2 diabetes mellitus without complication, without long-term current use of insulin (HCC) - Primary   Relevant Medications   Dulaglutide (TRULICITY) 7.93 JQ/3.0SP SOPN   losartan (COZAAR) 50 MG tablet   pravastatin (PRAVACHOL) 80 MG tablet   Other Relevant Orders   Bayer DCA Hb A1c Waived   CBC with Differential/Platelet   CMP14+EGFR     Genitourinary   BPH (benign prostatic hyperplasia)   Relevant Medications   finasteride (PROSCAR) 5 MG tablet   tamsulosin (FLOMAX) 0.4 MG CAPS capsule   Other Relevant Orders   PSA, total and free     Other   Hyperlipidemia with target LDL less than 100   Relevant Medications   losartan (COZAAR) 50 MG tablet   pravastatin (PRAVACHOL) 80 MG tablet   sildenafil (REVATIO) 20 MG tablet   furosemide (LASIX) 20 MG tablet    Other Visit Diagnoses    Hyperlipidemia, unspecified hyperlipidemia type       Relevant Medications   losartan (COZAAR) 50 MG tablet   pravastatin (PRAVACHOL) 80 MG tablet   sildenafil (REVATIO) 20 MG tablet   furosemide (LASIX) 20 MG tablet   Other Relevant Orders   Lipid panel   Senile cataract of right eye, unspecified age-related cataract type       Relevant Orders   Ambulatory referral to Ophthalmology      Will refill patient's medications and keep the same, will refer to ophthalmology. Follow up plan: Return in about 6 months (around 05/08/2020), or if symptoms worsen or fail to improve, for Diabetes and hypertension and prostate.  Counseling provided for all of the vaccine components Orders Placed This Encounter  Procedures  . [facility] Hb A1c Waived    Caryl Pina, MD [facility] 11/09/2019, 9:34 AM

## 2019-11-10 LAB — PSA, TOTAL AND FREE
PSA, Free Pct: 18.3 %
PSA, Free: 0.11 ng/mL
Prostate Specific Ag, Serum: 0.6 ng/mL (ref 0.0–4.0)

## 2019-11-10 LAB — CMP14+EGFR
ALT: 30 IU/L (ref 0–44)
AST: 35 IU/L (ref 0–40)
Albumin/Globulin Ratio: 1.8 (ref 1.2–2.2)
Albumin: 4.7 g/dL (ref 3.8–4.8)
Alkaline Phosphatase: 115 IU/L (ref 44–121)
BUN/Creatinine Ratio: 18 (ref 10–24)
BUN: 21 mg/dL (ref 8–27)
Bilirubin Total: 0.3 mg/dL (ref 0.0–1.2)
CO2: 24 mmol/L (ref 20–29)
Calcium: 9.5 mg/dL (ref 8.6–10.2)
Chloride: 102 mmol/L (ref 96–106)
Creatinine, Ser: 1.18 mg/dL (ref 0.76–1.27)
GFR calc Af Amer: 77 mL/min/{1.73_m2} (ref >59)
GFR calc non Af Amer: 66 mL/min/{1.73_m2} (ref >59)
Globulin, Total: 2.6 g/dL (ref 1.5–4.5)
Glucose: 92 mg/dL (ref 65–99)
Potassium: 5.4 mmol/L — ABNORMAL HIGH (ref 3.5–5.2)
Sodium: 139 mmol/L (ref 134–144)
Total Protein: 7.3 g/dL (ref 6.0–8.5)

## 2019-11-10 LAB — CBC WITH DIFFERENTIAL/PLATELET
Basophils Absolute: 0 10*3/uL (ref 0.0–0.2)
Basos: 1 %
EOS (ABSOLUTE): 0.3 10*3/uL (ref 0.0–0.4)
Eos: 5 %
Hematocrit: 40.7 % (ref 37.5–51.0)
Hemoglobin: 13.6 g/dL (ref 13.0–17.7)
Immature Grans (Abs): 0 10*3/uL (ref 0.0–0.1)
Immature Granulocytes: 1 %
Lymphocytes Absolute: 1.6 10*3/uL (ref 0.7–3.1)
Lymphs: 25 %
MCH: 31.1 pg (ref 26.6–33.0)
MCHC: 33.4 g/dL (ref 31.5–35.7)
MCV: 93 fL (ref 79–97)
Monocytes Absolute: 0.7 10*3/uL (ref 0.1–0.9)
Monocytes: 11 %
Neutrophils Absolute: 3.7 10*3/uL (ref 1.4–7.0)
Neutrophils: 57 %
Platelets: 242 10*3/uL (ref 150–450)
RBC: 4.37 x10E6/uL (ref 4.14–5.80)
RDW: 12.3 % (ref 11.6–15.4)
WBC: 6.3 10*3/uL (ref 3.4–10.8)

## 2019-11-10 LAB — LIPID PANEL
Chol/HDL Ratio: 2.2 ratio (ref 0.0–5.0)
Cholesterol, Total: 186 mg/dL (ref 100–199)
HDL: 85 mg/dL (ref >39)
LDL Chol Calc (NIH): 87 mg/dL (ref 0–99)
Triglycerides: 75 mg/dL (ref 0–149)
VLDL Cholesterol Cal: 14 mg/dL (ref 5–40)

## 2019-11-14 NOTE — Progress Notes (Signed)
LMTCB JBB 11/8

## 2019-11-21 DIAGNOSIS — Z961 Presence of intraocular lens: Secondary | ICD-10-CM | POA: Diagnosis not present

## 2019-11-21 DIAGNOSIS — H2511 Age-related nuclear cataract, right eye: Secondary | ICD-10-CM | POA: Diagnosis not present

## 2019-11-21 LAB — HM DIABETES EYE EXAM

## 2019-12-06 ENCOUNTER — Other Ambulatory Visit: Payer: Self-pay | Admitting: Family Medicine

## 2019-12-07 ENCOUNTER — Other Ambulatory Visit: Payer: Self-pay | Admitting: Family Medicine

## 2019-12-22 DIAGNOSIS — H2511 Age-related nuclear cataract, right eye: Secondary | ICD-10-CM | POA: Diagnosis not present

## 2020-01-19 DIAGNOSIS — I5032 Chronic diastolic (congestive) heart failure: Secondary | ICD-10-CM | POA: Diagnosis not present

## 2020-01-19 DIAGNOSIS — R002 Palpitations: Secondary | ICD-10-CM | POA: Diagnosis not present

## 2020-01-19 DIAGNOSIS — I25118 Atherosclerotic heart disease of native coronary artery with other forms of angina pectoris: Secondary | ICD-10-CM | POA: Diagnosis not present

## 2020-01-19 DIAGNOSIS — I10 Essential (primary) hypertension: Secondary | ICD-10-CM | POA: Diagnosis not present

## 2020-01-20 ENCOUNTER — Other Ambulatory Visit: Payer: Self-pay | Admitting: Family Medicine

## 2020-01-20 ENCOUNTER — Ambulatory Visit: Payer: BC Managed Care – PPO | Admitting: Family Medicine

## 2020-01-20 ENCOUNTER — Other Ambulatory Visit: Payer: Self-pay

## 2020-01-20 ENCOUNTER — Encounter: Payer: Self-pay | Admitting: Family Medicine

## 2020-01-20 VITALS — BP 133/72 | HR 91 | Ht 68.5 in | Wt 202.0 lb

## 2020-01-20 DIAGNOSIS — M6520 Calcific tendinitis, unspecified site: Secondary | ICD-10-CM | POA: Diagnosis not present

## 2020-01-20 MED ORDER — AMITRIPTYLINE HCL 50 MG PO TABS
50.0000 mg | ORAL_TABLET | Freq: Every day | ORAL | 3 refills | Status: DC
Start: 2020-01-20 — End: 2020-08-22

## 2020-01-20 NOTE — Progress Notes (Signed)
BP 133/72   Pulse 91   Ht 5' 8.5" (1.74 m)   Wt 202 lb (91.6 kg)   SpO2 100%   BMI 30.27 kg/m    Subjective:   Patient ID: Johnny Mathews, male    DOB: Mar 20, 1960, 60 y.o.   MRN: [id number]  HPI: Johnny Mathews is a 60 y.o. male presenting on 01/20/2020 for No chief complaint on file.   HPI Patient has a nodule on the posterior aspect of his right hand near the MCP joint of his middle finger that irritates him and causes some discomfort.  He did have a surgery trying to remove part of a nodule that was over that middle finger before and he thinks this may be a remnant of that.  He had a hand surgeon due to this and he would like to go back to see them to see if they could remove the rest of it that he can fill.  He is worried that it is damaging his joint in that hand.  Relevant past medical, surgical, family and social history reviewed and updated as indicated. Interim medical history since our last visit reviewed. Allergies and medications reviewed and updated.  Review of Systems  Constitutional: Negative for chills and fever.  Respiratory: Negative for shortness of breath and wheezing.   Cardiovascular: Negative for chest pain and leg swelling.  Musculoskeletal: Positive for arthralgias. Negative for back pain and gait problem.  Skin: Negative for rash.  Neurological: Negative for dizziness, weakness and light-headedness.  All other systems reviewed and are negative.   Per HPI unless specifically indicated above   Allergies as of 01/20/2020      Reactions   Eggs Or Egg-derived Products Swelling   Throat- 03-31-2019 pt states can eat cakes and Pie with no issues  Throat- 03-31-2019 pt states can eat cakes and Pie with no issues    Galactose    Alpha- Gal    Septra [sulfamethoxazole-trimethoprim] Swelling   throat   Sulfonamide Derivatives    REACTION: hives      Medication List       Accurate as of January 20, 2020  2:43 PM. If you have any questions, ask your  nurse or doctor.        albuterol 108 (90 Base) MCG/ACT inhaler Commonly known as: Ventolin HFA Inhale 2 puffs into the lungs every 4 (four) hours as needed. (Needs to be seen before next refill)   albuterol (2.5 MG/3ML) 0.083% nebulizer solution Commonly known as: PROVENTIL NEBULIZE 1 VIAL EVERY 6 HOURS AS NEEDED FOR WHEEZING & SHORTNESS OF BREATH   amitriptyline 25 MG tablet Commonly known as: ELAVIL Take 3 tablets (75 mg total) by mouth at bedtime.   azelastine 0.1 % nasal spray Commonly known as: ASTELIN USE 1 SPRAY IN EACH NOSTRIL TWICE DAILY AS NEEDED   Contour Blood Glucose System w/Device Kit Test blood sugars four times daily   Dulera 100-5 MCG/ACT Aero Generic drug: mometasone-formoterol USE 2 PUFFS TWICE DAILY   EPINEPHrine 0.3 mg/0.3 mL Devi Commonly known as: EPI-PEN Inject 0.3 mg into the skin as needed.   esomeprazole 40 MG capsule Commonly known as: NEXIUM TAKE 1 CAPSULE DAILY AT NOON   famotidine 20 MG tablet Commonly known as: PEPCID Take 1 tablet (20 mg total) by mouth 2 (two) times daily.   ferrous sulfate 325 (65 FE) MG tablet Take 325 mg by mouth daily with breakfast.   finasteride 5 MG tablet Commonly known as: PROSCAR TAKE  ONE (1) TABLET EACH DAY   fluticasone 50 MCG/ACT nasal spray Commonly known as: FLONASE Place 1-2 sprays into both nostrils daily.   furosemide 20 MG tablet Commonly known as: LASIX Take 1 tablet (20 mg total) by mouth daily as needed.   glucose blood test strip Commonly known as: Contour Next Test Test blood sugars four times daily   losartan 50 MG tablet Commonly known as: COZAAR Take 1 tablet (50 mg total) by mouth daily.   Meclizine HCl 25 MG Chew Chew 1 tablet (25 mg total) by mouth at bedtime.   meloxicam 7.5 MG tablet Commonly known as: MOBIC TAKE ONE (1) TABLET EACH DAY   montelukast 10 MG tablet Commonly known as: SINGULAIR TAKE ONE TABLET DAILY AT BEDTIME   pravastatin 80 MG tablet Commonly  known as: PRAVACHOL TAKE ONE (1) TABLET EACH DAY   sildenafil 20 MG tablet Commonly known as: REVATIO TAKE 1-3 TABLETS BY MOUTH AS NEEDED   tamsulosin 0.4 MG Caps capsule Commonly known as: FLOMAX TAKE 1 CAPSULE DAILY AFTER SUPPER   Testosterone 1.62 % Gel PLACE 2 PUMPS ON 1 SHOULDER & PLACE 1 PUMP ON THE OTHER SHOULDER DAILY(ALTERNATE SHOULDERS EVERY OTHER DAY)   Trulicity 1.58 XE/9.4MH Sopn Generic drug: Dulaglutide Inject 0.75 mg into the skin once a week.        Objective:   There were no vitals taken for this visit.  Wt Readings from Last 3 Encounters:  11/09/19 198 lb (89.8 kg)  05/05/19 199 lb 6 oz (90.4 kg)  04/14/19 195 lb (88.5 kg)    Physical Exam Vitals and nursing note reviewed.  Constitutional:      General: He is not in acute distress.    Appearance: He is well-developed and well-nourished. He is not diaphoretic.  Eyes:     General: No scleral icterus.    Extraocular Movements: EOM normal.     Conjunctiva/sclera: Conjunctivae normal.  Neck:     Thyroid: No thyromegaly.  Cardiovascular:     Pulses: Intact distal pulses.  Musculoskeletal:        General: No edema. Normal range of motion.     Right hand: Tenderness present.       Arms:  Skin:    General: Skin is warm and dry.     Findings: No rash.  Neurological:     Mental Status: He is alert and oriented to person, place, and time.     Coordination: Coordination normal.  Psychiatric:        Mood and Affect: Mood and affect normal.        Behavior: Behavior normal.       Assessment & Plan:   Problem List Items Addressed This Visit   None   Visit Diagnoses    Calcific tendonitis    -  Primary   Relevant Orders   Ambulatory referral to Orthopedic Surgery      Will refer back to his surgeon that did it previously. Follow up plan: Return if symptoms worsen or fail to improve.  Counseling provided for all of the vaccine components No orders of the defined types were placed in this  encounter.   Caryl Pina, MD [facility] 01/20/2020, 2:43 PM

## 2020-01-23 ENCOUNTER — Other Ambulatory Visit: Payer: Self-pay | Admitting: Internal Medicine

## 2020-01-23 DIAGNOSIS — J849 Interstitial pulmonary disease, unspecified: Secondary | ICD-10-CM

## 2020-02-06 DIAGNOSIS — F902 Attention-deficit hyperactivity disorder, combined type: Secondary | ICD-10-CM | POA: Diagnosis not present

## 2020-02-06 DIAGNOSIS — Z79899 Other long term (current) drug therapy: Secondary | ICD-10-CM | POA: Diagnosis not present

## 2020-02-07 DIAGNOSIS — R002 Palpitations: Secondary | ICD-10-CM | POA: Diagnosis not present

## 2020-02-09 DIAGNOSIS — I471 Supraventricular tachycardia: Secondary | ICD-10-CM | POA: Diagnosis not present

## 2020-02-09 DIAGNOSIS — I472 Ventricular tachycardia: Secondary | ICD-10-CM | POA: Diagnosis not present

## 2020-02-10 ENCOUNTER — Other Ambulatory Visit: Payer: Self-pay | Admitting: Family Medicine

## 2020-02-10 NOTE — Telephone Encounter (Signed)
Please advise.  Seen 11/2019

## 2020-02-13 ENCOUNTER — Other Ambulatory Visit: Payer: Self-pay | Admitting: Family Medicine

## 2020-02-14 ENCOUNTER — Other Ambulatory Visit: Payer: Self-pay | Admitting: Family Medicine

## 2020-02-23 ENCOUNTER — Other Ambulatory Visit: Payer: Self-pay

## 2020-02-23 ENCOUNTER — Ambulatory Visit (INDEPENDENT_AMBULATORY_CARE_PROVIDER_SITE_OTHER): Payer: BC Managed Care – PPO | Admitting: Family Medicine

## 2020-02-23 ENCOUNTER — Encounter: Payer: Self-pay | Admitting: Family Medicine

## 2020-02-23 VITALS — BP 120/66 | HR 61 | Temp 98.3°F | Ht 68.5 in | Wt 193.6 lb

## 2020-02-23 DIAGNOSIS — K219 Gastro-esophageal reflux disease without esophagitis: Secondary | ICD-10-CM

## 2020-02-23 DIAGNOSIS — Z0001 Encounter for general adult medical examination with abnormal findings: Secondary | ICD-10-CM | POA: Diagnosis not present

## 2020-02-23 DIAGNOSIS — E785 Hyperlipidemia, unspecified: Secondary | ICD-10-CM

## 2020-02-23 DIAGNOSIS — Z0000 Encounter for general adult medical examination without abnormal findings: Secondary | ICD-10-CM

## 2020-02-23 DIAGNOSIS — I10 Essential (primary) hypertension: Secondary | ICD-10-CM | POA: Diagnosis not present

## 2020-02-23 DIAGNOSIS — E119 Type 2 diabetes mellitus without complications: Secondary | ICD-10-CM

## 2020-02-23 LAB — CMP14+EGFR
ALT: 34 IU/L (ref 0–44)
AST: 38 IU/L (ref 0–40)
Albumin/Globulin Ratio: 2.1 (ref 1.2–2.2)
Albumin: 4.8 g/dL (ref 3.8–4.8)
Alkaline Phosphatase: 129 IU/L — ABNORMAL HIGH (ref 44–121)
BUN/Creatinine Ratio: 22 (ref 10–24)
BUN: 27 mg/dL (ref 8–27)
Bilirubin Total: 0.4 mg/dL (ref 0.0–1.2)
CO2: 22 mmol/L (ref 20–29)
Calcium: 9.9 mg/dL (ref 8.6–10.2)
Chloride: 103 mmol/L (ref 96–106)
Creatinine, Ser: 1.21 mg/dL (ref 0.76–1.27)
GFR calc Af Amer: 74 mL/min/{1.73_m2} (ref >59)
GFR calc non Af Amer: 64 mL/min/{1.73_m2} (ref >59)
Globulin, Total: 2.3 g/dL (ref 1.5–4.5)
Glucose: 84 mg/dL (ref 65–99)
Potassium: 5.1 mmol/L (ref 3.5–5.2)
Sodium: 141 mmol/L (ref 134–144)
Total Protein: 7.1 g/dL (ref 6.0–8.5)

## 2020-02-23 LAB — BAYER DCA HB A1C WAIVED: HB A1C (BAYER DCA - WAIVED): 4.9 % (ref <7.0)

## 2020-02-23 MED ORDER — DULERA 100-5 MCG/ACT IN AERO: 2.0000 | INHALATION_SPRAY | Freq: Two times a day (BID) | RESPIRATORY_TRACT | 3 refills | Status: DC

## 2020-02-23 MED ORDER — ESOMEPRAZOLE MAGNESIUM 40 MG PO CPDR: 40.0000 mg | DELAYED_RELEASE_CAPSULE | Freq: Every day | ORAL | 3 refills | Status: DC

## 2020-02-23 MED ORDER — TRULICITY 0.75 MG/0.5ML ~~LOC~~ SOAJ
0.7500 mg | SUBCUTANEOUS | 3 refills | Status: DC
Start: 2020-02-23 — End: 2020-09-24

## 2020-02-23 NOTE — Progress Notes (Signed)
BP 120/66   Pulse 61   Temp 98.3 F (36.8 C)   Ht 5' 8.5" (1.74 m)   Wt 193 lb 9.6 oz (87.8 kg)   SpO2 99%   BMI 29.01 kg/m    Subjective:   Patient ID: Johnny Mathews, male    DOB: [birth year], 61 y.o.   MRN: [id number]  HPI: Johnny Mathews is a 60 y.o. male presenting on 02/23/2020 for Annual Exam   HPI Annual wellness exam Patient denies any chest pain, shortness of breath, headaches or vision issues, abdominal complaints, diarrhea, nausea, vomiting, or joint issues.   Type 2 diabetes mellitus Patient comes in today for recheck of his diabetes. Patient has been currently taking Trulicity. Patient is currently on an ACE inhibitor/ARB. Patient has not seen an ophthalmologist this year. Patient denies any issues with their feet. The symptom started onset as an adult htn and hld ARE RELATED TO DM   Hyperlipidemia Patient is coming in for recheck of his hyperlipidemia. The patient is currently taking pravastatin. They deny any issues with myalgias or history of liver damage from it. They deny any focal numbness or weakness or chest pain.   Hypertension Patient is currently on furosemide and losartan and metoprolol, and their blood pressure today is 120/66. Patient denies any lightheadedness or dizziness. Patient denies headaches, blurred vision, chest pains, shortness of breath, or weakness. Denies any side effects from medication and is content with current medication.   Relevant past medical, surgical, family and social history reviewed and updated as indicated. Interim medical history since our last visit reviewed. Allergies and medications reviewed and updated.  Review of Systems  Constitutional: Negative for chills and fever.  HENT: Negative for ear pain and tinnitus.   Eyes: Negative for pain and discharge.  Respiratory: Negative for cough, shortness of breath and wheezing.   Cardiovascular: Negative for chest pain, palpitations and leg swelling.  Gastrointestinal:  Negative for abdominal pain, blood in stool, constipation and diarrhea.  Genitourinary: Negative for dysuria and hematuria.  Musculoskeletal: Negative for back pain, gait problem and myalgias.  Skin: Negative for rash.  Neurological: Negative for dizziness, weakness and headaches.  Psychiatric/Behavioral: Negative for suicidal ideas.  All other systems reviewed and are negative.   Per HPI unless specifically indicated above   Allergies as of 02/23/2020      Reactions   Eggs Or Egg-derived Products Swelling   Throat- 03-31-2019 pt states can eat cakes and Pie with no issues  Throat- 03-31-2019 pt states can eat cakes and Pie with no issues    Galactose    Alpha- Gal    Septra [sulfamethoxazole-trimethoprim] Swelling   throat   Sulfonamide Derivatives    REACTION: hives      Medication List       Accurate as of February 23, 2020 10:05 AM. If you have any questions, ask your nurse or doctor.        albuterol 108 (90 Base) MCG/ACT inhaler Commonly known as: Ventolin HFA Inhale 2 puffs into the lungs every 4 (four) hours as needed. (Needs to be seen before next refill)   albuterol (2.5 MG/3ML) 0.083% nebulizer solution Commonly known as: PROVENTIL NEBULIZE 1 VIAL EVERY 6 HOURS AS NEEDED FOR WHEEZING & SHORTNESS OF BREATH   amitriptyline 50 MG tablet Commonly known as: ELAVIL Take 1 tablet (50 mg total) by mouth at bedtime.   azelastine 0.1 % nasal spray Commonly known as: ASTELIN USE 1 SPRAY IN EACH NOSTRIL TWICE DAILY AS  NEEDED   Contour Blood Glucose System w/Device Kit Test blood sugars four times daily   Dulera 100-5 MCG/ACT Aero Generic drug: mometasone-formoterol Inhale 2 puffs into the lungs in the morning and at bedtime. What changed: See the new instructions. Changed by: Fransisca Kaufmann Jackee Glasner, MD   EPINEPHrine 0.3 mg/0.3 mL Devi Commonly known as: EPI-PEN Inject 0.3 mg into the skin as needed.   esomeprazole 40 MG capsule Commonly known as: NEXIUM Take 1  capsule (40 mg total) by mouth daily at 12 noon. What changed: See the new instructions. Changed by: Fransisca Kaufmann Thomas Rhude, MD   famotidine 20 MG tablet Commonly known as: PEPCID Take 1 tablet (20 mg total) by mouth 2 (two) times daily.   ferrous sulfate 325 (65 FE) MG tablet Take 325 mg by mouth daily with breakfast.   finasteride 5 MG tablet Commonly known as: PROSCAR TAKE ONE (1) TABLET EACH DAY   fluticasone 50 MCG/ACT nasal spray Commonly known as: FLONASE Place 1-2 sprays into both nostrils daily.   furosemide 20 MG tablet Commonly known as: LASIX Take 1 tablet (20 mg total) by mouth daily as needed.   glucose blood test strip Commonly known as: Contour Next Test Test blood sugars four times daily   losartan 50 MG tablet Commonly known as: COZAAR Take 1 tablet (50 mg total) by mouth daily.   Meclizine HCl 25 MG Chew Chew 1 tablet (25 mg total) by mouth at bedtime.   meloxicam 7.5 MG tablet Commonly known as: MOBIC TAKE ONE (1) TABLET EACH DAY   metoprolol succinate 25 MG 24 hr tablet Commonly known as: TOPROL-XL Take 25 mg by mouth daily.   montelukast 10 MG tablet Commonly known as: SINGULAIR TAKE ONE TABLET DAILY AT BEDTIME   pravastatin 80 MG tablet Commonly known as: PRAVACHOL TAKE ONE (1) TABLET EACH DAY   sildenafil 20 MG tablet Commonly known as: REVATIO TAKE 1 TO 3 TABLETS DAILY AS NEEDED   tamsulosin 0.4 MG Caps capsule Commonly known as: FLOMAX TAKE 1 CAPSULE DAILY AFTER SUPPER   Testosterone 1.62 % Gel PLACE 2 PUMPS ON 1 SHOULDER & PLACE 1 PUMP ON THE OTHER SHOULDER DAILY(ALTERNATE SHOULDERS EVERY OTHER DAY)   Trulicity 9.16 XI/5.0TU Sopn Generic drug: Dulaglutide Inject 0.75 mg into the skin once a week. What changed: See the new instructions. Changed by: Fransisca Kaufmann Jodiann Ognibene, MD        Objective:   BP 120/66   Pulse 61   Temp 98.3 F (36.8 C)   Ht 5' 8.5" (1.74 m)   Wt 193 lb 9.6 oz (87.8 kg)   SpO2 99%   BMI 29.01 kg/m    Wt Readings from Last 3 Encounters:  02/23/20 193 lb 9.6 oz (87.8 kg)  01/20/20 202 lb (91.6 kg)  11/09/19 198 lb (89.8 kg)    Physical Exam Vitals reviewed.  Constitutional:      General: He is not in acute distress.    Appearance: He is well-developed and well-nourished. He is not diaphoretic.  HENT:     Right Ear: External ear normal.     Left Ear: External ear normal.     Nose: Nose normal.     Mouth/Throat:     Mouth: Oropharynx is clear and moist.     Pharynx: No oropharyngeal exudate.  Eyes:     General: No scleral icterus.    Extraocular Movements: EOM normal.     Conjunctiva/sclera: Conjunctivae normal.  Neck:     Thyroid: No  thyromegaly.  Cardiovascular:     Rate and Rhythm: Normal rate and regular rhythm.     Pulses: Intact distal pulses.     Heart sounds: Normal heart sounds. No murmur heard.   Pulmonary:     Effort: Pulmonary effort is normal. No respiratory distress.     Breath sounds: Normal breath sounds. No wheezing.  Abdominal:     General: Bowel sounds are normal. There is no distension.     Palpations: Abdomen is soft.     Tenderness: There is no abdominal tenderness. There is no guarding or rebound.  Musculoskeletal:        General: No edema. Normal range of motion.     Cervical back: Neck supple.  Lymphadenopathy:     Cervical: No cervical adenopathy.  Skin:    General: Skin is warm and dry.     Findings: No rash.  Neurological:     Mental Status: He is alert and oriented to person, place, and time.     Coordination: Coordination normal.  Psychiatric:        Mood and Affect: Mood and affect normal.        Behavior: Behavior normal.       Assessment & Plan:   Problem List Items Addressed This Visit      Cardiovascular and Mediastinum   Essential hypertension   Relevant Medications   metoprolol succinate (TOPROL-XL) 25 MG 24 hr tablet   Other Relevant Orders   CMP14+EGFR     Digestive   GERD (gastroesophageal reflux disease)    Relevant Medications   esomeprazole (NEXIUM) 40 MG capsule     Endocrine   Type 2 diabetes mellitus without complication, without long-term current use of insulin (HCC)   Relevant Medications   Dulaglutide (TRULICITY) 9.15 AV/6.9VX SOPN   Other Relevant Orders   Bayer DCA Hb A1c Waived     Other   Hyperlipidemia with target LDL less than 100   Relevant Medications   metoprolol succinate (TOPROL-XL) 25 MG 24 hr tablet    Other Visit Diagnoses    Well adult exam    -  Primary      Continue current medication, seems to be doing well, see back in 6 months. Follow up plan: Return in about 6 months (around 08/22/2020), or if symptoms worsen or fail to improve, for Diabetes and hyperlipidemia.  Counseling provided for all of the vaccine components Orders Placed This Encounter  Procedures  . CMP14+EGFR  . [facility] Hb A1c Jeffers, MD [facility] 02/23/2020, 10:05 AM

## 2020-02-24 ENCOUNTER — Telehealth: Payer: Self-pay | Admitting: *Deleted

## 2020-02-24 ENCOUNTER — Encounter: Payer: Self-pay | Admitting: Family Medicine

## 2020-02-24 NOTE — Telephone Encounter (Signed)
Trulicity 0.75MG /0.5ML pen-injectors PA started   Key: [id number] Immediate approval   Approved today Effective from 02/24/2020 through 02/22/2021.  [facility]

## 2020-04-05 DIAGNOSIS — R059 Cough, unspecified: Secondary | ICD-10-CM | POA: Diagnosis not present

## 2020-04-05 DIAGNOSIS — Z6827 Body mass index (BMI) 27.0-27.9, adult: Secondary | ICD-10-CM | POA: Diagnosis not present

## 2020-04-05 DIAGNOSIS — R0602 Shortness of breath: Secondary | ICD-10-CM | POA: Diagnosis not present

## 2020-04-05 DIAGNOSIS — D649 Anemia, unspecified: Secondary | ICD-10-CM | POA: Insufficient documentation

## 2020-04-05 DIAGNOSIS — R0989 Other specified symptoms and signs involving the circulatory and respiratory systems: Secondary | ICD-10-CM | POA: Diagnosis not present

## 2020-04-24 ENCOUNTER — Other Ambulatory Visit: Payer: Self-pay | Admitting: Family Medicine

## 2020-04-24 NOTE — Telephone Encounter (Signed)
Last office visit 02/23/20 Last refill 09/24/18, #90, 3 refills

## 2020-04-27 DIAGNOSIS — H04123 Dry eye syndrome of bilateral lacrimal glands: Secondary | ICD-10-CM | POA: Diagnosis not present

## 2020-05-10 ENCOUNTER — Ambulatory Visit (INDEPENDENT_AMBULATORY_CARE_PROVIDER_SITE_OTHER): Payer: BC Managed Care – PPO | Admitting: Nurse Practitioner

## 2020-05-10 VITALS — BP 119/108 | HR 71 | Temp 97.9°F | Resp 18

## 2020-05-10 DIAGNOSIS — R002 Palpitations: Secondary | ICD-10-CM | POA: Diagnosis not present

## 2020-05-10 DIAGNOSIS — Z8616 Personal history of COVID-19: Secondary | ICD-10-CM | POA: Insufficient documentation

## 2020-05-10 DIAGNOSIS — I998 Other disorder of circulatory system: Secondary | ICD-10-CM

## 2020-05-10 MED ORDER — BENZONATATE 100 MG PO CAPS
100.0000 mg | ORAL_CAPSULE | Freq: Two times a day (BID) | ORAL | 0 refills | Status: DC | PRN
Start: 2020-05-10 — End: 2020-08-10

## 2020-05-10 NOTE — Assessment & Plan Note (Signed)
ILD Cough:   Stay well hydrated  Stay active  Deep breathing exercises  May start vitamin C daily, vitamin D3 daily, Zinc daily  May take tylenol or fever or pain  Will place referral to EP - second opinion  Will place referral to pulmonary - needs pulmonary rehab and follow up - chronic cough / ILD    Follow up:  Follow up if needed

## 2020-05-10 NOTE — Progress Notes (Signed)
@Patient  ID: Johnny Mathews, male    DOB: November 10, 1960, 60 y.o.   MRN: [id number]  Chief Complaint  Patient presents with  . Cough    Referring provider: Dettinger, Fransisca Kaufmann, MD  HPI  Patient presents today for post-COVID care clinic visit.  Patient states that he tested positive for COVID in November 2020.  He states that he has ongoing issues with chronic cough, tachycardia, heart palpitations, blood pressure fluctuation.  Patient has been seen by pulmonary but unfortunately did not follow-up as directed.  He was diagnosed with post-COVID fibrosis and was seen there 1 year ago.  At that time he was ordered pulmonary rehab but again unfortunately did not go to any of the rehab appointments.  We discussed that it is very important for him to follow-up with pulmonary and to do pulmonary rehab.  I will try to get him a follow-up scheduled with him.  He has been seeing cardiology with [facility].  We discussed that I can place a referral for a second opinion with our EP specialist.  Patient is agreeable to this. Denies f/c/s, n/v/d, hemoptysis, PND, chest pain or edema.     Allergies  Allergen Reactions  . Eggs Or Egg-Derived Products Swelling    Throat- 03-31-2019 pt states can eat cakes and Pie with no issues  Throat- 03-31-2019 pt states can eat cakes and Pie with no issues   . Galactose     Alpha- Gal   . Septra [Sulfamethoxazole-Trimethoprim] Swelling    throat  . Sulfonamide Derivatives     REACTION: hives    Immunization History  Administered Date(s) Administered  . Influenza Inj Mdck Quad Pf 09/24/2018  . Influenza, Quadrivalent, Recombinant, Inj, Pf 01/15/2017  . Influenza-Unspecified 09/24/2018, 10/31/2019  . Moderna Sars-Covid-2 Vaccination 01/15/2019, 02/12/2019, 09/07/2019  . Pneumococcal Polysaccharide-23 11/09/2019  . Tdap 07/20/2009, 11/09/2019    Past Medical History:  Diagnosis Date  . Allergy   . Alpha galactosidase deficiency   . Anemia    past hx   . Arthritis   .  Asthma   . Blood transfusion without reported diagnosis   . BPH (benign prostatic hypertrophy)   . Cataract    removed left eye   . Complication of anesthesia   . Diabetes mellitus without complication ([place])   . GERD (gastroesophageal reflux disease)   . HOH (hard of hearing)   . Hyperlipidemia   . Hypertension   . Irregular heart beat   . Muscular dystrophy ([place])   . PONV (postoperative nausea and vomiting)   . Wears hearing aid in both ears     Tobacco History: Social History   Tobacco Use  Smoking Status Former Smoker  . Packs/day: 1.00  . Years: 10.00  . Pack years: 10.00  . Types: Cigarettes  . Quit date: 01/06/1994  . Years since quitting: 26.3  Smokeless Tobacco Never Used   Counseling given: Yes   Outpatient Encounter Medications as of 05/10/2020  Medication Sig  . benzonatate (TESSALON) 100 MG capsule Take 1 capsule (100 mg total) by mouth 2 (two) times daily as needed for cough.  Marland Kitchen albuterol (PROVENTIL) (2.5 MG/3ML) 0.083% nebulizer solution NEBULIZE 1 VIAL EVERY 6 HOURS AS NEEDED FOR WHEEZING & SHORTNESS OF BREATH  . albuterol (VENTOLIN HFA) 108 (90 Base) MCG/ACT inhaler Inhale 2 puffs into the lungs every 4 (four) hours as needed. (Needs to be seen before next refill)  . amitriptyline (ELAVIL) 50 MG tablet Take 1 tablet (50 mg total) by mouth at  bedtime.  Marland Kitchen azelastine (ASTELIN) 0.1 % nasal spray USE 1 SPRAY IN EACH NOSTRIL TWICE DAILY AS NEEDED  . Blood Glucose Monitoring Suppl (CONTOUR BLOOD GLUCOSE SYSTEM) w/Device KIT Test blood sugars four times daily  . Dulaglutide (TRULICITY) 3.47 QQ/5.9DG SOPN Inject 0.75 mg into the skin once a week.  Marland Kitchen EPINEPHrine (EPI-PEN) 0.3 mg/0.3 mL DEVI Inject 0.3 mg into the skin as needed.   Marland Kitchen esomeprazole (NEXIUM) 40 MG capsule Take 1 capsule (40 mg total) by mouth daily at 12 noon.  . famotidine (PEPCID) 20 MG tablet Take 1 tablet (20 mg total) by mouth 2 (two) times daily.  . ferrous sulfate 325 (65 FE) MG tablet Take 325 mg by  mouth daily with breakfast.  . finasteride (PROSCAR) 5 MG tablet TAKE ONE (1) TABLET EACH DAY  . fluticasone (FLONASE) 50 MCG/ACT nasal spray Place 1-2 sprays into both nostrils daily.  . furosemide (LASIX) 20 MG tablet Take 1 tablet (20 mg total) by mouth daily as needed.  Marland Kitchen glucose blood (CONTOUR NEXT TEST) test strip Test blood sugars four times daily  . losartan (COZAAR) 50 MG tablet Take 1 tablet (50 mg total) by mouth daily.  . Meclizine HCl 25 MG CHEW TAKE ONE TABLET DAILY AT BEDTIME  . meloxicam (MOBIC) 7.5 MG tablet TAKE ONE (1) TABLET EACH DAY  . metoprolol succinate (TOPROL-XL) 25 MG 24 hr tablet Take 25 mg by mouth daily.  . mometasone-formoterol (DULERA) 100-5 MCG/ACT AERO Inhale 2 puffs into the lungs in the morning and at bedtime.  . montelukast (SINGULAIR) 10 MG tablet TAKE ONE TABLET DAILY AT BEDTIME  . pravastatin (PRAVACHOL) 80 MG tablet TAKE ONE (1) TABLET EACH DAY  . sildenafil (REVATIO) 20 MG tablet TAKE 1 TO 3 TABLETS DAILY AS NEEDED  . tamsulosin (FLOMAX) 0.4 MG CAPS capsule TAKE 1 CAPSULE DAILY AFTER SUPPER  . Testosterone 1.62 % GEL PLACE 2 PUMPS ON 1 SHOULDER & PLACE 1 PUMP ON THE OTHER SHOULDER DAILY(ALTERNATE SHOULDERS EVERY OTHER DAY)   No facility-administered encounter medications on file as of 05/10/2020.     Review of Systems  Review of Systems  Constitutional: Negative.  Negative for fatigue and fever.  HENT: Negative.   Respiratory: Positive for cough and shortness of breath.   Cardiovascular: Positive for palpitations.  Gastrointestinal: Negative.   Allergic/Immunologic: Negative.   Neurological: Negative.   Psychiatric/Behavioral: Negative.        Physical Exam  BP (!) 119/108   Pulse 71   Temp 97.9 F (36.6 C)   Resp 18   SpO2 99%   Wt Readings from Last 5 Encounters:  02/23/20 193 lb 9.6 oz (87.8 kg)  01/20/20 202 lb (91.6 kg)  11/09/19 198 lb (89.8 kg)  05/05/19 199 lb 6 oz (90.4 kg)  04/14/19 195 lb (88.5 kg)     Physical  Exam Vitals and nursing note reviewed.  Constitutional:      General: He is not in acute distress.    Appearance: He is well-developed.  Cardiovascular:     Rate and Rhythm: Normal rate and regular rhythm.     Heart sounds: Murmur heard.    Pulmonary:     Effort: Pulmonary effort is normal.     Breath sounds: Normal breath sounds.  Skin:    General: Skin is warm and dry.  Neurological:     Mental Status: He is alert and oriented to person, place, and time.        Assessment & Plan:  History of COVID-19 ILD Cough:   Stay well hydrated  Stay active  Deep breathing exercises  May start vitamin C daily, vitamin D3 daily, Zinc daily  May take tylenol or fever or pain  Will place referral to EP - second opinion  Will place referral to pulmonary - needs pulmonary rehab and follow up - chronic cough / ILD    Follow up:  Follow up if needed      Fenton Foy, NP 05/10/2020

## 2020-05-10 NOTE — Patient Instructions (Addendum)
History of Covid 19 ILD Cough:   Stay well hydrated  Stay active  Deep breathing exercises  May start vitamin C daily, vitamin D3 daily, Zinc daily  May take tylenol or fever or pain  Will place referral to EP - second opinion  Will place referral to pulmonary - needs pulmonary rehab and follow up - chronic cough / ILD    Follow up:  Follow up if needed

## 2020-05-11 ENCOUNTER — Telehealth: Payer: Self-pay | Admitting: Pulmonary Disease

## 2020-05-11 NOTE — Telephone Encounter (Signed)
I am ok with that.  BLI

## 2020-05-11 NOTE — Telephone Encounter (Signed)
Dr. Valeta Harms please advise if you are ok with taking on this patient. Thank you.

## 2020-05-14 NOTE — Telephone Encounter (Signed)
OK. Sure.  

## 2020-05-14 NOTE — Telephone Encounter (Signed)
Pt scheduled with Dr. Valeta Harms on 06/14/20 at 10am. Nothing further needed at this time.

## 2020-05-14 NOTE — Telephone Encounter (Signed)
Dr Vaughan Browner, please advise if you are ok with pt changing to Dr. Valeta Harms. Thanks.

## 2020-05-22 ENCOUNTER — Other Ambulatory Visit: Payer: Self-pay | Admitting: Family Medicine

## 2020-06-05 ENCOUNTER — Institutional Professional Consult (permissible substitution): Payer: BC Managed Care – PPO | Admitting: Cardiology

## 2020-06-05 NOTE — Progress Notes (Deleted)
Electrophysiology Office Note   Date:  06/05/2020   ID:  Lacey, Dotson 1960/06/29, MRN [id number]  PCP:  Dettinger, Fransisca Kaufmann, MD  Cardiologist:  [place] Primary Electrophysiologist:  Ankit Degregorio Meredith Leeds, MD    Chief Complaint: ***   History of Present Illness: Johnny Mathews is a 60 y.o. male who is being seen today for the evaluation of ***at the request of Fenton Foy, NP. Presenting today for electrophysiology evaluation.  He has a history of COVID-25 October 2018, hypertension, aortic stenosis.  Around the time of his COVID-19 diagnosis, he had a transthoracic echo that showed a preserved LV systolic function, grade 1 diastolic dysfunction, and mild aortic stenosis.  He had a CT scan that showed elevated coronary calcium.  He has been having increasing palpitations.  He has had no weight change, orthopnea, PND, or edema.  Today, he denies*** symptoms of palpitations, chest pain, shortness of breath, orthopnea, PND, lower extremity edema, claudication, dizziness, presyncope, syncope, bleeding, or neurologic sequela. The patient is tolerating medications without difficulties.    Past Medical History:  Diagnosis Date  . Allergy   . Alpha galactosidase deficiency   . Anemia    past hx   . Arthritis   . Asthma   . Blood transfusion without reported diagnosis   . BPH (benign prostatic hypertrophy)   . Cataract    removed left eye   . Complication of anesthesia   . Diabetes mellitus without complication ([place])   . GERD (gastroesophageal reflux disease)   . HOH (hard of hearing)   . Hyperlipidemia   . Hypertension   . Irregular heart beat   . Muscular dystrophy ([place])   . PONV (postoperative nausea and vomiting)   . Wears hearing aid in both ears    Past Surgical History:  Procedure Laterality Date  . CATARACT EXTRACTION Left   . COLONOSCOPY    . FEMUR FRACTURE SURGERY    . KIDNEY STONE SURGERY     x6  . KNEE SURGERY Right    x2  . MASS EXCISION Right  08/02/2018   Procedure: EXCISION RIGHT LONG FINGER MASS, DEBRIDEMENT PROXIMAL INTERPHALANGEAL JOINT WITH ROTATION FLAP;  Surgeon: Leanora Cover, MD;  Location: [facility];  Service: Orthopedics;  Laterality: Right;  . NISSEN FUNDOPLICATION     [facility]   . SHOULDER OPEN ROTATOR CUFF REPAIR       Current Outpatient Medications  Medication Sig Dispense Refill  . albuterol (PROVENTIL) (2.5 MG/3ML) 0.083% nebulizer solution NEBULIZE 1 VIAL EVERY 6 HOURS AS NEEDED FOR WHEEZING & SHORTNESS OF BREATH 150 mL 1  . albuterol (VENTOLIN HFA) 108 (90 Base) MCG/ACT inhaler Inhale 2 puffs into the lungs every 4 (four) hours as needed. (Needs to be seen before next refill) 18 g 11  . amitriptyline (ELAVIL) 50 MG tablet Take 1 tablet (50 mg total) by mouth at bedtime. 90 tablet 3  . azelastine (ASTELIN) 0.1 % nasal spray USE 1 SPRAY IN EACH NOSTRIL TWICE DAILY AS NEEDED 30 mL 11  . benzonatate (TESSALON) 100 MG capsule Take 1 capsule (100 mg total) by mouth 2 (two) times daily as needed for cough. 20 capsule 0  . Blood Glucose Monitoring Suppl (CONTOUR BLOOD GLUCOSE SYSTEM) w/Device KIT Test blood sugars four times daily 200 each 5  . Dulaglutide (TRULICITY) 9.97 FS/1.4EL SOPN Inject 0.75 mg into the skin once a week. 2 mL 3  . EPINEPHrine (EPI-PEN) 0.3 mg/0.3 mL DEVI Inject 0.3 mg into  the skin as needed.     Marland Kitchen esomeprazole (NEXIUM) 40 MG capsule Take 1 capsule (40 mg total) by mouth daily at 12 noon. 90 capsule 3  . famotidine (PEPCID) 20 MG tablet Take 1 tablet (20 mg total) by mouth 2 (two) times daily. 180 tablet 3  . ferrous sulfate 325 (65 FE) MG tablet Take 325 mg by mouth daily with breakfast.    . finasteride (PROSCAR) 5 MG tablet TAKE ONE (1) TABLET EACH DAY 90 tablet 3  . fluticasone (FLONASE) 50 MCG/ACT nasal spray Place 1-2 sprays into both nostrils daily. 48 g 11  . furosemide (LASIX) 20 MG tablet Take 1 tablet (20 mg total) by mouth daily as needed. 90 tablet 3  . glucose blood  (CONTOUR NEXT TEST) test strip Test blood sugars four times daily 200 each 5  . losartan (COZAAR) 50 MG tablet Take 1 tablet (50 mg total) by mouth daily. 90 tablet 3  . Meclizine HCl 25 MG CHEW TAKE ONE TABLET DAILY AT BEDTIME 90 tablet 0  . meloxicam (MOBIC) 7.5 MG tablet TAKE ONE (1) TABLET EACH DAY 90 tablet 3  . metoprolol succinate (TOPROL-XL) 25 MG 24 hr tablet Take 25 mg by mouth daily.    . mometasone-formoterol (DULERA) 100-5 MCG/ACT AERO Inhale 2 puffs into the lungs in the morning and at bedtime. 13 g 3  . montelukast (SINGULAIR) 10 MG tablet TAKE ONE TABLET DAILY AT BEDTIME 90 tablet 1  . pravastatin (PRAVACHOL) 80 MG tablet TAKE ONE (1) TABLET EACH DAY 90 tablet 3  . sildenafil (REVATIO) 20 MG tablet TAKE 1 TO 3 TABLETS DAILY AS NEEDED 30 tablet 3  . tamsulosin (FLOMAX) 0.4 MG CAPS capsule TAKE 1 CAPSULE DAILY AFTER SUPPER 90 capsule 3  . Testosterone 1.62 % GEL PLACE 2 PUMPS ON 1 SHOULDER & PLACE 1 PUMP ON THE OTHER SHOULDER DAILY(ALTERNATE SHOULDERS EVERY OTHER DAY) 75 g 1   No current facility-administered medications for this visit.    Allergies:   Eggs or egg-derived products, Galactose, Septra [sulfamethoxazole-trimethoprim], and Sulfonamide derivatives   Social History:  The patient  reports that he quit smoking about 26 years ago. His smoking use included cigarettes. He has a 10.00 pack-year smoking history. He has never used smokeless tobacco. He reports current alcohol use. He reports that he does not use drugs.   Family History:  The patient's ***family history includes Breast cancer in his mother and sister; Heart disease in his father.    ROS:  Please see the history of present illness.   Otherwise, review of systems is positive for none.   All other systems are reviewed and negative.    PHYSICAL EXAM: VS:  There were no vitals taken for this visit. , BMI There is no height or weight on file to calculate BMI. GEN: Well nourished, well developed, in no acute  distress  HEENT: normal  Neck: no JVD, carotid bruits, or masses Cardiac: ***RRR; no murmurs, rubs, or gallops,no edema  Respiratory:  clear to auscultation bilaterally, normal work of breathing GI: soft, nontender, nondistended, + BS MS: no deformity or atrophy  Skin: warm and dry, ***device pocket is well healed Neuro:  Strength and sensation are intact Psych: euthymic mood, full affect  EKG:  EKG {ACTION; IS/IS TTS:17793903} ordered today. Personal review of the ekg ordered *** shows ***  ***Device interrogation is reviewed today in detail.  See PaceArt for details.   Recent Labs: 11/09/2019: Hemoglobin 13.6; Platelets 242 02/23/2020: ALT 34;  BUN 27; Creatinine, Ser 1.21; Potassium 5.1; Sodium 141    Lipid Panel     Component Value Date/Time   CHOL 186 11/09/2019 1008   TRIG 75 11/09/2019 1008   HDL 85 11/09/2019 1008   CHOLHDL 2.2 11/09/2019 1008   CHOLHDL 4.8 03/30/2009 1807   VLDL 20 03/30/2009 1807   LDLCALC 87 11/09/2019 1008     Wt Readings from Last 3 Encounters:  02/23/20 193 lb 9.6 oz (87.8 kg)  01/20/20 202 lb (91.6 kg)  11/09/19 198 lb (89.8 kg)      Other studies Reviewed: Additional studies/ records that were reviewed today include: TTE 12/16/18 Review of the above records today demonstrates:  1. The left ventricle is normal in size with mildly increased wall  thickness.  2. Normal left ventricular systolic function, ejection fraction > 55%.  3. Diastolic dysfunction - grade I (normal filling pressures).  4. Normal right ventricular size and systolic function.  5. Dilated left atrium - mildly to moderately dilated.  6. Aortic stenosis - mild.    ASSESSMENT AND PLAN:  1.  Palpitations:***  2.  Coronary artery disease: No significant ischemia on stress testing.  3.  Hypertension:***    Current medicines are reviewed at length with the patient today.   The patient {ACTIONS; HAS/DOES NOT HAVE:19233} concerns regarding his  medicines.  The following changes were made today:  {NONE DEFAULTED:18576::"none"}  Labs/ tests ordered today include: *** No orders of the defined types were placed in this encounter.    Disposition:   FU with [place] Weekly {gen number 1-89:842103} {Days to years:10300}  Signed, Brilee Port Meredith Leeds, MD  06/05/2020 8:07 AM     [facility] [address] [place] [address] [phone] (office) [phone] (fax)

## 2020-06-14 ENCOUNTER — Ambulatory Visit: Payer: BC Managed Care – PPO | Admitting: Pulmonary Disease

## 2020-06-14 ENCOUNTER — Other Ambulatory Visit: Payer: Self-pay

## 2020-06-14 ENCOUNTER — Encounter: Payer: Self-pay | Admitting: Pulmonary Disease

## 2020-06-14 VITALS — BP 156/98 | HR 63 | Temp 97.5°F | Ht 68.5 in | Wt 196.5 lb

## 2020-06-14 DIAGNOSIS — E8840 Mitochondrial metabolism disorder, unspecified: Secondary | ICD-10-CM | POA: Diagnosis not present

## 2020-06-14 DIAGNOSIS — G7100 Muscular dystrophy, unspecified: Secondary | ICD-10-CM

## 2020-06-14 DIAGNOSIS — R059 Cough, unspecified: Secondary | ICD-10-CM

## 2020-06-14 DIAGNOSIS — J4540 Moderate persistent asthma, uncomplicated: Secondary | ICD-10-CM

## 2020-06-14 DIAGNOSIS — Z8616 Personal history of COVID-19: Secondary | ICD-10-CM

## 2020-06-14 DIAGNOSIS — K449 Diaphragmatic hernia without obstruction or gangrene: Secondary | ICD-10-CM

## 2020-06-14 MED ORDER — BREZTRI AEROSPHERE 160-9-4.8 MCG/ACT IN AERO: 2.0000 | INHALATION_SPRAY | Freq: Two times a day (BID) | RESPIRATORY_TRACT | 0 refills | Status: DC

## 2020-06-14 MED ORDER — SPACER/AERO-HOLDING CHAMBERS DEVI: 0 refills | Status: AC

## 2020-06-14 NOTE — Progress Notes (Signed)
Synopsis: Referred in June 2022 for establish care new pulmonary provider, PCP, by Dettinger, Fransisca Kaufmann, MD  Subjective:   PATIENT ID: Johnny Mathews GENDER: male DOB: 1960-08-01, MRN: [id number]  Chief Complaint  Patient presents with   Follow-up    Former pt of PM---changed to [facility] for care.  He stated that since he had covid he has this cough that he cannot get rid of it.  This cough gets so bad at times that the vomits.      This is a 60 year old gentleman, past medical history of mitochondrial myopathy, muscular dystrophy, hypertension, hyperlipidemia, arthritis, asthma. . Patient initially seen by Dr. Vaughan Browner.  Initially seen in the office for diagnosis of COVID-19 in November 2020.  Has had multiple steroid tapers from primary care.  Would also seen initially by Dr. Tamala Julian.  Former smoker 10 pack years quit 1985.  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  Was continued on Dulera plus Singulair. He still has persistent cough. He has post-tussive vomiting on occasion. Usually a dry cough and then he gets strangled. Still has days where he has trouble breahting but that's better now. 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  Overall patient has no other significant respiratory complaints he is able to complete most of his activities of daily living.  He was diagnosed with mitochondrial myopathy and muscular dystrophy at [facility].  He states that he was following there for some time until the doctor passed away.  He has not followed up with neurology in several years.  He states he had a EMG and a muscle biopsy that was sent to [facility] and then referred to [facility] for confirmation.  I did recommend that he have follow-up with neurology here.   Past Medical History:  Diagnosis Date   Allergy    Alpha galactosidase deficiency    Anemia    past hx    Arthritis    Asthma    Blood transfusion without reported diagnosis    BPH (benign prostatic hypertrophy)    Cataract    removed left eye     Complication of anesthesia    Diabetes mellitus without complication (HCC)    GERD (gastroesophageal reflux disease)    HOH (hard of hearing)    Hyperlipidemia    Hypertension    Irregular heart beat    Muscular dystrophy ([place])    PONV (postoperative nausea and vomiting)    Wears hearing aid in both ears      Family History  Problem Relation Age of Onset   Breast cancer Mother    Heart disease Father    Breast cancer Sister    Colon cancer Neg Hx    Colon polyps Neg Hx    Esophageal cancer Neg Hx    Rectal cancer Neg Hx    Stomach cancer Neg Hx      Past Surgical History:  Procedure Laterality Date   CATARACT EXTRACTION Left    COLONOSCOPY     FEMUR FRACTURE SURGERY     KIDNEY STONE SURGERY     x6   KNEE SURGERY Right    x2   MASS EXCISION Right 08/02/2018   Procedure: EXCISION RIGHT LONG FINGER MASS, DEBRIDEMENT PROXIMAL INTERPHALANGEAL JOINT WITH ROTATION FLAP;  Surgeon: Leanora Cover, MD;  Location: [place];  Service: Orthopedics;  Laterality: Right;   NISSEN FUNDOPLICATION     [facility]    SHOULDER OPEN ROTATOR CUFF REPAIR  Social History   Socioeconomic History   Marital status: Married    Spouse name: Not on file   Number of children: Not on file   Years of education: Not on file   Highest education level: Not on file  Occupational History   Occupation: Physiological scientist  Tobacco Use   Smoking status: Former    Packs/day: 1.00    Years: 10.00    Pack years: 10.00    Types: Cigarettes    Quit date: 01/06/1994    Years since quitting: 26.4   Smokeless tobacco: Never  Vaping Use   Vaping Use: Never used  Substance and Sexual Activity   Alcohol use: Yes    Alcohol/week: 0.0 standard drinks    Comment: social   Drug use: No   Sexual activity: Not on file  Other Topics Concern   Not on file  Social History Narrative   Not on file   Social Determinants of Health   Financial Resource Strain: Not on file  Food Insecurity: Not  on file  Transportation Needs: Not on file  Physical Activity: Not on file  Stress: Not on file  Social Connections: Not on file  Intimate Partner Violence: Not on file     Allergies  Allergen Reactions   Eggs Or Egg-Derived Products Swelling    Throat- 03-31-2019 pt states can eat cakes and Pie with no issues  Throat- 03-31-2019 pt states can eat cakes and Pie with no issues    Galactose     Alpha- Gal    Septra [Sulfamethoxazole-Trimethoprim] Swelling    throat   Sulfonamide Derivatives     REACTION: hives     Outpatient Medications Prior to Visit  Medication Sig Dispense Refill   albuterol (PROVENTIL) (2.5 MG/3ML) 0.083% nebulizer solution NEBULIZE 1 VIAL EVERY 6 HOURS AS NEEDED FOR WHEEZING & SHORTNESS OF BREATH 150 mL 1   albuterol (VENTOLIN HFA) 108 (90 Base) MCG/ACT inhaler Inhale 2 puffs into the lungs every 4 (four) hours as needed. (Needs to be seen before next refill) 18 g 11   amitriptyline (ELAVIL) 50 MG tablet Take 1 tablet (50 mg total) by mouth at bedtime. 90 tablet 3   azelastine (ASTELIN) 0.1 % nasal spray USE 1 SPRAY IN EACH NOSTRIL TWICE DAILY AS NEEDED 30 mL 11   benzonatate (TESSALON) 100 MG capsule Take 1 capsule (100 mg total) by mouth 2 (two) times daily as needed for cough. 20 capsule 0   Blood Glucose Monitoring Suppl (CONTOUR BLOOD GLUCOSE SYSTEM) w/Device KIT Test blood sugars four times daily 200 each 5   Dulaglutide (TRULICITY) 5.46 TK/3.5WS SOPN Inject 0.75 mg into the skin once a week. 2 mL 3   EPINEPHrine (EPI-PEN) 0.3 mg/0.3 mL DEVI Inject 0.3 mg into the skin as needed.      esomeprazole (NEXIUM) 40 MG capsule Take 1 capsule (40 mg total) by mouth daily at 12 noon. 90 capsule 3   famotidine (PEPCID) 20 MG tablet Take 1 tablet (20 mg total) by mouth 2 (two) times daily. 180 tablet 3   ferrous sulfate 325 (65 FE) MG tablet Take 325 mg by mouth daily with breakfast.     finasteride (PROSCAR) 5 MG tablet TAKE ONE (1) TABLET EACH DAY 90 tablet 3    fluticasone (FLONASE) 50 MCG/ACT nasal spray Place 1-2 sprays into both nostrils daily. 48 g 11   furosemide (LASIX) 20 MG tablet Take 1 tablet (20 mg total) by mouth daily as needed. 90 tablet 3  glucose blood (CONTOUR NEXT TEST) test strip Test blood sugars four times daily 200 each 5   losartan (COZAAR) 50 MG tablet Take 1 tablet (50 mg total) by mouth daily. 90 tablet 3   Meclizine HCl 25 MG CHEW TAKE ONE TABLET DAILY AT BEDTIME 90 tablet 0   meloxicam (MOBIC) 7.5 MG tablet TAKE ONE (1) TABLET EACH DAY 90 tablet 3   metoprolol succinate (TOPROL-XL) 25 MG 24 hr tablet Take 25 mg by mouth daily.     mometasone-formoterol (DULERA) 100-5 MCG/ACT AERO Inhale 2 puffs into the lungs in the morning and at bedtime. 13 g 3   montelukast (SINGULAIR) 10 MG tablet TAKE ONE TABLET DAILY AT BEDTIME 90 tablet 1   pravastatin (PRAVACHOL) 80 MG tablet TAKE ONE (1) TABLET EACH DAY 90 tablet 3   sildenafil (REVATIO) 20 MG tablet TAKE 1 TO 3 TABLETS DAILY AS NEEDED 30 tablet 3   tamsulosin (FLOMAX) 0.4 MG CAPS capsule TAKE 1 CAPSULE DAILY AFTER SUPPER 90 capsule 3   Testosterone 1.62 % GEL PLACE 2 PUMPS ON 1 SHOULDER & PLACE 1 PUMP ON THE OTHER SHOULDER DAILY(ALTERNATE SHOULDERS EVERY OTHER DAY) 75 g 1   No facility-administered medications prior to visit.    Review of Systems  Constitutional:  Negative for chills, fever, malaise/fatigue and weight loss.  HENT:  Negative for hearing loss, sore throat and tinnitus.   Eyes:  Negative for blurred vision and double vision.  Respiratory:  Positive for cough and sputum production. Negative for hemoptysis, shortness of breath, wheezing and stridor.   Cardiovascular:  Negative for chest pain, palpitations, orthopnea, leg swelling and PND.  Gastrointestinal:  Negative for abdominal pain, constipation, diarrhea, heartburn, nausea and vomiting.  Genitourinary:  Negative for dysuria, hematuria and urgency.  Musculoskeletal:  Negative for joint pain and myalgias.   Skin:  Negative for itching and rash.  Neurological:  Negative for dizziness, tingling, weakness and headaches.  Endo/Heme/Allergies:  Negative for environmental allergies. Does not bruise/bleed easily.  Psychiatric/Behavioral:  Negative for depression. The patient is not nervous/anxious and does not have insomnia.   All other systems reviewed and are negative.   Objective:  Physical Exam Vitals reviewed.  Constitutional:      General: He is not in acute distress.    Appearance: He is well-developed.  HENT:     Head: Normocephalic and atraumatic.  Eyes:     General: No scleral icterus.    Conjunctiva/sclera: Conjunctivae normal.     Pupils: Pupils are equal, round, and reactive to light.  Neck:     Vascular: No JVD.     Trachea: No tracheal deviation.  Cardiovascular:     Rate and Rhythm: Normal rate and regular rhythm.     Heart sounds: Normal heart sounds. No murmur heard. Pulmonary:     Effort: Pulmonary effort is normal. No tachypnea, accessory muscle usage or respiratory distress.     Breath sounds: No stridor. No wheezing, rhonchi or rales.     Comments: Bilateral bronchial breath sounds Abdominal:     General: Bowel sounds are normal. There is no distension.     Palpations: Abdomen is soft.     Tenderness: There is no abdominal tenderness.  Musculoskeletal:        General: No tenderness.     Cervical back: Neck supple.  Lymphadenopathy:     Cervical: No cervical adenopathy.  Skin:    General: Skin is warm and dry.     Capillary Refill: Capillary refill takes  less than 2 seconds.     Findings: No rash.  Neurological:     Mental Status: He is alert and oriented to person, place, and time.  Psychiatric:        Behavior: Behavior normal.     Vitals:   06/14/20 0958  BP: (!) 156/98  Pulse: 63  Temp: (!) 97.5 F (36.4 C)  TempSrc: Tympanic  SpO2: 97%  Weight: 196 lb 8 oz (89.1 kg)  Height: 5' 8.5" (1.74 m)   97% on RA BMI Readings from Last 3  Encounters:  06/14/20 29.44 kg/m  02/23/20 29.01 kg/m  01/20/20 30.27 kg/m   Wt Readings from Last 3 Encounters:  06/14/20 196 lb 8 oz (89.1 kg)  02/23/20 193 lb 9.6 oz (87.8 kg)  01/20/20 202 lb (91.6 kg)     CBC    Component Value Date/Time   WBC 6.3 11/09/2019 1008   WBC 5.3 07/20/2013 1007   WBC 6.1 03/30/2009 1035   RBC 4.37 11/09/2019 1008   RBC 4.5 (A) 07/20/2013 1007   RBC 4.95 03/30/2009 1035   HGB 13.6 11/09/2019 1008   HCT 40.7 11/09/2019 1008   PLT 242 11/09/2019 1008   MCV 93 11/09/2019 1008   MCH 31.1 11/09/2019 1008   MCH 29.6 07/20/2013 1007   MCHC 33.4 11/09/2019 1008   MCHC 31.7 (A) 07/20/2013 1007   MCHC 34.1 03/30/2009 1035   RDW 12.3 11/09/2019 1008   LYMPHSABS 1.6 11/09/2019 1008   MONOABS 0.5 03/30/2009 1035   EOSABS 0.3 11/09/2019 1008   BASOSABS 0.0 11/09/2019 1008     Chest Imaging: Resolution CT scan of the chest March 2022: Bilateral parenchymal opacities, history of COVID-19.  Suspect these areas of groundglass opacities are related to his COVID-19. They overall seem to be improving.  He does have a moderate hiatal hernia. The patient's images have been independently reviewed by me.    Pulmonary Functions Testing Results: PFT Results Latest Ref Rng & Units 02/27/2014  FVC-Pre L 3.58  FVC-Predicted Pre % 74  FVC-Post L 3.64  FVC-Predicted Post % 75  Pre FEV1/FVC % % 89  Post FEV1/FCV % % 91  FEV1-Pre L 3.20  FEV1-Predicted Pre % 86  FEV1-Post L 3.31  DLCO uncorrected ml/min/mmHg 25.90  DLCO UNC% % 81  DLVA Predicted % 111  TLC L 5.28  TLC % Predicted % 76  RV % Predicted % 77    FeNO:   Pathology:   Echocardiogram:   Heart Catheterization:     Assessment & Plan:     ICD-10-CM   1. Mitochondrial disease ([place])  E88.40 Ambulatory referral to Neurology    2. Muscular dystrophy ([place])  G71.00 Ambulatory referral to Neurology    Spacer/Aero-Holding Chambers DEVI    3. Cough  R05.9 Spacer/Aero-Holding Chambers DEVI     4. Hiatal hernia  K44.9 Ambulatory referral to Cardiothoracic Surgery    5. History of COVID-19  Z86.16     6. Moderate persistent asthma without complication  E72.09 Ambulatory referral to Cardiothoracic Surgery    Spacer/Aero-Holding [facility]      Discussion:  This is a 60 year old gentleman with complaints of cough and gastroesophageal reflux, bloating symptoms at time.  He had a history of COVID-19.  His cough is not really resolved since then.  Its predominantly episodic.  It may or may not be associated with food intake.  His dyspnea on exertion that he had at the time of COVID has resolved.  Plan: I  think he has multifactorial reason for cough. I think it is reasonable to have thoracic surgery evaluate his moderate hiatal hernia for repair. I am not sure that his COVID-19 history is playing a role in his cough at this time. As for his asthma we will have him stop his Dulera, start Breztri with a spacer, new prescription and co-pay card.  As for his mitochondrial disease/muscular dystrophy I do think he needs to establish care with a neurologist.  He does not want to return to [facility].  I have placed a referral to meet Dr. Carles Collet.    Current Outpatient Medications:    albuterol (PROVENTIL) (2.5 MG/3ML) 0.083% nebulizer solution, NEBULIZE 1 VIAL EVERY 6 HOURS AS NEEDED FOR WHEEZING & SHORTNESS OF BREATH, Disp: 150 mL, Rfl: 1   albuterol (VENTOLIN HFA) 108 (90 Base) MCG/ACT inhaler, Inhale 2 puffs into the lungs every 4 (four) hours as needed. (Needs to be seen before next refill), Disp: 18 g, Rfl: 11   amitriptyline (ELAVIL) 50 MG tablet, Take 1 tablet (50 mg total) by mouth at bedtime., Disp: 90 tablet, Rfl: 3   azelastine (ASTELIN) 0.1 % nasal spray, USE 1 SPRAY IN EACH NOSTRIL TWICE DAILY AS NEEDED, Disp: 30 mL, Rfl: 11   benzonatate (TESSALON) 100 MG capsule, Take 1 capsule (100 mg total) by mouth 2 (two) times daily as needed for cough., Disp: 20 capsule, Rfl: 0    Blood Glucose Monitoring Suppl (CONTOUR BLOOD GLUCOSE SYSTEM) w/Device KIT, Test blood sugars four times daily, Disp: 200 each, Rfl: 5   Budeson-Glycopyrrol-Formoterol (BREZTRI AEROSPHERE) 160-9-4.8 MCG/ACT AERO, Inhale 2 puffs into the lungs in the morning and at bedtime., Disp: 9.4 g, Rfl: 0   Dulaglutide (TRULICITY) 1.47 WG/9.5AO SOPN, Inject 0.75 mg into the skin once a week., Disp: 2 mL, Rfl: 3   EPINEPHrine (EPI-PEN) 0.3 mg/0.3 mL DEVI, Inject 0.3 mg into the skin as needed. , Disp: , Rfl:    esomeprazole (NEXIUM) 40 MG capsule, Take 1 capsule (40 mg total) by mouth daily at 12 noon., Disp: 90 capsule, Rfl: 3   famotidine (PEPCID) 20 MG tablet, Take 1 tablet (20 mg total) by mouth 2 (two) times daily., Disp: 180 tablet, Rfl: 3   ferrous sulfate 325 (65 FE) MG tablet, Take 325 mg by mouth daily with breakfast., Disp: , Rfl:    finasteride (PROSCAR) 5 MG tablet, TAKE ONE (1) TABLET EACH DAY, Disp: 90 tablet, Rfl: 3   fluticasone (FLONASE) 50 MCG/ACT nasal spray, Place 1-2 sprays into both nostrils daily., Disp: 48 g, Rfl: 11   furosemide (LASIX) 20 MG tablet, Take 1 tablet (20 mg total) by mouth daily as needed., Disp: 90 tablet, Rfl: 3   glucose blood (CONTOUR NEXT TEST) test strip, Test blood sugars four times daily, Disp: 200 each, Rfl: 5   losartan (COZAAR) 50 MG tablet, Take 1 tablet (50 mg total) by mouth daily., Disp: 90 tablet, Rfl: 3   Meclizine HCl 25 MG CHEW, TAKE ONE TABLET DAILY AT BEDTIME, Disp: 90 tablet, Rfl: 0   meloxicam (MOBIC) 7.5 MG tablet, TAKE ONE (1) TABLET EACH DAY, Disp: 90 tablet, Rfl: 3   metoprolol succinate (TOPROL-XL) 25 MG 24 hr tablet, Take 25 mg by mouth daily., Disp: , Rfl:    mometasone-formoterol (DULERA) 100-5 MCG/ACT AERO, Inhale 2 puffs into the lungs in the morning and at bedtime., Disp: 13 g, Rfl: 3   montelukast (SINGULAIR) 10 MG tablet, TAKE ONE TABLET DAILY AT BEDTIME, Disp: 90 tablet,  Rfl: 1   pravastatin (PRAVACHOL) 80 MG tablet, TAKE ONE (1) TABLET  EACH DAY, Disp: 90 tablet, Rfl: 3   sildenafil (REVATIO) 20 MG tablet, TAKE 1 TO 3 TABLETS DAILY AS NEEDED, Disp: 30 tablet, Rfl: 3   Spacer/Aero-Holding Chambers DEVI, Use with inhaler daily, Disp: 1 each, Rfl: 0   tamsulosin (FLOMAX) 0.4 MG CAPS capsule, TAKE 1 CAPSULE DAILY AFTER SUPPER, Disp: 90 capsule, Rfl: 3  I spent 62 minutes dedicated to the care of this patient on the date of this encounter to include pre-visit review of records, face-to-face time with the patient discussing conditions above, post visit ordering of testing, clinical documentation with the electronic health record, making appropriate referrals as documented, and communicating necessary findings to members of the patients care team.   Garner Nash, DO [facility] 06/14/2020 11:10 AM

## 2020-06-14 NOTE — Patient Instructions (Addendum)
Thank you for visiting Dr. Valeta Harms at [facility]. Today we recommend the following:  Orders Placed This Encounter  Procedures   Ambulatory referral to Neurology   Breztri samples Co-pay card Spacer  New prescription   Return in about 2 months (around 08/14/2020) for with APP or Dr. Valeta Harms.    Please do your part to reduce the spread of COVID-19.

## 2020-06-28 ENCOUNTER — Other Ambulatory Visit: Payer: Self-pay | Admitting: *Deleted

## 2020-07-02 NOTE — Progress Notes (Deleted)
NEUROLOGY CONSULTATION NOTE  Johnny Mathews MRN: [id number] DOB: 1960-08-08  Referring provider: June Leap, DO Primary care provider: Caryl Pina, MD  Reason for consult:  mitochondrial myopathy  Assessment/Plan:   ***   Subjective:  Johnny Mathews is a 60 year old ***-handed male who presents for mitochondrial myopathy.  History supplemented by prior neurologist's notes.  He began experiencing pain in the back and lower extremities, muscle cramps and progressive fatigue in the early 1990s.  Prior CK levels have been mildly elevated 300-400.  He underwent muscle biopsy in 1992 which showed ragged red fibers believed to be consistent with mitochondrial myopathy.  He did not have enzymatic testing at that time.  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    He has been followed by cardiology.  ***.  He has bilateral sensorineural hearing loss.  His primary symptoms are muscle cramps and pain.  Prior treatment included magnesium, tonic water, apple cider vinegar, B vitamins.  He is followed by cardiology.  Echocardiograms have been normal thus far.  No abnormalities.  His neurologist was Dr. 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  He is followed by pulmonology for post-COVID asthma.     PAST MEDICAL HISTORY: Past Medical History:  Diagnosis Date   Allergy    Alpha galactosidase deficiency    Anemia    past hx    Arthritis    Asthma    Blood transfusion without reported diagnosis    BPH (benign prostatic hypertrophy)    Cataract    removed left eye    Complication of anesthesia    Diabetes mellitus without complication (HCC)    GERD (gastroesophageal reflux disease)    HOH (hard of hearing)    Hyperlipidemia    Hypertension    Irregular heart beat    Muscular dystrophy ([place])    PONV (postoperative nausea and  vomiting)    Wears hearing aid in both ears     PAST SURGICAL HISTORY: Past Surgical History:  Procedure Laterality Date   CATARACT EXTRACTION Left    COLONOSCOPY     FEMUR FRACTURE SURGERY     KIDNEY STONE SURGERY     x6   KNEE SURGERY Right    x2   MASS EXCISION Right 08/02/2018   Procedure: EXCISION RIGHT LONG FINGER MASS, DEBRIDEMENT PROXIMAL INTERPHALANGEAL JOINT WITH ROTATION FLAP;  Surgeon: Leanora Cover, MD;  Location: [place];  Service: Orthopedics;  Laterality: Right;   NISSEN FUNDOPLICATION     [facility]    SHOULDER OPEN ROTATOR CUFF REPAIR      MEDICATIONS: Current Outpatient Medications on File Prior to Visit  Medication Sig Dispense Refill   albuterol (PROVENTIL) (2.5 MG/3ML) 0.083% nebulizer solution NEBULIZE 1 VIAL EVERY 6 HOURS AS NEEDED FOR WHEEZING & SHORTNESS OF BREATH 150 mL 1   albuterol (VENTOLIN HFA) 108 (90 Base) MCG/ACT inhaler Inhale 2 puffs into the lungs every 4 (four) hours as needed. (Needs to be seen before next refill) 18 g 11   amitriptyline (ELAVIL) 50 MG tablet Take 1 tablet (50 mg total) by mouth at bedtime. 90 tablet 3   azelastine (ASTELIN) 0.1 % nasal spray USE 1 SPRAY IN EACH NOSTRIL TWICE DAILY AS NEEDED 30 mL 11   benzonatate (TESSALON) 100 MG capsule Take 1 capsule (100 mg total) by mouth 2 (two) times daily as needed  for cough. 20 capsule 0   Blood Glucose Monitoring Suppl (CONTOUR BLOOD GLUCOSE SYSTEM) w/Device KIT Test blood sugars four times daily 200 each 5   Budeson-Glycopyrrol-Formoterol (BREZTRI AEROSPHERE) 160-9-4.8 MCG/ACT AERO Inhale 2 puffs into the lungs in the morning and at bedtime. 9.4 g 0   Dulaglutide (TRULICITY) 1.61 WR/6.0AV SOPN Inject 0.75 mg into the skin once a week. 2 mL 3   EPINEPHrine (EPI-PEN) 0.3 mg/0.3 mL DEVI Inject 0.3 mg into the skin as needed.      esomeprazole (NEXIUM) 40 MG capsule Take 1 capsule (40 mg total) by mouth daily at 12 noon. 90 capsule 3   famotidine (PEPCID) 20 MG tablet Take  1 tablet (20 mg total) by mouth 2 (two) times daily. 180 tablet 3   ferrous sulfate 325 (65 FE) MG tablet Take 325 mg by mouth daily with breakfast.     finasteride (PROSCAR) 5 MG tablet TAKE ONE (1) TABLET EACH DAY 90 tablet 3   fluticasone (FLONASE) 50 MCG/ACT nasal spray Place 1-2 sprays into both nostrils daily. 48 g 11   furosemide (LASIX) 20 MG tablet Take 1 tablet (20 mg total) by mouth daily as needed. 90 tablet 3   glucose blood (CONTOUR NEXT TEST) test strip Test blood sugars four times daily 200 each 5   losartan (COZAAR) 50 MG tablet Take 1 tablet (50 mg total) by mouth daily. 90 tablet 3   Meclizine HCl 25 MG CHEW TAKE ONE TABLET DAILY AT BEDTIME 90 tablet 0   meloxicam (MOBIC) 7.5 MG tablet TAKE ONE (1) TABLET EACH DAY 90 tablet 3   metoprolol succinate (TOPROL-XL) 25 MG 24 hr tablet Take 25 mg by mouth daily.     mometasone-formoterol (DULERA) 100-5 MCG/ACT AERO Inhale 2 puffs into the lungs in the morning and at bedtime. 13 g 3   montelukast (SINGULAIR) 10 MG tablet TAKE ONE TABLET DAILY AT BEDTIME 90 tablet 1   pravastatin (PRAVACHOL) 80 MG tablet TAKE ONE (1) TABLET EACH DAY 90 tablet 3   sildenafil (REVATIO) 20 MG tablet TAKE 1 TO 3 TABLETS DAILY AS NEEDED 30 tablet 3   Spacer/Aero-Holding Chambers DEVI Use with inhaler daily 1 each 0   tamsulosin (FLOMAX) 0.4 MG CAPS capsule TAKE 1 CAPSULE DAILY AFTER SUPPER 90 capsule 3   No current facility-administered medications on file prior to visit.    ALLERGIES: Allergies  Allergen Reactions   Eggs Or Egg-Derived Products Swelling    Throat- 03-31-2019 pt states can eat cakes and Pie with no issues  Throat- 03-31-2019 pt states can eat cakes and Pie with no issues    Galactose     Alpha- Gal    Septra [Sulfamethoxazole-Trimethoprim] Swelling    throat   Sulfonamide Derivatives     REACTION: hives    FAMILY HISTORY: Family History  Problem Relation Age of Onset   Breast cancer Mother    Heart disease Father    Breast  cancer Sister    Colon cancer Neg Hx    Colon polyps Neg Hx    Esophageal cancer Neg Hx    Rectal cancer Neg Hx    Stomach cancer Neg Hx     Objective:  *** General: No acute distress.  Patient appears well-groomed.   Head:  Normocephalic/atraumatic Eyes:  fundi examined but not visualized Neck: supple, no paraspinal tenderness, full range of motion Back: No paraspinal tenderness Heart: regular rate and rhythm Lungs: Clear to auscultation bilaterally. Vascular: No carotid bruits. Neurological Exam:  Mental status: alert and oriented to person, place, and time, recent and remote memory intact, fund of knowledge intact, attention and concentration intact, speech fluent and not dysarthric, language intact. Cranial nerves: CN I: not tested CN II: pupils equal, round and reactive to light, visual fields intact CN III, IV, VI:  full range of motion, no nystagmus, no ptosis CN V: facial sensation intact. CN VII: upper and lower face symmetric CN VIII: hearing intact CN IX, X: gag intact, uvula midline CN XI: sternocleidomastoid and trapezius muscles intact CN XII: tongue midline Bulk & Tone: normal, no fasciculations. Motor:  muscle strength 5/5 throughout Sensation:  Pinprick, temperature and vibratory sensation intact. Deep Tendon Reflexes:  2+ throughout,  toes downgoing.   Finger to nose testing:  Without dysmetria.   Heel to shin:  Without dysmetria.   Gait:  Normal station and stride.  Romberg negative.    Thank you for allowing me to take part in the care of this patient.  Metta Clines, DO  CC:  Caryl Pina, MD  June Leap, DO

## 2020-07-03 ENCOUNTER — Ambulatory Visit: Payer: BC Managed Care – PPO | Admitting: Neurology

## 2020-07-09 ENCOUNTER — Other Ambulatory Visit: Payer: Self-pay | Admitting: Family Medicine

## 2020-07-09 DIAGNOSIS — R0602 Shortness of breath: Secondary | ICD-10-CM

## 2020-07-09 DIAGNOSIS — U071 COVID-19: Secondary | ICD-10-CM

## 2020-07-10 ENCOUNTER — Telehealth: Payer: Self-pay | Admitting: Pulmonary Disease

## 2020-07-10 DIAGNOSIS — K449 Diaphragmatic hernia without obstruction or gangrene: Secondary | ICD-10-CM

## 2020-07-10 MED ORDER — BREZTRI AEROSPHERE 160-9-4.8 MCG/ACT IN AERO: 2.0000 | INHALATION_SPRAY | Freq: Two times a day (BID) | RESPIRATORY_TRACT | 0 refills | Status: DC

## 2020-07-10 MED ORDER — BREZTRI AEROSPHERE 160-9-4.8 MCG/ACT IN AERO: 2.0000 | INHALATION_SPRAY | Freq: Two times a day (BID) | RESPIRATORY_TRACT | 6 refills | Status: DC

## 2020-07-10 NOTE — Telephone Encounter (Signed)
-----   Message from Ned Clines sent at 07/10/2020  4:53 PM EDT ----- Regarding: RE: Referral This patient needs a updated scan prior to [place] seeing him for appointment (CT Chest) can you help with that? Thanks  cindy ----- Message ----- From: Harland German Sent: 06/14/2020  10:47 AM EDT To: Ned Clines Subject: Referral                                        Referral placed by Dr Valeta Harms. Thanks Chantel

## 2020-07-10 NOTE — Telephone Encounter (Signed)
Pt sent a my chart message and this has been sent to [facility].  Will close this message since the other message is open.

## 2020-07-10 NOTE — Telephone Encounter (Signed)
Hello Dr. Valeta Harms, please advise on mychart message. I have sent in the refill on the [facility]. Thank you!   Dr. Kipp Brood office called me late Friday, saying they needed a up to date CT Scan, before they could see me. They gave me an appointment of July 8th.  I don't know what I need to do at this point      The the inhaler you gave me seems to be working a lot better, so I need a prescription called in to my drug store.

## 2020-07-10 NOTE — Telephone Encounter (Signed)
Called and spoke with pharmacy to let them know that RX has been resent. They confirmed they received it. Nothing further needed at this time

## 2020-07-12 MED ORDER — BREZTRI AEROSPHERE 160-9-4.8 MCG/ACT IN AERO: 2.0000 | INHALATION_SPRAY | Freq: Two times a day (BID) | RESPIRATORY_TRACT | 6 refills | Status: DC

## 2020-07-12 NOTE — Telephone Encounter (Signed)
Received the following message from patient:   "   Dr. Kipp Brood canceled my appointment this morning  for my appointment  tomorrow. He wants an updated CT scan, and want see me till he gets one. Please let me know what I need to do. The last CT scan I had, I went to [facility]."  Dr. Valeta Harms, can you please advise? Thanks!

## 2020-07-12 NOTE — Addendum Note (Signed)
Addended by: Elie Confer on: 07/12/2020 05:45 PM   Modules accepted: Orders

## 2020-07-12 NOTE — Addendum Note (Signed)
Addended by: Elie Confer on: 07/12/2020 05:02 PM   Modules accepted: Orders

## 2020-07-12 NOTE — Telephone Encounter (Signed)
Refill of the breztri has been sent to the pharmacy

## 2020-07-13 ENCOUNTER — Encounter: Payer: BC Managed Care – PPO | Admitting: Thoracic Surgery (Cardiothoracic Vascular Surgery)

## 2020-07-16 ENCOUNTER — Encounter: Payer: Self-pay | Admitting: Neurology

## 2020-07-16 ENCOUNTER — Ambulatory Visit: Payer: BC Managed Care – PPO | Admitting: Neurology

## 2020-07-16 ENCOUNTER — Other Ambulatory Visit: Payer: Self-pay

## 2020-07-16 VITALS — BP 121/73 | HR 88 | Ht 68.5 in | Wt 193.0 lb

## 2020-07-16 DIAGNOSIS — M5412 Radiculopathy, cervical region: Secondary | ICD-10-CM

## 2020-07-16 DIAGNOSIS — G713 Mitochondrial myopathy, not elsewhere classified: Secondary | ICD-10-CM | POA: Diagnosis not present

## 2020-07-16 NOTE — Progress Notes (Signed)
[place] [facility] Clinic Note - Initial Visit   Date: 07/16/20  Johnny Mathews MRN: [id number] DOB: Apr 01, 1960   Dear Dr. Warrick Parisian:  Thank you for your kind referral of Johnny Mathews for consultation of mitochrondial myopathy. Although his history is well known to you, please allow [place] to reiterate it for the purpose of our medical record. The patient was accompanied to the clinic by self.    History of Present Illness: Johnny Mathews is a 60 y.o. 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  He has a long history of muscle cramps and diagnosed with mitochondial myopathy in 1992 at [facility] by muscle biopsy.  Enzymatic testing was not done.  [facility] Genetic testing in 2014 showed PLEX and TTN variants of unknown significance, concluded to be benign. CK was mildly elevated 300-400.  He has been managed most recently by Dr. Tillman Abide at the [facility], last seen in 2018.  He has tried a number of medications and supplements for cramps, and currently takes amitriptyline 7m at bedtime.  He is unsure how much benefit he gets from this, because he continues to have painful cramps, especially in the feet. [facility] but [facility] too [facility]. He often stretches/walks to get relief.  He also has hyperkinetic movements of the face and limbs.  Over the past year, he is having increased falls and leg weakness.  He walks unassisted and works full-time. He also complains of numbness in his upper back.  MRI cervical spine from May 2019 shows moderate spinal stenosis at C5-6 and foraminal stenosis at this level.  He has not done PT.  He denies speech/swallow problems.    Out-side paper records, electronic medical record, and images have been reviewed where available and summarized as: MRI cervical spine wo contrast 06/04/2017: 1. Mildly progressive cervical disc  degeneration, most notable at C5-6 where there is mild-to-moderate spinal stenosis and mild right and moderate to severe left neural foraminal stenosis. 2. Mild spinal stenosis and mild-to-moderate right and mild left neural foraminal stenosis at C6-7.    Lab Results  Component Value Date   HGBA1C 4.9 02/23/2020   Lab Results  Component Value Date   VITAMINB12 650 06/03/2017   Lab Results  Component Value Date   TSH 2.040 06/03/2017   Lab Results  Component Value Date   ESRSEDRATE 6 10/02/2015    Past Medical History:  Diagnosis Date   Allergy    Alpha galactosidase deficiency    Anemia    past hx    Arthritis    Asthma    Blood transfusion without reported diagnosis    BPH (benign prostatic hypertrophy)    Cataract    removed left eye    Complication of anesthesia    Diabetes mellitus without complication (HCC)    GERD (gastroesophageal reflux disease)    HOH (hard of hearing)    Hyperlipidemia    Hypertension    Irregular heart beat    [place]    PONV (postoperative nausea and vomiting)    Wears hearing aid in both ears     Past Surgical History:  Procedure Laterality Date   CATARACT EXTRACTION Left    COLONOSCOPY     FEMUR FRACTURE SURGERY     KIDNEY STONE SURGERY     x6   KNEE SURGERY Right    x2   MASS EXCISION Right  08/02/2018   Procedure: EXCISION RIGHT LONG FINGER MASS, DEBRIDEMENT PROXIMAL INTERPHALANGEAL JOINT WITH ROTATION FLAP;  Surgeon: Leanora Cover, MD;  Location: [place];  Service: Orthopedics;  Laterality: Right;   NISSEN FUNDOPLICATION     [facility]    SHOULDER OPEN ROTATOR CUFF REPAIR       Medications:  Outpatient Encounter Medications as of 07/16/2020  Medication Sig   albuterol (PROVENTIL) (2.5 MG/3ML) 0.083% nebulizer solution NEBULIZE 1 VIAL EVERY 6 HOURS AS NEEDED FOR WHEEZING & SHORTNESS OF BREATH   albuterol (VENTOLIN HFA) 108 (90 Base) MCG/ACT inhaler Inhale 2 puffs into the lungs every 4  (four) hours as needed. (Needs to be seen before next refill)   amitriptyline (ELAVIL) 50 MG tablet Take 1 tablet (50 mg total) by mouth at bedtime.   azelastine (ASTELIN) 0.1 % nasal spray USE 1 SPRAY IN EACH NOSTRIL TWICE DAILY AS NEEDED   benzonatate (TESSALON) 100 MG capsule Take 1 capsule (100 mg total) by mouth 2 (two) times daily as needed for cough.   Blood Glucose Monitoring Suppl (CONTOUR BLOOD GLUCOSE SYSTEM) w/Device KIT Test blood sugars four times daily   Budeson-Glycopyrrol-Formoterol (BREZTRI AEROSPHERE) 160-9-4.8 MCG/ACT AERO Inhale 2 puffs into the lungs in the morning and at bedtime.   Dulaglutide (TRULICITY) 8.76 OT/1.5BW SOPN Inject 0.75 mg into the skin once a week.   EPINEPHrine (EPI-PEN) 0.3 mg/0.3 mL DEVI Inject 0.3 mg into the skin as needed.    esomeprazole (NEXIUM) 40 MG capsule Take 1 capsule (40 mg total) by mouth daily at 12 noon.   famotidine (PEPCID) 20 MG tablet Take 1 tablet (20 mg total) by mouth 2 (two) times daily.   ferrous sulfate 325 (65 FE) MG tablet Take 325 mg by mouth daily with breakfast.   finasteride (PROSCAR) 5 MG tablet TAKE ONE (1) TABLET EACH DAY   fluticasone (FLONASE) 50 MCG/ACT nasal spray Place 1-2 sprays into both nostrils daily.   furosemide (LASIX) 20 MG tablet Take 1 tablet (20 mg total) by mouth daily as needed.   glucose blood (CONTOUR NEXT TEST) test strip Test blood sugars four times daily   losartan (COZAAR) 50 MG tablet Take 1 tablet (50 mg total) by mouth daily.   Meclizine HCl 25 MG CHEW TAKE ONE TABLET DAILY AT BEDTIME   meloxicam (MOBIC) 7.5 MG tablet TAKE ONE (1) TABLET EACH DAY   metoprolol succinate (TOPROL-XL) 25 MG 24 hr tablet Take 25 mg by mouth daily.   montelukast (SINGULAIR) 10 MG tablet TAKE ONE TABLET DAILY AT BEDTIME   pravastatin (PRAVACHOL) 80 MG tablet TAKE ONE (1) TABLET EACH DAY   sildenafil (REVATIO) 20 MG tablet TAKE 1 TO 3 TABLETS DAILY AS NEEDED   Spacer/Aero-Holding Chambers DEVI Use with inhaler daily    tamsulosin (FLOMAX) 0.4 MG CAPS capsule TAKE 1 CAPSULE DAILY AFTER SUPPER   mometasone-formoterol (DULERA) 100-5 MCG/ACT AERO Inhale 2 puffs into the lungs in the morning and at bedtime. (Patient not taking: Reported on 07/16/2020)   No facility-administered encounter medications on file as of 07/16/2020.    Allergies:  Allergies  Allergen Reactions   Eggs Or Egg-Derived Products Swelling    Throat- 03-31-2019 pt states can eat cakes and Pie with no issues  Throat- 03-31-2019 pt states can eat cakes and Pie with no issues    Galactose     Alpha- Gal    Septra [Sulfamethoxazole-Trimethoprim] Swelling    throat   Sulfonamide Derivatives     REACTION: hives  Family History: Family History  Problem Relation Age of Onset   Breast cancer Mother    Heart disease Father    Breast cancer Sister    Colon cancer Neg Hx    Colon polyps Neg Hx    Esophageal cancer Neg Hx    Rectal cancer Neg Hx    Stomach cancer Neg Hx     Social History: Social History   Tobacco Use   Smoking status: Former    Packs/day: 1.00    Years: 10.00    Pack years: 10.00    Types: Cigarettes    Quit date: 01/06/1994    Years since quitting: 26.5   Smokeless tobacco: Never  Vaping Use   Vaping Use: Never used  Substance Use Topics   Alcohol use: Yes    Alcohol/week: 0.0 standard drinks    Comment: social   Drug use: No   Social History   Social History Narrative   Not on file    Vital Signs:  BP 121/73   Pulse 88   Ht 5' 8.5" (1.74 m)   Wt 193 lb (87.5 kg)   SpO2 98%   BMI 28.92 kg/m    Neurological Exam: MENTAL STATUS including orientation to time, place, person, recent and remote memory, attention span and concentration, language, and fund of knowledge is normal.  Speech is not dysarthric.  CRANIAL NERVES: II:  No visual field defects.     III-IV-VI: Pupils equal round and reactive to light.  Normal conjugate, extra-ocular eye movements in all directions of gaze.  No nystagmus.   No ptosis V:  Normal facial sensation.    VII:  Normal facial symmetry and movements.  There is prominent facial and cervical tics. VIII:  Reduced hearing bilaterally.   IX-X:  Normal palatal movement.   XI:  Normal shoulder shrug and head rotation.   XII:  Normal tongue strength and range of motion, no deviation or fasciculation.  MOTOR:  Motor impersistence of the arms and legs. Mild intrinsic hand muscle atrophy.  No fasciculations or abnormal movements.  No pronator drift.   Upper Extremity:  Right  Left  Deltoid  5/5   5/5   Biceps  5/5   5/5   Triceps  5/5   5/5   Infraspinatus 5/5  5/5  Medial pectoralis 5/5  5/5  Wrist extensors  5/5   5/5   Wrist flexors  5/5   5/5   Finger extensors  5-/5   5-/5   Finger flexors  5-/5   5-/5   Dorsal interossei  5-/5   5-/5   Abductor pollicis  5-/5   5-/5   Tone (Ashworth scale)  0  0   Lower Extremity:  Right  Left  Hip flexors  5-/5   5-/5   Hip extensors  5/5   5/5   Adductor 5/5  5/5  Abductor 5/5  5/5  Knee flexors  5/5   5/5   Knee extensors  5/5   5/5   Dorsiflexors  5/5   5/5   Plantarflexors  5/5   5/5   Toe extensors  5/5   5/5   Toe flexors  5/5   5/5   Tone (Ashworth scale)  0  0   MSRs:  Right        Left                  brachioradialis 1+  1+  biceps 1+  1+  triceps 1+  1+  patellar 1+  1+  ankle jerk 1+  1+  Hoffman no  no  plantar response down  down   SENSORY:  Normal and symmetric perception of light touch, pinprick, vibration, and proprioception.  Romberg's sign absent.   COORDINATION/GAIT: Normal finger-to- nose-finger.  Intact rapid alternating movements bilaterally.   Gait narrow based and stable.   IMPRESSION: Cervical radiculopathy - Start physical therapy 2.    Mitochondrial myopathy, cramps.  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  If I can answer any additional questions, I would be pleased to do so.    Sincerely,    Joselyne Spake K. PPosey Pronto DO

## 2020-07-16 NOTE — Patient Instructions (Signed)
Start physical therapy for leg strengthening and neck PT.  Return to clinic in 6 months

## 2020-07-19 ENCOUNTER — Ambulatory Visit: Payer: BC Managed Care – PPO | Attending: Neurology

## 2020-07-19 ENCOUNTER — Other Ambulatory Visit: Payer: Self-pay

## 2020-07-19 DIAGNOSIS — I5032 Chronic diastolic (congestive) heart failure: Secondary | ICD-10-CM | POA: Diagnosis not present

## 2020-07-19 DIAGNOSIS — M542 Cervicalgia: Secondary | ICD-10-CM | POA: Diagnosis not present

## 2020-07-19 DIAGNOSIS — I10 Essential (primary) hypertension: Secondary | ICD-10-CM | POA: Diagnosis not present

## 2020-07-19 DIAGNOSIS — I25118 Atherosclerotic heart disease of native coronary artery with other forms of angina pectoris: Secondary | ICD-10-CM | POA: Diagnosis not present

## 2020-07-19 DIAGNOSIS — R293 Abnormal posture: Secondary | ICD-10-CM | POA: Diagnosis not present

## 2020-07-19 DIAGNOSIS — M6281 Muscle weakness (generalized): Secondary | ICD-10-CM

## 2020-07-19 DIAGNOSIS — I350 Nonrheumatic aortic (valve) stenosis: Secondary | ICD-10-CM | POA: Diagnosis not present

## 2020-07-19 NOTE — Therapy (Signed)
[facility] [place], [place], [address] Phone: [phone]   Fax:  [phone]  Physical Therapy Evaluation  Patient Details  Name: Johnny Mathews MRN: [id number] Date of Birth: [birth year] Referring Provider (PT): Narda Amber, DO   Encounter Date: 07/19/2020   PT End of Session - 07/19/20 1651     Visit Number 1    Number of Visits 8    Date for PT Re-Evaluation 08/16/20    PT Start Time 6160    PT Stop Time 7371    PT Time Calculation (min) 50 min    Equipment Utilized During Treatment Other (comment)    Activity Tolerance Patient tolerated treatment well    Behavior During Therapy WFL for tasks assessed/performed             Past Medical History:  Diagnosis Date   Allergy    Alpha galactosidase deficiency    Anemia    past hx    Arthritis    Asthma    Blood transfusion without reported diagnosis    BPH (benign prostatic hypertrophy)    Cataract    removed left eye    Complication of anesthesia    Diabetes mellitus without complication ([place])    GERD (gastroesophageal reflux disease)    HOH (hard of hearing)    Hyperlipidemia    Hypertension    Irregular heart beat    Muscular dystrophy ([place])    PONV (postoperative nausea and vomiting)    Wears hearing aid in both ears     Past Surgical History:  Procedure Laterality Date   CATARACT EXTRACTION Left    COLONOSCOPY     FEMUR FRACTURE SURGERY     KIDNEY STONE SURGERY     x6   KNEE SURGERY Right    x2   MASS EXCISION Right 08/02/2018   Procedure: EXCISION RIGHT LONG FINGER MASS, DEBRIDEMENT PROXIMAL INTERPHALANGEAL JOINT WITH ROTATION FLAP;  Surgeon: Leanora Cover, MD;  Location: [place] [facility];  Service: Orthopedics;  Laterality: Right;   NISSEN FUNDOPLICATION     [facility]    SHOULDER OPEN ROTATOR CUFF REPAIR      There were no vitals filed for this visit.    Subjective Assessment - 07/19/20 1529     Subjective Patient states  that he has been trying PT before for his neck however symptoms have gotten worse. He states that PT helped in the past but symptoms have returned. He had injections before and it helped. He gets numbness and tingling in the back (upper shoulder blades ) he states that the neck being sore is excrutiating but the numbness does not bother him. 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 Those work shifts can be 9-10 hours. He does not sleep well at night. He denies bowel or bladder changes.    Pertinent History Mitochondrial Muscular Dystrophy, DM, Asthma, previous left RTC surgery    Limitations Lifting    How long can you sit comfortably? no difficulty    How long can you stand comfortably? no difficulty    How long can you walk comfortably? no difficulty    Diagnostic tests MRI 2019 : C5-C6 foraminal stenosis    Patient Stated Goals to get rid of the pain and to be able to lift things    Currently in Pain? Yes    Pain Score 6     Pain Location Back  Pain Orientation Medial;Upper    Pain Descriptors / Indicators Aching;Tightness;Pressure;Numbness    Pain Type Chronic pain    Pain Radiating Towards from neck to shoulder bladers    Pain Onset More than a month ago    Pain Frequency Constant    Aggravating Factors  "lifting just the slightest thing"    Effect of Pain on Daily Activities Difficult to work through a day                [facility] PT Assessment - 07/19/20 1520       Assessment   Medical Diagnosis Cervical Radiculopathy C5-C6    Referring Provider (PT) Narda Amber, DO    Onset Date/Surgical Date 01/20/20    Hand Dominance Right    Next MD Visit n/a    Prior Therapy Yes in 2019      Precautions   Precautions None      Restrictions   Weight Bearing Restrictions No      Balance Screen   Has the patient fallen in the past 6 months Yes    How many times? 1    Has the patient had a decrease in activity  level because of a fear of falling?  No    Is the patient reluctant to leave their home because of a fear of falling?  No      Home Ec[place] residence      Prior Function   Level of Independence Independent    Vocation Full time employment    Curator, walking      Cognition   Overall Cognitive Status Within Functional Limits for tasks assessed      Observation/Other Assessments   Observations Rouded shoulders; hyperkinetic LE and fascial mm      Sensation   Light Touch Appears Intact      Deep Tendon Reflexes   DTR Assessment Site Biceps;Brachioradialis;Triceps    Biceps DTR 2+    Brachioradialis DTR 1+    Triceps DTR 1+      ROM / Strength   AROM / PROM / Strength AROM;Strength      AROM   Overall AROM  Within functional limits for tasks performed    AROM Assessment Site Cervical;Shoulder    Right/Left Shoulder Right;Left   WFL   Cervical Flexion 60    Cervical Extension 40    Cervical - Right Side Bend WNL    Cervical - Left Side Bend WNL    Cervical - Right Rotation 67    Cervical - Left Rotation 65      Strength   Overall Strength Within functional limits for tasks performed    Strength Assessment Site Shoulder    Right/Left Shoulder Right;Left    Right Shoulder Flexion 5/5    Right Shoulder Extension 5/5    Right Shoulder ABduction 5/5    Right Shoulder Internal Rotation 5/5    Right Shoulder External Rotation 5/5    Left Shoulder Flexion 3/5    Left Shoulder Extension 4-/5    Left Shoulder ABduction 3/5    Left Shoulder Internal Rotation 4/5    Left Shoulder External Rotation 3/5      Flexibility   Soft Tissue Assessment /Muscle Length yes      Palpation   Palpation comment TTP bil LS, C6-C7 hypomobile      Special Tests    Special Tests Cervical    Cervical Tests Spurling's;Dictraction      Spurling's  Findings Negative      Distraction Test   Findngs Positive    Comment reports  decreased symptoms                        Objective measurements completed on examination: See above findings.       [facility] Adult PT Treatment/Exercise - 07/19/20 0001       Modalities   Modalities Electrical Stimulation;Moist Heat      Moist Heat Therapy   Number Minutes Moist Heat 10 Minutes      Electrical Stimulation   Electrical Stimulation Location lower cervical    Electrical Stimulation Action IFC    Electrical Stimulation Parameters 80-150Hz / 8.5-11Cv    Electrical Stimulation Goals Tone;Pain                    PT Education - 07/19/20 1638     Education Details Seated LS stretch bilateral (3x each side)    Person(s) Educated Patient    Methods Explanation;Demonstration    Comprehension Verbalized understanding;Returned demonstration              PT Short Term Goals - 07/19/20 1649       PT SHORT TERM GOAL #1   Title Patient to be indep with initial HEP.    Baseline Lacks awareness of HEP    Time 1    Period Weeks    Status New    Target Date 07/26/20               PT Long Term Goals - 07/19/20 1650       PT LONG TERM GOAL #1   Title Patient will be independent with HEP    Time 4    Period Weeks    Target Date 08/16/20      PT LONG TERM GOAL #2   Title Patient will be able to perform lifting greater than 10# without oncreased symptoms    Baseline "pain with lifting the lightest thing"    Time 4    Period Weeks    Status New    Target Date 08/16/20      PT LONG TERM GOAL #4   Title Patient will be able to produce 4/5 or greater bil UE stength    Baseline see obj    Time 4    Period Weeks    Status New    Target Date 08/16/20                    Plan - 07/19/20 1638     Clinical Impression Statement Patient is a 60 yr old male who presents to physical therapy with pain in cervical region. His signs and symptoms are consistent with mechanical deficits. He denies radicular symptoms past the  shoulder blades. Patient with full cervical AROM with no reports of pain or discomfort with motion. patient limited with L UE strength and has difficulty with scaplar control secondary to kyphotic posture. Patient reports tenderness to palpation along bilateral LS and cervical vertebra 4-6. Patient denies changes in sensation - no numbness, tingling, or neuropathic symptoms. Skilled PT recommended to promote improved mechanical mobility and reduce present restrictions at cervical musculature.    Stability/Clinical Decision Making Evolving/Moderate complexity    Clinical Decision Making Moderate    Rehab Potential Good    PT Frequency 2x / week    PT Duration 4 weeks    PT Treatment/Interventions Cryotherapy;Electrical Stimulation;Ultrasound;Moist Heat;Therapeutic exercise;Therapeutic  activities;Neuromuscular re-education;Patient/family education;Manual techniques;Dry needling;Energy conservation    PT Next Visit Plan UBE, chin tucks, cervical AROM, STW/M to cervical spine, attempt manual traction    Consulted and Agree with Plan of Care Patient             Patient will benefit from skilled therapeutic intervention in order to improve the following deficits and impairments:  Decreased activity tolerance, Decreased strength, Hypomobility, Increased muscle spasms, Pain, Impaired UE functional use, Postural dysfunction, Impaired tone  Visit Diagnosis: Cervicalgia  Muscle weakness (generalized)  Abnormal posture     Problem List Patient Active Problem List   Diagnosis Date Noted   History of COVID-19 05/10/2020   BPH (benign prostatic hyperplasia) 11/09/2019   Rib pain on right side 05/05/2019   Anginal equivalent ([place]) 01/18/2019   Chronic heart failure with preserved ejection fraction ([place]) 01/18/2019   Dyspnea on exertion 01/18/2019   Osteoarthritis of finger of right hand 07/21/2018   Mass of right upper extremity 07/21/2018   Hypogonadism, male 03/23/2018   Allergy to meat  06/21/2015   Essential hypertension 02/05/2015   Sciatica of left side 10/02/2014   Right knee meniscal tear 10/26/2013   GERD (gastroesophageal reflux disease) 12/23/2012   Hyperlipidemia with target LDL less than 100 12/23/2012   Type 2 diabetes mellitus without complication, without long-term current use of insulin ([place]) 12/23/2012   Asthma, mild intermittent 12/23/2012   Muscular dystrophy ([place]) 04/29/2012   Right shoulder pain 03/02/2012    Marylou Mccoy 07/19/2020, 4:55 PM  [facility] [address] [place], [place], [address] Phone: [phone]   Fax:  [phone]  Name: Johnny Mathews MRN: [id number] Date of Birth: [birth year]

## 2020-07-24 ENCOUNTER — Ambulatory Visit: Payer: BC Managed Care – PPO

## 2020-07-24 ENCOUNTER — Other Ambulatory Visit: Payer: Self-pay

## 2020-07-24 DIAGNOSIS — M542 Cervicalgia: Secondary | ICD-10-CM | POA: Diagnosis not present

## 2020-07-24 DIAGNOSIS — R293 Abnormal posture: Secondary | ICD-10-CM

## 2020-07-24 DIAGNOSIS — M6281 Muscle weakness (generalized): Secondary | ICD-10-CM | POA: Diagnosis not present

## 2020-07-24 NOTE — Therapy (Signed)
[facility] [place], [place], [address] Phone: [phone]   Fax:  [phone]  Physical Therapy Treatment  Patient Details  Name: Johnny Mathews MRN: [id number] Date of Birth: 1960-02-17 Referring Provider (PT): Narda Amber, DO   Encounter Date: 07/24/2020   PT End of Session - 07/24/20 1618     Visit Number 2    Number of Visits 8    Date for PT Re-Evaluation 08/16/20    PT Start Time 6962    PT Stop Time 1600    PT Time Calculation (min) 45 min             Past Medical History:  Diagnosis Date   Allergy    Alpha galactosidase deficiency    Anemia    past hx    Arthritis    Asthma    Blood transfusion without reported diagnosis    BPH (benign prostatic hypertrophy)    Cataract    removed left eye    Complication of anesthesia    Diabetes mellitus without complication ([place])    GERD (gastroesophageal reflux disease)    HOH (hard of hearing)    Hyperlipidemia    Hypertension    Irregular heart beat    Muscular dystrophy ([place])    PONV (postoperative nausea and vomiting)    Wears hearing aid in both ears     Past Surgical History:  Procedure Laterality Date   CATARACT EXTRACTION Left    COLONOSCOPY     FEMUR FRACTURE SURGERY     KIDNEY STONE SURGERY     x6   KNEE SURGERY Right    x2   MASS EXCISION Right 08/02/2018   Procedure: EXCISION RIGHT LONG FINGER MASS, DEBRIDEMENT PROXIMAL INTERPHALANGEAL JOINT WITH ROTATION FLAP;  Surgeon: Leanora Cover, MD;  Location: [facility];  Service: Orthopedics;  Laterality: Right;   NISSEN FUNDOPLICATION     [facility]    SHOULDER OPEN ROTATOR CUFF REPAIR      There were no vitals filed for this visit.   Subjective Assessment - 07/24/20 1611     Subjective COVID-19 screening performed upon arrival. Patient states that he felt really good after the first visit and was surprised he did not have pain but then after he raised his arms over his head,  his R upper shoulder area started hurting/going numb again.    Pertinent History Mitochondrial Muscular Dystrophy, DM, Asthma, previous left RTC surgery    Limitations Lifting    Diagnostic tests MRI 2019 : C5-C6 foraminal stenosis    Patient Stated Goals to get rid of the pain and to be able to lift things    Currently in Pain? Yes    Pain Score 5     Pain Location Back    Pain Orientation Right;Upper    Pain Descriptors / Indicators Aching;Numbness    Pain Type Chronic pain    Pain Onset More than a month ago    Pain Frequency Constant                               [facility] Adult PT Treatment/Exercise - 07/24/20 0001       Exercises   Exercises Neck      Neck Exercises: Theraband   Other Theraband Exercises wall clocks with red tband      Neck Exercises: Standing   Neck Retraction 10 reps      Neck  Exercises: Seated   Cervical Rotation 15 reps    Cervical Rotation Limitations with scapular retraction    Other Seated Exercise seatd LS stretch 3 x 20sec eac side      Modalities   Modalities Electrical Stimulation;Ultrasound      Moist Heat Therapy   Number Minutes Moist Heat 10 Minutes      Electrical Stimulation   Electrical Stimulation Location lower cervical, LS, UT    Electrical Stimulation Action Combo    Electrical Stimulation Parameters 80-150hz  ; 115 v ; 1.5cmw2    Electrical Stimulation Goals Tone;Pain      Ultrasound   Ultrasound Location Upper trap/LS    Ultrasound Parameters 1.5cmW2    Ultrasound Goals Pain      Manual Therapy   Manual Therapy Joint mobilization;Soft tissue mobilization;Scapular mobilization;Myofascial release    Manual therapy comments -improived ST at periscapular region, reduced LS trigger point, reduced palpable tenderness; improved R capular mobilit    Joint Mobilization CPA in seated C4-C7;    Soft tissue mobilization pn and stretch UT, LS    Myofascial Release rocking over scapular border    Scapular  Mobilization lateral  and inferior mob of scap gr III-IV                    PT Education - 07/24/20 1617     Education Details Seated LS stretch reviewd; redyt tband issued for wall clocks at home, TENS unit handout provided    Person(s) Educated Patient    Methods Explanation;Demonstration    Comprehension Verbalized understanding;Returned demonstration              PT Short Term Goals - 07/19/20 1649       PT SHORT TERM GOAL #1   Title Patient to be indep with initial HEP.    Baseline Lacks awareness of HEP    Time 1    Period Weeks    Status New    Target Date 07/26/20               PT Long Term Goals - 07/19/20 1650       PT LONG TERM GOAL #1   Title Patient will be independent with HEP    Time 4    Period Weeks    Target Date 08/16/20      PT LONG TERM GOAL #2   Title Patient will be able to perform lifting greater than 10# without oncreased symptoms    Baseline "pain with lifting the lightest thing"    Time 4    Period Weeks    Status New    Target Date 08/16/20      PT LONG TERM GOAL #4   Title Patient will be able to produce 4/5 or greater bil UE stength    Baseline see obj    Time 4    Period Weeks    Status New    Target Date 08/16/20                   Plan - 07/24/20 1618     Clinical Impression Statement Patient with moderate irritability of symptoims upon arrival. Patient with improved scapular sequence post sessio nadn reduced tension at the LS of the R shoulder. He responds normall to modalities and indicates benefits from skilled PT. Recommendation to continue with address focusing on scapular mobility and strength and improving postural endurance and strength.    Examination-Activity Limitations Lift;Carry;Reach Overhead    Examination-Participation  Restrictions Volunteer    Stability/Clinical Decision Making Evolving/Moderate complexity    Clinical Decision Making Moderate    Rehab Potential Good    PT  Frequency 2x / week    PT Duration 4 weeks    PT Treatment/Interventions Cryotherapy;Electrical Stimulation;Ultrasound;Moist Heat;Therapeutic exercise;Therapeutic activities;Neuromuscular re-education;Patient/family education;Manual techniques;Dry needling;Energy conservation    PT Next Visit Plan UBE, chin tucks, cervical AROM, STW/M to cervical spine, attempt manual traction - improve thoracic mobility and posture    Consulted and Agree with Plan of Care Patient             Patient will benefit from skilled therapeutic intervention in order to improve the following deficits and impairments:  Decreased activity tolerance, Decreased strength, Hypomobility, Increased muscle spasms, Pain, Impaired UE functional use, Postural dysfunction, Impaired tone  Visit Diagnosis: Cervicalgia  Muscle weakness (generalized)  Abnormal posture     Problem List Patient Active Problem List   Diagnosis Date Noted   History of COVID-19 05/10/2020   BPH (benign prostatic hyperplasia) 11/09/2019   Rib pain on right side 05/05/2019   Anginal equivalent ([place]) 01/18/2019   Chronic heart failure with preserved ejection fraction ([place]) 01/18/2019   Dyspnea on exertion 01/18/2019   Osteoarthritis of finger of right hand 07/21/2018   Mass of right upper extremity 07/21/2018   Hypogonadism, male 03/23/2018   Allergy to meat 06/21/2015   Essential hypertension 02/05/2015   Sciatica of left side 10/02/2014   Right knee meniscal tear 10/26/2013   GERD (gastroesophageal reflux disease) 12/23/2012   Hyperlipidemia with target LDL less than 100 12/23/2012   Type 2 diabetes mellitus without complication, without long-term current use of insulin ([place]) 12/23/2012   Asthma, mild intermittent 12/23/2012   Muscular dystrophy ([place]) 04/29/2012   Right shoulder pain 03/02/2012    Marylou Mccoy PT, DPT 07/24/2020, 4:21 PM  [facility] [address] [place],  [place], [address] Phone: [phone]   Fax:  [phone]  Name: Johnny Mathews MRN: [id number] Date of Birth: [birth year]

## 2020-07-31 ENCOUNTER — Institutional Professional Consult (permissible substitution): Payer: BC Managed Care – PPO | Admitting: Cardiology

## 2020-07-31 NOTE — Progress Notes (Deleted)
Electrophysiology Office Note   Date:  07/31/2020   ID:  Amour, Cutrone [birth year], MRN [id number]  PCP:  Dettinger, Fransisca Kaufmann, MD  Cardiologist:  *** Primary Electrophysiologist: *** Brendin Situ Meredith Leeds, MD    Chief Complaint: ***   History of Present Illness: Johnny Mathews is a 60 y.o. male who is being seen today for the evaluation of *** at the request of Fenton Foy, NP. Presenting today for electrophysiology evaluation.  He has a history significant for mitochondrial myopathy, muscular dystrophy, hypertension, hyperlipidemia, arthritis, and asthma.  He had COVID-25 October 2018.  He has been having dyspnea on exertion.  He initially was seen by [facility] cardiology.  He had an echo that showed a preserved ejection fraction with grade 1 diastolic dysfunction and mild aortic stenosis.  Today, he denies*** symptoms of palpitations, chest pain, shortness of breath, orthopnea, PND, lower extremity edema, claudication, dizziness, presyncope, syncope, bleeding, or neurologic sequela. The patient is tolerating medications without difficulties.    Past Medical History:  Diagnosis Date   Allergy    Alpha galactosidase deficiency    Anemia    past hx    Arthritis    Asthma    Blood transfusion without reported diagnosis    BPH (benign prostatic hypertrophy)    Cataract    removed left eye    Complication of anesthesia    Diabetes mellitus without complication (HCC)    GERD (gastroesophageal reflux disease)    HOH (hard of hearing)    Hyperlipidemia    Hypertension    Irregular heart beat    Muscular dystrophy ([place])    PONV (postoperative nausea and vomiting)    Wears hearing aid in both ears    Past Surgical History:  Procedure Laterality Date   CATARACT EXTRACTION Left    COLONOSCOPY     FEMUR FRACTURE SURGERY     KIDNEY STONE SURGERY     x6   KNEE SURGERY Right    x2   MASS EXCISION Right 08/02/2018   Procedure: EXCISION RIGHT LONG FINGER MASS, DEBRIDEMENT  PROXIMAL INTERPHALANGEAL JOINT WITH ROTATION FLAP;  Surgeon: Leanora Cover, MD;  Location: [place];  Service: Orthopedics;  Laterality: Right;   NISSEN FUNDOPLICATION     [facility]    SHOULDER OPEN ROTATOR CUFF REPAIR       Current Outpatient Medications  Medication Sig Dispense Refill   albuterol (PROVENTIL) (2.5 MG/3ML) 0.083% nebulizer solution NEBULIZE 1 VIAL EVERY 6 HOURS AS NEEDED FOR WHEEZING & SHORTNESS OF BREATH 150 mL 1   albuterol (VENTOLIN HFA) 108 (90 Base) MCG/ACT inhaler Inhale 2 puffs into the lungs every 4 (four) hours as needed. (Needs to be seen before next refill) 18 g 11   amitriptyline (ELAVIL) 50 MG tablet Take 1 tablet (50 mg total) by mouth at bedtime. 90 tablet 3   azelastine (ASTELIN) 0.1 % nasal spray USE 1 SPRAY IN EACH NOSTRIL TWICE DAILY AS NEEDED 30 mL 11   benzonatate (TESSALON) 100 MG capsule Take 1 capsule (100 mg total) by mouth 2 (two) times daily as needed for cough. 20 capsule 0   Blood Glucose Monitoring Suppl (CONTOUR BLOOD GLUCOSE SYSTEM) w/Device KIT Test blood sugars four times daily 200 each 5   Budeson-Glycopyrrol-Formoterol (BREZTRI AEROSPHERE) 160-9-4.8 MCG/ACT AERO Inhale 2 puffs into the lungs in the morning and at bedtime. 10.7 g 6   Dulaglutide (TRULICITY) 6.27 OJ/5.0KX SOPN Inject 0.75 mg into the skin once a week. 2 mL  3   EPINEPHrine (EPI-PEN) 0.3 mg/0.3 mL DEVI Inject 0.3 mg into the skin as needed.      esomeprazole (NEXIUM) 40 MG capsule Take 1 capsule (40 mg total) by mouth daily at 12 noon. 90 capsule 3   famotidine (PEPCID) 20 MG tablet Take 1 tablet (20 mg total) by mouth 2 (two) times daily. 180 tablet 3   ferrous sulfate 325 (65 FE) MG tablet Take 325 mg by mouth daily with breakfast.     finasteride (PROSCAR) 5 MG tablet TAKE ONE (1) TABLET EACH DAY 90 tablet 3   fluticasone (FLONASE) 50 MCG/ACT nasal spray Place 1-2 sprays into both nostrils daily. 48 g 11   furosemide (LASIX) 20 MG tablet Take 1 tablet (20 mg  total) by mouth daily as needed. 90 tablet 3   glucose blood (CONTOUR NEXT TEST) test strip Test blood sugars four times daily 200 each 5   losartan (COZAAR) 50 MG tablet Take 1 tablet (50 mg total) by mouth daily. 90 tablet 3   Meclizine HCl 25 MG CHEW TAKE ONE TABLET DAILY AT BEDTIME 90 tablet 0   meloxicam (MOBIC) 7.5 MG tablet TAKE ONE (1) TABLET EACH DAY 90 tablet 3   metoprolol succinate (TOPROL-XL) 25 MG 24 hr tablet Take 25 mg by mouth daily.     mometasone-formoterol (DULERA) 100-5 MCG/ACT AERO Inhale 2 puffs into the lungs in the morning and at bedtime. (Patient not taking: Reported on 07/16/2020) 13 g 3   montelukast (SINGULAIR) 10 MG tablet TAKE ONE TABLET DAILY AT BEDTIME 90 tablet 1   pravastatin (PRAVACHOL) 80 MG tablet TAKE ONE (1) TABLET EACH DAY 90 tablet 3   sildenafil (REVATIO) 20 MG tablet TAKE 1 TO 3 TABLETS DAILY AS NEEDED 30 tablet 3   Spacer/Aero-Holding Chambers DEVI Use with inhaler daily 1 each 0   tamsulosin (FLOMAX) 0.4 MG CAPS capsule TAKE 1 CAPSULE DAILY AFTER SUPPER 90 capsule 3   No current facility-administered medications for this visit.    Allergies:   Eggs or egg-derived products, Galactose, Septra [sulfamethoxazole-trimethoprim], and Sulfonamide derivatives   Social History:  The patient  reports that he quit smoking about 26 years ago. His smoking use included cigarettes. He has a 10.00 pack-year smoking history. He has never used smokeless tobacco. He reports current alcohol use. He reports that he does not use drugs.   Family History:  The patient's family history includes Breast cancer in his mother and sister; Heart disease in his father.    ROS:  Please see the history of present illness.   Otherwise, review of systems is positive for none.   All other systems are reviewed and negative.    PHYSICAL EXAM: VS:  There were no vitals taken for this visit. , BMI There is no height or weight on file to calculate BMI. GEN: Well nourished, well  developed, in no acute distress  HEENT: normal  Neck: no JVD, carotid bruits, or masses Cardiac: ***RRR; no murmurs, rubs, or gallops,no edema  Respiratory:  clear to auscultation bilaterally, normal work of breathing GI: soft, nontender, nondistended, + BS MS: no deformity or atrophy  Skin: warm and dry Neuro:  Strength and sensation are intact Psych: euthymic mood, full affect  EKG:  EKG {ACTION; IS/IS GXQ:[id number]} ordered today. Personal review of the ekg ordered *** shows ***  Recent Labs: 11/09/2019: Hemoglobin 13.6; Platelets 242 02/23/2020: ALT 34; BUN 27; Creatinine, Ser 1.21; Potassium 5.1; Sodium 141    Lipid Panel  Component Value Date/Time   CHOL 186 11/09/2019 1008   TRIG 75 11/09/2019 1008   HDL 85 11/09/2019 1008   CHOLHDL 2.2 11/09/2019 1008   CHOLHDL 4.8 03/30/2009 1807   VLDL 20 03/30/2009 1807   LDLCALC 87 11/09/2019 1008     Wt Readings from Last 3 Encounters:  07/16/20 193 lb (87.5 kg)  06/14/20 196 lb 8 oz (89.1 kg)  02/23/20 193 lb 9.6 oz (87.8 kg)      Other studies Reviewed: Additional studies/ records that were reviewed today include: TTE 12/16/18 Review of the above records today demonstrates:    1. The left ventricle is normal in size with mildly increased wall  thickness.    2. Normal left ventricular systolic function, ejection fraction > 55%.    3. Diastolic dysfunction - grade I (normal filling pressures).    4. Normal right ventricular size and systolic function.    5. Dilated left atrium - mildly to moderately dilated.    6. Aortic stenosis - mild.    ASSESSMENT AND PLAN:  1.  Chronic systolic heart failure with preserved ejection fraction: Currently on Lasix and Aldactone.***  2.  Hypertension:***  3.  Aortic stenosis: Mild on echo.  Plan per primary cardiology.    Current medicines are reviewed at length with the patient today.   The patient {ACTIONS; HAS/DOES NOT HAVE:19233} concerns regarding his medicines.  The  following changes were made today:  {NONE DEFAULTED:18576}  Labs/ tests ordered today include: *** No orders of the defined types were placed in this encounter.    Disposition:   FU with Satcha Storlie {gen number [id number]} {Days to years:10300}  Signed, Happy Begeman Meredith Leeds, MD  07/31/2020 10:27 AM     [facility] [address] [place] [address] [phone] (office) [phone] (fax)

## 2020-08-02 ENCOUNTER — Ambulatory Visit: Payer: BC Managed Care – PPO | Admitting: Physical Therapy

## 2020-08-07 ENCOUNTER — Ambulatory Visit (HOSPITAL_COMMUNITY)
Admission: RE | Admit: 2020-08-07 | Discharge: 2020-08-07 | Disposition: A | Discharge status: Home / Self Care | Payer: BC Managed Care – PPO | Source: Ambulatory Visit | Attending: Pulmonary Disease | Admitting: Pulmonary Disease

## 2020-08-07 ENCOUNTER — Other Ambulatory Visit: Payer: Self-pay

## 2020-08-07 DIAGNOSIS — I2510 Atherosclerotic heart disease of native coronary artery without angina pectoris: Secondary | ICD-10-CM | POA: Diagnosis not present

## 2020-08-07 DIAGNOSIS — J9859 Other diseases of mediastinum, not elsewhere classified: Secondary | ICD-10-CM | POA: Diagnosis not present

## 2020-08-07 DIAGNOSIS — K449 Diaphragmatic hernia without obstruction or gangrene: Secondary | ICD-10-CM | POA: Diagnosis not present

## 2020-08-07 DIAGNOSIS — J984 Other disorders of lung: Secondary | ICD-10-CM | POA: Diagnosis not present

## 2020-08-10 ENCOUNTER — Other Ambulatory Visit: Payer: Self-pay | Admitting: *Deleted

## 2020-08-10 ENCOUNTER — Encounter: Payer: Self-pay | Admitting: Thoracic Surgery (Cardiothoracic Vascular Surgery)

## 2020-08-10 ENCOUNTER — Other Ambulatory Visit: Payer: Self-pay

## 2020-08-10 ENCOUNTER — Institutional Professional Consult (permissible substitution): Payer: BC Managed Care – PPO | Admitting: Thoracic Surgery (Cardiothoracic Vascular Surgery)

## 2020-08-10 VITALS — BP 128/79 | HR 72 | Resp 20 | Ht 68.0 in | Wt 188.0 lb

## 2020-08-10 DIAGNOSIS — K449 Diaphragmatic hernia without obstruction or gangrene: Secondary | ICD-10-CM

## 2020-08-10 NOTE — Progress Notes (Signed)
[address]       [place] [address]             [phone]                    [facility] [place] Medical Record #[id number] Date of Birth: [birth year]  Referring: Garner Nash, DO Primary Care: Dettinger, Fransisca Kaufmann, MD Primary Cardiologist: None  Chief Complaint:    Chief Complaint  Patient presents with   Hiatal Hernia    Initial surgical consult, CT chest 8/2    History of Present Illness:    Johnny Mathews 60 y.o. male referred for surgical evaluation of a recurrent moderate size hiatal hernia.  He was previously seen by pulmonary medicine for chronic cough.  This occurred after he had COVID in 2020.  His cough has been nonproductive but persistent for the last 2 years.  In regards to his reflux he does have some mild reflux symptoms, but this is much better than what he had over 30 years ago at his original operation.  He denies any dysphagia.  He does have concerns that his reflux will get worse if the hiatal hernia is not addressed.      Zubrod Score: At the time of surgery this patient's most appropriate activity status/level should be described as: _0     0    Normal activity, no symptoms _1     1    Restricted in physical strenuous activity but ambulatory, able to do out light work _2     2    Ambulatory and capable of self care, unable to do work activities, up and about               >50 % of waking hours                              _3     3    Only limited self care, in bed greater than 50% of waking hours _4     4    Completely disabled, no self care, confined to bed or chair _5     5    Moribund   Past Medical History:  Diagnosis Date   Allergy    Alpha galactosidase deficiency    Anemia    past hx    Arthritis    Asthma    Blood transfusion without reported diagnosis    BPH (benign prostatic hypertrophy)    Cataract    removed left eye    Complication of anesthesia    Diabetes mellitus without complication (HCC)    GERD  (gastroesophageal reflux disease)    HOH (hard of hearing)    Hyperlipidemia    Hypertension    Irregular heart beat    Muscular dystrophy ([place])    PONV (postoperative nausea and vomiting)    Wears hearing aid in both ears     Past Surgical History:  Procedure Laterality Date   CATARACT EXTRACTION Left    COLONOSCOPY     FEMUR FRACTURE SURGERY     KIDNEY STONE SURGERY     x6   KNEE SURGERY Right    x2   MASS EXCISION Right 08/02/2018   Procedure: EXCISION RIGHT LONG FINGER MASS, DEBRIDEMENT PROXIMAL INTERPHALANGEAL JOINT WITH ROTATION FLAP;  Surgeon: Leanora Cover, MD;  Location: [place];  Service: Orthopedics;  Laterality: Right;   NISSEN  FUNDOPLICATION     [facility]    SHOULDER OPEN ROTATOR CUFF REPAIR      Family History  Problem Relation Age of Onset   Breast cancer Mother    Heart disease Father    Breast cancer Sister    Colon cancer Neg Hx    Colon polyps Neg Hx    Esophageal cancer Neg Hx    Rectal cancer Neg Hx    Stomach cancer Neg Hx      Social History   Tobacco Use  Smoking Status Former   Packs/day: 1.00   Years: 10.00   Pack years: 10.00   Types: Cigarettes   Quit date: 01/06/1994   Years since quitting: 26.6  Smokeless Tobacco Never    Social History   Substance and Sexual Activity  Alcohol Use Yes   Alcohol/week: 0.0 standard drinks   Comment: social     Allergies  Allergen Reactions   Eggs Or Egg-Derived Products Swelling    Throat- 03-31-2019 pt states can eat cakes and Pie with no issues  Throat- 03-31-2019 pt states can eat cakes and Pie with no issues    Galactose     Alpha- Gal    Septra [Sulfamethoxazole-Trimethoprim] Swelling    throat   Sulfonamide Derivatives     REACTION: hives    Current Outpatient Medications  Medication Sig Dispense Refill   albuterol (PROVENTIL) (2.5 MG/3ML) 0.083% nebulizer solution NEBULIZE 1 VIAL EVERY 6 HOURS AS NEEDED FOR WHEEZING & SHORTNESS OF BREATH 150 mL 1   albuterol  (VENTOLIN HFA) 108 (90 Base) MCG/ACT inhaler Inhale 2 puffs into the lungs every 4 (four) hours as needed. (Needs to be seen before next refill) 18 g 11   amitriptyline (ELAVIL) 50 MG tablet Take 1 tablet (50 mg total) by mouth at bedtime. 90 tablet 3   azelastine (ASTELIN) 0.1 % nasal spray USE 1 SPRAY IN EACH NOSTRIL TWICE DAILY AS NEEDED 30 mL 11   Blood Glucose Monitoring Suppl (CONTOUR BLOOD GLUCOSE SYSTEM) w/Device KIT Test blood sugars four times daily 200 each 5   Budeson-Glycopyrrol-Formoterol (BREZTRI AEROSPHERE) 160-9-4.8 MCG/ACT AERO Inhale 2 puffs into the lungs in the morning and at bedtime. 10.7 g 6   Dulaglutide (TRULICITY) 3.14 HF/0.2OV SOPN Inject 0.75 mg into the skin once a week. 2 mL 3   EPINEPHrine (EPI-PEN) 0.3 mg/0.3 mL DEVI Inject 0.3 mg into the skin as needed.      esomeprazole (NEXIUM) 40 MG capsule Take 1 capsule (40 mg total) by mouth daily at 12 noon. 90 capsule 3   famotidine (PEPCID) 20 MG tablet Take 1 tablet (20 mg total) by mouth 2 (two) times daily. 180 tablet 3   ferrous sulfate 325 (65 FE) MG tablet Take 325 mg by mouth daily with breakfast.     finasteride (PROSCAR) 5 MG tablet TAKE ONE (1) TABLET EACH DAY 90 tablet 3   fluticasone (FLONASE) 50 MCG/ACT nasal spray Place 1-2 sprays into both nostrils daily. 48 g 11   furosemide (LASIX) 20 MG tablet Take 1 tablet (20 mg total) by mouth daily as needed. 90 tablet 3   glucose blood (CONTOUR NEXT TEST) test strip Test blood sugars four times daily 200 each 5   losartan (COZAAR) 50 MG tablet Take 1 tablet (50 mg total) by mouth daily. 90 tablet 3   Meclizine HCl 25 MG CHEW TAKE ONE TABLET DAILY AT BEDTIME 90 tablet 0   meloxicam (MOBIC) 7.5 MG tablet TAKE ONE (1) TABLET EACH  DAY 90 tablet 3   metoprolol succinate (TOPROL-XL) 25 MG 24 hr tablet Take 25 mg by mouth daily.     montelukast (SINGULAIR) 10 MG tablet TAKE ONE TABLET DAILY AT BEDTIME 90 tablet 1   pravastatin (PRAVACHOL) 80 MG tablet TAKE ONE (1) TABLET  EACH DAY 90 tablet 3   sildenafil (REVATIO) 20 MG tablet TAKE 1 TO 3 TABLETS DAILY AS NEEDED 30 tablet 3   Spacer/Aero-Holding Chambers DEVI Use with inhaler daily 1 each 0   tamsulosin (FLOMAX) 0.4 MG CAPS capsule TAKE 1 CAPSULE DAILY AFTER SUPPER 90 capsule 3   mometasone-formoterol (DULERA) 100-5 MCG/ACT AERO Inhale 2 puffs into the lungs in the morning and at bedtime. (Patient not taking: Reported on 07/16/2020) 13 g 3   No current facility-administered medications for this visit.    Review of Systems  Constitutional: Negative.   Gastrointestinal:  Positive for heartburn. Negative for nausea and vomiting.  Musculoskeletal: Negative.     PHYSICAL EXAMINATION: BP 128/79 (BP Location: Left Arm, Patient Position: Sitting)   Pulse 72   Resp 20   Ht _0  (1.727 m)   Wt 188 lb (85.3 kg)   SpO2 98% Comment: RA  BMI 28.59 kg/m  Physical Exam Constitutional:      General: He is not in acute distress.    Appearance: He is obese. He is not ill-appearing.  HENT:     Head: Normocephalic and atraumatic.  Cardiovascular:     Rate and Rhythm: Normal rate.  Pulmonary:     Effort: Pulmonary effort is normal. No respiratory distress.  Abdominal:     General: There is no distension.     Palpations: Abdomen is soft.    Musculoskeletal:     Cervical back: Normal range of motion.  Neurological:     Mental Status: He is alert.    Diagnostic Studies & Laboratory data:    CT Scan: 08/07/2020.  IMPRESSION: Similar appearance of recurrent hiatal hernia, extending 5-6 cm into the inferior mediastinum.   Improved appearance of the lungs. Very minimal postinflammatory scarring.   Calcification in the region of the aortic valve. Some coronary artery calcification, not quantitated   Aortic Atherosclerosis (ICD10-I70.0).  Very minimal amount     I have independently reviewed the above radiology studies  and reviewed the findings with the patient.   Recent Lab Findings: Lab Results   Component Value Date   WBC 6.3 11/09/2019   HGB 13.6 11/09/2019   HCT 40.7 11/09/2019   PLT 242 11/09/2019   GLUCOSE 84 02/23/2020   CHOL 186 11/09/2019   TRIG 75 11/09/2019   HDL 85 11/09/2019   LDLCALC 87 11/09/2019   ALT 34 02/23/2020   AST 38 02/23/2020   NA 141 02/23/2020   K 5.1 02/23/2020   CL 103 02/23/2020   CREATININE 1.21 02/23/2020   BUN 27 02/23/2020   CO2 22 02/23/2020   TSH 2.040 06/03/2017   HGBA1C 4.9 02/23/2020       Assessment / Plan:   60 year old male with a recurrent moderate size hiatal hernia.  He also has a chronic cough.  We had a long discussion about the source of his cough and I cannot say definitively that it is related to reflux and potential microaspiration.  On review of images dating back to 2009 it appears as though the hiatal hernia has been present since that point, but he only has had this chronic cough since 2022 after having COVID.  He is concerned  that with the recurrence his reflux will suddenly worsen.  I explained to him that since its been present for 13 years that is unlikely however he would like this hernia fixed.  He is tentatively scheduled for an upper endoscopy, and robotic assisted redo hiatal hernia repair.     I  spent 40 minutes with the patient face to face counseling and coordination of care.    Lajuana Matte 08/10/2020 4:13 PM

## 2020-08-10 NOTE — H&P (View-Only) (Signed)
[address]       [place] [address]             [phone]                    Hubbert W Fishel [place] Medical Record #[id number] Date of Birth: [birth year]  Referring: Garner Nash, DO Primary Care: Dettinger, Fransisca Kaufmann, MD Primary Cardiologist: None  Chief Complaint:    Chief Complaint  Patient presents with   Hiatal Hernia    Initial surgical consult, CT chest 8/2    History of Present Illness:    Johnny Mathews 60 y.o. male referred for surgical evaluation of a recurrent moderate size hiatal hernia.  He was previously seen by pulmonary medicine for chronic cough.  This occurred after he had COVID in 2020.  His cough has been nonproductive but persistent for the last 2 years.  In regards to his reflux he does have some mild reflux symptoms, but this is much better than what he had over 30 years ago at his original operation.  He denies any dysphagia.  He does have concerns that his reflux will get worse if the hiatal hernia is not addressed.      Zubrod Score: At the time of surgery this patient's most appropriate activity status/level should be described as: _0     0    Normal activity, no symptoms _1     1    Restricted in physical strenuous activity but ambulatory, able to do out light work _2     2    Ambulatory and capable of self care, unable to do work activities, up and about               >50 % of waking hours                              _3     3    Only limited self care, in bed greater than 50% of waking hours _4     4    Completely disabled, no self care, confined to bed or chair _5     5    Moribund   Past Medical History:  Diagnosis Date   Allergy    Alpha galactosidase deficiency    Anemia    past hx    Arthritis    Asthma    Blood transfusion without reported diagnosis    BPH (benign prostatic hypertrophy)    Cataract    removed left eye    Complication of anesthesia    Diabetes mellitus without complication (HCC)    GERD  (gastroesophageal reflux disease)    HOH (hard of hearing)    Hyperlipidemia    Hypertension    Irregular heart beat    Muscular dystrophy ([place])    PONV (postoperative nausea and vomiting)    Wears hearing aid in both ears     Past Surgical History:  Procedure Laterality Date   CATARACT EXTRACTION Left    COLONOSCOPY     FEMUR FRACTURE SURGERY     KIDNEY STONE SURGERY     x6   KNEE SURGERY Right    x2   MASS EXCISION Right 08/02/2018   Procedure: EXCISION RIGHT LONG FINGER MASS, DEBRIDEMENT PROXIMAL INTERPHALANGEAL JOINT WITH ROTATION FLAP;  Surgeon: Leanora Cover, MD;  Location: [facility];  Service: Orthopedics;  Laterality: Right;   NISSEN  FUNDOPLICATION     [facility]    SHOULDER OPEN ROTATOR CUFF REPAIR      Family History  Problem Relation Age of Onset   Breast cancer Mother    Heart disease Father    Breast cancer Sister    Colon cancer Neg Hx    Colon polyps Neg Hx    Esophageal cancer Neg Hx    Rectal cancer Neg Hx    Stomach cancer Neg Hx      Social History   Tobacco Use  Smoking Status Former   Packs/day: 1.00   Years: 10.00   Pack years: 10.00   Types: Cigarettes   Quit date: 01/06/1994   Years since quitting: 26.6  Smokeless Tobacco Never    Social History   Substance and Sexual Activity  Alcohol Use Yes   Alcohol/week: 0.0 standard drinks   Comment: social     Allergies  Allergen Reactions   Eggs Or Egg-Derived Products Swelling    Throat- 03-31-2019 pt states can eat cakes and Pie with no issues  Throat- 03-31-2019 pt states can eat cakes and Pie with no issues    Galactose     Alpha- Gal    Septra [Sulfamethoxazole-Trimethoprim] Swelling    throat   Sulfonamide Derivatives     REACTION: hives    Current Outpatient Medications  Medication Sig Dispense Refill   albuterol (PROVENTIL) (2.5 MG/3ML) 0.083% nebulizer solution NEBULIZE 1 VIAL EVERY 6 HOURS AS NEEDED FOR WHEEZING & SHORTNESS OF BREATH 150 mL 1   albuterol  (VENTOLIN HFA) 108 (90 Base) MCG/ACT inhaler Inhale 2 puffs into the lungs every 4 (four) hours as needed. (Needs to be seen before next refill) 18 g 11   amitriptyline (ELAVIL) 50 MG tablet Take 1 tablet (50 mg total) by mouth at bedtime. 90 tablet 3   azelastine (ASTELIN) 0.1 % nasal spray USE 1 SPRAY IN EACH NOSTRIL TWICE DAILY AS NEEDED 30 mL 11   Blood Glucose Monitoring Suppl (CONTOUR BLOOD GLUCOSE SYSTEM) w/Device KIT Test blood sugars four times daily 200 each 5   Budeson-Glycopyrrol-Formoterol (BREZTRI AEROSPHERE) 160-9-4.8 MCG/ACT AERO Inhale 2 puffs into the lungs in the morning and at bedtime. 10.7 g 6   Dulaglutide (TRULICITY) 3.14 HF/0.2OV SOPN Inject 0.75 mg into the skin once a week. 2 mL 3   EPINEPHrine (EPI-PEN) 0.3 mg/0.3 mL DEVI Inject 0.3 mg into the skin as needed.      esomeprazole (NEXIUM) 40 MG capsule Take 1 capsule (40 mg total) by mouth daily at 12 noon. 90 capsule 3   famotidine (PEPCID) 20 MG tablet Take 1 tablet (20 mg total) by mouth 2 (two) times daily. 180 tablet 3   ferrous sulfate 325 (65 FE) MG tablet Take 325 mg by mouth daily with breakfast.     finasteride (PROSCAR) 5 MG tablet TAKE ONE (1) TABLET EACH DAY 90 tablet 3   fluticasone (FLONASE) 50 MCG/ACT nasal spray Place 1-2 sprays into both nostrils daily. 48 g 11   furosemide (LASIX) 20 MG tablet Take 1 tablet (20 mg total) by mouth daily as needed. 90 tablet 3   glucose blood (CONTOUR NEXT TEST) test strip Test blood sugars four times daily 200 each 5   losartan (COZAAR) 50 MG tablet Take 1 tablet (50 mg total) by mouth daily. 90 tablet 3   Meclizine HCl 25 MG CHEW TAKE ONE TABLET DAILY AT BEDTIME 90 tablet 0   meloxicam (MOBIC) 7.5 MG tablet TAKE ONE (1) TABLET EACH  DAY 90 tablet 3   metoprolol succinate (TOPROL-XL) 25 MG 24 hr tablet Take 25 mg by mouth daily.     montelukast (SINGULAIR) 10 MG tablet TAKE ONE TABLET DAILY AT BEDTIME 90 tablet 1   pravastatin (PRAVACHOL) 80 MG tablet TAKE ONE (1) TABLET  EACH DAY 90 tablet 3   sildenafil (REVATIO) 20 MG tablet TAKE 1 TO 3 TABLETS DAILY AS NEEDED 30 tablet 3   Spacer/Aero-Holding Chambers DEVI Use with inhaler daily 1 each 0   tamsulosin (FLOMAX) 0.4 MG CAPS capsule TAKE 1 CAPSULE DAILY AFTER SUPPER 90 capsule 3   mometasone-formoterol (DULERA) 100-5 MCG/ACT AERO Inhale 2 puffs into the lungs in the morning and at bedtime. (Patient not taking: Reported on 07/16/2020) 13 g 3   No current facility-administered medications for this visit.    Review of Systems  Constitutional: Negative.   Gastrointestinal:  Positive for heartburn. Negative for nausea and vomiting.  Musculoskeletal: Negative.     PHYSICAL EXAMINATION: BP 128/79 (BP Location: Left Arm, Patient Position: Sitting)   Pulse 72   Resp 20   Ht _0  (1.727 m)   Wt 188 lb (85.3 kg)   SpO2 98% Comment: RA  BMI 28.59 kg/m  Physical Exam Constitutional:      General: He is not in acute distress.    Appearance: He is obese. He is not ill-appearing.  HENT:     Head: Normocephalic and atraumatic.  Cardiovascular:     Rate and Rhythm: Normal rate.  Pulmonary:     Effort: Pulmonary effort is normal. No respiratory distress.  Abdominal:     General: There is no distension.     Palpations: Abdomen is soft.    Musculoskeletal:     Cervical back: Normal range of motion.  Neurological:     Mental Status: He is alert.    Diagnostic Studies & Laboratory data:    CT Scan: 08/07/2020.  IMPRESSION: Similar appearance of recurrent hiatal hernia, extending 5-6 cm into the inferior mediastinum.   Improved appearance of the lungs. Very minimal postinflammatory scarring.   Calcification in the region of the aortic valve. Some coronary artery calcification, not quantitated   Aortic Atherosclerosis (ICD10-I70.0).  Very minimal amount     I have independently reviewed the above radiology studies  and reviewed the findings with the patient.   Recent Lab Findings: Lab Results   Component Value Date   WBC 6.3 11/09/2019   HGB 13.6 11/09/2019   HCT 40.7 11/09/2019   PLT 242 11/09/2019   GLUCOSE 84 02/23/2020   CHOL 186 11/09/2019   TRIG 75 11/09/2019   HDL 85 11/09/2019   LDLCALC 87 11/09/2019   ALT 34 02/23/2020   AST 38 02/23/2020   NA 141 02/23/2020   K 5.1 02/23/2020   CL 103 02/23/2020   CREATININE 1.21 02/23/2020   BUN 27 02/23/2020   CO2 22 02/23/2020   TSH 2.040 06/03/2017   HGBA1C 4.9 02/23/2020       Assessment / Plan:   60 year old male with a recurrent moderate size hiatal hernia.  He also has a chronic cough.  We had a long discussion about the source of his cough and I cannot say definitively that it is related to reflux and potential microaspiration.  On review of images dating back to 2009 it appears as though the hiatal hernia has been present since that point, but he only has had this chronic cough since 2022 after having COVID.  He is concerned  that with the recurrence his reflux will suddenly worsen.  I explained to him that since its been present for 13 years that is unlikely however he would like this hernia fixed.  He is tentatively scheduled for an upper endoscopy, and robotic assisted redo hiatal hernia repair.     I  spent 40 minutes with the patient face to face counseling and coordination of care.    Lajuana Matte 08/10/2020 4:13 PM

## 2020-08-12 ENCOUNTER — Encounter: Payer: Self-pay | Admitting: *Deleted

## 2020-08-16 ENCOUNTER — Ambulatory Visit: Payer: BC Managed Care – PPO | Admitting: Neurology

## 2020-08-22 ENCOUNTER — Encounter: Payer: Self-pay | Admitting: Family Medicine

## 2020-08-22 ENCOUNTER — Ambulatory Visit: Payer: BC Managed Care – PPO | Admitting: Family Medicine

## 2020-08-22 ENCOUNTER — Other Ambulatory Visit: Payer: Self-pay

## 2020-08-22 VITALS — BP 166/93 | HR 70 | Ht 68.0 in | Wt 188.0 lb

## 2020-08-22 DIAGNOSIS — G4709 Other insomnia: Secondary | ICD-10-CM

## 2020-08-22 DIAGNOSIS — I10 Essential (primary) hypertension: Secondary | ICD-10-CM | POA: Diagnosis not present

## 2020-08-22 DIAGNOSIS — Z23 Encounter for immunization: Secondary | ICD-10-CM | POA: Diagnosis not present

## 2020-08-22 DIAGNOSIS — K219 Gastro-esophageal reflux disease without esophagitis: Secondary | ICD-10-CM

## 2020-08-22 DIAGNOSIS — E119 Type 2 diabetes mellitus without complications: Secondary | ICD-10-CM | POA: Diagnosis not present

## 2020-08-22 DIAGNOSIS — E785 Hyperlipidemia, unspecified: Secondary | ICD-10-CM

## 2020-08-22 LAB — CMP14+EGFR
ALT: 38 IU/L (ref 0–44)
AST: 46 IU/L — ABNORMAL HIGH (ref 0–40)
Albumin/Globulin Ratio: 2.3 — ABNORMAL HIGH (ref 1.2–2.2)
Albumin: 4.6 g/dL (ref 3.8–4.8)
Alkaline Phosphatase: 145 IU/L — ABNORMAL HIGH (ref 44–121)
BUN/Creatinine Ratio: 17 (ref 10–24)
BUN: 17 mg/dL (ref 8–27)
Bilirubin Total: 0.5 mg/dL (ref 0.0–1.2)
CO2: 22 mmol/L (ref 20–29)
Calcium: 9.3 mg/dL (ref 8.6–10.2)
Chloride: 101 mmol/L (ref 96–106)
Creatinine, Ser: 1.03 mg/dL (ref 0.76–1.27)
Globulin, Total: 2 g/dL (ref 1.5–4.5)
Glucose: 74 mg/dL (ref 65–99)
Potassium: 5 mmol/L (ref 3.5–5.2)
Sodium: 139 mmol/L (ref 134–144)
Total Protein: 6.6 g/dL (ref 6.0–8.5)
eGFR: 82 mL/min/{1.73_m2} (ref >59)

## 2020-08-22 LAB — CBC WITH DIFFERENTIAL/PLATELET
Basophils Absolute: 0 10*3/uL (ref 0.0–0.2)
Basos: 1 %
EOS (ABSOLUTE): 0.3 10*3/uL (ref 0.0–0.4)
Eos: 5 %
Hematocrit: 37.9 % (ref 37.5–51.0)
Hemoglobin: 12.9 g/dL — ABNORMAL LOW (ref 13.0–17.7)
Immature Grans (Abs): 0 10*3/uL (ref 0.0–0.1)
Immature Granulocytes: 1 %
Lymphocytes Absolute: 1.5 10*3/uL (ref 0.7–3.1)
Lymphs: 30 %
MCH: 32.2 pg (ref 26.6–33.0)
MCHC: 34 g/dL (ref 31.5–35.7)
MCV: 95 fL (ref 79–97)
Monocytes Absolute: 0.5 10*3/uL (ref 0.1–0.9)
Monocytes: 10 %
Neutrophils Absolute: 2.8 10*3/uL (ref 1.4–7.0)
Neutrophils: 53 %
Platelets: 221 10*3/uL (ref 150–450)
RBC: 4.01 x10E6/uL — ABNORMAL LOW (ref 4.14–5.80)
RDW: 12.5 % (ref 11.6–15.4)
WBC: 5.2 10*3/uL (ref 3.4–10.8)

## 2020-08-22 LAB — LIPID PANEL
Chol/HDL Ratio: 2.6 ratio (ref 0.0–5.0)
Cholesterol, Total: 182 mg/dL (ref 100–199)
HDL: 71 mg/dL (ref >39)
LDL Chol Calc (NIH): 77 mg/dL (ref 0–99)
Triglycerides: 205 mg/dL — ABNORMAL HIGH (ref 0–149)
VLDL Cholesterol Cal: 34 mg/dL (ref 5–40)

## 2020-08-22 LAB — BAYER DCA HB A1C WAIVED: HB A1C (BAYER DCA - WAIVED): 5.2 % (ref <7.0)

## 2020-08-22 MED ORDER — AMITRIPTYLINE HCL 100 MG PO TABS: 100.0000 mg | ORAL_TABLET | Freq: Every day | ORAL | 3 refills | Status: DC

## 2020-08-22 MED ORDER — FAMOTIDINE 20 MG PO TABS: 20.0000 mg | ORAL_TABLET | Freq: Two times a day (BID) | ORAL | 3 refills | Status: DC

## 2020-08-22 NOTE — Progress Notes (Signed)
BP (!) 166/93   Pulse 70   Ht 5' 8"  (1.727 m)   Wt 188 lb (85.3 kg)   SpO2 98%   BMI 28.59 kg/m    Subjective:   Patient ID: Johnny Mathews, male    DOB: November 29, 1960, 60 y.o.   MRN: [id number]  HPI: Johnny Mathews is a 60 y.o. male presenting on 08/22/2020 for Medical Management of Chronic Issues and Diabetes   HPI Hypertension Patient is currently on furosemide and losartan and metoprolol, and their blood pressure today is 166/93 and 151/83 but is really stressed out about the surgery and his father's death. Patient denies any lightheadedness or dizziness. Patient denies headaches, blurred vision, chest pains, shortness of breath, or weakness. Denies any side effects from medication and is content with current medication.   Type 2 diabetes mellitus Patient comes in today for recheck of his diabetes. Patient has been currently taking Trulicity, Y4I is 5.2, doing well with diet and wants and Trulicity because it helps him. Patient is currently on an ACE inhibitor/ARB. Patient has not seen an ophthalmologist this year. Patient denies any issues with their feet. The symptom started onset as an adult hypertension and hyperlipidemia ARE RELATED TO DM   Hyperlipidemia Patient is coming in for recheck of his hyperlipidemia. The patient is currently taking pravastatin. They deny any issues with myalgias or history of liver damage from it. They deny any focal numbness or weakness or chest pain.   Patient's father recently passed away.  Having some difficulty sleeping, increased his amitriptyline to 100 mg at home and seems to be doing better with that, would like a prescription for it.  Relevant past medical, surgical, family and social history reviewed and updated as indicated. Interim medical history since our last visit reviewed. Allergies and medications reviewed and updated.  Review of Systems  Constitutional:  Negative for chills and fever.  Eyes:  Negative for visual disturbance.   Respiratory:  Negative for shortness of breath and wheezing.   Cardiovascular:  Negative for chest pain and leg swelling.  Skin:  Negative for rash.  Neurological:  Negative for dizziness, weakness and numbness.  Psychiatric/Behavioral:  Positive for dysphoric mood and sleep disturbance. Negative for self-injury and suicidal ideas. The patient is nervous/anxious.   All other systems reviewed and are negative.  Per HPI unless specifically indicated above   Allergies as of 08/22/2020       Reactions   Eggs Or Egg-derived Products Swelling   Throat- 03-31-2019 pt states can eat cakes and Pie with no issues  Throat- 03-31-2019 pt states can eat cakes and Pie with no issues    Galactose    Alpha- Gal    Septra [sulfamethoxazole-trimethoprim] Swelling   throat   Sulfonamide Derivatives    REACTION: hives        Medication List        Accurate as of August 22, 2020  8:43 AM. If you have any questions, ask your nurse or doctor.          STOP taking these medications    Dulera 100-5 MCG/ACT Aero Generic drug: mometasone-formoterol Stopped by: Fransisca Kaufmann Michaiah Holsopple, MD       TAKE these medications    albuterol 108 (90 Base) MCG/ACT inhaler Commonly known as: Ventolin HFA Inhale 2 puffs into the lungs every 4 (four) hours as needed. (Needs to be seen before next refill)   albuterol (2.5 MG/3ML) 0.083% nebulizer solution Commonly known as: PROVENTIL NEBULIZE 1  VIAL EVERY 6 HOURS AS NEEDED FOR WHEEZING & SHORTNESS OF BREATH   amitriptyline 100 MG tablet Commonly known as: ELAVIL Take 1 tablet (100 mg total) by mouth at bedtime. What changed:  medication strength how much to take Changed by: Fransisca Kaufmann Natnael Biederman, MD   azelastine 0.1 % nasal spray Commonly known as: ASTELIN USE 1 SPRAY IN EACH NOSTRIL TWICE DAILY AS NEEDED   Breztri Aerosphere 160-9-4.8 MCG/ACT Aero Generic drug: Budeson-Glycopyrrol-Formoterol Inhale 2 puffs into the lungs in the morning and at  bedtime.   Contour Blood Glucose System w/Device Kit Test blood sugars four times daily   EPINEPHrine 0.3 mg/0.3 mL Devi Commonly known as: EPI-PEN Inject 0.3 mg into the skin as needed.   esomeprazole 40 MG capsule Commonly known as: NEXIUM Take 1 capsule (40 mg total) by mouth daily at 12 noon.   famotidine 20 MG tablet Commonly known as: PEPCID Take 1 tablet (20 mg total) by mouth 2 (two) times daily.   ferrous sulfate 325 (65 FE) MG tablet Take 325 mg by mouth daily with breakfast.   finasteride 5 MG tablet Commonly known as: PROSCAR TAKE ONE (1) TABLET EACH DAY   fluticasone 50 MCG/ACT nasal spray Commonly known as: FLONASE Place 1-2 sprays into both nostrils daily.   furosemide 20 MG tablet Commonly known as: LASIX Take 1 tablet (20 mg total) by mouth daily as needed.   glucose blood test strip Commonly known as: Contour Next Test Test blood sugars four times daily   losartan 50 MG tablet Commonly known as: COZAAR Take 1 tablet (50 mg total) by mouth daily.   Meclizine HCl 25 MG Chew TAKE ONE TABLET DAILY AT BEDTIME   meloxicam 7.5 MG tablet Commonly known as: MOBIC TAKE ONE (1) TABLET EACH DAY   metoprolol succinate 25 MG 24 hr tablet Commonly known as: TOPROL-XL Take 25 mg by mouth daily.   montelukast 10 MG tablet Commonly known as: SINGULAIR TAKE ONE TABLET DAILY AT BEDTIME   pravastatin 80 MG tablet Commonly known as: PRAVACHOL TAKE ONE (1) TABLET EACH DAY   sildenafil 20 MG tablet Commonly known as: REVATIO TAKE 1 TO 3 TABLETS DAILY AS NEEDED   Spacer/Aero-Holding Dorise Bullion Use with inhaler daily   tamsulosin 0.4 MG Caps capsule Commonly known as: FLOMAX TAKE 1 CAPSULE DAILY AFTER SUPPER   Trulicity 8.92 JJ/9.4RD Sopn Generic drug: Dulaglutide Inject 0.75 mg into the skin once a week.         Objective:   BP (!) 166/93   Pulse 70   Ht 5' 8"  (1.727 m)   Wt 188 lb (85.3 kg)   SpO2 98%   BMI 28.59 kg/m   Wt Readings  from Last 3 Encounters:  08/22/20 188 lb (85.3 kg)  08/10/20 188 lb (85.3 kg)  07/16/20 193 lb (87.5 kg)    Physical Exam Vitals and nursing note reviewed.  Constitutional:      General: He is not in acute distress.    Appearance: He is well-developed. He is not diaphoretic.  Eyes:     General: No scleral icterus.    Conjunctiva/sclera: Conjunctivae normal.  Neck:     Thyroid: No thyromegaly.  Cardiovascular:     Rate and Rhythm: Normal rate and regular rhythm.     Heart sounds: Normal heart sounds. No murmur heard. Pulmonary:     Effort: Pulmonary effort is normal. No respiratory distress.     Breath sounds: Normal breath sounds. No wheezing.  Musculoskeletal:  General: No swelling. Normal range of motion.     Cervical back: Neck supple.  Lymphadenopathy:     Cervical: No cervical adenopathy.  Skin:    General: Skin is warm and dry.     Findings: No rash.  Neurological:     Mental Status: He is alert and oriented to person, place, and time.     Coordination: Coordination normal.  Psychiatric:        Behavior: Behavior normal.      Assessment & Plan:   Problem List Items Addressed This Visit       Cardiovascular and Mediastinum   Essential hypertension   Relevant Orders   CBC with Differential/Platelet   CMP14+EGFR   Lipid panel   Bayer DCA Hb A1c Waived (Completed)     Digestive   GERD (gastroesophageal reflux disease)   Relevant Medications   famotidine (PEPCID) 20 MG tablet     Endocrine   Type 2 diabetes mellitus without complication, without long-term current use of insulin (HCC) - Primary   Relevant Orders   CBC with Differential/Platelet   CMP14+EGFR   Lipid panel   Bayer DCA Hb A1c Waived (Completed)     Other   Hyperlipidemia with target LDL less than 100   Relevant Orders   CBC with Differential/Platelet   CMP14+EGFR   Lipid panel   Bayer DCA Hb A1c Waived (Completed)   Other Visit Diagnoses     Need for shingles vaccine        Relevant Orders   Varicella-zoster vaccine IM (Shingrix) (Completed)   Other insomnia       Relevant Medications   amitriptyline (ELAVIL) 100 MG tablet       Increase amitriptyline 200 mg nightly.  A1c looks good at 5.2.  No change in medication.  Patient desires to stay on the Trulicity. Follow up plan: Return in about 6 months (around 02/22/2021), or if symptoms worsen or fail to improve, for Physical exam and recheck diabetes and hypertension.  Counseling provided for all of the vaccine components Orders Placed This Encounter  Procedures   Varicella-zoster vaccine IM (Shingrix)   CBC with Differential/Platelet   CMP14+EGFR   Lipid panel   Bayer DCA Hb A1c Waived    Caryl Pina, MD [facility] 08/22/2020, 8:43 AM

## 2020-08-23 ENCOUNTER — Encounter (HOSPITAL_COMMUNITY)
Admission: RE | Admit: 2020-08-23 | Discharge: 2020-08-23 | Disposition: A | Discharge status: Home / Self Care | Payer: BC Managed Care – PPO | Source: Ambulatory Visit | Attending: Thoracic Surgery (Cardiothoracic Vascular Surgery) | Admitting: Thoracic Surgery (Cardiothoracic Vascular Surgery)

## 2020-08-23 ENCOUNTER — Other Ambulatory Visit: Payer: Self-pay

## 2020-08-23 ENCOUNTER — Encounter (HOSPITAL_COMMUNITY): Payer: Self-pay

## 2020-08-23 DIAGNOSIS — I10 Essential (primary) hypertension: Secondary | ICD-10-CM | POA: Insufficient documentation

## 2020-08-23 DIAGNOSIS — Z20822 Contact with and (suspected) exposure to covid-19: Secondary | ICD-10-CM | POA: Insufficient documentation

## 2020-08-23 DIAGNOSIS — Z8616 Personal history of COVID-19: Secondary | ICD-10-CM | POA: Diagnosis not present

## 2020-08-23 DIAGNOSIS — Z8249 Family history of ischemic heart disease and other diseases of the circulatory system: Secondary | ICD-10-CM | POA: Diagnosis not present

## 2020-08-23 DIAGNOSIS — Z01812 Encounter for preprocedural laboratory examination: Secondary | ICD-10-CM | POA: Diagnosis not present

## 2020-08-23 DIAGNOSIS — Z79899 Other long term (current) drug therapy: Secondary | ICD-10-CM | POA: Diagnosis not present

## 2020-08-23 DIAGNOSIS — K449 Diaphragmatic hernia without obstruction or gangrene: Secondary | ICD-10-CM

## 2020-08-23 DIAGNOSIS — J45909 Unspecified asthma, uncomplicated: Secondary | ICD-10-CM | POA: Insufficient documentation

## 2020-08-23 HISTORY — DX: Personal history of urinary calculi: Z87.442

## 2020-08-23 LAB — BLOOD GAS, ARTERIAL
Acid-base deficit: 0.5 mmol/L (ref 0.0–2.0)
Bicarbonate: 22.9 mmol/L (ref 20.0–28.0)
FIO2: 21
O2 Saturation: 98.8 %
Patient temperature: 37
pCO2 arterial: 32.9 mmHg (ref 32.0–48.0)
pH, Arterial: 7.457 — ABNORMAL HIGH (ref 7.350–7.450)
pO2, Arterial: 127 mmHg — ABNORMAL HIGH (ref 83.0–108.0)

## 2020-08-23 LAB — TYPE AND SCREEN
ABO/RH(D): O POS
Antibody Screen: NEGATIVE

## 2020-08-23 LAB — CBC
HCT: 34.7 % — ABNORMAL LOW (ref 39.0–52.0)
Hemoglobin: 11.6 g/dL — ABNORMAL LOW (ref 13.0–17.0)
MCH: 32.1 pg (ref 26.0–34.0)
MCHC: 33.4 g/dL (ref 30.0–36.0)
MCV: 96.1 fL (ref 80.0–100.0)
Platelets: 211 10*3/uL (ref 150–400)
RBC: 3.61 MIL/uL — ABNORMAL LOW (ref 4.22–5.81)
RDW: 12.7 % (ref 11.5–15.5)
WBC: 6.2 10*3/uL (ref 4.0–10.5)
nRBC: 0 % (ref 0.0–0.2)

## 2020-08-23 LAB — COMPREHENSIVE METABOLIC PANEL
ALT: 35 U/L (ref 0–44)
AST: 36 U/L (ref 15–41)
Albumin: 3.8 g/dL (ref 3.5–5.0)
Alkaline Phosphatase: 104 U/L (ref 38–126)
Anion gap: 11 (ref 5–15)
BUN: 16 mg/dL (ref 8–23)
CO2: 20 mmol/L — ABNORMAL LOW (ref 22–32)
Calcium: 8.9 mg/dL (ref 8.9–10.3)
Chloride: 105 mmol/L (ref 98–111)
Creatinine, Ser: 1.29 mg/dL — ABNORMAL HIGH (ref 0.61–1.24)
GFR, Estimated: >60 mL/min (ref >60)
Glucose, Bld: 100 mg/dL — ABNORMAL HIGH (ref 70–99)
Potassium: 3.8 mmol/L (ref 3.5–5.1)
Sodium: 136 mmol/L (ref 135–145)
Total Bilirubin: 1.2 mg/dL (ref 0.3–1.2)
Total Protein: 6.5 g/dL (ref 6.5–8.1)

## 2020-08-23 LAB — APTT: aPTT: 32 seconds (ref 24–36)

## 2020-08-23 LAB — PROTIME-INR
INR: 1 (ref 0.8–1.2)
Prothrombin Time: 13.3 seconds (ref 11.4–15.2)

## 2020-08-23 LAB — GLUCOSE, CAPILLARY: Glucose-Capillary: 93 mg/dL (ref 70–99)

## 2020-08-23 NOTE — Progress Notes (Deleted)
Synopsis: Referred in June 2022 for establish care new pulmonary provider, PCP, by Dettinger, Fransisca Kaufmann, MD  Subjective:   PATIENT ID: Johnny Mathews GENDER: male DOB: Feb 26, 1960, MRN: [id number]  No chief complaint on file.   This is a 60 year old gentleman, past medical history of mitochondrial myopathy, muscular dystrophy, hypertension, hyperlipidemia, arthritis, asthma. . Patient initially seen by Dr. Vaughan Browner.  Initially seen in the office for diagnosis of COVID-19 in November 2020.  Has had multiple steroid tapers from primary care.  Would also seen initially by Dr. Tamala Julian.  Former smoker 10 pack years quit 1985.  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  Was continued on Dulera plus Singulair. He still has persistent cough. He has post-tussive vomiting on occasion. Usually a dry cough and then he gets strangled. Still has days where he has trouble breahting but that's better now. 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  Overall patient has no other significant respiratory complaints he is able to complete most of his activities of daily living.  He was diagnosed with mitochondrial myopathy and muscular dystrophy at [facility].  He states that he was following there for some time until the doctor passed away.  He has not followed up with neurology in several years.  He states he had a EMG and a muscle biopsy that was sent to [facility] and then referred to [facility] for confirmation.  I did recommend that he have follow-up with neurology here.  OV 08/24/2020: Patient here today for preop eval prior to planned robotic assisted hernia repair.  Patient recently saw Dr. Kipp Brood from thoracic surgery.  Also at last office visit we switched him from [facility] to [facility] as treated with a spacer.  Here today for follow-up of asthma symptoms and how he is doing with his new inhaler regimen. ***   Past Medical History:  Diagnosis Date   Allergy    Alpha galactosidase deficiency    Anemia    past hx     Arthritis    Asthma    Blood transfusion without reported diagnosis    BPH (benign prostatic hypertrophy)    Cataract    removed left eye    Complication of anesthesia    Diabetes mellitus without complication (HCC)    GERD (gastroesophageal reflux disease)    History of kidney stones    HOH (hard of hearing)    Hyperlipidemia    Hypertension    Irregular heart beat    Muscular dystrophy (HCC)    PONV (postoperative nausea and vomiting)    Wears hearing aid in both ears      Family History  Problem Relation Age of Onset   Breast cancer Mother    Heart disease Father    Breast cancer Sister    Colon cancer Neg Hx    Colon polyps Neg Hx    Esophageal cancer Neg Hx    Rectal cancer Neg Hx    Stomach cancer Neg Hx      Past Surgical History:  Procedure Laterality Date   CATARACT EXTRACTION Left    COLONOSCOPY     FEMUR FRACTURE SURGERY     KIDNEY STONE SURGERY     x6   KNEE SURGERY Right    x2   MASS EXCISION Right 08/02/2018   Procedure: EXCISION RIGHT LONG FINGER MASS, DEBRIDEMENT PROXIMAL INTERPHALANGEAL JOINT WITH ROTATION FLAP;  Surgeon: Leanora Cover, MD;  Location: [place];  Service: Orthopedics;  Laterality: Right;  NISSEN FUNDOPLICATION     [facility]    SHOULDER OPEN ROTATOR CUFF REPAIR      Social History   Socioeconomic History   Marital status: Married    Spouse name: Not on file   Number of children: Not on file   Years of education: Not on file   Highest education level: Not on file  Occupational History   Occupation: Physiological scientist  Tobacco Use   Smoking status: Former    Packs/day: 1.00    Years: 10.00    Pack years: 10.00    Types: Cigarettes    Quit date: 01/06/1994    Years since quitting: 26.6   Smokeless tobacco: Current    Types: Snuff  Vaping Use   Vaping Use: Never used  Substance and Sexual Activity   Alcohol use: Yes    Alcohol/week: 2.0 - 3.0 standard drinks    Types: 2 - 3 Standard drinks or equivalent per  week    Comment: social   Drug use: No   Sexual activity: Not on file  Other Topics Concern   Not on file  Social History Narrative   Not on file   Social Determinants of Health   Financial Resource Strain: Not on file  Food Insecurity: Not on file  Transportation Needs: Not on file  Physical Activity: Not on file  Stress: Not on file  Social Connections: Not on file  Intimate Partner Violence: Not on file     Allergies  Allergen Reactions   Eggs Or Egg-Derived Products Swelling    Throat- 03-31-2019 pt states can eat cakes and Pie with no issues  Throat- 03-31-2019 pt states can eat cakes and Pie with no issues    Galactose     Alpha- Gal    Septra [Sulfamethoxazole-Trimethoprim] Swelling    throat   Sulfonamide Derivatives Hives     Outpatient Medications Prior to Visit  Medication Sig Dispense Refill   albuterol (PROVENTIL) (2.5 MG/3ML) 0.083% nebulizer solution NEBULIZE 1 VIAL EVERY 6 HOURS AS NEEDED FOR WHEEZING & SHORTNESS OF BREATH 150 mL 1   albuterol (VENTOLIN HFA) 108 (90 Base) MCG/ACT inhaler Inhale 2 puffs into the lungs every 4 (four) hours as needed. (Needs to be seen before next refill) 18 g 11   amitriptyline (ELAVIL) 100 MG tablet Take 1 tablet (100 mg total) by mouth at bedtime. 90 tablet 3   azelastine (ASTELIN) 0.1 % nasal spray USE 1 SPRAY IN EACH NOSTRIL TWICE DAILY AS NEEDED (Patient taking differently: Place 1 spray into both nostrils at bedtime.) 30 mL 11   Blood Glucose Monitoring Suppl (CONTOUR BLOOD GLUCOSE SYSTEM) w/Device KIT Test blood sugars four times daily 200 each 5   Budeson-Glycopyrrol-Formoterol (BREZTRI AEROSPHERE) 160-9-4.8 MCG/ACT AERO Inhale 2 puffs into the lungs in the morning and at bedtime. 10.7 g 6   Dulaglutide (TRULICITY) 4.09 BD/5.3GD SOPN Inject 0.75 mg into the skin once a week. 2 mL 3   EPINEPHrine 0.3 mg/0.3 mL IJ SOAJ injection Inject 0.3 mg into the skin once.     esomeprazole (NEXIUM) 40 MG capsule Take 1 capsule (40 mg  total) by mouth daily at 12 noon. (Patient taking differently: Take 40 mg by mouth in the morning.) 90 capsule 3   famotidine (PEPCID) 20 MG tablet Take 1 tablet (20 mg total) by mouth 2 (two) times daily. (Patient taking differently: Take 20 mg by mouth at bedtime.) 180 tablet 3   ferrous sulfate 325 (65 FE) MG tablet Take 325  mg by mouth daily with breakfast.     finasteride (PROSCAR) 5 MG tablet TAKE ONE (1) TABLET EACH DAY 90 tablet 3   fluticasone (FLONASE) 50 MCG/ACT nasal spray Place 1-2 sprays into both nostrils daily. (Patient taking differently: Place 1-2 sprays into both nostrils at bedtime.) 48 g 11   furosemide (LASIX) 20 MG tablet Take 1 tablet (20 mg total) by mouth daily as needed. (Patient taking differently: Take 20 mg by mouth in the morning.) 90 tablet 3   glucose blood (CONTOUR NEXT TEST) test strip Test blood sugars four times daily 200 each 5   losartan (COZAAR) 50 MG tablet Take 1 tablet (50 mg total) by mouth daily. (Patient taking differently: Take 50 mg by mouth in the morning and at bedtime.) 90 tablet 3   Meclizine HCl 25 MG CHEW TAKE ONE TABLET DAILY AT BEDTIME 90 tablet 0   meloxicam (MOBIC) 7.5 MG tablet TAKE ONE (1) TABLET EACH DAY 90 tablet 3   metoprolol succinate (TOPROL-XL) 25 MG 24 hr tablet Take 25 mg by mouth in the morning.     montelukast (SINGULAIR) 10 MG tablet TAKE ONE TABLET DAILY AT BEDTIME 90 tablet 1   pravastatin (PRAVACHOL) 80 MG tablet TAKE ONE (1) TABLET EACH DAY 90 tablet 3   sildenafil (REVATIO) 20 MG tablet TAKE 1 TO 3 TABLETS DAILY AS NEEDED 30 tablet 3   Spacer/Aero-Holding Chambers DEVI Use with inhaler daily 1 each 0   tamsulosin (FLOMAX) 0.4 MG CAPS capsule TAKE 1 CAPSULE DAILY AFTER SUPPER 90 capsule 3   No facility-administered medications prior to visit.    ROS   Objective:  Physical Exam   There were no vitals filed for this visit.    on RA BMI Readings from Last 3 Encounters:  08/23/20 28.35 kg/m  08/22/20 28.59 kg/m   08/10/20 28.59 kg/m   Wt Readings from Last 3 Encounters:  08/23/20 189 lb 3 oz (85.8 kg)  08/22/20 188 lb (85.3 kg)  08/10/20 188 lb (85.3 kg)     CBC    Component Value Date/Time   WBC 6.2 08/23/2020 1408   RBC 3.61 (L) 08/23/2020 1408   HGB 11.6 (L) 08/23/2020 1408   HGB 12.9 (L) 08/22/2020 0842   HCT 34.7 (L) 08/23/2020 1408   HCT 37.9 08/22/2020 0842   PLT 211 08/23/2020 1408   PLT 221 08/22/2020 0842   MCV 96.1 08/23/2020 1408   MCV 95 08/22/2020 0842   MCH 32.1 08/23/2020 1408   MCHC 33.4 08/23/2020 1408   RDW 12.7 08/23/2020 1408   RDW 12.5 08/22/2020 0842   LYMPHSABS 1.5 08/22/2020 0842   MONOABS 0.5 03/30/2009 1035   EOSABS 0.3 08/22/2020 0842   BASOSABS 0.0 08/22/2020 0842     Chest Imaging: Resolution CT scan of the chest March 2022: Bilateral parenchymal opacities, history of COVID-19.  Suspect these areas of groundglass opacities are related to his COVID-19. They overall seem to be improving.  He does have a moderate hiatal hernia. The patient's images have been independently reviewed by me.    CT chest 08/07/2020: 6 cm hiatal hernia.  Postinflammatory changes consistent with history of COVID-19. The patient's images have been independently reviewed by me.    Pulmonary Functions Testing Results: PFT Results Latest Ref Rng & Units 02/27/2014  FVC-Pre L 3.58  FVC-Predicted Pre % 74  FVC-Post L 3.64  FVC-Predicted Post % 75  Pre FEV1/FVC % % 89  Post FEV1/FCV % % 91  FEV1-Pre L  3.20  FEV1-Predicted Pre % 86  FEV1-Post L 3.31  DLCO uncorrected ml/min/mmHg 25.90  DLCO UNC% % 81  DLVA Predicted % 111  TLC L 5.28  TLC % Predicted % 76  RV % Predicted % 77    FeNO:   Pathology:   Echocardiogram:   Heart Catheterization:     Assessment & Plan:     ICD-10-CM   1. Hiatal hernia  K44.9     2. Cough  R05.9     3. Mitochondrial disease ([place])  E88.40     4. Muscular dystrophy ([place])  G71.00     5. Moderate persistent asthma without  complication  I43.32     6. History of COVID-19  Z86.16       Discussion:  ***  This is a 60 year old gentleman with complaints of cough and gastroesophageal reflux, bloating symptoms at time.  He had a history of COVID-19.  His cough is not really resolved since then.  Its predominantly episodic.  It may or may not be associated with food intake.  His dyspnea on exertion that he had at the time of COVID has resolved.  Plan: I think he has multifactorial reason for cough. I think it is reasonable to have thoracic surgery evaluate his moderate hiatal hernia for repair. I am not sure that his COVID-19 history is playing a role in his cough at this time. As for his asthma we will have him stop his Dulera, start Breztri with a spacer, new prescription and co-pay card.  As for his mitochondrial disease/muscular dystrophy I do think he needs to establish care with a neurologist.  He does not want to return to [facility].  I have placed a referral to meet Dr. Carles Collet.    Current Outpatient Medications:    albuterol (PROVENTIL) (2.5 MG/3ML) 0.083% nebulizer solution, NEBULIZE 1 VIAL EVERY 6 HOURS AS NEEDED FOR WHEEZING & SHORTNESS OF BREATH, Disp: 150 mL, Rfl: 1   albuterol (VENTOLIN HFA) 108 (90 Base) MCG/ACT inhaler, Inhale 2 puffs into the lungs every 4 (four) hours as needed. (Needs to be seen before next refill), Disp: 18 g, Rfl: 11   amitriptyline (ELAVIL) 100 MG tablet, Take 1 tablet (100 mg total) by mouth at bedtime., Disp: 90 tablet, Rfl: 3   azelastine (ASTELIN) 0.1 % nasal spray, USE 1 SPRAY IN EACH NOSTRIL TWICE DAILY AS NEEDED (Patient taking differently: Place 1 spray into both nostrils at bedtime.), Disp: 30 mL, Rfl: 11   Blood Glucose Monitoring Suppl (CONTOUR BLOOD GLUCOSE SYSTEM) w/Device KIT, Test blood sugars four times daily, Disp: 200 each, Rfl: 5   Budeson-Glycopyrrol-Formoterol (BREZTRI AEROSPHERE) 160-9-4.8 MCG/ACT AERO, Inhale 2 puffs into the lungs in the morning and at  bedtime., Disp: 10.7 g, Rfl: 6   Dulaglutide (TRULICITY) 9.51 OA/4.1YS SOPN, Inject 0.75 mg into the skin once a week., Disp: 2 mL, Rfl: 3   EPINEPHrine 0.3 mg/0.3 mL IJ SOAJ injection, Inject 0.3 mg into the skin once., Disp: , Rfl:    esomeprazole (NEXIUM) 40 MG capsule, Take 1 capsule (40 mg total) by mouth daily at 12 noon. (Patient taking differently: Take 40 mg by mouth in the morning.), Disp: 90 capsule, Rfl: 3   famotidine (PEPCID) 20 MG tablet, Take 1 tablet (20 mg total) by mouth 2 (two) times daily. (Patient taking differently: Take 20 mg by mouth at bedtime.), Disp: 180 tablet, Rfl: 3   ferrous sulfate 325 (65 FE) MG tablet, Take 325 mg by mouth daily with  breakfast., Disp: , Rfl:    finasteride (PROSCAR) 5 MG tablet, TAKE ONE (1) TABLET EACH DAY, Disp: 90 tablet, Rfl: 3   fluticasone (FLONASE) 50 MCG/ACT nasal spray, Place 1-2 sprays into both nostrils daily. (Patient taking differently: Place 1-2 sprays into both nostrils at bedtime.), Disp: 48 g, Rfl: 11   furosemide (LASIX) 20 MG tablet, Take 1 tablet (20 mg total) by mouth daily as needed. (Patient taking differently: Take 20 mg by mouth in the morning.), Disp: 90 tablet, Rfl: 3   glucose blood (CONTOUR NEXT TEST) test strip, Test blood sugars four times daily, Disp: 200 each, Rfl: 5   losartan (COZAAR) 50 MG tablet, Take 1 tablet (50 mg total) by mouth daily. (Patient taking differently: Take 50 mg by mouth in the morning and at bedtime.), Disp: 90 tablet, Rfl: 3   Meclizine HCl 25 MG CHEW, TAKE ONE TABLET DAILY AT BEDTIME, Disp: 90 tablet, Rfl: 0   meloxicam (MOBIC) 7.5 MG tablet, TAKE ONE (1) TABLET EACH DAY, Disp: 90 tablet, Rfl: 3   metoprolol succinate (TOPROL-XL) 25 MG 24 hr tablet, Take 25 mg by mouth in the morning., Disp: , Rfl:    montelukast (SINGULAIR) 10 MG tablet, TAKE ONE TABLET DAILY AT BEDTIME, Disp: 90 tablet, Rfl: 1   pravastatin (PRAVACHOL) 80 MG tablet, TAKE ONE (1) TABLET EACH DAY, Disp: 90 tablet, Rfl: 3    sildenafil (REVATIO) 20 MG tablet, TAKE 1 TO 3 TABLETS DAILY AS NEEDED, Disp: 30 tablet, Rfl: 3   Spacer/Aero-Holding Chambers DEVI, Use with inhaler daily, Disp: 1 each, Rfl: 0   tamsulosin (FLOMAX) 0.4 MG CAPS capsule, TAKE 1 CAPSULE DAILY AFTER SUPPER, Disp: 90 capsule, Rfl: 3  I spent 62 minutes dedicated to the care of this patient on the date of this encounter to include pre-visit review of records, face-to-face time with the patient discussing conditions above, post visit ordering of testing, clinical documentation with the electronic health record, making appropriate referrals as documented, and communicating necessary findings to members of the patients care team.   Garner Nash, DO [facility] 08/24/2020 7:55 AM

## 2020-08-23 NOTE — Progress Notes (Addendum)
Surgical Instructions    Your procedure is scheduled on Monday, August 22nd.  Report to [facility] Main Entrance "A" at 5:30 A.M., then check in with the Admitting office.  Call this number if you have problems the morning of surgery:  [phone]   If you have any questions prior to your surgery date call [phone]: Open Monday-Friday 8am-4pm    Remember:  Do not eat or drink after midnight the night before your surgery     Take these medicines the morning of surgery with A SIP OF WATER   Albuterol Nebulizer - if needed  Albuterol inhaler - if needed - bring with you on day of surgery  Breztri Aerosphere inhaler - if needed - bring with you on day of surgery  Epi pen - if needed  Esomeprazole (Nexium) - if needed  Finasteride (Proscar)  Metoprolol Succinate (Toprol-XL)  Pravastatin (Pravachol)    As of today, STOP taking any Aspirin (unless otherwise instructed by your surgeon) Aleve, Naproxen, Ibuprofen, Motrin, Advil, Goody's, BC's, all herbal medications, fish oil, and all vitamins.  WHAT DO I DO ABOUT MY DIABETES MEDICATION?   The day of surgery, do not take other diabetes injectables, including Byetta (exenatide), Bydureon (exenatide ER), Victoza (liraglutide), or Trulicity (dulaglutide).   HOW TO MANAGE YOUR DIABETES BEFORE AND AFTER SURGERY  Why is it important to control my blood sugar before and after surgery? Improving blood sugar levels before and after surgery helps healing and can limit problems. A way of improving blood sugar control is eating a healthy diet by:  Eating less sugar and carbohydrates  Increasing activity/exercise  Talking with your doctor about reaching your blood sugar goals High blood sugars (greater than 180 mg/dL) can raise your risk of infections and slow your recovery, so you will need to focus on controlling your diabetes during the weeks before surgery. Make sure that the doctor who takes care of your diabetes knows about your  planned surgery including the date and location.  How do I manage my blood sugar before surgery? Check your blood sugar at least 4 times a day, starting 2 days before surgery, to make sure that the level is not too high or low.  Check your blood sugar the morning of your surgery when you wake up and every 2 hours until you get to the Short Stay unit.  If your blood sugar is less than 70 mg/dL, you will need to treat for low blood sugar: Do not take insulin. Treat a low blood sugar (less than 70 mg/dL) with  cup of clear juice (cranberry or apple), 4 glucose tablets, OR glucose gel. Recheck blood sugar in 15 minutes after treatment (to make sure it is greater than 70 mg/dL). If your blood sugar is not greater than 70 mg/dL on recheck, call [phone] for further instructions. Report your blood sugar to the short stay nurse when you get to Short Stay.  If you are admitted to the hospital after surgery: Your blood sugar will be checked by the staff and you will probably be given insulin after surgery (instead of oral diabetes medicines) to make sure you have good blood sugar levels. The goal for blood sugar control after surgery is 80-180 mg/dL.              DAY OF SURGERY: Do not wear jewelry  Do not wear lotions, powders, colognes, or deodorant. Men may shave face and neck. Do not bring valuables to the hospital.  [facility] is not responsible for any belongings or valuables.  Do NOT Smoke (Tobacco/Vaping) or drink Alcohol 24 hours prior to your procedure If you use a CPAP at night, you may bring all equipment for your overnight stay.   Contacts, glasses, dentures or bridgework may not be worn into surgery, please bring cases for these belongings   For patients admitted to the hospital, discharge time will be determined by your treatment team.   Patients discharged the day of surgery will not be allowed to drive home, and someone needs to stay with them for 24  hours.  ONLY 1 SUPPORT PERSON MAY BE PRESENT WHILE YOU ARE IN SURGERY. IF YOU ARE TO BE ADMITTED ONCE YOU ARE IN YOUR ROOM YOU WILL BE ALLOWED TWO (2) VISITORS.  Minor children may have two parents present. Special consideration for safety and communication needs will be reviewed on a case by case basis.  Special instructions:    Oral Hygiene is also important to reduce your risk of infection.  Remember - BRUSH YOUR TEETH THE MORNING OF SURGERY WITH YOUR REGULAR TOOTHPASTE   - Preparing For Surgery  Before surgery, you can play an important role. Because skin is not sterile, your skin needs to be as free of germs as possible. You can reduce the number of germs on your skin by washing with CHG (chlorahexidine gluconate) Soap before surgery.  CHG is an antiseptic cleaner which kills germs and bonds with the skin to continue killing germs even after washing.     Please do not use if you have an allergy to CHG or antibacterial soaps. If your skin becomes reddened/irritated stop using the CHG.  Do not shave (including legs and underarms) for at least 48 hours prior to first CHG shower. It is OK to shave your face.  Please follow these instructions carefully.     Shower the NIGHT BEFORE SURGERY and the MORNING OF SURGERY with CHG Soap.   If you chose to wash your hair, wash your hair first as usual with your normal shampoo. After you shampoo, rinse your hair and body thoroughly to remove the shampoo.  Then [employer] and genitals (private parts) with your normal soap and rinse thoroughly to remove soap.  After that Use CHG Soap as you would any other liquid soap. You can apply CHG directly to the skin and wash gently with a scrungie or a clean washcloth.   Apply the CHG Soap to your body ONLY FROM THE NECK DOWN.  Do not use on open wounds or open sores. Avoid contact with your eyes, ears, mouth and genitals (private parts). Wash Face and genitals (private parts)  with your normal soap.    Wash thoroughly, paying special attention to the area where your surgery will be performed.  Thoroughly rinse your body with warm water from the neck down.  DO NOT shower/wash with your normal soap after using and rinsing off the CHG Soap.  Pat yourself dry with a CLEAN TOWEL.  Wear CLEAN PAJAMAS to bed the night before surgery  Place CLEAN SHEETS on your bed the night before your surgery  DO NOT SLEEP WITH PETS.   Day of Surgery:  Take a shower with CHG soap. Wear Clean/Comfortable clothing the morning of surgery Do not apply any deodorants/lotions.   Remember to brush your teeth WITH YOUR REGULAR TOOTHPASTE.   Please read over the following fact sheets that you were given.

## 2020-08-23 NOTE — Progress Notes (Signed)
PCP - Vonna Kotyk Dettinger Cardiologist - Dr. Candis [facility] @ [facility]) Last appt was in July 2022  Chest x-ray - DOS  EKG - 07/28/20 Stress Test - 2017 ECHO - 2017  DM - Type 2 Fasting Blood Sugar - 80s  Aspirin Instructions: stop ASA 5 days prior to procedure  COVID TEST- 08/23/20   Anesthesia review: yes, heart history  Patient denies shortness of breath, fever, cough and chest pain at PAT appointment   All instructions explained to the patient, with a verbal understanding of the material. Patient agrees to go over the instructions while at home for a better understanding. Patient also instructed to self quarantine after being tested for COVID-19. The opportunity to ask questions was provided.

## 2020-08-23 NOTE — Progress Notes (Signed)
Pt unable to give urine sample at PAT appointment.  OK to get on DOS, per Charlena Cross (Nurse for Dr. Kipp Brood)

## 2020-08-24 ENCOUNTER — Ambulatory Visit: Payer: BC Managed Care – PPO | Admitting: Pulmonary Disease

## 2020-08-24 DIAGNOSIS — E8840 Mitochondrial metabolism disorder, unspecified: Secondary | ICD-10-CM

## 2020-08-24 DIAGNOSIS — Z8616 Personal history of COVID-19: Secondary | ICD-10-CM

## 2020-08-24 DIAGNOSIS — R059 Cough, unspecified: Secondary | ICD-10-CM

## 2020-08-24 DIAGNOSIS — G7100 Muscular dystrophy, unspecified: Secondary | ICD-10-CM

## 2020-08-24 DIAGNOSIS — J4540 Moderate persistent asthma, uncomplicated: Secondary | ICD-10-CM

## 2020-08-24 DIAGNOSIS — K449 Diaphragmatic hernia without obstruction or gangrene: Secondary | ICD-10-CM

## 2020-08-24 LAB — SARS CORONAVIRUS 2 (TAT 6-24 HRS): SARS Coronavirus 2: NEGATIVE

## 2020-08-24 NOTE — Anesthesia Preprocedure Evaluation (Addendum)
Anesthesia Evaluation  Patient identified by MRN, date of birth, ID band Patient awake    Reviewed: Allergy & Precautions, NPO status , Patient's Chart, lab work & pertinent test results  Airway Mallampati: III  TM Distance: >3 FB Neck ROM: Full    Dental  (+) Poor Dentition, Missing   Pulmonary asthma , former smoker,    Pulmonary exam normal breath sounds clear to auscultation       Cardiovascular hypertension, Pt. on medications and Pt. on home beta blockers Normal cardiovascular exam+ Valvular Problems/Murmurs AS  Rhythm:Regular Rate:Normal + Systolic murmurs    Neuro/Psych Muscular dystrophy  Neuromuscular disease negative psych ROS   GI/Hepatic Neg liver ROS, hiatal hernia, GERD  Medicated and Controlled,  Endo/Other  diabetes, Oral Hypoglycemic Agents  Renal/GU negative Renal ROS     Musculoskeletal  (+) Arthritis ,   Abdominal   Peds  Hematology  (+) anemia , HLD   Anesthesia Other Findings HIATAL HERNIA  Reproductive/Obstetrics                            Anesthesia Physical Anesthesia Plan  ASA: 3  Anesthesia Plan: General   Post-op Pain Management:    Induction: Intravenous  PONV Risk Score and Plan: 2 and Ondansetron, Dexamethasone, Midazolam and Treatment may vary due to age or medical condition  Airway Management Planned: Oral ETT  Additional Equipment:   Intra-op Plan:   Post-operative Plan: Extubation in OR  Informed Consent: I have reviewed the patients History and Physical, chart, labs and discussed the procedure including the risks, benefits and alternatives for the proposed anesthesia with the patient or authorized representative who has indicated his/her understanding and acceptance.     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  He was previously followed for this at [facility] and has history of muscle biopsies that were analyzed at [facility].  He had not been seen in a number of years and recently reestablished with neurologist Dr. Posey Pronto at [facility].  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  He also evidently reported numbness in his upper back, and MRI cervical spine from May 2019 showed moderate spinal stenosis at C5-6 as well as foraminal stenosis at that level.  He denies any speech/swallow problems.  Dr. Posey Pronto recommended physical therapy to improve lower body strength and address cervical radiculopathy, continue amitriptyline 50 mg at bedtime, and continue otherwise supportive care at this time.  Patient has been followed by pulmonology for asthma and persistent cough.  Last seen by Dr. Valeta Harms 06/14/2020.  Per note, cough was felt to be multifactorial.  Discussed there was reasonable to pursue thoracic surgery to evaluate moderate recurrent hiatal hernia.  Although patient relates his cough to previous COVID-19 infection in October 2020, however Dr. Juline Patch note indicates that he feels this may not be playing a significant role in his cough.  Asthma treatment was changed from [facility] to [facility].  Follows with cardiology at [facility] for history of HTN, mild AS, family history of CAD.  Stress MPI January 21 showed no ischemia, normal LVEF, echo December 2020 showed normal LVEF,  grade 1 DD, mild aortic stenosis.  Holter monitor January 2022 showed frequent NSVT and SVT, no A. fib, and it was noted that patient was on beta agonist at that time.  Last seen by Dr. Candis Musa 07/19/2020.  Per note, doing well overall, no major exertional symptoms, no palpitations or edema,  orthopnea, or PND.  EKG showed sinus rhythm with PAC at a rate of 64 bpm.  He was noted to be stable on current medical therapy, no changes were made, he was advised to follow-up in 6 months.  Discussed anesthesia considerations regarding Kearns-Sayre syndrome with Dr. Suann Larry.  Patient has undergone prior surgeries without complication, including under general anesthesia.  Dr. Ola Spurr advised that plan would likely to be to proceed with usual general anesthesia care.  Patient will be evaluated day of surgery by assigned anesthesiologist.  Additional information about Kearns-Sayre syndrome as relates to anesthesia on patient's physical chart.  Preop labs reviewed, creatinine mildly elevated 1.29, mild anemia with hemoglobin 11.6, otherwise unremarkable.  CT chest 08/07/2020: IMPRESSION: Similar appearance of recurrent hiatal hernia, extending 5-6 cm into the inferior mediastinum.  Improved appearance of the lungs. Very minimal postinflammatory scarring.  Calcification in the region of the aortic valve. Some coronary artery calcification, not quantitated  Cardiac testing per Dr. Elwyn Reach note 07/19/2020 in Care Everywhere: ECG 07/19/2020: NSR with PAC 64 bpm  Holter/Event: 01/2020 Frequent NSVT and SVT (was on B agonist at that time) No Afib  Echo: 12/16/2018 Summary 1. The left ventricle is normal in size with mildly increased wall thickness. 2. Normal left ventricular systolic function, ejection fraction > 55%. 3. Diastolic dysfunction - grade I (normal filling pressures). 4. Normal right ventricular size and systolic function. 5. Dilated left atrium - mildly to moderately dilated. 6. Aortic stenosis - mild.  Coronary CT: Calcium present on chest CT  Stress test: MPI 01/2019; no ischemia; normal LVEF  )       Anesthesia Quick Evaluation

## 2020-08-24 NOTE — Progress Notes (Signed)
Anesthesia Chart Review:  Follows with neurology for history of mitochondrial myopathy and muscle cramping.  He was previously followed for this at [facility] and has history of muscle biopsies that were analyzed at [facility].  He had not been seen in a number of years and recently reestablished with neurologist Dr. Posey Pronto at [facility].  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  He also evidently reported numbness in his upper back, and MRI cervical spine from May 2019 showed moderate spinal stenosis at C5-6 as well as foraminal stenosis at that level.  He denies any speech/swallow problems.  Dr. Posey Pronto recommended physical therapy to improve lower body strength and address cervical radiculopathy, continue amitriptyline 50 mg at bedtime, and continue otherwise supportive care at this time.  Patient has been followed by pulmonology for asthma and persistent cough.  Last seen by Dr. Valeta Harms 06/14/2020.  Per note, cough was felt to be multifactorial.  Discussed there was reasonable to pursue thoracic surgery to evaluate moderate recurrent hiatal hernia.  Although patient relates his cough to previous COVID-19 infection in October 2020, however Dr. Juline Patch note indicates that he feels this may not be playing a significant role in his cough.  Asthma treatment was changed from [facility] to [facility].  Follows with cardiology at [facility] for history of HTN, mild AS, family history of CAD.  Stress MPI January 21 showed no ischemia, normal LVEF, echo December 2020 showed normal LVEF, grade 1 DD, mild aortic stenosis.  Holter monitor January 2022 showed frequent NSVT and SVT, no A. fib, and it was noted that patient was on beta agonist at that time.  Last seen by Dr. Candis Musa 07/19/2020.  Per note, doing well overall, no major exertional  symptoms, no palpitations or edema, orthopnea, or PND.  EKG showed sinus rhythm with PAC at a rate of 64 bpm.  He was noted to be stable on current medical therapy, no changes were made, he was advised to follow-up in 6 months.  Discussed anesthesia considerations regarding Kearns-Sayre syndrome with Dr. Suann Larry.  Patient has undergone prior surgeries without complication, including under general anesthesia.  Dr. Ola Spurr advised that plan would likely to be to proceed with usual general anesthesia care.  Patient will be evaluated day of surgery by assigned anesthesiologist.  Additional information about Kearns-Sayre syndrome as relates to anesthesia on patient's physical chart.  Preop labs reviewed, creatinine mildly elevated 1.29, mild anemia with hemoglobin 11.6, otherwise unremarkable.  CT chest 08/07/2020: IMPRESSION: Similar appearance of recurrent hiatal hernia, extending 5-6 cm into the inferior mediastinum.   Improved appearance of the lungs. Very minimal postinflammatory scarring.   Calcification in the region of the aortic valve. Some coronary artery calcification, not quantitated  Cardiac testing per Dr. Elwyn Reach note 07/19/2020 in Care Everywhere: ECG 07/19/2020: NSR with PAC 64 bpm  Holter/Event: 01/2020 Frequent NSVT and SVT (was on B agonist at that time) No Afib  Echo: 12/16/2018 Summary 1. The left ventricle is normal in size with mildly increased wall thickness. 2. Normal left ventricular systolic function, ejection fraction > 55%. 3. Diastolic dysfunction - grade I (normal filling pressures). 4. Normal right ventricular size and systolic function. 5. Dilated left atrium - mildly to moderately dilated. 6. Aortic stenosis - mild.  Coronary CT: Calcium present on chest CT  Stress test: MPI  01/2019; no ischemia; normal LVEF    Wynonia Musty [facility]/Anesthesiology Phone [phone] 08/24/2020 8:52 AM

## 2020-08-27 ENCOUNTER — Encounter (HOSPITAL_COMMUNITY)
Admission: RE | Disposition: A | Discharge status: Home / Self Care | Payer: Self-pay | Source: Home / Self Care | Attending: Thoracic Surgery (Cardiothoracic Vascular Surgery)

## 2020-08-27 ENCOUNTER — Inpatient Hospital Stay (HOSPITAL_COMMUNITY): Payer: BC Managed Care – PPO

## 2020-08-27 ENCOUNTER — Inpatient Hospital Stay (HOSPITAL_COMMUNITY): Payer: BC Managed Care – PPO | Admitting: Physician Assistant

## 2020-08-27 ENCOUNTER — Other Ambulatory Visit: Payer: Self-pay

## 2020-08-27 ENCOUNTER — Inpatient Hospital Stay (HOSPITAL_COMMUNITY)
Admission: RE | Admit: 2020-08-27 | Discharge: 2020-08-29 | DRG: 328 | Disposition: A | Discharge status: Home / Self Care | Payer: BC Managed Care – PPO | Attending: Thoracic Surgery (Cardiothoracic Vascular Surgery) | Admitting: Thoracic Surgery (Cardiothoracic Vascular Surgery)

## 2020-08-27 ENCOUNTER — Inpatient Hospital Stay (HOSPITAL_COMMUNITY): Payer: BC Managed Care – PPO | Admitting: Anesthesiology

## 2020-08-27 ENCOUNTER — Encounter (HOSPITAL_COMMUNITY): Payer: Self-pay | Admitting: Thoracic Surgery (Cardiothoracic Vascular Surgery)

## 2020-08-27 DIAGNOSIS — Z79899 Other long term (current) drug therapy: Secondary | ICD-10-CM | POA: Diagnosis not present

## 2020-08-27 DIAGNOSIS — Z803 Family history of malignant neoplasm of breast: Secondary | ICD-10-CM | POA: Diagnosis not present

## 2020-08-27 DIAGNOSIS — I10 Essential (primary) hypertension: Secondary | ICD-10-CM | POA: Diagnosis not present

## 2020-08-27 DIAGNOSIS — E785 Hyperlipidemia, unspecified: Secondary | ICD-10-CM | POA: Diagnosis not present

## 2020-08-27 DIAGNOSIS — K449 Diaphragmatic hernia without obstruction or gangrene: Principal | ICD-10-CM | POA: Diagnosis present

## 2020-08-27 DIAGNOSIS — J45909 Unspecified asthma, uncomplicated: Secondary | ICD-10-CM | POA: Diagnosis present

## 2020-08-27 DIAGNOSIS — Z8249 Family history of ischemic heart disease and other diseases of the circulatory system: Secondary | ICD-10-CM

## 2020-08-27 DIAGNOSIS — Z8616 Personal history of COVID-19: Secondary | ICD-10-CM | POA: Diagnosis not present

## 2020-08-27 DIAGNOSIS — E119 Type 2 diabetes mellitus without complications: Secondary | ICD-10-CM | POA: Diagnosis not present

## 2020-08-27 DIAGNOSIS — K9189 Other postprocedural complications and disorders of digestive system: Secondary | ICD-10-CM

## 2020-08-27 DIAGNOSIS — G7100 Muscular dystrophy, unspecified: Secondary | ICD-10-CM | POA: Diagnosis present

## 2020-08-27 DIAGNOSIS — Z7984 Long term (current) use of oral hypoglycemic drugs: Secondary | ICD-10-CM

## 2020-08-27 DIAGNOSIS — K660 Peritoneal adhesions (postprocedural) (postinfection): Secondary | ICD-10-CM | POA: Diagnosis not present

## 2020-08-27 DIAGNOSIS — K219 Gastro-esophageal reflux disease without esophagitis: Secondary | ICD-10-CM | POA: Diagnosis present

## 2020-08-27 DIAGNOSIS — Z87891 Personal history of nicotine dependence: Secondary | ICD-10-CM

## 2020-08-27 DIAGNOSIS — Z01818 Encounter for other preprocedural examination: Secondary | ICD-10-CM | POA: Diagnosis not present

## 2020-08-27 DIAGNOSIS — J939 Pneumothorax, unspecified: Secondary | ICD-10-CM

## 2020-08-27 DIAGNOSIS — Z7951 Long term (current) use of inhaled steroids: Secondary | ICD-10-CM | POA: Diagnosis not present

## 2020-08-27 DIAGNOSIS — N400 Enlarged prostate without lower urinary tract symptoms: Secondary | ICD-10-CM | POA: Diagnosis not present

## 2020-08-27 DIAGNOSIS — J9811 Atelectasis: Secondary | ICD-10-CM | POA: Diagnosis not present

## 2020-08-27 DIAGNOSIS — J9383 Other pneumothorax: Secondary | ICD-10-CM | POA: Diagnosis not present

## 2020-08-27 HISTORY — PX: XI ROBOTIC ASSISTED HIATAL HERNIA REPAIR: SHX6889

## 2020-08-27 HISTORY — PX: ESOPHAGOGASTRODUODENOSCOPY: SHX5428

## 2020-08-27 HISTORY — PX: INSERTION OF MESH: SHX5868

## 2020-08-27 LAB — ABO/RH: ABO/RH(D): O POS

## 2020-08-27 LAB — URINALYSIS, ROUTINE W REFLEX MICROSCOPIC
Bilirubin Urine: NEGATIVE
Glucose, UA: NEGATIVE mg/dL
Hgb urine dipstick: NEGATIVE
Ketones, ur: NEGATIVE mg/dL
Leukocytes,Ua: NEGATIVE
Nitrite: NEGATIVE
Protein, ur: NEGATIVE mg/dL
Specific Gravity, Urine: 1.017 (ref 1.005–1.030)
pH: 6 (ref 5.0–8.0)

## 2020-08-27 LAB — BASIC METABOLIC PANEL
Anion gap: 8 (ref 5–15)
BUN: 16 mg/dL (ref 8–23)
CO2: 26 mmol/L (ref 22–32)
Calcium: 8.7 mg/dL — ABNORMAL LOW (ref 8.9–10.3)
Chloride: 106 mmol/L (ref 98–111)
Creatinine, Ser: 1.17 mg/dL (ref 0.61–1.24)
GFR, Estimated: >60 mL/min (ref >60)
Glucose, Bld: 155 mg/dL — ABNORMAL HIGH (ref 70–99)
Potassium: 4.3 mmol/L (ref 3.5–5.1)
Sodium: 140 mmol/L (ref 135–145)

## 2020-08-27 LAB — CBC
HCT: 32.8 % — ABNORMAL LOW (ref 39.0–52.0)
Hemoglobin: 10.6 g/dL — ABNORMAL LOW (ref 13.0–17.0)
MCH: 32.2 pg (ref 26.0–34.0)
MCHC: 32.3 g/dL (ref 30.0–36.0)
MCV: 99.7 fL (ref 80.0–100.0)
Platelets: 200 10*3/uL (ref 150–400)
RBC: 3.29 MIL/uL — ABNORMAL LOW (ref 4.22–5.81)
RDW: 12.9 % (ref 11.5–15.5)
WBC: 8.2 10*3/uL (ref 4.0–10.5)
nRBC: 0 % (ref 0.0–0.2)

## 2020-08-27 LAB — GLUCOSE, CAPILLARY
Glucose-Capillary: 88 mg/dL (ref 70–99)
Glucose-Capillary: 152 mg/dL — ABNORMAL HIGH (ref 70–99)

## 2020-08-27 SURGERY — REPAIR, HERNIA, HIATAL, ROBOT-ASSISTED
Anesthesia: General | Site: Chest

## 2020-08-27 MED ORDER — BUPIVACAINE LIPOSOME 1.3 % IJ SUSP
INTRAMUSCULAR | Status: AC
  Filled 2020-08-27: qty 20

## 2020-08-27 MED ORDER — ONDANSETRON HCL 4 MG/2ML IJ SOLN
INTRAMUSCULAR | Status: AC
  Filled 2020-08-27: qty 2

## 2020-08-27 MED ORDER — ORAL CARE MOUTH RINSE: 15.0000 mL | Freq: Once | OROMUCOSAL | Status: AC

## 2020-08-27 MED ORDER — ROCURONIUM BROMIDE 10 MG/ML (PF) SYRINGE
PREFILLED_SYRINGE | INTRAVENOUS | Status: DC | PRN
  Administered 2020-08-27 (×3): 20 mg via INTRAVENOUS
  Administered 2020-08-27: 60 mg via INTRAVENOUS

## 2020-08-27 MED ORDER — EPHEDRINE SULFATE-NACL 50-0.9 MG/10ML-% IV SOSY
PREFILLED_SYRINGE | INTRAVENOUS | Status: DC | PRN
  Administered 2020-08-27: 10 mg via INTRAVENOUS
  Administered 2020-08-27: 5 mg via INTRAVENOUS
  Administered 2020-08-27: 10 mg via INTRAVENOUS

## 2020-08-27 MED ORDER — MIDAZOLAM HCL 2 MG/2ML IJ SOLN
INTRAMUSCULAR | Status: DC | PRN
  Administered 2020-08-27: 2 mg via INTRAVENOUS

## 2020-08-27 MED ORDER — ONDANSETRON HCL 4 MG/2ML IJ SOLN
INTRAMUSCULAR | Status: DC | PRN
  Administered 2020-08-27: 4 mg via INTRAVENOUS

## 2020-08-27 MED ORDER — FENTANYL CITRATE (PF) 100 MCG/2ML IJ SOLN
INTRAMUSCULAR | Status: AC
  Administered 2020-08-27: 25 ug via INTRAVENOUS
  Filled 2020-08-27: qty 2

## 2020-08-27 MED ORDER — OXYCODONE HCL 5 MG PO TABS: 5.0000 mg | ORAL_TABLET | Freq: Once | ORAL | Status: DC | PRN

## 2020-08-27 MED ORDER — ROCURONIUM BROMIDE 10 MG/ML (PF) SYRINGE
PREFILLED_SYRINGE | INTRAVENOUS | Status: AC
  Filled 2020-08-27: qty 10

## 2020-08-27 MED ORDER — SUGAMMADEX SODIUM 200 MG/2ML IV SOLN
INTRAVENOUS | Status: DC | PRN
  Administered 2020-08-27: 200 mg via INTRAVENOUS

## 2020-08-27 MED ORDER — KETOROLAC TROMETHAMINE 30 MG/ML IJ SOLN
INTRAMUSCULAR | Status: AC
  Administered 2020-08-27: 30 mg via INTRAVENOUS
  Filled 2020-08-27: qty 1

## 2020-08-27 MED ORDER — MORPHINE SULFATE (PF) 2 MG/ML IV SOLN
2.0000 mg | INTRAVENOUS | Status: DC | PRN
Start: 2020-08-27 — End: 2020-08-29
  Administered 2020-08-27 – 2020-08-28 (×4): 2 mg via INTRAVENOUS
  Filled 2020-08-27 (×4): qty 1

## 2020-08-27 MED ORDER — CEFAZOLIN SODIUM-DEXTROSE 2-4 GM/100ML-% IV SOLN
2.0000 g | Freq: Three times a day (TID) | INTRAVENOUS | Status: AC
  Administered 2020-08-27 – 2020-08-28 (×2): 2 g via INTRAVENOUS
  Filled 2020-08-27 (×2): qty 100

## 2020-08-27 MED ORDER — PHENYLEPHRINE 40 MCG/ML (10ML) SYRINGE FOR IV PUSH (FOR BLOOD PRESSURE SUPPORT)
PREFILLED_SYRINGE | INTRAVENOUS | Status: DC | PRN
  Administered 2020-08-27: 40 ug via INTRAVENOUS
  Administered 2020-08-27: 80 ug via INTRAVENOUS
  Administered 2020-08-27: 40 ug via INTRAVENOUS

## 2020-08-27 MED ORDER — BUPIVACAINE LIPOSOME 1.3 % IJ SUSP
INTRAMUSCULAR | Status: DC | PRN
  Administered 2020-08-27: 50 mL

## 2020-08-27 MED ORDER — LIDOCAINE 2% (20 MG/ML) 5 ML SYRINGE
INTRAMUSCULAR | Status: DC | PRN
  Administered 2020-08-27: 80 mg via INTRAVENOUS

## 2020-08-27 MED ORDER — CEFAZOLIN SODIUM 1 G IJ SOLR
2.0000 g | Freq: Three times a day (TID) | INTRAMUSCULAR | Status: DC
Start: 2020-08-27 — End: 2020-08-27
  Filled 2020-08-27: qty 20

## 2020-08-27 MED ORDER — ACETAMINOPHEN 10 MG/ML IV SOLN
1000.0000 mg | Freq: Once | INTRAVENOUS | Status: DC | PRN
  Administered 2020-08-27: 1000 mg via INTRAVENOUS

## 2020-08-27 MED ORDER — PHENYLEPHRINE 40 MCG/ML (10ML) SYRINGE FOR IV PUSH (FOR BLOOD PRESSURE SUPPORT)
PREFILLED_SYRINGE | INTRAVENOUS | Status: AC
  Filled 2020-08-27: qty 10

## 2020-08-27 MED ORDER — OXYCODONE HCL 5 MG/5ML PO SOLN
5.0000 mg | Freq: Once | ORAL | Status: DC | PRN
Start: 2020-08-27 — End: 2020-08-27

## 2020-08-27 MED ORDER — PROPOFOL 10 MG/ML IV BOLUS
INTRAVENOUS | Status: AC
  Filled 2020-08-27: qty 40

## 2020-08-27 MED ORDER — FENTANYL CITRATE (PF) 250 MCG/5ML IJ SOLN
INTRAMUSCULAR | Status: AC
  Filled 2020-08-27: qty 5

## 2020-08-27 MED ORDER — PROMETHAZINE HCL 25 MG/ML IJ SOLN: 6.2500 mg | INTRAMUSCULAR | Status: DC | PRN

## 2020-08-27 MED ORDER — LIDOCAINE 2% (20 MG/ML) 5 ML SYRINGE
INTRAMUSCULAR | Status: AC
  Filled 2020-08-27: qty 5

## 2020-08-27 MED ORDER — KETOROLAC TROMETHAMINE 30 MG/ML IJ SOLN: 30.0000 mg | Freq: Once | INTRAMUSCULAR | Status: AC

## 2020-08-27 MED ORDER — LACTATED RINGERS IV SOLN: INTRAVENOUS | Status: DC

## 2020-08-27 MED ORDER — CHLORHEXIDINE GLUCONATE 0.12 % MT SOLN: 15.0000 mL | Freq: Once | OROMUCOSAL | Status: AC

## 2020-08-27 MED ORDER — PROPOFOL 10 MG/ML IV BOLUS
INTRAVENOUS | Status: DC | PRN
  Administered 2020-08-27: 120 mg via INTRAVENOUS

## 2020-08-27 MED ORDER — MIDAZOLAM HCL 2 MG/2ML IJ SOLN
INTRAMUSCULAR | Status: AC
  Filled 2020-08-27: qty 2

## 2020-08-27 MED ORDER — DEXAMETHASONE SODIUM PHOSPHATE 10 MG/ML IJ SOLN
INTRAMUSCULAR | Status: DC | PRN
Start: 2020-08-27 — End: 2020-08-27
  Administered 2020-08-27: 10 mg via INTRAVENOUS

## 2020-08-27 MED ORDER — KETOROLAC TROMETHAMINE 30 MG/ML IJ SOLN
30.0000 mg | Freq: Four times a day (QID) | INTRAMUSCULAR | Status: DC
  Administered 2020-08-27 – 2020-08-29 (×7): 30 mg via INTRAVENOUS
  Filled 2020-08-27 (×7): qty 1

## 2020-08-27 MED ORDER — DEXAMETHASONE SODIUM PHOSPHATE 10 MG/ML IJ SOLN
INTRAMUSCULAR | Status: AC
  Filled 2020-08-27: qty 1

## 2020-08-27 MED ORDER — FENTANYL CITRATE (PF) 250 MCG/5ML IJ SOLN
INTRAMUSCULAR | Status: DC | PRN
  Administered 2020-08-27: 50 ug via INTRAVENOUS
  Administered 2020-08-27 (×2): 100 ug via INTRAVENOUS

## 2020-08-27 MED ORDER — CHLORHEXIDINE GLUCONATE 0.12 % MT SOLN
OROMUCOSAL | Status: AC
  Administered 2020-08-27: 15 mL via OROMUCOSAL
  Filled 2020-08-27: qty 15

## 2020-08-27 MED ORDER — FENTANYL CITRATE (PF) 100 MCG/2ML IJ SOLN: 25.0000 ug | INTRAMUSCULAR | Status: DC | PRN

## 2020-08-27 MED ORDER — 0.9 % SODIUM CHLORIDE (POUR BTL) OPTIME
TOPICAL | Status: DC | PRN
  Administered 2020-08-27: 2000 mL

## 2020-08-27 MED ORDER — LACTATED RINGERS IV SOLN: INTRAVENOUS | Status: DC | PRN

## 2020-08-27 MED ORDER — PHENYLEPHRINE HCL-NACL 20-0.9 MG/250ML-% IV SOLN
INTRAVENOUS | Status: DC | PRN
  Administered 2020-08-27: 20 ug/min via INTRAVENOUS

## 2020-08-27 MED ORDER — CEFAZOLIN SODIUM 1 G IJ SOLR
INTRAMUSCULAR | Status: AC
  Filled 2020-08-27: qty 20

## 2020-08-27 MED ORDER — ACETAMINOPHEN 10 MG/ML IV SOLN
INTRAVENOUS | Status: AC
  Filled 2020-08-27: qty 100

## 2020-08-27 MED ORDER — AMISULPRIDE (ANTIEMETIC) 5 MG/2ML IV SOLN: 10.0000 mg | Freq: Once | INTRAVENOUS | Status: DC | PRN

## 2020-08-27 MED ORDER — EPHEDRINE 5 MG/ML INJ
INTRAVENOUS | Status: AC
  Filled 2020-08-27: qty 5

## 2020-08-27 MED ORDER — CEFAZOLIN SODIUM-DEXTROSE 2-4 GM/100ML-% IV SOLN
2.0000 g | INTRAVENOUS | Status: AC
  Administered 2020-08-27 (×2): 2 g via INTRAVENOUS
  Filled 2020-08-27: qty 100

## 2020-08-27 MED ORDER — BUPIVACAINE HCL (PF) 0.5 % IJ SOLN
INTRAMUSCULAR | Status: AC
  Filled 2020-08-27: qty 30

## 2020-08-27 SURGICAL SUPPLY — 101 items
BLADE CLIPPER SURG (BLADE) ×3 IMPLANT
BLADE SURG 11 STRL SS (BLADE) ×3 IMPLANT
BUTTON OLYMPUS DEFENDO 5 PIECE (MISCELLANEOUS) ×3 IMPLANT
CANISTER SUCT 3000ML PPV (MISCELLANEOUS) ×6 IMPLANT
CANNULA REDUC XI 12-8 STAPL (CANNULA) ×1
CANNULA REDUCER 12-8 DVNC XI (CANNULA) ×2 IMPLANT
CATH THORACIC 28FR (CATHETERS) IMPLANT
CNTNR URN SCR LID CUP LEK RST (MISCELLANEOUS) ×2 IMPLANT
CONT SPEC 4OZ STRL OR WHT (MISCELLANEOUS) ×2
COVER BACK TABLE 60X90IN (DRAPES) IMPLANT
DECANTER SPIKE VIAL GLASS SM (MISCELLANEOUS) IMPLANT
DEFOGGER SCOPE WARMER CLEARIFY (MISCELLANEOUS) ×3 IMPLANT
DERMABOND ADVANCED (GAUZE/BANDAGES/DRESSINGS) ×1
DERMABOND ADVANCED .7 DNX12 (GAUZE/BANDAGES/DRESSINGS) ×2 IMPLANT
DEVICE SUTURE ENDOST 10MM (ENDOMECHANICALS) IMPLANT
DRAIN PENROSE 1/4X12 LTX STRL (WOUND CARE) IMPLANT
DRAPE COLUMN DVNC XI (DISPOSABLE) ×2 IMPLANT
DRAPE CV SPLIT W-CLR ANES SCRN (DRAPES) ×3 IMPLANT
DRAPE DA VINCI XI COLUMN (DISPOSABLE) ×2
DRAPE INCISE IOBAN 66X45 STRL (DRAPES) IMPLANT
DRAPE ORTHO SPLIT 77X108 STRL (DRAPES) ×3
DRAPE SURG ORHT 6 SPLT 77X108 (DRAPES) ×2 IMPLANT
ELECT REM PT RETURN 9FT ADLT (ELECTROSURGICAL) ×3
ELECTRODE REM PT RTRN 9FT ADLT (ELECTROSURGICAL) ×2 IMPLANT
FELT TEFLON 1X6 (MISCELLANEOUS) IMPLANT
FORCEPS GRASP COMBO 8X230 (FORCEP) IMPLANT
GAUZE SPONGE 4X4 12PLY STRL (GAUZE/BANDAGES/DRESSINGS) IMPLANT
GLOVE SURG ENC MOIS LTX SZ7 (GLOVE) ×3 IMPLANT
GLOVE SURG ENC MOIS LTX SZ7.5 (GLOVE) ×12 IMPLANT
GOWN STRL REUS W/ TWL LRG LVL3 (GOWN DISPOSABLE) ×6 IMPLANT
GOWN STRL REUS W/ TWL XL LVL3 (GOWN DISPOSABLE) ×4 IMPLANT
GOWN STRL REUS W/TWL 2XL LVL3 (GOWN DISPOSABLE) ×3 IMPLANT
GOWN STRL REUS W/TWL LRG LVL3 (GOWN DISPOSABLE) ×9
GOWN STRL REUS W/TWL XL LVL3 (GOWN DISPOSABLE) ×4
GRASPER SUT TROCAR 14GX15 (MISCELLANEOUS) IMPLANT
IRRIGATION STRYKERFLOW (MISCELLANEOUS) IMPLANT
IRRIGATOR STRYKERFLOW (MISCELLANEOUS)
IRRIGATOR SUCT 8 DISP DVNC XI (IRRIGATION / IRRIGATOR) ×2 IMPLANT
IRRIGATOR SUCTION 8MM XI DISP (IRRIGATION / IRRIGATOR) ×2
IV NS 1000ML (IV SOLUTION)
IV NS 1000ML BAXH (IV SOLUTION) IMPLANT
KIT BASIN OR (CUSTOM PROCEDURE TRAY) ×3 IMPLANT
KIT TURNOVER KIT B (KITS) ×3 IMPLANT
MARKER SKIN DUAL TIP RULER LAB (MISCELLANEOUS) IMPLANT
MATRIX GENTRIX 7.5X6 HIATAL (Tissue) ×3 IMPLANT
NEEDLE 18GX1X1/2 (RX/OR ONLY) (NEEDLE) IMPLANT
NEEDLE SCLEROTHERAPY 23X2X240 (NEEDLE) IMPLANT
NS IRRIG 1000ML POUR BTL (IV SOLUTION) ×6 IMPLANT
OBTURATOR OPTICAL STANDARD 8MM (TROCAR) ×2
OBTURATOR OPTICAL STND 8 DVNC (TROCAR) ×2
OBTURATOR OPTICALSTD 8 DVNC (TROCAR) ×2 IMPLANT
OIL SILICONE PENTAX (PARTS (SERVICE/REPAIRS)) IMPLANT
PACK CHEST (CUSTOM PROCEDURE TRAY) ×3 IMPLANT
PACK UNIVERSAL I (CUSTOM PROCEDURE TRAY) IMPLANT
PAD ARMBOARD 7.5X6 YLW CONV (MISCELLANEOUS) ×6 IMPLANT
RELOAD STAPLER 3.5X45 BLU DVNC (STAPLE) ×2 IMPLANT
SEAL CANN UNIV 5-8 DVNC XI (MISCELLANEOUS) ×8 IMPLANT
SEAL XI 5MM-8MM UNIVERSAL (MISCELLANEOUS) ×8
SEALER SYNCHRO 8 IS4000 DV (MISCELLANEOUS) ×3
SEALER SYNCHRO 8 IS4000 DVNC (MISCELLANEOUS) ×2 IMPLANT
SET IRRIG TUBING LAPAROSCOPIC (IRRIGATION / IRRIGATOR) IMPLANT
SET TRI-LUMEN FLTR TB AIRSEAL (TUBING) ×3 IMPLANT
SET TUBE SMOKE EVAC HIGH FLOW (TUBING) IMPLANT
SHEET MEDIUM DRAPE 40X70 STRL (DRAPES) IMPLANT
SOL ANTI FOG 6CC (MISCELLANEOUS) IMPLANT
SOLUTION ANTI FOG 6CC (MISCELLANEOUS)
STAPLER 45 DA VINCI SURE FORM (STAPLE) ×2
STAPLER 45 SUREFORM DVNC (STAPLE) ×2 IMPLANT
STAPLER CANNULA SEAL DVNC XI (STAPLE) ×2 IMPLANT
STAPLER CANNULA SEAL XI (STAPLE) ×3
STAPLER RELOAD 3.5X45 BLU DVNC (STAPLE) ×2
STAPLER RELOAD 3.5X45 BLUE (STAPLE) ×2
STOPCOCK 4 WAY LG BORE MALE ST (IV SETS) IMPLANT
SUT ETHIBOND 0 36 GRN (SUTURE) ×9 IMPLANT
SUT SILK  1 MH (SUTURE) ×2
SUT SILK 1 MH (SUTURE) ×2 IMPLANT
SUT SURGIDAC NAB ES-9 0 48 120 (SUTURE) IMPLANT
SUT VIC AB 3-0 SH 27 (SUTURE) ×2
SUT VIC AB 3-0 SH 27X BRD (SUTURE) ×4 IMPLANT
SUT VIC AB 3-0 X1 27 (SUTURE) IMPLANT
SUT VICRYL 0 UR6 27IN ABS (SUTURE) ×6 IMPLANT
SYR 10ML LL (SYRINGE) IMPLANT
SYR 20CC LL (SYRINGE) ×3 IMPLANT
SYR 20ML ECCENTRIC (SYRINGE) IMPLANT
SYR 30ML SLIP (SYRINGE) IMPLANT
SYR 3ML 22GX1 1/2 SAFETY (SYRINGE) ×3 IMPLANT
SYR 50ML LL SCALE MARK (SYRINGE) IMPLANT
SYSTEM SAHARA CHEST DRAIN ATS (WOUND CARE) IMPLANT
TOWEL GREEN STERILE (TOWEL DISPOSABLE) ×3 IMPLANT
TOWEL GREEN STERILE FF (TOWEL DISPOSABLE) ×3 IMPLANT
TRAY FOLEY MTR SLVR 16FR STAT (SET/KITS/TRAYS/PACK) ×3 IMPLANT
TRAY WAYNE PNEUMOTHORAX 14X18 (TRAY / TRAY PROCEDURE) ×3 IMPLANT
TROCAR PORT AIRSEAL 8X120 (TROCAR) ×3 IMPLANT
TROCAR XCEL 12X100 BLDLESS (ENDOMECHANICALS) IMPLANT
TROCAR XCEL BLADELESS 5X75MML (TROCAR) ×3 IMPLANT
TROCAR XCEL NON-BLD 5MMX100MML (ENDOMECHANICALS) IMPLANT
TUBE CONNECTING 20X1/4 (TUBING) IMPLANT
TUBING ENDO SMARTCAP (MISCELLANEOUS) ×3 IMPLANT
TUBING EXTENTION W/L.L. (IV SETS) IMPLANT
UNDERPAD 30X36 HEAVY ABSORB (UNDERPADS AND DIAPERS) IMPLANT
WATER STERILE IRR 1000ML POUR (IV SOLUTION) ×6 IMPLANT

## 2020-08-27 NOTE — Interval H&P Note (Signed)
History and Physical Interval Note:  08/27/2020 7:33 AM  Johnny Mathews  has presented today for surgery, with the diagnosis of HIATAL HERNIA.  The various methods of treatment have been discussed with the patient and family. After consideration of risks, benefits and other options for treatment, the patient has consented to  Procedure(s): XI ROBOTIC [place] (N/A) ESOPHAGOGASTRODUODENOSCOPY (EGD) (N/A) as a surgical intervention.  The patient's history has been reviewed, patient examined, no change in status, stable for surgery.  I have reviewed the patient's chart and labs.  Questions were answered to the patient's satisfaction.     Marylen Zuk Bary Leriche

## 2020-08-27 NOTE — Brief Op Note (Signed)
08/27/2020  12:18 PM  PATIENT:  [name]  60 y.o. male  PRE-OPERATIVE DIAGNOSIS:  HIATAL HERNIA  POST-OPERATIVE DIAGNOSIS:  HIATAL HERNIA  PROCEDURE:ESOPHAGOGASTRODUODENOSCOPY (EGD) , XI ROBOTIC ASSISTED REDO HIATAL HERNIA REPAIR, INSERTION OF ACELL 7.5 x 6cm GENTRIX SURGICAL MATRIX HIATAL MESH   SURGEON:  Surgeon(s) and Role:    Lightfoot, Lucile Crater, MD - Primary  PHYSICIAN ASSISTANT: Lars Pinks PA-C  ANESTHESIA:   general  EBL:  25 mL   BLOOD ADMINISTERED:none  DRAINS:  [place] placed in the left pleural space    LOCAL MEDICATIONS USED:  OTHER Exparel  SPECIMEN:  Source of Specimen:  Hernia sac  DISPOSITION OF SPECIMEN:  PATHOLOGY  COUNTS CORRECT:  YES  DICTATION: .Dragon Dictation  PLAN OF CARE: Admit to inpatient   PATIENT DISPOSITION:  PACU - hemodynamically stable.   Delay start of Pharmacological VTE agent (>24hrs) due to surgical blood loss or risk of bleeding: yes

## 2020-08-27 NOTE — Plan of Care (Signed)
  Problem: Clinical Measurements: Goal: Ability to maintain clinical measurements within normal limits will improve Outcome: Not Progressing Goal: Diagnostic test results will improve Outcome: Not Progressing   Problem: Activity: Goal: Risk for activity intolerance will decrease Outcome: Not Progressing   Problem: Nutrition: Goal: Adequate nutrition will be maintained Outcome: Not Progressing   Problem: Pain Managment: Goal: General experience of comfort will improve Outcome: Not Progressing

## 2020-08-27 NOTE — Discharge Summary (Addendum)
Physician Discharge Summary       [address]       [place] [address]             [phone]    Patient ID: Johnny Mathews MRN: [id number] DOB/AGE: 03/19/1960 60 y.o.  Admit date: 08/27/2020 Discharge date: 08/29/2020  Admission Diagnoses:  Hiatal hernia Discharge Diagnoses:  S/p EGD, robotic assisted redo hiatal hernia repair with A cell mesh 2. History of the following:        Alpha galactosidase deficiency     Anemia      past hx    Arthritis     Asthma     Blood transfusion without reported diagnosis     BPH (benign prostatic hypertrophy)     Cataract      removed left eye    Complication of anesthesia     Diabetes mellitus without complication (HCC)     GERD (gastroesophageal reflux disease)     HOH (hard of hearing)     Hyperlipidemia     Hypertension     Irregular heart beat     Muscular dystrophy ([place])     PONV (postoperative nausea and vomiting)     Wears hearing aid in both ears     Consults: None  Procedure (s):  Esophagoscopy - Robotic assisted laparoscopy -Extensive lysis of adhesions -Repair of recurrent hiatal hernia with ACell mesh -Takedown of previous Nissen fundoplication -Dor fundoplication by Dr. Kipp Brood on 08/27/2020.  History of Presenting Illness: This is a 60 y.o. male referred for surgical evaluation of a recurrent moderate size hiatal hernia.  He was previously seen by pulmonary medicine for chronic cough.  This occurred after he had COVID in 2020.  His cough has been nonproductive but persistent for the last 2 years.  In regards to his reflux, he does have some mild reflux symptoms, but this is much better than what he had over 30 years ago at his original operation.  He denies any dysphagia.  He does have concerns that his reflux will get worse if the hiatal hernia is not addressed. We had a long discussion about the source of his cough and Dr. Kipp Brood cannot say definitively that it is related to reflux and  potential microaspiration.  On review of images dating back to 2009, it appears as though the hiatal hernia has been present since that point, but he only has had this chronic cough since 2022 after having COVID.  He is concerned that with the recurrence his reflux will suddenly worsen.  Dr. Kipp Brood explained to him that since its been present for 13 years that is unlikely; however, he would like this hernia fixed. Dr. Kipp Brood discussed potential risks, complications, and benefits of undergoing an upper endoscopy and robotic assisted redo hiatal hernia repair. He agreed to proceed with surgery.  Brief Hospital Course:  Patient was extubated and taken to PACU in stable condition. He had complaints of left shoulder pain post op. He was given pain medication and a K pad. He remained afebrile and hemodynamically stable. Esophagram was done on 08/23. Results showed no evidence of a contrast leak, the GE junction is below the diaphragm, and moderate to severe esophageal dysmotility. Patient was started on a Dysphagia I diet, which he tolerated. He did not have nausea or vomiting. Medications were crushed initially. He was then instructed to take medications with applesauce. Left pigtail chest tube was to water seal and there was no air leak. Chest  x ray showed stable left apical pneumothorax. Pigtail chest tube was removed. Follow up chest x ray showed no pneumothorax, bibasilar atelectasis. All wounds are clean, dry, and healing without signs of infection. He is ambulating on room air. As discussed with Dr. Kipp Brood, he is surgically stable for discharged today.   Latest Vital Signs: Blood pressure (!) 141/73, pulse 76, temperature 98.7 F (37.1 C), temperature source Oral, resp. rate 17, height 5' 8.5" (1.74 m), weight 86.6 kg, SpO2 97 %.  Physical Exam: Cardiovascular: RRR Pulmonary: Clear to auscultation bilaterally Abdomen: Soft, non tender, bowel sounds present. Extremities: No LE edema Wounds:  Clean and dry.  No erythema or signs of infection.  Discharge Condition:Stable and discharged to home.  Recent laboratory studies:  Lab Results  Component Value Date   WBC 6.6 08/28/2020   HGB 11.3 (L) 08/28/2020   HCT 35.0 (L) 08/28/2020   MCV 99.7 08/28/2020   PLT 194 08/28/2020   Lab Results  Component Value Date   NA 139 08/28/2020   K 3.8 08/28/2020   CL 106 08/28/2020   CO2 26 08/28/2020   CREATININE 1.19 08/28/2020   GLUCOSE 88 08/28/2020      Diagnostic Studies: DG Chest 2 View  Result Date: 08/27/2020 CLINICAL DATA:  Preop for hiatal hernia repair EXAM: CHEST - 2 VIEW COMPARISON:  05/05/2019 FINDINGS: Hiatal hernia over the lower mediastinum. Normal heart size and aortic contours. No acute infiltrate or edema. No effusion or pneumothorax. No acute osseous findings. IMPRESSION: No evidence of active disease. Electronically Signed   By: Monte Fantasia M.D.   On: 08/27/2020 06:48   CT Chest Wo Contrast  Result Date: 08/08/2020 CLINICAL DATA:  Hiatal hernia. Acid reflux over the last month. Hernia surgery performed 30 years ago. EXAM: CT CHEST WITHOUT CONTRAST TECHNIQUE: Multidetector CT imaging of the chest was performed following the standard protocol without IV contrast. COMPARISON:  03/14/2019 FINDINGS: Cardiovascular: Heart size is normal. There is some calcification in the region of the aortic valve. Minimal calcification evident in the left coronary artery. No pericardial fluid. Tiny amount of atherosclerotic calcification of the thoracic aorta. Mediastinum/Nodes: No mass or lymphadenopathy. Similar appearance of recurrent hiatal hernia despite the presence previous surgery. Hernia extends proximally 5-6 cm into the lower mediastinum. Lungs/Pleura: There is been improvement in appearance of the lungs since the prior study in March last year. Previously seen hazy opacity has nearly completely resolved. There may be a very few small foci of scarring. Some chronic  paramediastinal scarring in the right lower lobe is seen, a common finding associated with thoracic osteophytosis. No pleural effusion. Upper Abdomen: Upper abdomen negative other than the hiatal hernia described above. There is probably a cyst of the right kidney. Musculoskeletal: Ordinary thoracic degenerative changes. Bilateral gynecomastia. IMPRESSION: Similar appearance of recurrent hiatal hernia, extending 5-6 cm into the inferior mediastinum. Improved appearance of the lungs. Very minimal postinflammatory scarring. Calcification in the region of the aortic valve. Some coronary artery calcification, not quantitated Aortic Atherosclerosis (ICD10-I70.0).  Very minimal amount. Electronically Signed   By: Nelson Chimes M.D.   On: 08/08/2020 08:21   DG CHEST PORT 1 VIEW  Result Date: 08/28/2020 CLINICAL DATA:  Chest tube surveillance.  Hiatal hernia repair. EXAM: PORTABLE CHEST 1 VIEW COMPARISON:  08/27/2020 FINDINGS: Left basilar chest tube remains in place. Tiny left apical pneumothorax persists, similar in size to prior. Stable heart size. No abnormal shift of the heart or mediastinal structures. Mild left basilar atelectasis. Right lung is  clear. IMPRESSION: Unchanged tiny left apical pneumothorax. Left basilar chest tube remains in place. Electronically Signed   By: Davina Poke D.O.   On: 08/28/2020 08:53   DG Chest Port 1 View  Result Date: 08/27/2020 CLINICAL DATA:  Postop hiatal hernia repair, pneumothorax EXAM: PORTABLE CHEST 1 VIEW COMPARISON:  08/27/2020 FINDINGS: Single frontal view of the chest demonstrates an unremarkable cardiac silhouette. Pigtail drainage catheter overlies the left lung base. There is a trace left apical pneumothorax, volume estimated less than 5%. No tension effect or midline shift. Patchy retrocardiac consolidation may reflect atelectasis. No pleural effusion. Surgical clips at the gastroesophageal junction consistent with prior hiatal hernia repair. No acute bony  abnormalities. IMPRESSION: 1. Trace left apical pneumothorax as above, measuring less than 5%. Pigtail drainage catheter in place, overlying left lung base. 2. Patchy left lower lobe atelectasis. These results will be called to the ordering clinician or representative by the Radiologist Assistant, and communication documented in the PACS or [employer]. Electronically Signed   By: Randa Ngo M.D.   On: 08/27/2020 15:39   DG ESOPHAGUS W SINGLE CM (SOL OR THIN BA)  Result Date: 08/28/2020 CLINICAL DATA:  Rule out esophageal leak EXAM: ESOPHOGRAM/BARIUM SWALLOW TECHNIQUE: Single contrast examination was performed using water-soluble contrast. FLUOROSCOPY TIME:  Fluoroscopy Time:  1 minutes 6 seconds Radiation Exposure Index (if provided by the fluoroscopic device): 17.8 mGy Number of Acquired Spot Images: 0 COMPARISON:  Chest CT 08/07/2020 FINDINGS: No aspiration was seen. There is moderate-severe esophageal dysmotility. Delayed progression of contrast through the gastroesophageal junction. No visible ulceration. The GE junction is below the diaphragm. No evidence of contrast leak. There is a left-sided pleural drain in place. IMPRESSION: No evidence of contrast leak. The GE junction is below the diaphragm. Electronically Signed   By: Maurine Simmering M.D.   On: 08/28/2020 09:43     Discharge Medications: Allergies as of 08/29/2020       Reactions   Eggs Or Egg-derived Products Swelling   Throat- 03-31-2019 pt states can eat cakes and Pie with no issues  Throat- 03-31-2019 pt states can eat cakes and Pie with no issues    Galactose    Alpha- Gal    Septra [sulfamethoxazole-trimethoprim] Swelling   throat   Sulfonamide Derivatives Hives        Medication List     TAKE these medications    albuterol 108 (90 Base) MCG/ACT inhaler Commonly known as: Ventolin HFA Inhale 2 puffs into the lungs every 4 (four) hours as needed. (Needs to be seen before next refill)   albuterol (2.5 MG/3ML)  0.083% nebulizer solution Commonly known as: PROVENTIL NEBULIZE 1 VIAL EVERY 6 HOURS AS NEEDED FOR WHEEZING & SHORTNESS OF BREATH   amitriptyline 100 MG tablet Commonly known as: ELAVIL Take 1 tablet (100 mg total) by mouth at bedtime.   azelastine 0.1 % nasal spray Commonly known as: ASTELIN USE 1 SPRAY IN EACH NOSTRIL TWICE DAILY AS NEEDED What changed:  how much to take how to take this when to take this additional instructions   Breztri Aerosphere 160-9-4.8 MCG/ACT Aero Generic drug: Budeson-Glycopyrrol-Formoterol Inhale 2 puffs into the lungs in the morning and at bedtime.   Contour Blood Glucose System w/Device Kit Test blood sugars four times daily   EPINEPHrine 0.3 mg/0.3 mL Soaj injection Commonly known as: EPI-PEN Inject 0.3 mg into the skin once.   esomeprazole 40 MG capsule Commonly known as: NEXIUM Take 1 capsule (40 mg total) by mouth daily  at 12 noon. What changed: when to take this   famotidine 20 MG tablet Commonly known as: PEPCID Take 1 tablet (20 mg total) by mouth 2 (two) times daily. What changed: when to take this   ferrous sulfate 325 (65 FE) MG tablet Take 325 mg by mouth daily with breakfast.   finasteride 5 MG tablet Commonly known as: PROSCAR TAKE ONE (1) TABLET EACH DAY   fluticasone 50 MCG/ACT nasal spray Commonly known as: FLONASE Place 1-2 sprays into both nostrils daily. What changed: when to take this   furosemide 20 MG tablet Commonly known as: LASIX Take 1 tablet (20 mg total) by mouth daily as needed. What changed: when to take this   glucose blood test strip Commonly known as: Contour Next Test Test blood sugars four times daily   HYDROcodone-acetaminophen 7.5-325 mg/15 ml solution Commonly known as: HYCET Take 10 mLs by mouth every 6 (six) hours as needed for severe pain.   losartan 50 MG tablet Commonly known as: COZAAR Take 1 tablet (50 mg total) by mouth daily. What changed: when to take this   Meclizine HCl  25 MG Chew TAKE ONE TABLET DAILY AT BEDTIME   meloxicam 7.5 MG tablet Commonly known as: MOBIC TAKE ONE (1) TABLET EACH DAY Start taking on: September 12, 2020 What changed: These instructions start on September 12, 2020. If you are unsure what to do until then, ask your doctor or other care provider.   metoprolol succinate 25 MG 24 hr tablet Commonly known as: TOPROL-XL Take 25 mg by mouth in the morning.   montelukast 10 MG tablet Commonly known as: SINGULAIR TAKE ONE TABLET DAILY AT BEDTIME   pravastatin 80 MG tablet Commonly known as: PRAVACHOL TAKE ONE (1) TABLET EACH DAY   sildenafil 20 MG tablet Commonly known as: REVATIO TAKE 1 TO 3 TABLETS DAILY AS NEEDED   Spacer/Aero-Holding Dorise Bullion Use with inhaler daily   tamsulosin 0.4 MG Caps capsule Commonly known as: FLOMAX TAKE 1 CAPSULE DAILY AFTER SUPPER   Trulicity 4.96 PR/9.1MB Sopn Generic drug: Dulaglutide Inject 0.75 mg into the skin once a week.        Follow Up Appointments:  Follow-up Information     Lajuana Matte, MD Follow up on 08/31/2020.   Specialty: Cardiothoracic Surgery Why: This appointment is VIRTUAL;do not go to office. Appointment time is at 3:40 pm Contact information: [place] [address] [phone]                 Signed: Sharalyn Ink [facility] 08/29/2020, 8:30 AM

## 2020-08-27 NOTE — Progress Notes (Signed)
Called and made Dr. Roanna Banning aware that patient unable to urinate and provide sample for U/A.

## 2020-08-27 NOTE — Op Note (Signed)
[address]       [place] [address]             [phone]        08/27/2020  Patient:  DAVI UMANSKY Pre-Op Dx: Recurrent hiatal hernia   Gastroesophageal reflux disease   Shortness of breath.   Chronic cough Post-op Dx: Same Procedure: - Esophagoscopy - Robotic assisted laparoscopy -Extensive lysis of adhesions -Repair of recurrent hiatal hernia with ACell mesh -Takedown of previous Nissen fundoplication -Dor fundoplication   Surgeon and Role:      * Troi Florendo, Lucile Crater, MD - Primary    *D. Tacy Dura, PA-C- assisting  Anesthesia  general EBL: 50 ml Blood Administration: None Specimen: None   Counts: correct   Indications: 60 year old male referred for surgical evaluation of chronic shortness of breath and a cough.  He had undergone a hiatal hernia repair with Nissen fundoplication over 30 years ago and on cross-sectional imaging he had a recurrence of this with a moderate size hiatal hernia.  He also complained of some reflux and due to to his previous bouts prior to his original surgery he wanted to be proactive in treating this.  Findings: Extensive adhesions at the level of the hiatus.  It appears as though the Nissen wrap was tacked to the left diaphragmatic crus.  The Nissen it also slipped and was encasing the stomach.  We were able to mobilize the esophagus out of the hiatus and achieve good intra-abdominal length.  After taking down the previous Nissen we then performed a Dor fundoplication.  Operative Technique: After the risks, benefits and alternatives were thoroughly discussed, the patient was brought to the operative theatre.  Anesthesia was induced, and the esophagoscope was passed through the oropharynx down to the stomach.  The scope was retroflexed and the hiatal hernia was clearly evident.  The scope was pulled back and the mucosal surface of the esophagus was visualized.    The esophagus was tortuous at the level of 32 cm.   This obviously was the previous wrap.  It was difficult to transverse this.  The scope was then parked at 25 cm from the incisors.  The patient was then prepped and draped in normal sterile fashion.  An appropriate surgical pause was performed, and pre-operative antibiotics were dosed accordingly.  We began with a 1 cm incision 15 cm caudad from the xiphoid and slightly lateral to the umbilicus.  Using an Optiview we entered the peritoneal space.  The abdomen was then insufflated with CO2.  3 other robotic ports were placed to triangulate the hiatus.  Another 12 mm port was placed in place at the level of the umbilicus laterally for an assistant port and another 5 mm trocar was placed in the right lower quadrant for liver retractor.  The patient was then placed in steep reverse Trendelenburg and the liver was elevated to expose the esophageal hiatus.  And then the robot was docked.  Adhesions were lysed and remain away towards the right crus.  Once we are mobilized into the hiatus.  We continued to pull down the stomach into the abdominal cavity.  The previous Nissen wrap was densely adherent to the left crus.  This was taken down with great care.  There were sutures attached between it and the stomach.  Once we had circumferentially mobilized the hiatus and achieved 3 to 4 cm intra-abdominal length it was evident that the previous Nissen fundoplication had slipped off of  the esophagus and was over the stomach.  Using a robotic stapler we then took down the previous Nissen wrap.  We then proceeded to reapproximate the crura with 0 Ethibond sutures in an interrupted fashion.  We used ACell mesh as a buttress for the crural reapproximation.  A U-shaped patch was then placed over the hiatus.  The gastroscope was passed down through the lower esophageal sphincter into the stomach and would act as our bougie during this repair.  Next the stomach was passed anterior to the esophagus and dor fundoplication was  performed.  An air leak test was performed using the gastroscope.  No leak was evident.  The liver retractor was removed and all ports were removed under direct visualization.  The skin and soft tissue were closed with absorbable suture    The patient tolerated the procedure without any immediate complications, and was transferred to the PACU in stable condition.  Kim Oki Bary Leriche

## 2020-08-27 NOTE — Hospital Course (Addendum)
HPI: This is a 60 y.o. male referred for surgical evaluation of a recurrent moderate size hiatal hernia.  He was previously seen by pulmonary medicine for chronic cough.  This occurred after he had COVID in 2020.  His cough has been nonproductive but persistent for the last 2 years.  In regards to his reflux, he does have some mild reflux symptoms, but this is much better than what he had over 30 years ago at his original operation.  He denies any dysphagia.  He does have concerns that his reflux will get worse if the hiatal hernia is not addressed. We had a long discussion about the source of his cough and Dr. Kipp Brood cannot say definitively that it is related to reflux and potential microaspiration.  On review of images dating back to 2009, it appears as though the hiatal hernia has been present since that point, but he only has had this chronic cough since 2022 after having COVID.  He is concerned that with the recurrence his reflux will suddenly worsen.  Dr. Kipp Brood explained to him that since its been present for 13 years that is unlikely; however, he would like this hernia fixed. Dr. Kipp Brood discussed potential risks, complications, and benefits of undergoing an upper endoscopy and robotic assisted redo hiatal hernia repair. He agreed to proceed with surgery.  Hospital Course: Patient was extubated and taken to PACU in stable condition. He had complaints of left shoulder pain post op. He was given pain medication and a K pad. He remained afebrile and hemodynamically stable. Esophagram was done on 08/23. Results showed no evidence of a contrast leak, the GE junction is below the diaphragm, and moderate to severe esophageal dysmotility. Left pigtail chest tube was to water seal and there was no air leak. Chest x ray showed stable left apical pneumothorax. Pigtail chest tube was removed. Follow up chest x ray showed **.

## 2020-08-27 NOTE — Transfer of Care (Signed)
Immediate Anesthesia Transfer of Care Note  Patient: Johnny Mathews  Procedure(s) Performed: XI ROBOTIC ASSISTED REDO HIATAL HERNIA REPAIR (Chest) ESOPHAGOGASTRODUODENOSCOPY (EGD) INSERTION OF ACELL 7.5 x 6cm GENTRIX SURGICAL MATRIX HIATAL MESH  Patient Location: PACU  Anesthesia Type:General  Level of Consciousness: awake, alert  and oriented  Airway & Oxygen Therapy: Patient Spontanous Breathing and Patient connected to nasal cannula oxygen  Post-op Assessment: Report given to RN, Post -op Vital signs reviewed and stable and Patient moving all extremities  Post vital signs: Reviewed and stable  Last Vitals:  Vitals Value Taken Time  BP 102/59 08/27/20 1250  Temp    Pulse 77 08/27/20 1252  Resp 18 08/27/20 1252  SpO2 97 % 08/27/20 1252  Vitals shown include unvalidated device data.  Last Pain:  Vitals:   08/27/20 0651  TempSrc:   PainSc: 0-No pain         Complications: No notable events documented.

## 2020-08-27 NOTE — Anesthesia Procedure Notes (Signed)
Procedure Name: Intubation Date/Time: 08/27/2020 8:04 AM Performed by: Leonor Liv, CRNA Pre-anesthesia Checklist: Patient identified, Emergency Drugs available, Suction available and Patient being monitored Patient Re-evaluated:Patient Re-evaluated prior to induction Oxygen Delivery Method: Circle System Utilized Preoxygenation: Pre-oxygenation with 100% oxygen Induction Type: IV induction Ventilation: Mask ventilation without difficulty and Oral airway inserted - appropriate to patient size Laryngoscope Size: Mac and 4 Grade View: Grade I Tube type: Oral Tube size: 7.5 mm Number of attempts: 1 Airway Equipment and Method: Stylet and Oral airway Placement Confirmation: ETT inserted through vocal cords under direct vision, positive ETCO2 and breath sounds checked- equal and bilateral Secured at: 23 cm Tube secured with: Tape Dental Injury: Teeth and Oropharynx as per pre-operative assessment

## 2020-08-28 ENCOUNTER — Encounter (HOSPITAL_COMMUNITY): Payer: Self-pay | Admitting: Thoracic Surgery (Cardiothoracic Vascular Surgery)

## 2020-08-28 ENCOUNTER — Inpatient Hospital Stay (HOSPITAL_COMMUNITY): Payer: BC Managed Care – PPO

## 2020-08-28 LAB — BASIC METABOLIC PANEL
Anion gap: 7 (ref 5–15)
BUN: 17 mg/dL (ref 8–23)
CO2: 26 mmol/L (ref 22–32)
Calcium: 9 mg/dL (ref 8.9–10.3)
Chloride: 106 mmol/L (ref 98–111)
Creatinine, Ser: 1.19 mg/dL (ref 0.61–1.24)
GFR, Estimated: >60 mL/min (ref >60)
Glucose, Bld: 88 mg/dL (ref 70–99)
Potassium: 3.8 mmol/L (ref 3.5–5.1)
Sodium: 139 mmol/L (ref 135–145)

## 2020-08-28 LAB — GLUCOSE, CAPILLARY
Glucose-Capillary: 104 mg/dL — ABNORMAL HIGH (ref 70–99)
Glucose-Capillary: 72 mg/dL (ref 70–99)
Glucose-Capillary: 74 mg/dL (ref 70–99)
Glucose-Capillary: 95 mg/dL (ref 70–99)
Glucose-Capillary: 84 mg/dL (ref 70–99)

## 2020-08-28 LAB — CBC
HCT: 35 % — ABNORMAL LOW (ref 39.0–52.0)
Hemoglobin: 11.3 g/dL — ABNORMAL LOW (ref 13.0–17.0)
MCH: 32.2 pg (ref 26.0–34.0)
MCHC: 32.3 g/dL (ref 30.0–36.0)
MCV: 99.7 fL (ref 80.0–100.0)
Platelets: 194 10*3/uL (ref 150–400)
RBC: 3.51 MIL/uL — ABNORMAL LOW (ref 4.22–5.81)
RDW: 13 % (ref 11.5–15.5)
WBC: 6.6 10*3/uL (ref 4.0–10.5)
nRBC: 0 % (ref 0.0–0.2)

## 2020-08-28 MED ORDER — ENOXAPARIN SODIUM 40 MG/0.4ML IJ SOSY
40.0000 mg | PREFILLED_SYRINGE | INTRAMUSCULAR | Status: DC
  Administered 2020-08-28 – 2020-08-29 (×2): 40 mg via SUBCUTANEOUS
  Filled 2020-08-28 (×2): qty 0.4

## 2020-08-28 MED ORDER — DIATRIZOATE MEGLUMINE & SODIUM 66-10 % PO SOLN
120.0000 mL | Freq: Once | ORAL | Status: AC
  Administered 2020-08-28: 100 mL via ORAL
  Filled 2020-08-28: qty 120

## 2020-08-28 MED ORDER — TAMSULOSIN HCL 0.4 MG PO CAPS
0.4000 mg | ORAL_CAPSULE | Freq: Every day | ORAL | Status: DC
  Administered 2020-08-28 – 2020-08-29 (×2): 0.4 mg via ORAL
  Filled 2020-08-28 (×2): qty 1

## 2020-08-28 MED ORDER — PANTOPRAZOLE SODIUM 40 MG IV SOLR
40.0000 mg | Freq: Two times a day (BID) | INTRAVENOUS | Status: DC
  Administered 2020-08-28 – 2020-08-29 (×3): 40 mg via INTRAVENOUS
  Filled 2020-08-28 (×3): qty 40

## 2020-08-28 MED ORDER — MORPHINE SULFATE (PF) 2 MG/ML IV SOLN: 1.0000 mg | INTRAVENOUS | Status: DC | PRN

## 2020-08-28 MED ORDER — BUDESON-GLYCOPYRROL-FORMOTEROL 160-9-4.8 MCG/ACT IN AERO: 2.0000 | INHALATION_SPRAY | Freq: Two times a day (BID) | RESPIRATORY_TRACT | Status: DC

## 2020-08-28 MED ORDER — INSULIN ASPART 100 UNIT/ML IJ SOLN: 0.0000 [IU] | INTRAMUSCULAR | Status: DC

## 2020-08-28 MED ORDER — HYDROCODONE-ACETAMINOPHEN 7.5-325 MG/15ML PO SOLN
10.0000 mL | Freq: Four times a day (QID) | ORAL | Status: DC | PRN
  Administered 2020-08-28: 10 mL via ORAL
  Filled 2020-08-28: qty 15

## 2020-08-28 MED ORDER — AMITRIPTYLINE HCL 50 MG PO TABS
25.0000 mg | ORAL_TABLET | Freq: Every day | ORAL | Status: DC
  Administered 2020-08-28: 25 mg via ORAL
  Filled 2020-08-28: qty 1

## 2020-08-28 MED ORDER — MOMETASONE FURO-FORMOTEROL FUM 200-5 MCG/ACT IN AERO
2.0000 | INHALATION_SPRAY | Freq: Two times a day (BID) | RESPIRATORY_TRACT | Status: DC
  Administered 2020-08-28 – 2020-08-29 (×3): 2 via RESPIRATORY_TRACT
  Filled 2020-08-28: qty 8.8

## 2020-08-28 MED ORDER — ALBUTEROL SULFATE (2.5 MG/3ML) 0.083% IN NEBU: 2.5000 mg | INHALATION_SOLUTION | Freq: Four times a day (QID) | RESPIRATORY_TRACT | Status: DC | PRN

## 2020-08-28 MED ORDER — UMECLIDINIUM BROMIDE 62.5 MCG/INH IN AEPB
1.0000 | INHALATION_SPRAY | Freq: Every day | RESPIRATORY_TRACT | Status: DC
  Administered 2020-08-28 – 2020-08-29 (×2): 1 via RESPIRATORY_TRACT
  Filled 2020-08-28: qty 7

## 2020-08-28 MED ORDER — ONDANSETRON HCL 4 MG/2ML IJ SOLN
4.0000 mg | Freq: Four times a day (QID) | INTRAMUSCULAR | Status: DC
  Administered 2020-08-28 – 2020-08-29 (×4): 4 mg via INTRAVENOUS
  Filled 2020-08-28 (×4): qty 2

## 2020-08-28 MED ORDER — FLUTICASONE PROPIONATE 50 MCG/ACT NA SUSP
1.0000 | Freq: Every day | NASAL | Status: DC
  Filled 2020-08-28: qty 16

## 2020-08-28 NOTE — Plan of Care (Signed)
  Problem: Nutrition: Goal: Adequate nutrition will be maintained Outcome: Not Progressing   Problem: Elimination: Goal: Will not experience complications related to urinary retention Outcome: Not Progressing

## 2020-08-28 NOTE — Anesthesia Postprocedure Evaluation (Signed)
Anesthesia Post Note  Patient: QUASHUN BOMER  Procedure(s) Performed: XI ROBOTIC ASSISTED REDO HIATAL HERNIA REPAIR (Chest) ESOPHAGOGASTRODUODENOSCOPY (EGD) INSERTION OF ACELL 7.5 x 6cm Catarina MESH     Patient location during evaluation: PACU Anesthesia Type: General Level of consciousness: awake Pain management: pain level controlled Vital Signs Assessment: post-procedure vital signs reviewed and stable Respiratory status: spontaneous breathing, nonlabored ventilation, respiratory function stable and patient connected to nasal cannula oxygen Cardiovascular status: blood pressure returned to baseline and stable Postop Assessment: no apparent nausea or vomiting Anesthetic complications: no   No notable events documented.  Last Vitals:  Vitals:   08/27/20 2338 08/28/20 0339  BP: 113/71 112/63  Pulse: (!) 58 60  Resp: 11 12  Temp: 36.5 C 36.5 C  SpO2: 99% 99%    Last Pain:  Vitals:   08/28/20 0339  TempSrc: Oral  PainSc: 0-No pain                 Maurita Havener P Jayliana Valencia

## 2020-08-28 NOTE — Progress Notes (Signed)
Patient without void this shift. Bladder scan yielded 484m. Patient dangled on the side of the bed in an effort to use the urinal. Attempted for >30 minutes. Dr. LKipp Broodpaged via pager number available in [place] Awaiting return call.

## 2020-08-28 NOTE — Discharge Instructions (Signed)
[facility]-Assisted Thoracic Surgery, Care After The following information offers guidance on how to care for yourself after your procedure. Your health care provider may also give you more specific instructions. If you have problems or questions, contact your health careprovider. What can I expect after the procedure? After the procedure, it is common to have: Some pain and aches in the area of your surgical incisions. Pain when breathing in (inhaling) and coughing. Tiredness (fatigue). Trouble sleeping. Constipation. Follow these instructions at home: Medicines Take over-the-counter and prescription medicines only as told by your health care provider. If you were prescribed an antibiotic medicine, take it as told by your health care provider. Do not stop taking the antibiotic even if you start to feel better. Talk with your health care provider about safe and effective ways to manage pain after your procedure. Pain management should fit your specific health needs. Take pain medicine before pain becomes severe. Relieving and controlling your pain will make breathing easier for you. Ask your health care provider if the medicine prescribed to you requires you to avoid driving or using machinery. Eating and drinking Follow instructions from your health care provider about eating or drinking restrictions. These will vary depending on what procedure you had. Your health care provider may recommend: A liquid diet or soft diet for the first few days. Meals that are smaller and more frequent. A diet of fruits, vegetables, whole grains, and low-fat proteins. Limiting foods that are high in fat and processed sugar, including fried or sweet foods. Incision care Follow instructions from your health care provider about how to take care of your incisions. Make sure you: Wash your hands with soap and water for at least 20 seconds before and after you change your bandage (dressing). If soap and water are not  available, use hand sanitizer. Change your dressing as told by your health care provider. Leave stitches (sutures), skin glue, or adhesive strips in place. These skin closures may need to stay in place for 2 weeks or longer. If adhesive strip edges start to loosen and curl up, you may trim the loose edges. Do not remove adhesive strips completely unless your health care provider tells you to do that. Check your incision area every day for signs of infection. Check for: Redness, swelling, or more pain. Fluid or blood. Warmth. Pus or a bad smell. Activity Return to your normal activities as told by your health care provider. Ask your health care provider what activities are safe for you. Ask your health care provider when it is safe for you to drive. Do not lift anything that is heavier than 10 lb (4.5 kg), or the limit that you are told, until your health care provider says that it is safe. Rest as told by your health care provider. Avoid sitting for a long time without moving. Get up to take short walks every 1-2 hours. This is important to improve blood flow and breathing. Ask for help if you feel weak or unsteady. Do exercises as told by your health care provider. Pneumonia prevention Do deep breathing exercises and cough regularly as directed. This helps clear mucus and opens your lungs. Doing this helps prevent lung infection (pneumonia). If you were given an incentive spirometer, use it as told. An incentive spirometer is a tool that measures how well you are filling your lungs with each breath. Coughing may hurt less if you try to support your chest. This is called splinting. Try one of these when you cough: Hold  a pillow against your chest. Place the palms of both hands on top of your incision area. Do not use any products that contain nicotine or tobacco. These products include cigarettes, chewing tobacco, and vaping devices, such as e-cigarettes. If you need help quitting, ask your  health care provider. Avoid secondhand smoke.   General instructions If you have a drainage tube: Follow instructions from your health care provider about how to take care of it. Do not travel by airplane after your tube is removed until your health care provider tells you it is safe. You may need to take these actions to prevent or treat constipation: Drink enough fluid to keep your urine pale yellow. Take over-the-counter or prescription medicines. Eat foods that are high in fiber, such as beans, whole grains, and fresh fruits and vegetables. Limit foods that are high in fat and processed sugars, such as fried or sweet foods. Keep all follow-up visits. This is important. Contact a health care provider if: You have redness, swelling, or more pain around an incision. You have fluid or blood coming from an incision. An incision feels warm to the touch. You have pus or a bad smell coming from an incision. You have a fever. You cannot eat or drink without vomiting. Your pain medicine is not controlling your pain. Get help right away if: You have chest pain. Your heart is beating quickly. You have trouble breathing. You have trouble speaking. You are confused. You feel weak or dizzy, or you faint. These symptoms may represent a serious problem that is an emergency. Do not wait to see if the symptoms will go away. Get medical help right away. Call your local emergency services (911 in the [place] Do not drive yourself to the hospital. Summary Talk with your health care provider about safe and effective ways to manage pain after your procedure. Pain management should fit your specific health needs. Return to your normal activities as told by your health care provider. Ask your health care provider what activities are safe for you. Do deep breathing exercises and cough regularly as directed. This helps to clear mucus and prevent pneumonia. If it hurts to cough, ease pain by holding a pillow  against your chest or by placing the palms of both hands over your incisions. This information is not intended to replace advice given to you by your health care provider. Make sure you discuss any questions you have with your healthcare provider. Document Revised: 09/16/2019 Document Reviewed: 09/16/2019 Elsevier Patient Education  2022 [employer].

## 2020-08-28 NOTE — Progress Notes (Addendum)
      [address]       [place] [address]             [phone]       1 Day Post-Op Procedure(s) (LRB): XI ROBOTIC ASSISTED REDO HIATAL HERNIA REPAIR (N/A) ESOPHAGOGASTRODUODENOSCOPY (EGD) (N/A) INSERTION OF ACELL 7.5 x 6cm GENTRIX SURGICAL MATRIX HIATAL MESH (N/A)  Subjective: Patient has had left shoulder pain since surgery. He denies abdominal pain or nausea.  Objective: Vital signs in last 24 hours: Temp:  [97 F (36.1 C)-98 F (36.7 C)] 97.7 F (36.5 C) (08/23 0339) Pulse Rate:  [57-72] 60 (08/23 0339) Cardiac Rhythm: Normal sinus rhythm (08/23 0704) Resp:  [11-22] 12 (08/23 0339) BP: (102-121)/(57-71) 112/63 (08/23 0339) SpO2:  [96 %-99 %] 99 % (08/23 0339)     Intake/Output from previous day: 08/22 0701 - 08/23 0700 In: 2100 [I.V.:1900; IV Piggyback:200] Out: 1051 [Urine:950; Blood:25; Chest Tube:76]   Physical Exam:  Cardiovascular: RRR Pulmonary: Clear to auscultation bilaterally Abdomen: Soft, non tender, bowel sounds present. Extremities: SCDs in place Wounds: Clean and dry.  No erythema or signs of infection. Chest Tube: to water seal, no air leak  Lab Results: CBC: Recent Labs    08/27/20 1300 08/28/20 0618  WBC 8.2 6.6  HGB 10.6* 11.3*  HCT 32.8* 35.0*  PLT 200 194   BMET:  Recent Labs    08/27/20 1300  NA 140  K 4.3  CL 106  CO2 26  GLUCOSE 155*  BUN 16  CREATININE 1.17  CALCIUM 8.7*    PT/INR: No results for input(s): LABPROT, INR in the last 72 hours. ABG:  INR: Will add last result for INR, ABG once components are confirmed Will add last 4 CBG results once components are confirmed  Assessment/Plan:  1. CV - SR with HR in the 60's 2.  Pulmonary - On room air. Chest tube is to water seal, there is no air leak. Chest tube with 76 cc since surgery. CXR this am appears to show small left apical pneumothorax. Encourage incentive spirometer 3. H and H this am stable at 11.3 and 35 4. History of BPH-He had  to be in and out cath'd last night. He was on Flomax prior to admission. Once esophagram done and results known, will restart. 5. GI-NPO. Esophagram ordered.  Greenly Rarick M ZimmermanPA-C 08/28/2020,7:13 AM [phone]

## 2020-08-29 ENCOUNTER — Inpatient Hospital Stay (HOSPITAL_COMMUNITY): Payer: BC Managed Care – PPO

## 2020-08-29 LAB — GLUCOSE, CAPILLARY
Glucose-Capillary: 87 mg/dL (ref 70–99)
Glucose-Capillary: 88 mg/dL (ref 70–99)

## 2020-08-29 MED ORDER — LOSARTAN POTASSIUM 50 MG PO TABS: 50.0000 mg | ORAL_TABLET | Freq: Every day | ORAL | 3 refills | Status: DC

## 2020-08-29 MED ORDER — MELOXICAM 7.5 MG PO TABS: ORAL_TABLET | ORAL | 3 refills | Status: DC

## 2020-08-29 MED ORDER — HYDROCODONE-ACETAMINOPHEN 7.5-325 MG/15ML PO SOLN: 10.0000 mL | Freq: Four times a day (QID) | ORAL | 0 refills | Status: DC | PRN

## 2020-08-29 NOTE — Progress Notes (Signed)
Pt to Xray.

## 2020-08-29 NOTE — Progress Notes (Addendum)
      [address]       [place] [address]             [phone]       2 Days Post-Op Procedure(s) (LRB): XI ROBOTIC ASSISTED REDO HIATAL HERNIA REPAIR (N/A) ESOPHAGOGASTRODUODENOSCOPY (EGD) (N/A) INSERTION OF ACELL 7.5 x 6cm GENTRIX SURGICAL MATRIX HIATAL MESH (N/A)  Subjective: Patient tolerating diet. He denies nausea or vomiting.  Objective: Vital signs in last 24 hours: Temp:  [98 F (36.7 C)-98.6 F (37 C)] 98.5 F (36.9 C) (08/24 0300) Pulse Rate:  [59-76] 76 (08/24 0300) Cardiac Rhythm: Normal sinus rhythm (08/24 0705) Resp:  [13-22] 15 (08/24 0300) BP: (108-134)/(68-88) 123/70 (08/24 0300) SpO2:  [94 %-99 %] 94 % (08/24 0300)     Intake/Output from previous day: 08/23 0701 - 08/24 0700 In: 240 [P.O.:240] Out: 5 [Chest Tube:5]   Physical Exam:  Cardiovascular: RRR Pulmonary: Clear to auscultation bilaterally Abdomen: Soft, non tender, bowel sounds present. Extremities: No LE edema Wounds: Clean and dry.  No erythema or signs of infection.   Lab Results: CBC: Recent Labs    08/27/20 1300 08/28/20 0618  WBC 8.2 6.6  HGB 10.6* 11.3*  HCT 32.8* 35.0*  PLT 200 194    BMET:  Recent Labs    08/27/20 1300 08/28/20 0618  NA 140 139  K 4.3 3.8  CL 106 106  CO2 26 26  GLUCOSE 155* 88  BUN 16 17  CREATININE 1.17 1.19  CALCIUM 8.7* 9.0     PT/INR: No results for input(s): LABPROT, INR in the last 72 hours. ABG:  INR: Will add last result for INR, ABG once components are confirmed Will add last 4 CBG results once components are confirmed  Assessment/Plan:  1. CV - SR with HR in the 60's. Will restart Toprol XL 25 mg daily and Losartan. 2.  Pulmonary - On room air. CXR this am appears stable (? trace left apical pneumothorax). Encourage incentive spirometer 3. Last H and H stable at 11.3 and 35 4. History of BPH-He had to be in and out cath'd  after surgery. He was on Flomax prior to admission., which was restarted. Will  resume Proscar at discharge 5. GI-tolerating a  dysphagia I diet. 6. CBGs 84/88/87. No history of diabetes. Will stop accu checks. 7. As discussed with Dr. Kipp Brood, will discharge  [facility] 08/29/2020,7:21 AM [phone]

## 2020-08-29 NOTE — Plan of Care (Signed)

## 2020-08-30 ENCOUNTER — Other Ambulatory Visit: Payer: Self-pay | Admitting: Family Medicine

## 2020-08-31 ENCOUNTER — Other Ambulatory Visit: Payer: Self-pay

## 2020-08-31 ENCOUNTER — Encounter: Payer: Self-pay | Admitting: Thoracic Surgery (Cardiothoracic Vascular Surgery)

## 2020-08-31 ENCOUNTER — Telehealth (INDEPENDENT_AMBULATORY_CARE_PROVIDER_SITE_OTHER): Payer: Self-pay | Admitting: Thoracic Surgery (Cardiothoracic Vascular Surgery)

## 2020-08-31 DIAGNOSIS — K449 Diaphragmatic hernia without obstruction or gangrene: Secondary | ICD-10-CM

## 2020-08-31 NOTE — Progress Notes (Signed)
     [address]       [place] [address]             [phone]       Patient: Home Provider: Office Consent for Telemedicine visit obtained.  Today's visit was completed via a real-time telehealth (see specific modality noted below). The patient/authorized person provided oral consent at the time of the visit to engage in a telemedicine encounter with the present provider at [facility]. The patient/authorized person was informed of the potential benefits, limitations, and risks of telemedicine. The patient/authorized person expressed understanding that the laws that protect confidentiality also apply to telemedicine. The patient/authorized person acknowledged understanding that telemedicine does not provide emergency services and that he or she would need to call 911 or proceed to the nearest hospital for help if such a need arose.   Total time spent in the clinical discussion 10 minutes.  Telehealth Modality: Phone visit (audio only)  I had a telephone visit with Mr. Whitted.  He is status post redo hiatal hernia repair.  He states that he is doing much better.  He has not been able to breathe as comfortably for several months.  He denies any reflux.  He denies any dysphagia.  He is tolerating soft food as well.  I will see him back in 1 month with a chest x-ray.

## 2020-09-07 ENCOUNTER — Telehealth: Payer: BC Managed Care – PPO | Admitting: Thoracic Surgery (Cardiothoracic Vascular Surgery)

## 2020-09-23 ENCOUNTER — Other Ambulatory Visit: Payer: Self-pay | Admitting: Family Medicine

## 2020-09-26 ENCOUNTER — Other Ambulatory Visit: Payer: Self-pay | Admitting: Thoracic Surgery (Cardiothoracic Vascular Surgery)

## 2020-09-26 ENCOUNTER — Other Ambulatory Visit: Payer: Self-pay

## 2020-09-26 ENCOUNTER — Ambulatory Visit
Admission: RE | Admit: 2020-09-26 | Discharge: 2020-09-26 | Disposition: A | Discharge status: Home / Self Care | Payer: BC Managed Care – PPO | Source: Ambulatory Visit | Attending: Thoracic Surgery (Cardiothoracic Vascular Surgery) | Admitting: Thoracic Surgery (Cardiothoracic Vascular Surgery)

## 2020-09-26 DIAGNOSIS — K449 Diaphragmatic hernia without obstruction or gangrene: Secondary | ICD-10-CM

## 2020-09-26 DIAGNOSIS — Z8719 Personal history of other diseases of the digestive system: Secondary | ICD-10-CM

## 2020-09-27 ENCOUNTER — Ambulatory Visit (INDEPENDENT_AMBULATORY_CARE_PROVIDER_SITE_OTHER): Payer: Self-pay | Admitting: Physician Assistant

## 2020-09-27 ENCOUNTER — Encounter: Payer: Self-pay | Admitting: Physician Assistant

## 2020-09-27 VITALS — BP 160/88 | HR 64 | Resp 20 | Ht 68.5 in | Wt 192.0 lb

## 2020-09-27 DIAGNOSIS — Z8719 Personal history of other diseases of the digestive system: Secondary | ICD-10-CM

## 2020-09-27 DIAGNOSIS — Z9889 Other specified postprocedural states: Secondary | ICD-10-CM

## 2020-09-27 DIAGNOSIS — K449 Diaphragmatic hernia without obstruction or gangrene: Secondary | ICD-10-CM

## 2020-09-27 NOTE — Progress Notes (Signed)
[address]       [place] [address]             [phone]      Johnny Mathews is a 60 y.o. male patient who is s/p hiatal hernia repair on 8/22 with Dr. Kipp Brood. He was tolerating a dysphagia 1 diet for he left the hospital. He was taking medication with applesauce without issue when he left the hospital.   He is having issues with dysphagia when he drinks water fast. With some solid foods he feels like its stays in his throat and sometimes he needs to throw-up.   Will go back to a soft food diet until we can get him back to see Dr. Kipp Brood. I encouraged him to eat slowly and eat small meals throughout the day. Drink water slowly and do not drink a whole bottle at once.    No diagnosis found. Past Medical History:  Diagnosis Date   Allergy    Alpha galactosidase deficiency    Anemia    past hx    Arthritis    Asthma    Blood transfusion without reported diagnosis    BPH (benign prostatic hypertrophy)    Cataract    removed left eye    Complication of anesthesia    Diabetes mellitus without complication (HCC)    GERD (gastroesophageal reflux disease)    History of kidney stones    HOH (hard of hearing)    Hyperlipidemia    Hypertension    Irregular heart beat    Muscular dystrophy ([place])    PONV (postoperative nausea and vomiting)    Wears hearing aid in both ears    No past surgical history pertinent negatives on file. Scheduled Meds: Current Outpatient Medications on File Prior to Visit  Medication Sig Dispense Refill   albuterol (PROVENTIL) (2.5 MG/3ML) 0.083% nebulizer solution NEBULIZE 1 VIAL EVERY 6 HOURS AS NEEDED FOR WHEEZING & SHORTNESS OF BREATH 150 mL 1   albuterol (VENTOLIN HFA) 108 (90 Base) MCG/ACT inhaler Inhale 2 puffs into the lungs every 4 (four) hours as needed. (Needs to be seen before next refill) 18 g 11   amitriptyline (ELAVIL) 100 MG tablet Take 1 tablet (100 mg total) by mouth at bedtime. 90 tablet 3   azelastine  (ASTELIN) 0.1 % nasal spray USE 1 SPRAY IN EACH NOSTRIL TWICE DAILY AS NEEDED (Patient taking differently: Place 1 spray into both nostrils at bedtime.) 30 mL 11   Blood Glucose Monitoring Suppl (CONTOUR BLOOD GLUCOSE SYSTEM) w/Device KIT Test blood sugars four times daily 200 each 5   Budeson-Glycopyrrol-Formoterol (BREZTRI AEROSPHERE) 160-9-4.8 MCG/ACT AERO Inhale 2 puffs into the lungs in the morning and at bedtime. 10.7 g 6   Dulaglutide (TRULICITY) 6.37 CH/8.8FO SOPN Inject 0.75 mg into the skin once a week. 2 mL 3   EPINEPHrine 0.3 mg/0.3 mL IJ SOAJ injection Inject 0.3 mg into the skin once.     esomeprazole (NEXIUM) 40 MG capsule Take 1 capsule (40 mg total) by mouth daily at 12 noon. (Patient taking differently: Take 40 mg by mouth in the morning.) 90 capsule 3   famotidine (PEPCID) 20 MG tablet Take 1 tablet (20 mg total) by mouth 2 (two) times daily. (Patient taking differently: Take 20 mg by mouth at bedtime.) 180 tablet 3   ferrous sulfate 325 (65 FE) MG tablet Take 325 mg by mouth daily with breakfast.     finasteride (PROSCAR) 5 MG tablet  TAKE ONE (1) TABLET EACH DAY 90 tablet 3   fluticasone (FLONASE) 50 MCG/ACT nasal spray Place 1-2 sprays into both nostrils daily. (Patient taking differently: Place 1-2 sprays into both nostrils at bedtime.) 48 g 11   furosemide (LASIX) 20 MG tablet Take 1 tablet (20 mg total) by mouth daily as needed. (Patient taking differently: Take 20 mg by mouth in the morning.) 90 tablet 3   glucose blood (CONTOUR NEXT TEST) test strip Test blood sugars four times daily 200 each 5   HYDROcodone-acetaminophen (HYCET) 7.5-325 mg/15 ml solution Take 10 mLs by mouth every 6 (six) hours as needed for severe pain. 118 mL 0   losartan (COZAAR) 50 MG tablet Take 1 tablet (50 mg total) by mouth daily. 90 tablet 3   Meclizine HCl 25 MG CHEW TAKE ONE TABLET DAILY AT BEDTIME 90 tablet 0   meloxicam (MOBIC) 7.5 MG tablet TAKE ONE (1) TABLET EACH DAY 90 tablet 3   metoprolol  succinate (TOPROL-XL) 25 MG 24 hr tablet Take 25 mg by mouth in the morning.     montelukast (SINGULAIR) 10 MG tablet TAKE ONE TABLET DAILY AT BEDTIME 90 tablet 1   pravastatin (PRAVACHOL) 80 MG tablet TAKE ONE (1) TABLET EACH DAY 90 tablet 3   sildenafil (REVATIO) 20 MG tablet TAKE 1 TO 3 TABLETS DAILY AS NEEDED 30 tablet 3   Spacer/Aero-Holding Chambers DEVI Use with inhaler daily 1 each 0   tamsulosin (FLOMAX) 0.4 MG CAPS capsule TAKE 1 CAPSULE DAILY AFTER SUPPER 90 capsule 3   No current facility-administered medications on file prior to visit.     Allergies  Allergen Reactions   Eggs Or Egg-Derived Products Swelling    Throat- 03-31-2019 pt states can eat cakes and Pie with no issues  Throat- 03-31-2019 pt states can eat cakes and Pie with no issues    Galactose     Alpha- Gal    Septra [Sulfamethoxazole-Trimethoprim] Swelling    throat   Sulfonamide Derivatives Hives   Active Problems:   * No active hospital problems. *  There were no vitals taken for this visit.   Vitals:   09/27/20 0923  BP: (!) 160/88  Pulse: 64  Resp: 20  SpO2: 09%    Cor: systolic murmur, NSR Pulm: CTA bilaterally and in all fields Abd: + bowel sounds, no tenderness Wound: c/d/I Ext: no edema  CLINICAL DATA:  Hiatal hernia repair. Pain under right pectoralis muscle after eating   EXAM: CHEST - 2 VIEW   COMPARISON:  08/29/2020   FINDINGS: The heart size and mediastinal contours are within normal limits. Both lungs are clear. The visualized skeletal structures are unremarkable.   IMPRESSION: No active cardiopulmonary disease.     Electronically Signed   By: Kerby Moors M.D.   On: 09/26/2020 10:17  Echo: 12/16/2018 Summary 1. The left ventricle is normal in size with mildly increased wall thickness. 2. Normal left ventricular systolic function, ejection fraction > 55%. 3. Diastolic dysfunction - grade I (normal filling pressures). 4. Normal right ventricular size and systolic  function. 5. Dilated left atrium - mildly to moderately dilated. 6. Aortic stenosis - mild.  Assessment & Plan  Insomnia- tried melatonin and it does not work for him. Benadryl does work for him.  Dysphagia-will decrease to soft diet since he is vomiting occasionally Weight has been pretty stable.  Afib on his iwatch, Cardiologist in [place] wrote him a prescription for metoprolol. Echo from 2020 listed above.  Pain under  his right peck, there is gas present on CXR which is the likely culprit.   Plan: He will need to come back and see Dr. Kipp Brood due to his dysphagia and vomiting. I encouraged him to eat slowly, small meals, drink small sips of water. Only soft foods for now until he is tolerating. Also, follow-up with cardiology in [place]. He will need an Echocardiogram.   Elgie Collard 09/27/2020

## 2020-09-28 ENCOUNTER — Ambulatory Visit: Payer: BC Managed Care – PPO | Admitting: Thoracic Surgery (Cardiothoracic Vascular Surgery)

## 2020-10-01 ENCOUNTER — Other Ambulatory Visit: Payer: Self-pay | Admitting: Thoracic Surgery (Cardiothoracic Vascular Surgery)

## 2020-10-01 DIAGNOSIS — R2231 Localized swelling, mass and lump, right upper limb: Secondary | ICD-10-CM

## 2020-10-03 ENCOUNTER — Ambulatory Visit: Payer: Self-pay | Admitting: Thoracic Surgery (Cardiothoracic Vascular Surgery)

## 2020-10-05 ENCOUNTER — Ambulatory Visit: Payer: Self-pay | Admitting: Thoracic Surgery (Cardiothoracic Vascular Surgery)

## 2020-10-06 ENCOUNTER — Other Ambulatory Visit: Payer: Self-pay | Admitting: Family Medicine

## 2020-10-08 ENCOUNTER — Encounter: Payer: Self-pay | Admitting: Thoracic Surgery (Cardiothoracic Vascular Surgery)

## 2020-10-18 MED ORDER — BENZONATATE 100 MG PO CAPS: 100.0000 mg | ORAL_CAPSULE | Freq: Four times a day (QID) | ORAL | 1 refills | Status: DC | PRN

## 2020-10-18 NOTE — Telephone Encounter (Signed)
Medication order placed nothing further needed at this time.

## 2020-10-18 NOTE — Telephone Encounter (Signed)
Dr. Valeta Harms will you please review and advise on the following My Chart message:   Benzonatate    You  Irish, Piech 45 minutes ago (10:04 AM)   Kermit Balo morning Shanon Brow,  By any chance do you recall the name of the medication?       Brien Few [facility] (supporting Icard, Octavio Graves, DO) 2 hours ago (8:42 AM)     I had my hiatal hernia repaired the 22nd of August. I can breathe a lot better now, but the cough is still persistent, and can't get it to go away. Will cough over a two hour period .  [facility] prescribed some pills that actually helped with that matter, but don't have anymore, and don't know if they are safe to take over a long period.

## 2020-10-22 ENCOUNTER — Other Ambulatory Visit: Payer: Self-pay | Admitting: Family Medicine

## 2020-10-22 DIAGNOSIS — E785 Hyperlipidemia, unspecified: Secondary | ICD-10-CM

## 2020-11-01 ENCOUNTER — Ambulatory Visit: Payer: BC Managed Care – PPO | Admitting: Family Medicine

## 2020-11-01 ENCOUNTER — Encounter: Payer: Self-pay | Admitting: Family Medicine

## 2020-11-01 ENCOUNTER — Other Ambulatory Visit: Payer: Self-pay

## 2020-11-01 VITALS — BP 125/68 | HR 67 | Temp 97.8°F | Ht 68.5 in | Wt 197.8 lb

## 2020-11-01 DIAGNOSIS — G7100 Muscular dystrophy, unspecified: Secondary | ICD-10-CM | POA: Diagnosis not present

## 2020-11-01 DIAGNOSIS — M791 Myalgia, unspecified site: Secondary | ICD-10-CM | POA: Diagnosis not present

## 2020-11-01 MED ORDER — TIZANIDINE HCL 4 MG PO TABS: 4.0000 mg | ORAL_TABLET | Freq: Four times a day (QID) | ORAL | 1 refills | Status: DC | PRN

## 2020-11-01 NOTE — Progress Notes (Signed)
Subjective:  Patient ID: Johnny Mathews, male    DOB: 10/27/1960  Age: 60 y.o. MRN: [id number]  CC: Spasms   HPI WARNER LADUCA presents for muscle spasms. So bad they shake the bed at night. Onset 1 week ago. Start in the arms. Interfere with writing at work. Constant back pain. Spasms are different. MUch more pronounced, dramatic. Pain is an acche. This is mor of a cramp. Bilateral. Mid back over the spinalis group.   Depression screen [facility] 2/9 11/01/2020 11/01/2020 08/22/2020  Decreased Interest 0 0 0  Down, Depressed, Hopeless 0 0 0  PHQ - 2 Score 0 0 0  Altered sleeping 1 - 0  Tired, decreased energy 1 - 1  Change in appetite 0 - 0  Feeling bad or failure about yourself  0 - 0  Trouble concentrating 1 - 0  Moving slowly or fidgety/restless 0 - 0  Suicidal thoughts 0 - 0  PHQ-9 Score 3 - -  Difficult doing work/chores Not difficult at all - -  Some recent data might be hidden    History [place] has a past medical history of Allergy, Alpha galactosidase deficiency, Anemia, Arthritis, Asthma, Blood transfusion without reported diagnosis, BPH (benign prostatic hypertrophy), Cataract, Complication of anesthesia, Diabetes mellitus without complication ([place]), GERD (gastroesophageal reflux disease), History of kidney stones, HOH (hard of hearing), Hyperlipidemia, Hypertension, Irregular heart beat, Muscular dystrophy ([place]), PONV (postoperative nausea and vomiting), and Wears hearing aid in both ears.   He has a past surgical history that includes Shoulder open rotator cuff repair; Knee surgery (Right); Kidney stone surgery; Mass excision (Right, 08/02/2018); Cataract extraction (Left); Colonoscopy; Femur fracture surgery; Nissen fundoplication; Xi robotic assisted hiatal hernia repair (N/A, 08/27/2020); Esophagogastroduodenoscopy (N/A, 08/27/2020); and Insertion of mesh (N/A, 08/27/2020).   His family history includes Breast cancer in his mother and sister; Heart disease in his father.He  reports that he quit smoking about 26 years ago. His smoking use included cigarettes. He has a 10.00 pack-year smoking history. His smokeless tobacco use includes snuff. He reports current alcohol use of about 2.0 - 3.0 standard drinks per week. He reports that he does not use drugs.    ROS Review of Systems  Constitutional:  Negative for fever.  Respiratory:  Negative for shortness of breath.   Cardiovascular:  Negative for chest pain.  Musculoskeletal:  Positive for back pain and neck pain (chronic with numbness posteriorly).  Skin:  Negative for rash.   Objective:  BP 125/68   Pulse 67   Temp 97.8 F (36.6 C)   Ht 5' 8.5" (1.74 m)   Wt 197 lb 12.8 oz (89.7 kg)   SpO2 100%   BMI 29.64 kg/m   BP Readings from Last 3 Encounters:  11/01/20 125/68  09/27/20 (!) 160/88  08/29/20 (!) 159/88    Wt Readings from Last 3 Encounters:  11/01/20 197 lb 12.8 oz (89.7 kg)  09/27/20 192 lb (87.1 kg)  08/27/20 191 lb (86.6 kg)     Physical Exam Vitals reviewed.  Constitutional:      Appearance: He is well-developed.  HENT:     Head: Normocephalic and atraumatic.     Right Ear: External ear normal.     Left Ear: External ear normal.     Mouth/Throat:     Pharynx: No oropharyngeal exudate or posterior oropharyngeal erythema.  Eyes:     Pupils: Pupils are equal, round, and reactive to light.  Cardiovascular:     Rate and Rhythm: Normal  rate and regular rhythm.  Pulmonary:     Effort: No respiratory distress.     Breath sounds: Normal breath sounds.  Musculoskeletal:        General: Tenderness (lower thoracic spinalis bilaterally) present. Normal range of motion.     Cervical back: Normal range of motion and neck supple.  Neurological:     Mental Status: He is alert and oriented to person, place, and time.      Assessment & Plan:   Dougles was seen today for spasms.  Diagnoses and all orders for this visit:  Myalgia  Muscular dystrophy ([place])  Other orders -      tiZANidine (ZANAFLEX) 4 MG tablet; Take 1 tablet (4 mg total) by mouth every 6 (six) hours as needed for muscle spasms.      I am having [name] start on tiZANidine. I am also having him maintain his EPINEPHrine, glucose blood, Contour Blood Glucose System, albuterol, ferrous sulfate, furosemide, azelastine, metoprolol succinate, esomeprazole, sildenafil, Spacer/Aero-Holding Chambers, albuterol, Breztri Aerosphere, famotidine, amitriptyline, losartan, meloxicam, montelukast, Trulicity, fluticasone, Meclizine HCl, benzonatate, pravastatin, finasteride, and tamsulosin.  Allergies as of 11/01/2020       Reactions   Eggs Or Egg-derived Products Swelling   Throat- 03-31-2019 pt states can eat cakes and Pie with no issues  Throat- 03-31-2019 pt states can eat cakes and Pie with no issues    Galactose    Alpha- Gal    Septra [sulfamethoxazole-trimethoprim] Swelling   throat   Sulfonamide Derivatives Hives        Medication List        Accurate as of November 01, 2020  8:29 AM. If you have any questions, ask your nurse or doctor.          albuterol 108 (90 Base) MCG/ACT inhaler Commonly known as: Ventolin HFA Inhale 2 puffs into the lungs every 4 (four) hours as needed. (Needs to be seen before next refill)   albuterol (2.5 MG/3ML) 0.083% nebulizer solution Commonly known as: PROVENTIL NEBULIZE 1 VIAL EVERY 6 HOURS AS NEEDED FOR WHEEZING & SHORTNESS OF BREATH   amitriptyline 100 MG tablet Commonly known as: ELAVIL Take 1 tablet (100 mg total) by mouth at bedtime.   azelastine 0.1 % nasal spray Commonly known as: ASTELIN USE 1 SPRAY IN EACH NOSTRIL TWICE DAILY AS NEEDED What changed:  how much to take how to take this when to take this additional instructions   benzonatate 100 MG capsule Commonly known as: TESSALON Take 1 capsule (100 mg total) by mouth every 6 (six) hours as needed for cough.   Breztri Aerosphere 160-9-4.8 MCG/ACT Aero Generic drug:  Budeson-Glycopyrrol-Formoterol Inhale 2 puffs into the lungs in the morning and at bedtime.   Contour Blood Glucose System w/Device Kit Test blood sugars four times daily   EPINEPHrine 0.3 mg/0.3 mL Soaj injection Commonly known as: EPI-PEN Inject 0.3 mg into the skin once.   esomeprazole 40 MG capsule Commonly known as: NEXIUM Take 1 capsule (40 mg total) by mouth daily at 12 noon. What changed: when to take this   famotidine 20 MG tablet Commonly known as: PEPCID Take 1 tablet (20 mg total) by mouth 2 (two) times daily. What changed: when to take this   ferrous sulfate 325 (65 FE) MG tablet Take 325 mg by mouth daily with breakfast.   finasteride 5 MG tablet Commonly known as: PROSCAR TAKE ONE (1) TABLET EACH DAY   fluticasone 50 MCG/ACT nasal spray Commonly known as: FLONASE  USE 1 TO 2 SPRAYS IN EACH NOSTRIL DAILY   furosemide 20 MG tablet Commonly known as: LASIX Take 1 tablet (20 mg total) by mouth daily as needed. What changed: when to take this   glucose blood test strip Commonly known as: Contour Next Test Test blood sugars four times daily   losartan 50 MG tablet Commonly known as: COZAAR Take 1 tablet (50 mg total) by mouth daily.   Meclizine HCl 25 MG Chew TAKE ONE TABLET DAILY AT BEDTIME   meloxicam 7.5 MG tablet Commonly known as: MOBIC TAKE ONE (1) TABLET EACH DAY   metoprolol succinate 25 MG 24 hr tablet Commonly known as: TOPROL-XL Take 25 mg by mouth in the morning.   montelukast 10 MG tablet Commonly known as: SINGULAIR TAKE ONE TABLET DAILY AT BEDTIME   pravastatin 80 MG tablet Commonly known as: PRAVACHOL TAKE ONE (1) TABLET EACH DAY   sildenafil 20 MG tablet Commonly known as: REVATIO TAKE 1 TO 3 TABLETS DAILY AS NEEDED   Spacer/Aero-Holding Dorise Bullion Use with inhaler daily   tamsulosin 0.4 MG Caps capsule Commonly known as: FLOMAX TAKE 1 CAPSULE DAILY AFTER SUPPER   tiZANidine 4 MG tablet Commonly known as:  ZANAFLEX Take 1 tablet (4 mg total) by mouth every 6 (six) hours as needed for muscle spasms. Started by: [name], MD   Trulicity 5.82 PP/8.9QM Sopn Generic drug: Dulaglutide Inject 0.75 mg into the skin once a week.         Follow-up: Return in about 6 weeks (around 12/13/2020), or with Dr. Warrick Parisian for back spasm.  [name], M.D.

## 2020-11-08 DIAGNOSIS — I25118 Atherosclerotic heart disease of native coronary artery with other forms of angina pectoris: Secondary | ICD-10-CM | POA: Diagnosis not present

## 2020-11-08 DIAGNOSIS — R002 Palpitations: Secondary | ICD-10-CM | POA: Diagnosis not present

## 2020-11-08 DIAGNOSIS — I471 Supraventricular tachycardia: Secondary | ICD-10-CM | POA: Diagnosis not present

## 2020-11-08 DIAGNOSIS — I10 Essential (primary) hypertension: Secondary | ICD-10-CM | POA: Diagnosis not present

## 2020-11-14 ENCOUNTER — Encounter: Payer: Self-pay | Admitting: Family Medicine

## 2020-11-15 ENCOUNTER — Ambulatory Visit: Payer: BC Managed Care – PPO | Admitting: Family Medicine

## 2020-11-15 ENCOUNTER — Other Ambulatory Visit: Payer: Self-pay

## 2020-11-15 ENCOUNTER — Encounter: Payer: Self-pay | Admitting: Family Medicine

## 2020-11-15 VITALS — BP 142/79 | HR 102 | Ht 68.5 in | Wt 196.0 lb

## 2020-11-15 DIAGNOSIS — G4709 Other insomnia: Secondary | ICD-10-CM | POA: Diagnosis not present

## 2020-11-15 MED ORDER — QUETIAPINE FUMARATE 50 MG PO TABS: 50.0000 mg | ORAL_TABLET | Freq: Every day | ORAL | 5 refills | Status: DC

## 2020-11-15 NOTE — Patient Instructions (Signed)
Recommend to cut amitriptyline in half and only take 50 mg for 1 week and also takes Seroquel 50 mg for that week as well and then increase to 100 mg of Seroquel after that.

## 2020-11-15 NOTE — Progress Notes (Signed)
BP (!) 142/79   Pulse (!) 102   Ht 5' 8.5" (1.74 m)   Wt 196 lb (88.9 kg)   SpO2 99%   BMI 29.37 kg/m    Subjective:   Patient ID: Johnny Mathews, male    DOB: 1960-06-19, 60 y.o.   MRN: [id number]  HPI: Johnny Mathews is a 60 y.o. male presenting on 11/15/2020 for Insomnia   HPI Insomnia Patient is coming in today with trouble sleeping.  He has been taking the amitriptyline is not working so has been using more alcohol to help him sleep and he does not necessarily want to do that but he wants something that can help him because he is just not sleeping.  He denies any suicidal ideations or thoughts of himself.  He just says that he is ready to get some better sleep and wants to see something that can help him.  He does not necessarily know what keeps him awake except for that he is just not sleeping as well.  Relevant past medical, surgical, family and social history reviewed and updated as indicated. Interim medical history since our last visit reviewed. Allergies and medications reviewed and updated.  Review of Systems  Constitutional:  Negative for chills and fever.  Respiratory:  Negative for shortness of breath and wheezing.   Cardiovascular:  Negative for chest pain and leg swelling.  Skin:  Negative for rash.  Psychiatric/Behavioral:  Positive for sleep disturbance. Negative for decreased concentration and dysphoric mood.   All other systems reviewed and are negative.  Per HPI unless specifically indicated above   Allergies as of 11/15/2020       Reactions   Eggs Or Egg-derived Products Swelling   Throat- 03-31-2019 pt states can eat cakes and Pie with no issues  Throat- 03-31-2019 pt states can eat cakes and Pie with no issues    Galactose    Alpha- Gal    Septra [sulfamethoxazole-trimethoprim] Swelling   throat   Sulfonamide Derivatives Hives        Medication List        Accurate as of November 15, 2020  3:17 PM. If you have any questions, ask your  nurse or doctor.          STOP taking these medications    amitriptyline 100 MG tablet Commonly known as: ELAVIL Stopped by: Fransisca Kaufmann Petar Mucci, MD       TAKE these medications    albuterol 108 (90 Base) MCG/ACT inhaler Commonly known as: Ventolin HFA Inhale 2 puffs into the lungs every 4 (four) hours as needed. (Needs to be seen before next refill)   albuterol (2.5 MG/3ML) 0.083% nebulizer solution Commonly known as: PROVENTIL NEBULIZE 1 VIAL EVERY 6 HOURS AS NEEDED FOR WHEEZING & SHORTNESS OF BREATH   azelastine 0.1 % nasal spray Commonly known as: ASTELIN USE 1 SPRAY IN EACH NOSTRIL TWICE DAILY AS NEEDED What changed:  how much to take how to take this when to take this additional instructions   benzonatate 100 MG capsule Commonly known as: TESSALON Take 1 capsule (100 mg total) by mouth every 6 (six) hours as needed for cough.   Breztri Aerosphere 160-9-4.8 MCG/ACT Aero Generic drug: Budeson-Glycopyrrol-Formoterol Inhale 2 puffs into the lungs in the morning and at bedtime.   Contour Blood Glucose System w/Device Kit Test blood sugars four times daily   EPINEPHrine 0.3 mg/0.3 mL Soaj injection Commonly known as: EPI-PEN Inject 0.3 mg into the skin once.   esomeprazole  40 MG capsule Commonly known as: NEXIUM Take 1 capsule (40 mg total) by mouth daily at 12 noon. What changed: when to take this   famotidine 20 MG tablet Commonly known as: PEPCID Take 1 tablet (20 mg total) by mouth 2 (two) times daily. What changed: when to take this   ferrous sulfate 325 (65 FE) MG tablet Take 325 mg by mouth daily with breakfast.   finasteride 5 MG tablet Commonly known as: PROSCAR TAKE ONE (1) TABLET EACH DAY   fluticasone 50 MCG/ACT nasal spray Commonly known as: FLONASE USE 1 TO 2 SPRAYS IN EACH NOSTRIL DAILY   furosemide 20 MG tablet Commonly known as: LASIX Take 1 tablet (20 mg total) by mouth daily as needed. What changed: when to take this    glucose blood test strip Commonly known as: Contour Next Test Test blood sugars four times daily   losartan 50 MG tablet Commonly known as: COZAAR Take 1 tablet (50 mg total) by mouth daily.   Meclizine HCl 25 MG Chew TAKE ONE TABLET DAILY AT BEDTIME   meloxicam 7.5 MG tablet Commonly known as: MOBIC TAKE ONE (1) TABLET EACH DAY   metoprolol succinate 25 MG 24 hr tablet Commonly known as: TOPROL-XL Take 25 mg by mouth in the morning.   montelukast 10 MG tablet Commonly known as: SINGULAIR TAKE ONE TABLET DAILY AT BEDTIME   pravastatin 80 MG tablet Commonly known as: PRAVACHOL TAKE ONE (1) TABLET EACH DAY   QUEtiapine 50 MG tablet Commonly known as: SEROquel Take 1-2 tablets (50-100 mg total) by mouth at bedtime. Started by: Worthy Rancher, MD   sildenafil 20 MG tablet Commonly known as: REVATIO TAKE 1 TO 3 TABLETS DAILY AS NEEDED   Spacer/Aero-Holding Dorise Bullion Use with inhaler daily   tamsulosin 0.4 MG Caps capsule Commonly known as: FLOMAX TAKE 1 CAPSULE DAILY AFTER SUPPER   tiZANidine 4 MG tablet Commonly known as: ZANAFLEX Take 1 tablet (4 mg total) by mouth every 6 (six) hours as needed for muscle spasms.   Trulicity 2.50 IB/7.0WU Sopn Generic drug: Dulaglutide Inject 0.75 mg into the skin once a week.         Objective:   BP (!) 142/79   Pulse (!) 102   Ht 5' 8.5" (1.74 m)   Wt 196 lb (88.9 kg)   SpO2 99%   BMI 29.37 kg/m   Wt Readings from Last 3 Encounters:  11/15/20 196 lb (88.9 kg)  11/01/20 197 lb 12.8 oz (89.7 kg)  09/27/20 192 lb (87.1 kg)    Physical Exam Vitals and nursing note reviewed.  Constitutional:      General: He is not in acute distress.    Appearance: He is well-developed. He is not diaphoretic.  Neck:     Thyroid: No thyromegaly.  Musculoskeletal:     Cervical back: Neck supple.  Skin:    General: Skin is warm and dry.     Findings: No rash.  Neurological:     Mental Status: He is alert and oriented  to person, place, and time.     Coordination: Coordination normal.  Psychiatric:        Behavior: Behavior normal.      Assessment & Plan:   Problem List Items Addressed This Visit   None Visit Diagnoses     Other insomnia    -  Primary   Relevant Medications   QUEtiapine (SEROQUEL) 50 MG tablet       Will send  Seroquel for the patient and taper down on amitriptyline and taper up on the Seroquel, gave instructions for how to do that. Follow up plan: Return in about 3 months (around 02/15/2021), or if symptoms worsen or fail to improve, for Diabetes and insomnia.  Counseling provided for all of the vaccine components No orders of the defined types were placed in this encounter.   Caryl Pina, MD [facility] 11/15/2020, 3:17 PM

## 2020-12-08 ENCOUNTER — Other Ambulatory Visit: Payer: Self-pay | Admitting: Family Medicine

## 2020-12-10 DIAGNOSIS — Z95818 Presence of other cardiac implants and grafts: Secondary | ICD-10-CM | POA: Insufficient documentation

## 2020-12-10 DIAGNOSIS — I471 Supraventricular tachycardia: Secondary | ICD-10-CM | POA: Diagnosis not present

## 2020-12-10 DIAGNOSIS — R002 Palpitations: Secondary | ICD-10-CM | POA: Diagnosis not present

## 2020-12-10 NOTE — Telephone Encounter (Signed)
Last office visit 11/15/20 Last refill 11/01/20, #30, 1 refill

## 2020-12-12 ENCOUNTER — Encounter: Payer: Self-pay | Admitting: Family Medicine

## 2020-12-12 DIAGNOSIS — G4709 Other insomnia: Secondary | ICD-10-CM

## 2020-12-12 MED ORDER — QUETIAPINE FUMARATE 200 MG PO TABS: 200.0000 mg | ORAL_TABLET | Freq: Every day | ORAL | 3 refills | Status: DC

## 2020-12-13 ENCOUNTER — Encounter: Payer: Self-pay | Admitting: Family Medicine

## 2020-12-13 MED ORDER — EPINEPHRINE 0.3 MG/0.3ML IJ SOAJ: 0.3000 mg | Freq: Once | INTRAMUSCULAR | 1 refills | Status: DC

## 2020-12-20 DIAGNOSIS — I471 Supraventricular tachycardia: Secondary | ICD-10-CM | POA: Diagnosis not present

## 2020-12-20 DIAGNOSIS — R002 Palpitations: Secondary | ICD-10-CM | POA: Diagnosis not present

## 2020-12-24 DIAGNOSIS — H5210 Myopia, unspecified eye: Secondary | ICD-10-CM | POA: Diagnosis not present

## 2020-12-24 DIAGNOSIS — H5203 Hypermetropia, bilateral: Secondary | ICD-10-CM | POA: Diagnosis not present

## 2020-12-27 DIAGNOSIS — Z4509 Encounter for adjustment and management of other cardiac device: Secondary | ICD-10-CM | POA: Diagnosis not present

## 2020-12-28 DIAGNOSIS — Z4509 Encounter for adjustment and management of other cardiac device: Secondary | ICD-10-CM | POA: Diagnosis not present

## 2021-01-05 ENCOUNTER — Other Ambulatory Visit: Payer: Self-pay | Admitting: Family Medicine

## 2021-01-05 DIAGNOSIS — U071 COVID-19: Secondary | ICD-10-CM

## 2021-01-05 DIAGNOSIS — R0602 Shortness of breath: Secondary | ICD-10-CM

## 2021-01-09 DIAGNOSIS — R059 Cough, unspecified: Secondary | ICD-10-CM | POA: Diagnosis not present

## 2021-01-09 DIAGNOSIS — J329 Chronic sinusitis, unspecified: Secondary | ICD-10-CM | POA: Diagnosis not present

## 2021-01-10 DIAGNOSIS — Z4509 Encounter for adjustment and management of other cardiac device: Secondary | ICD-10-CM | POA: Diagnosis not present

## 2021-01-14 DIAGNOSIS — I4891 Unspecified atrial fibrillation: Secondary | ICD-10-CM | POA: Diagnosis not present

## 2021-01-14 DIAGNOSIS — Z4509 Encounter for adjustment and management of other cardiac device: Secondary | ICD-10-CM | POA: Diagnosis not present

## 2021-01-16 ENCOUNTER — Other Ambulatory Visit: Payer: Self-pay | Admitting: Family Medicine

## 2021-01-17 DIAGNOSIS — I480 Paroxysmal atrial fibrillation: Secondary | ICD-10-CM | POA: Diagnosis not present

## 2021-01-17 DIAGNOSIS — I350 Nonrheumatic aortic (valve) stenosis: Secondary | ICD-10-CM | POA: Diagnosis not present

## 2021-01-17 DIAGNOSIS — I25118 Atherosclerotic heart disease of native coronary artery with other forms of angina pectoris: Secondary | ICD-10-CM | POA: Diagnosis not present

## 2021-01-17 DIAGNOSIS — I5032 Chronic diastolic (congestive) heart failure: Secondary | ICD-10-CM | POA: Diagnosis not present

## 2021-01-18 ENCOUNTER — Ambulatory Visit: Payer: BC Managed Care – PPO | Admitting: Neurology

## 2021-01-28 DIAGNOSIS — Z6829 Body mass index (BMI) 29.0-29.9, adult: Secondary | ICD-10-CM | POA: Diagnosis not present

## 2021-01-28 DIAGNOSIS — I480 Paroxysmal atrial fibrillation: Secondary | ICD-10-CM | POA: Diagnosis not present

## 2021-01-30 DIAGNOSIS — I480 Paroxysmal atrial fibrillation: Secondary | ICD-10-CM | POA: Diagnosis not present

## 2021-01-31 DIAGNOSIS — I4891 Unspecified atrial fibrillation: Secondary | ICD-10-CM | POA: Diagnosis not present

## 2021-02-07 DIAGNOSIS — I5032 Chronic diastolic (congestive) heart failure: Secondary | ICD-10-CM | POA: Diagnosis not present

## 2021-02-07 DIAGNOSIS — I10 Essential (primary) hypertension: Secondary | ICD-10-CM | POA: Diagnosis not present

## 2021-02-07 DIAGNOSIS — I25118 Atherosclerotic heart disease of native coronary artery with other forms of angina pectoris: Secondary | ICD-10-CM | POA: Diagnosis not present

## 2021-02-07 DIAGNOSIS — I519 Heart disease, unspecified: Secondary | ICD-10-CM | POA: Diagnosis not present

## 2021-02-11 ENCOUNTER — Telehealth: Payer: Self-pay | Admitting: *Deleted

## 2021-02-11 DIAGNOSIS — Z4502 Encounter for adjustment and management of automatic implantable cardiac defibrillator: Secondary | ICD-10-CM | POA: Diagnosis not present

## 2021-02-11 NOTE — Telephone Encounter (Signed)
Approved today Effective from 02/11/2021 through 02/10/2022.  [facility]

## 2021-02-11 NOTE — Telephone Encounter (Signed)
Trulicity 0.75MG /0.5ML pen-injectors PA started   Key: [id number]  Sent to plan

## 2021-02-18 ENCOUNTER — Other Ambulatory Visit: Payer: Self-pay | Admitting: Family Medicine

## 2021-02-22 ENCOUNTER — Encounter: Payer: Self-pay | Admitting: Family Medicine

## 2021-02-22 ENCOUNTER — Ambulatory Visit (INDEPENDENT_AMBULATORY_CARE_PROVIDER_SITE_OTHER): Payer: BC Managed Care – PPO | Admitting: Family Medicine

## 2021-02-22 VITALS — BP 129/76 | HR 78 | Ht 68.5 in | Wt 193.0 lb

## 2021-02-22 DIAGNOSIS — Z23 Encounter for immunization: Secondary | ICD-10-CM | POA: Diagnosis not present

## 2021-02-22 DIAGNOSIS — E119 Type 2 diabetes mellitus without complications: Secondary | ICD-10-CM

## 2021-02-22 DIAGNOSIS — E785 Hyperlipidemia, unspecified: Secondary | ICD-10-CM

## 2021-02-22 DIAGNOSIS — N400 Enlarged prostate without lower urinary tract symptoms: Secondary | ICD-10-CM | POA: Diagnosis not present

## 2021-02-22 DIAGNOSIS — I10 Essential (primary) hypertension: Secondary | ICD-10-CM | POA: Diagnosis not present

## 2021-02-22 LAB — BAYER DCA HB A1C WAIVED: HB A1C (BAYER DCA - WAIVED): 4.9 % (ref 4.8–5.6)

## 2021-02-22 MED ORDER — ESOMEPRAZOLE MAGNESIUM 40 MG PO CPDR: DELAYED_RELEASE_CAPSULE | ORAL | 3 refills | Status: DC

## 2021-02-22 MED ORDER — FUROSEMIDE 20 MG PO TABS: 20.0000 mg | ORAL_TABLET | Freq: Every day | ORAL | 3 refills | Status: DC | PRN

## 2021-02-22 MED ORDER — PRAVASTATIN SODIUM 80 MG PO TABS: ORAL_TABLET | ORAL | 3 refills | Status: DC

## 2021-02-22 MED ORDER — TAMSULOSIN HCL 0.4 MG PO CAPS: ORAL_CAPSULE | ORAL | 3 refills | Status: DC

## 2021-02-22 NOTE — Progress Notes (Signed)
BP 129/76    Pulse 78    Ht 5' 8.5" (1.74 m)    Wt 193 lb (87.5 kg)    SpO2 97%    BMI 28.92 kg/m    Subjective:   Patient ID: Johnny Mathews, male    DOB: Mar 09, 1960, 61 y.o.   MRN: [id number]  HPI: Johnny Mathews is a 61 y.o. male presenting on 02/22/2021 for Medical Management of Chronic Issues and Diabetes   HPI Physical exam Patient denies any chest pain, shortness of breath, headaches or vision issues, abdominal complaints, diarrhea, nausea, vomiting, or joint issues.  He has muscular dystrophy she still has weakness and has no major change.  Type 2 diabetes mellitus Patient comes in today for recheck of his diabetes. Patient has been currently taking Trulicity. Patient is not currently on an ACE inhibitor/ARB. Patient has not seen an ophthalmologist this year. Patient denies any issues with their feet. The symptom started onset as an adult hypertension and hyperlipidemia ARE RELATED TO DM   Hypertension Patient is currently on his on flecainide and furosemide, and their blood pressure today is prescription for different pressure 129/76. Patient denies any lightheadedness or dizziness. Patient denies headaches, blurred vision, chest pains, shortness of breath, or weakness. Denies any side effects from medication and is content with current medication.   Hyperlipidemia Patient is coming in for recheck of his hyperlipidemia. The patient is currently taking pravastatin. They deny any issues with myalgias or history of liver damage from it. They deny any focal numbness or weakness or chest pain.   BPH Patient is coming in for recheck on BPH Symptoms: None on Medicine, does have some difficulty with stream when cough medicine. Medication: Flomax Last PSA: 11/09/2019 was 0.6  Relevant past medical, surgical, family and social history reviewed and updated as indicated. Interim medical history since our last visit reviewed. Allergies and medications reviewed and updated.  Review of  Systems  Constitutional:  Negative for chills and fever.  HENT:  Negative for ear pain and tinnitus.   Eyes:  Negative for pain and discharge.  Respiratory:  Negative for cough, shortness of breath and wheezing.   Cardiovascular:  Negative for chest pain, palpitations and leg swelling.  Gastrointestinal:  Negative for abdominal pain, blood in stool, constipation and diarrhea.  Genitourinary:  Negative for dysuria and hematuria.  Musculoskeletal:  Negative for back pain, gait problem and myalgias.  Skin:  Negative for rash.  Neurological:  Negative for dizziness, weakness and headaches.  Psychiatric/Behavioral:  Negative for suicidal ideas.   All other systems reviewed and are negative.  Per HPI unless specifically indicated above   Allergies as of 02/22/2021       Reactions   Eggs Or Egg-derived Products Swelling   Throat- 03-31-2019 pt states can eat cakes and Pie with no issues  Throat- 03-31-2019 pt states can eat cakes and Pie with no issues    Galactose    Alpha- Gal    Septra [sulfamethoxazole-trimethoprim] Swelling   throat   Sulfonamide Derivatives Hives        Medication List        Accurate as of February 22, 2021  3:31 PM. If you have any questions, ask your nurse or doctor.          albuterol 108 (90 Base) MCG/ACT inhaler Commonly known as: Ventolin HFA Inhale 2 puffs into the lungs every 4 (four) hours as needed. (Needs to be seen before next refill)   albuterol (2.5  MG/3ML) 0.083% nebulizer solution Commonly known as: PROVENTIL NEBULIZE 1 VIAL EVERY 6 HOURS AS NEEDED FOR WHEEZING & SHORTNESS OF BREATH   azelastine 0.1 % nasal spray Commonly known as: ASTELIN USE 1 SPRAY IN EACH NOSTRIL TWICE DAILY AS NEEDED What changed:  how much to take how to take this when to take this additional instructions   benzonatate 100 MG capsule Commonly known as: TESSALON Take 1 capsule (100 mg total) by mouth every 6 (six) hours as needed for cough.   Breztri  Aerosphere 160-9-4.8 MCG/ACT Aero Generic drug: Budeson-Glycopyrrol-Formoterol Inhale 2 puffs into the lungs in the morning and at bedtime.   Contour Blood Glucose System w/Device Kit Test blood sugars four times daily   esomeprazole 40 MG capsule Commonly known as: NEXIUM TAKE 1 CAPSULE DAILY AT NOON   famotidine 20 MG tablet Commonly known as: PEPCID Take 1 tablet (20 mg total) by mouth 2 (two) times daily. What changed: when to take this   ferrous sulfate 325 (65 FE) MG tablet Take 325 mg by mouth daily with breakfast.   finasteride 5 MG tablet Commonly known as: PROSCAR TAKE ONE (1) TABLET EACH DAY   flecainide 50 MG tablet Commonly known as: TAMBOCOR Take 50 mg by mouth 2 (two) times daily.   fluticasone 50 MCG/ACT nasal spray Commonly known as: FLONASE USE 1 TO 2 SPRAYS IN EACH NOSTRIL DAILY   furosemide 20 MG tablet Commonly known as: LASIX Take 1 tablet (20 mg total) by mouth daily as needed.   glucose blood test strip Commonly known as: Contour Next Test Test blood sugars four times daily   losartan 50 MG tablet Commonly known as: COZAAR Take 1 tablet (50 mg total) by mouth daily.   Meclizine HCl 25 MG Chew TAKE ONE TABLET DAILY AT BEDTIME   meloxicam 7.5 MG tablet Commonly known as: MOBIC TAKE ONE (1) TABLET EACH DAY   metoprolol succinate 50 MG 24 hr tablet Commonly known as: TOPROL-XL Take 50 mg by mouth daily. Take with or immediately following a meal. What changed: Another medication with the same name was removed. Continue taking this medication, and follow the directions you see here. Changed by: Fransisca Kaufmann Cortez Steelman, MD   montelukast 10 MG tablet Commonly known as: SINGULAIR TAKE ONE TABLET DAILY AT BEDTIME   pravastatin 80 MG tablet Commonly known as: PRAVACHOL Take one tablet daily What changed: See the new instructions. Changed by: Fransisca Kaufmann Cloteal Isaacson, MD   QUEtiapine 200 MG tablet Commonly known as: SEROquel Take 1 tablet (200 mg  total) by mouth at bedtime.   sildenafil 20 MG tablet Commonly known as: REVATIO TAKE 1 TO 3 TABLETS DAILY AS NEEDED   Spacer/Aero-Holding Dorise Bullion Use with inhaler daily   tamsulosin 0.4 MG Caps capsule Commonly known as: FLOMAX Take one tablet after supper What changed: See the new instructions. Changed by: Fransisca Kaufmann Lewie Deman, MD   tiZANidine 4 MG tablet Commonly known as: ZANAFLEX TAKE 1 TABLET BY MOUTH EVERY 6 HOURS AS NEEDED FOR MUSCLE SPAMS   Trulicity 0.01 VC/9.4WH Sopn Generic drug: Dulaglutide INJECT 0.75MG INTO THE SKIN ONCE A WEEK         Objective:   BP 129/76    Pulse 78    Ht 5' 8.5" (1.74 m)    Wt 193 lb (87.5 kg)    SpO2 97%    BMI 28.92 kg/m   Wt Readings from Last 3 Encounters:  02/22/21 193 lb (87.5 kg)  11/15/20 196 lb (  88.9 kg)  11/01/20 197 lb 12.8 oz (89.7 kg)    Physical Exam Vitals and nursing note reviewed.  Constitutional:      General: He is not in acute distress.    Appearance: He is well-developed. He is not diaphoretic.  HENT:     Right Ear: External ear normal.     Left Ear: External ear normal.     Nose: Nose normal.     Mouth/Throat:     Pharynx: No oropharyngeal exudate.  Eyes:     General: No scleral icterus.       Right eye: No discharge.     Conjunctiva/sclera: Conjunctivae normal.     Pupils: Pupils are equal, round, and reactive to light.  Neck:     Thyroid: No thyromegaly.  Cardiovascular:     Rate and Rhythm: Normal rate. Rhythm irregular.     Heart sounds: Murmur heard.  Pulmonary:     Effort: Pulmonary effort is normal. No respiratory distress.     Breath sounds: Normal breath sounds. No wheezing.  Abdominal:     General: Bowel sounds are normal. There is no distension.     Palpations: Abdomen is soft.     Tenderness: There is no abdominal tenderness. There is no guarding or rebound.  Musculoskeletal:        General: Normal range of motion.     Cervical back: Neck supple.  Lymphadenopathy:      Cervical: No cervical adenopathy.  Skin:    General: Skin is warm and dry.     Findings: No rash.  Neurological:     Mental Status: He is alert and oriented to person, place, and time.     Coordination: Coordination normal.  Psychiatric:        Behavior: Behavior normal.      Assessment & Plan:   Problem List Items Addressed This Visit       Cardiovascular and Mediastinum   Essential hypertension   Relevant Medications   furosemide (LASIX) 20 MG tablet   pravastatin (PRAVACHOL) 80 MG tablet   metoprolol succinate (TOPROL-XL) 50 MG 24 hr tablet   flecainide (TAMBOCOR) 50 MG tablet   Other Relevant Orders   CBC with Differential/Platelet   CMP14+EGFR   Lipid panel   Bayer DCA Hb A1c Waived   PSA, total and free     Endocrine   Type 2 diabetes mellitus without complication, without long-term current use of insulin (HCC) - Primary   Relevant Medications   pravastatin (PRAVACHOL) 80 MG tablet   Other Relevant Orders   CBC with Differential/Platelet   CMP14+EGFR   Lipid panel   Bayer DCA Hb A1c Waived   PSA, total and free     Genitourinary   BPH (benign prostatic hyperplasia)   Relevant Medications   tamsulosin (FLOMAX) 0.4 MG CAPS capsule   Other Relevant Orders   CBC with Differential/Platelet   CMP14+EGFR   Lipid panel   Bayer DCA Hb A1c Waived   PSA, total and free     Other   Hyperlipidemia with target LDL less than 100   Relevant Medications   furosemide (LASIX) 20 MG tablet   pravastatin (PRAVACHOL) 80 MG tablet   metoprolol succinate (TOPROL-XL) 50 MG 24 hr tablet   flecainide (TAMBOCOR) 50 MG tablet   Other Relevant Orders   CBC with Differential/Platelet   CMP14+EGFR   Lipid panel   Bayer DCA Hb A1c Waived   PSA, total and free   Other Visit  Diagnoses     Hyperlipidemia, unspecified hyperlipidemia type       Relevant Medications   furosemide (LASIX) 20 MG tablet   pravastatin (PRAVACHOL) 80 MG tablet   metoprolol succinate (TOPROL-XL) 50  MG 24 hr tablet   flecainide (TAMBOCOR) 50 MG tablet   Need for shingles vaccine       Relevant Orders   Varicella-zoster vaccine IM (Shingrix)       Continue current medicine.  Seems to be doing well. Seeing cardiology for A-fib and murmur A1c is 4.9 Follow up plan: Return in about 3 months (around 05/22/2021), or if symptoms worsen or fail to improve, for dm and htn and hld.  Counseling provided for all of the vaccine components Orders Placed This Encounter  Procedures   Varicella-zoster vaccine IM (Shingrix)   CBC with Differential/Platelet   CMP14+EGFR   Lipid panel   Bayer DCA Hb A1c Waived   PSA, total and free    Caryl Pina, MD [facility] 02/22/2021, 3:31 PM

## 2021-02-23 LAB — CBC WITH DIFFERENTIAL/PLATELET
Basophils Absolute: 0 10*3/uL (ref 0.0–0.2)
Basos: 1 %
EOS (ABSOLUTE): 0.2 10*3/uL (ref 0.0–0.4)
Eos: 3 %
Hematocrit: 35.1 % — ABNORMAL LOW (ref 37.5–51.0)
Hemoglobin: 11.9 g/dL — ABNORMAL LOW (ref 13.0–17.7)
Immature Grans (Abs): 0 10*3/uL (ref 0.0–0.1)
Immature Granulocytes: 1 %
Lymphocytes Absolute: 1.7 10*3/uL (ref 0.7–3.1)
Lymphs: 26 %
MCH: 31.5 pg (ref 26.6–33.0)
MCHC: 33.9 g/dL (ref 31.5–35.7)
MCV: 93 fL (ref 79–97)
Monocytes Absolute: 0.7 10*3/uL (ref 0.1–0.9)
Monocytes: 10 %
Neutrophils Absolute: 3.9 10*3/uL (ref 1.4–7.0)
Neutrophils: 59 %
Platelets: 245 10*3/uL (ref 150–450)
RBC: 3.78 x10E6/uL — ABNORMAL LOW (ref 4.14–5.80)
RDW: 12.8 % (ref 11.6–15.4)
WBC: 6.4 10*3/uL (ref 3.4–10.8)

## 2021-02-23 LAB — CMP14+EGFR
ALT: 16 IU/L (ref 0–44)
AST: 21 IU/L (ref 0–40)
Albumin/Globulin Ratio: 2 (ref 1.2–2.2)
Albumin: 4.6 g/dL (ref 3.8–4.8)
Alkaline Phosphatase: 112 IU/L (ref 44–121)
BUN/Creatinine Ratio: 15 (ref 10–24)
BUN: 22 mg/dL (ref 8–27)
Bilirubin Total: 0.3 mg/dL (ref 0.0–1.2)
CO2: 26 mmol/L (ref 20–29)
Calcium: 10.1 mg/dL (ref 8.6–10.2)
Chloride: 101 mmol/L (ref 96–106)
Creatinine, Ser: 1.44 mg/dL — ABNORMAL HIGH (ref 0.76–1.27)
Globulin, Total: 2.3 g/dL (ref 1.5–4.5)
Glucose: 85 mg/dL (ref 70–99)
Potassium: 4.8 mmol/L (ref 3.5–5.2)
Sodium: 141 mmol/L (ref 134–144)
Total Protein: 6.9 g/dL (ref 6.0–8.5)
eGFR: 55 mL/min/{1.73_m2} — ABNORMAL LOW (ref >59)

## 2021-02-23 LAB — LIPID PANEL
Chol/HDL Ratio: 4.5 ratio (ref 0.0–5.0)
Cholesterol, Total: 294 mg/dL — ABNORMAL HIGH (ref 100–199)
HDL: 65 mg/dL (ref >39)
LDL Chol Calc (NIH): 166 mg/dL — ABNORMAL HIGH (ref 0–99)
Triglycerides: 334 mg/dL — ABNORMAL HIGH (ref 0–149)
VLDL Cholesterol Cal: 63 mg/dL — ABNORMAL HIGH (ref 5–40)

## 2021-02-23 LAB — PSA, TOTAL AND FREE
PSA, Free Pct: 30 %
PSA, Free: 0.03 ng/mL
Prostate Specific Ag, Serum: 0.1 ng/mL (ref 0.0–4.0)

## 2021-02-24 ENCOUNTER — Other Ambulatory Visit: Payer: Self-pay | Admitting: Family Medicine

## 2021-03-04 ENCOUNTER — Other Ambulatory Visit: Payer: Self-pay

## 2021-03-04 DIAGNOSIS — N289 Disorder of kidney and ureter, unspecified: Secondary | ICD-10-CM

## 2021-03-12 ENCOUNTER — Other Ambulatory Visit: Payer: Self-pay | Admitting: Family Medicine

## 2021-03-14 DIAGNOSIS — Z4509 Encounter for adjustment and management of other cardiac device: Secondary | ICD-10-CM | POA: Diagnosis not present

## 2021-03-28 DIAGNOSIS — I480 Paroxysmal atrial fibrillation: Secondary | ICD-10-CM | POA: Diagnosis not present

## 2021-03-28 DIAGNOSIS — I2510 Atherosclerotic heart disease of native coronary artery without angina pectoris: Secondary | ICD-10-CM | POA: Diagnosis not present

## 2021-03-29 DIAGNOSIS — N189 Chronic kidney disease, unspecified: Secondary | ICD-10-CM | POA: Diagnosis not present

## 2021-03-29 DIAGNOSIS — I4892 Unspecified atrial flutter: Secondary | ICD-10-CM | POA: Diagnosis not present

## 2021-03-29 DIAGNOSIS — Z79899 Other long term (current) drug therapy: Secondary | ICD-10-CM | POA: Diagnosis not present

## 2021-03-29 DIAGNOSIS — I130 Hypertensive heart and chronic kidney disease with heart failure and stage 1 through stage 4 chronic kidney disease, or unspecified chronic kidney disease: Secondary | ICD-10-CM | POA: Diagnosis not present

## 2021-03-29 DIAGNOSIS — I5030 Unspecified diastolic (congestive) heart failure: Secondary | ICD-10-CM | POA: Diagnosis not present

## 2021-03-29 DIAGNOSIS — I480 Paroxysmal atrial fibrillation: Secondary | ICD-10-CM | POA: Diagnosis not present

## 2021-03-29 DIAGNOSIS — E1122 Type 2 diabetes mellitus with diabetic chronic kidney disease: Secondary | ICD-10-CM | POA: Diagnosis not present

## 2021-03-31 DIAGNOSIS — I480 Paroxysmal atrial fibrillation: Secondary | ICD-10-CM | POA: Diagnosis not present

## 2021-04-04 ENCOUNTER — Other Ambulatory Visit: Payer: Self-pay | Admitting: Family Medicine

## 2021-04-04 DIAGNOSIS — R0602 Shortness of breath: Secondary | ICD-10-CM

## 2021-04-04 DIAGNOSIS — U071 COVID-19: Secondary | ICD-10-CM

## 2021-04-09 ENCOUNTER — Other Ambulatory Visit: Payer: Self-pay | Admitting: Family Medicine

## 2021-04-15 ENCOUNTER — Other Ambulatory Visit: Payer: Self-pay | Admitting: Physician Assistant

## 2021-04-19 ENCOUNTER — Telehealth: Payer: BC Managed Care – PPO | Admitting: Physician Assistant

## 2021-04-19 ENCOUNTER — Encounter: Payer: Self-pay | Admitting: Family Medicine

## 2021-04-19 DIAGNOSIS — K047 Periapical abscess without sinus: Secondary | ICD-10-CM

## 2021-04-19 DIAGNOSIS — I350 Nonrheumatic aortic (valve) stenosis: Secondary | ICD-10-CM | POA: Diagnosis not present

## 2021-04-19 DIAGNOSIS — K0889 Other specified disorders of teeth and supporting structures: Secondary | ICD-10-CM

## 2021-04-19 DIAGNOSIS — Z6829 Body mass index (BMI) 29.0-29.9, adult: Secondary | ICD-10-CM | POA: Diagnosis not present

## 2021-04-19 MED ORDER — NAPROXEN 500 MG PO TABS: 500.0000 mg | ORAL_TABLET | Freq: Two times a day (BID) | ORAL | 0 refills | Status: DC

## 2021-04-19 MED ORDER — AMOXICILLIN 500 MG PO CAPS: 500.0000 mg | ORAL_CAPSULE | Freq: Three times a day (TID) | ORAL | 0 refills | Status: AC

## 2021-04-19 NOTE — Progress Notes (Signed)
E-Visit for Dental Pain ? ?We are sorry that you are not feeling well.  Here is how we plan to help! ? ?Based on what you have shared with me in the questionnaire, it sounds like you have dental infection with a broken tooth. ? ?naprosyn '500mg'$  2 times per day for 7 days for discomfort and Amoxicillin '500mg'$  3 times per day for 10 days ? ?There is also a product OTC called Dentemp that is an oral putty you can put over the broken tooth to help decrease pain and sensitivity to temperatures until seen. ? ?It is imperative that you see a dentist within 10 days of this eVisit to determine the cause of the dental pain and be sure it is adequately treated ? ?A toothache or tooth pain is caused when the nerve in the root of a tooth or surrounding a tooth is irritated. Dental (tooth) infection, decay, injury, or loss of a tooth are the most common causes of dental pain. Pain may also occur after an extraction (tooth is pulled out). Pain sometimes originates from other areas and radiates to the jaw, thus appearing to be tooth pain.Bacteria growing inside your mouth can contribute to gum disease and dental decay, both of which can cause pain. A toothache occurs from inflammation of the central portion of the tooth called pulp. The pulp contains nerve endings that are very sensitive to pain. Inflammation to the pulp or pulpitis may be caused by dental cavities, trauma, and infection.  ? ? ?HOME CARE:  ? ?For toothaches: ?Over-the-counter pain medications such as acetaminophen or ibuprofen may be used. Take these as directed on the package while you arrange for a dental appointment. ?Avoid very cold or hot foods, because they may make the pain worse. ?You may get relief from biting on a cotton ball soaked in oil of cloves. You can get oil of cloves at most drug stores. ? ?For jaw pain: ? Aspirin may be helpful for problems in the joint of the jaw in adults. ?If pain happens every time you open your mouth widely, the  temporomandibular joint (TMJ) may be the source of the pain. Yawning or taking a large bite of food may worsen the pain. An appointment with your doctor or dentist will help you find the cause. ?  ? ? ?GET HELP RIGHT AWAY IF: ? ?You have a high fever or chills ?If you have had a recent head or face injury and develop headache, light headedness, nausea, vomiting, or other symptoms that concern you after an injury to your face or mouth, you could have a more serious injury in addition to your dental injury. ?A facial rash associated with a toothache: This condition may improve with medication. Contact your doctor for them to decide what is appropriate. ?Any jaw pain occurring with chest pain: Although jaw pain is most commonly caused by dental disease, it is sometimes referred pain from other areas. People with heart disease, especially people who have had stents placed, people with diabetes, or those who have had heart surgery may have jaw pain as a symptom of heart attack or angina. If your jaw or tooth pain is associated with lightheadedness, sweating, or shortness of breath, you should see a doctor as soon as possible. ?Trouble swallowing or excessive pain or bleeding from gums: If you have a history of a weakened immune system, diabetes, or steroid use, you may be more susceptible to infections. Infections can often be more severe and extensive or caused by  unusual organisms. Dental and gum infections in people with these conditions may require more aggressive treatment. An abscess may need draining or IV antibiotics, for example. ? ?MAKE SURE YOU  ? ?Understand these instructions. ?Will watch your condition. ?Will get help right away if you are not doing well or get worse. ? ?Thank you for choosing an e-visit. ? ?Your e-visit answers were reviewed by a board certified advanced clinical practitioner to complete your personal care plan. Depending upon the condition, your plan could have included both over the  counter or prescription medications. ? ?Please review your pharmacy choice. Make sure the pharmacy is open so you can pick up prescription now. If there is a problem, you may contact your provider through [employer] and have the prescription routed to another pharmacy.  Your safety is important to [place]. If you have drug allergies check your prescription carefully.  ? ?For the next 24 hours you can use MyChart to ask questions about today's visit, request a non-urgent call back, or ask for a work or school excuse. ?You will get an email in the next two days asking about your experience. I hope that your e-visit has been valuable and will speed your recovery. ? ? ?I provided 5 minutes of non face-to-face time during this encounter for chart review and documentation.  ? ?

## 2021-04-26 DIAGNOSIS — E785 Hyperlipidemia, unspecified: Secondary | ICD-10-CM | POA: Diagnosis not present

## 2021-04-26 DIAGNOSIS — I350 Nonrheumatic aortic (valve) stenosis: Secondary | ICD-10-CM | POA: Diagnosis not present

## 2021-04-26 DIAGNOSIS — I10 Essential (primary) hypertension: Secondary | ICD-10-CM | POA: Diagnosis not present

## 2021-04-26 DIAGNOSIS — E119 Type 2 diabetes mellitus without complications: Secondary | ICD-10-CM | POA: Diagnosis not present

## 2021-04-26 DIAGNOSIS — Z4509 Encounter for adjustment and management of other cardiac device: Secondary | ICD-10-CM | POA: Diagnosis not present

## 2021-04-26 DIAGNOSIS — J984 Other disorders of lung: Secondary | ICD-10-CM | POA: Diagnosis not present

## 2021-04-26 DIAGNOSIS — I351 Nonrheumatic aortic (valve) insufficiency: Secondary | ICD-10-CM | POA: Diagnosis not present

## 2021-04-29 DIAGNOSIS — I110 Hypertensive heart disease with heart failure: Secondary | ICD-10-CM | POA: Diagnosis not present

## 2021-04-29 DIAGNOSIS — I480 Paroxysmal atrial fibrillation: Secondary | ICD-10-CM | POA: Diagnosis not present

## 2021-04-29 DIAGNOSIS — R9431 Abnormal electrocardiogram [ECG] [EKG]: Secondary | ICD-10-CM | POA: Diagnosis not present

## 2021-04-29 DIAGNOSIS — E785 Hyperlipidemia, unspecified: Secondary | ICD-10-CM | POA: Diagnosis not present

## 2021-04-29 DIAGNOSIS — G7100 Muscular dystrophy, unspecified: Secondary | ICD-10-CM | POA: Diagnosis not present

## 2021-04-29 DIAGNOSIS — R002 Palpitations: Secondary | ICD-10-CM | POA: Diagnosis not present

## 2021-04-29 DIAGNOSIS — R55 Syncope and collapse: Secondary | ICD-10-CM | POA: Diagnosis not present

## 2021-04-29 DIAGNOSIS — R42 Dizziness and giddiness: Secondary | ICD-10-CM | POA: Diagnosis not present

## 2021-04-29 DIAGNOSIS — I509 Heart failure, unspecified: Secondary | ICD-10-CM | POA: Diagnosis not present

## 2021-04-29 DIAGNOSIS — Z6828 Body mass index (BMI) 28.0-28.9, adult: Secondary | ICD-10-CM | POA: Diagnosis not present

## 2021-04-29 DIAGNOSIS — E119 Type 2 diabetes mellitus without complications: Secondary | ICD-10-CM | POA: Diagnosis not present

## 2021-04-29 DIAGNOSIS — Z8616 Personal history of COVID-19: Secondary | ICD-10-CM | POA: Diagnosis not present

## 2021-04-29 DIAGNOSIS — I2510 Atherosclerotic heart disease of native coronary artery without angina pectoris: Secondary | ICD-10-CM | POA: Diagnosis not present

## 2021-04-29 DIAGNOSIS — Z79899 Other long term (current) drug therapy: Secondary | ICD-10-CM | POA: Diagnosis not present

## 2021-04-29 DIAGNOSIS — H49819 Kearns-Sayre syndrome, unspecified eye: Secondary | ICD-10-CM | POA: Diagnosis not present

## 2021-04-29 DIAGNOSIS — Z7901 Long term (current) use of anticoagulants: Secondary | ICD-10-CM | POA: Diagnosis not present

## 2021-05-10 DIAGNOSIS — I480 Paroxysmal atrial fibrillation: Secondary | ICD-10-CM | POA: Diagnosis not present

## 2021-05-20 DIAGNOSIS — I480 Paroxysmal atrial fibrillation: Secondary | ICD-10-CM | POA: Diagnosis not present

## 2021-05-21 DIAGNOSIS — I25118 Atherosclerotic heart disease of native coronary artery with other forms of angina pectoris: Secondary | ICD-10-CM | POA: Diagnosis not present

## 2021-05-21 DIAGNOSIS — I10 Essential (primary) hypertension: Secondary | ICD-10-CM | POA: Diagnosis not present

## 2021-05-21 DIAGNOSIS — I480 Paroxysmal atrial fibrillation: Secondary | ICD-10-CM | POA: Diagnosis not present

## 2021-05-21 DIAGNOSIS — I5032 Chronic diastolic (congestive) heart failure: Secondary | ICD-10-CM | POA: Diagnosis not present

## 2021-05-22 ENCOUNTER — Encounter: Payer: Self-pay | Admitting: Family Medicine

## 2021-05-22 ENCOUNTER — Ambulatory Visit: Payer: BC Managed Care – PPO | Admitting: Family Medicine

## 2021-05-22 VITALS — BP 124/72 | HR 61 | Ht 68.5 in | Wt 196.0 lb

## 2021-05-22 DIAGNOSIS — E291 Testicular hypofunction: Secondary | ICD-10-CM | POA: Diagnosis not present

## 2021-05-22 DIAGNOSIS — E119 Type 2 diabetes mellitus without complications: Secondary | ICD-10-CM | POA: Diagnosis not present

## 2021-05-22 DIAGNOSIS — E785 Hyperlipidemia, unspecified: Secondary | ICD-10-CM

## 2021-05-22 DIAGNOSIS — I10 Essential (primary) hypertension: Secondary | ICD-10-CM | POA: Diagnosis not present

## 2021-05-22 DIAGNOSIS — G4709 Other insomnia: Secondary | ICD-10-CM

## 2021-05-22 LAB — BAYER DCA HB A1C WAIVED: HB A1C (BAYER DCA - WAIVED): 4.9 % (ref 4.8–5.6)

## 2021-05-22 MED ORDER — TRULICITY 0.75 MG/0.5ML ~~LOC~~ SOAJ: 0.7500 mg | SUBCUTANEOUS | 3 refills | Status: DC

## 2021-05-22 MED ORDER — ARIPIPRAZOLE 10 MG PO TABS: 10.0000 mg | ORAL_TABLET | Freq: Every day | ORAL | 3 refills | Status: DC

## 2021-05-22 NOTE — Progress Notes (Signed)
? ?BP 124/72   Pulse 61   Ht 5' 8.5" (1.74 m)   Wt 196 lb (88.9 kg)   SpO2 95%   BMI 29.37 kg/m?   ? ?Subjective:  ? ?Patient ID: Johnny Mathews, male    DOB: 1960-07-22, 61 y.o.   MRN: [id number] ? ?HPI: ?Johnny Mathews is a 61 y.o. male presenting on 05/22/2021 for Medical Management of Chronic Issues, Hyperlipidemia, Hypertension, Diabetes, and Atrial Fibrillation ? ? ?HPI ?Type 2 diabetes mellitus ?Patient comes in today for recheck of his diabetes. Patient has been currently taking Trulicity. Patient is currently on an ACE inhibitor/ARB. Patient has not seen an ophthalmologist this year. Patient denies any issues with their feet. The symptom started onset as an adult hyperlipidemia ARE RELATED TO DM  ? ?Hyperlipidemia ?Patient is coming in for recheck of his hyperlipidemia. The patient is currently taking pravastatin. They deny any issues with myalgias or history of liver damage from it. They deny any focal numbness or weakness or chest pain.  ? ?Hypertension ?Patient is currently on flecainide and furosemide and losartan and metoprolol, and their blood pressure today is 124/72. Patient denies any lightheadedness or dizziness. Patient denies headaches, blurred vision, chest pains, shortness of breath, or weakness. Denies any side effects from medication and is content with current medication.  ? ?Afib recheck ?Patient is coming in for A-fib recheck.  He does see cardiology. ? ?Patient has a history of testosterone deficiency and saw endocrinology before and wants to go back.  Testosterone deficiency ? ?Relevant past medical, surgical, family and social history reviewed and updated as indicated. Interim medical history since our last visit reviewed. ?Allergies and medications reviewed and updated. ? ?Review of Systems  ?Constitutional:  Negative for chills and fever.  ?Respiratory:  Negative for cough, shortness of breath and wheezing.   ?Cardiovascular:  Negative for chest pain, palpitations and leg  swelling.  ?Gastrointestinal:  Negative for abdominal pain, blood in stool, constipation and diarrhea.  ?Musculoskeletal:  Positive for gait problem. Negative for back pain and myalgias.  ?Skin:  Negative for rash.  ?Neurological:  Negative for weakness.  ?Psychiatric/Behavioral:  Negative for suicidal ideas.   ? ?Per HPI unless specifically indicated above ? ? ?Allergies as of 05/22/2021   ? ?   Reactions  ? Eggs Or Egg-derived Products Swelling  ? Throat- 03-31-2019 pt states can eat cakes and Pie with no issues  ?Throat- 03-31-2019 pt states can eat cakes and Pie with no issues   ? Galactose   ? Alpha- Gal   ? Septra [sulfamethoxazole-trimethoprim] Swelling  ? throat  ? Sulfonamide Derivatives Hives  ? ?  ? ?  ?Medication List  ?  ? ?  ? Accurate as of May 22, 2021  2:43 PM. If you have any questions, ask your nurse or doctor.  ?  ?  ? ?  ? ?STOP taking these medications   ? ?QUEtiapine 200 MG tablet ?Commonly known as: SEROquel ?Stopped by: Worthy Rancher, MD ?  ? ?  ? ?TAKE these medications   ? ?albuterol 108 (90 Base) MCG/ACT inhaler ?Commonly known as: Ventolin HFA ?Inhale 2 puffs into the lungs every 4 (four) hours as needed. (Needs to be seen before next refill) ?  ?albuterol (2.5 MG/3ML) 0.083% nebulizer solution ?Commonly known as: PROVENTIL ?NEBULIZE 1 VIAL EVERY 6 HOURS AS NEEDED FOR WHEEZING & SHORTNESS OF BREATH ?  ?ARIPiprazole 10 MG tablet ?Commonly known as: Abilify ?Take 1 tablet (10 mg total) by  mouth daily. ?Started by: Worthy Rancher, MD ?  ?azelastine 0.1 % nasal spray ?Commonly known as: ASTELIN ?USE 1 SPRAY IN EACH NOSTRIL TWICE DAILY AS NEEDED ?  ?benzonatate 100 MG capsule ?Commonly known as: TESSALON ?Take 1 capsule (100 mg total) by mouth every 6 (six) hours as needed for cough. ?  ?Breztri Aerosphere 160-9-4.8 MCG/ACT Aero ?Generic drug: Budeson-Glycopyrrol-Formoterol ?Inhale 2 puffs into the lungs in the morning and at bedtime. ?  ?Contour Blood Glucose System w/Device Kit ?Test  blood sugars four times daily ?  ?EPINEPHrine 0.3 mg/0.3 mL Soaj injection ?Commonly known as: EPI-PEN ?INJECT 0.3ML (0.3MG) IM ONCE ?  ?esomeprazole 40 MG capsule ?Commonly known as: [place] ?TAKE 1 CAPSULE DAILY AT NOON ?  ?famotidine 20 MG tablet ?Commonly known as: PEPCID ?Take 1 tablet (20 mg total) by mouth 2 (two) times daily. ?What changed: when to take this ?  ?ferrous sulfate 325 (65 FE) MG tablet ?Take 325 mg by mouth daily with breakfast. ?  ?finasteride 5 MG tablet ?Commonly known as: PROSCAR ?TAKE ONE (1) TABLET EACH DAY ?  ?flecainide 50 MG tablet ?Commonly known as: TAMBOCOR ?Take 50 mg by mouth 2 (two) times daily. ?  ?fluticasone 50 MCG/ACT nasal spray ?Commonly known as: FLONASE ?USE 1 TO 2 SPRAYS IN EACH NOSTRIL DAILY ?  ?furosemide 20 MG tablet ?Commonly known as: LASIX ?Take 1 tablet (20 mg total) by mouth daily as needed. ?  ?glucose blood test strip ?Commonly known as: Contour Next Test ?Test blood sugars four times daily ?  ?losartan 50 MG tablet ?Commonly known as: COZAAR ?Take 1 tablet (50 mg total) by mouth daily. ?  ?Meclizine HCl 25 MG Chew ?TAKE ONE TABLET DAILY AT BEDTIME ?  ?meloxicam 7.5 MG tablet ?Commonly known as: MOBIC ?TAKE ONE (1) TABLET EACH DAY ?  ?metoprolol succinate 50 MG 24 hr tablet ?Commonly known as: TOPROL-XL ?Take 50 mg by mouth daily. Take with or immediately following a meal. ?  ?montelukast 10 MG tablet ?Commonly known as: SINGULAIR ?TAKE ONE TABLET DAILY AT BEDTIME ?  ?naproxen 500 MG tablet ?Commonly known as: NAPROSYN ?Take 1 tablet (500 mg total) by mouth 2 (two) times daily with a meal. ?  ?pravastatin 80 MG tablet ?Commonly known as: PRAVACHOL ?Take one tablet daily ?  ?sildenafil 20 MG tablet ?Commonly known as: REVATIO ?TAKE 1 TO 3 TABLETS DAILY AS NEEDED ?  ?Spacer/Aero-Holding Dorise Bullion ?Use with inhaler daily ?  ?tamsulosin 0.4 MG Caps capsule ?Commonly known as: FLOMAX ?Take one tablet after supper ?  ?tiZANidine 4 MG tablet ?Commonly known as:  ZANAFLEX ?TAKE 1 TABLET BY MOUTH EVERY 6 HOURS AS NEEDED FOR MUSCLE SPAMS ?  ?Trulicity 4.17 EY/8.1KG Sopn ?Generic drug: Dulaglutide ?Inject 0.75 mg into the skin once a week. ?  ? ?  ? ? ? ?Objective:  ? ?BP 124/72   Pulse 61   Ht 5' 8.5" (1.74 m)   Wt 196 lb (88.9 kg)   SpO2 95%   BMI 29.37 kg/m?   ?Wt Readings from Last 3 Encounters:  ?05/22/21 196 lb (88.9 kg)  ?02/22/21 193 lb (87.5 kg)  ?11/15/20 196 lb (88.9 kg)  ?  ?Physical Exam ?Vitals and nursing note reviewed.  ?Constitutional:   ?   General: He is not in acute distress. ?   Appearance: He is well-developed. He is not diaphoretic.  ?Eyes:  ?   General: No scleral icterus. ?   Conjunctiva/sclera: Conjunctivae normal.  ?Neck:  ?  Thyroid: No thyromegaly.  ?Cardiovascular:  ?   Rate and Rhythm: Normal rate and regular rhythm.  ?   Heart sounds: Normal heart sounds. No murmur heard. ?Pulmonary:  ?   Effort: Pulmonary effort is normal. No respiratory distress.  ?   Breath sounds: Normal breath sounds. No wheezing.  ?Musculoskeletal:     ?   General: No swelling. Normal range of motion.  ?   Cervical back: Neck supple.  ?Lymphadenopathy:  ?   Cervical: No cervical adenopathy.  ?Skin: ?   General: Skin is warm and dry.  ?   Findings: No rash.  ?Neurological:  ?   Mental Status: He is alert and oriented to person, place, and time.  ?   Coordination: Coordination normal.  ?Psychiatric:     ?   Behavior: Behavior normal.  ? ? ? ? ?Assessment & Plan:  ? ?Problem List Items Addressed This Visit   ? ?  ? Cardiovascular and Mediastinum  ? Essential hypertension  ? Relevant Orders  ? CBC with Differential/Platelet  ? CMP14+EGFR  ? Lipid panel  ? Bayer DCA Hb A1c Waived  ?  ? Endocrine  ? Type 2 diabetes mellitus without complication, without long-term current use of insulin ([place]) - Primary  ? Relevant Medications  ? Dulaglutide (TRULICITY) 8.92 JJ/9.4RD SOPN  ? Other Relevant Orders  ? CBC with Differential/Platelet  ? CMP14+EGFR  ? Lipid panel  ? Bayer DCA Hb  A1c Waived  ?  ? Other  ? Hyperlipidemia with target LDL less than 100  ? Relevant Orders  ? CBC with Differential/Platelet  ? CMP14+EGFR  ? Lipid panel  ? Bayer DCA Hb A1c Waived  ? ?Other Visit Diagnoses   ? ? Testoster

## 2021-05-23 ENCOUNTER — Telehealth: Payer: Self-pay | Admitting: "Endocrinology

## 2021-05-23 LAB — CBC WITH DIFFERENTIAL/PLATELET
Basophils Absolute: 0 10*3/uL (ref 0.0–0.2)
Basos: 1 %
EOS (ABSOLUTE): 0.3 10*3/uL (ref 0.0–0.4)
Eos: 5 %
Hematocrit: 32.4 % — ABNORMAL LOW (ref 37.5–51.0)
Hemoglobin: 10.7 g/dL — ABNORMAL LOW (ref 13.0–17.7)
Immature Grans (Abs): 0 10*3/uL (ref 0.0–0.1)
Immature Granulocytes: 0 %
Lymphocytes Absolute: 1.6 10*3/uL (ref 0.7–3.1)
Lymphs: 27 %
MCH: 31.2 pg (ref 26.6–33.0)
MCHC: 33 g/dL (ref 31.5–35.7)
MCV: 95 fL (ref 79–97)
Monocytes Absolute: 0.6 10*3/uL (ref 0.1–0.9)
Monocytes: 10 %
Neutrophils Absolute: 3.5 10*3/uL (ref 1.4–7.0)
Neutrophils: 57 %
Platelets: 257 10*3/uL (ref 150–450)
RBC: 3.43 x10E6/uL — ABNORMAL LOW (ref 4.14–5.80)
RDW: 12.6 % (ref 11.6–15.4)
WBC: 6.2 10*3/uL (ref 3.4–10.8)

## 2021-05-23 LAB — LIPID PANEL
Chol/HDL Ratio: 2.9 ratio (ref 0.0–5.0)
Cholesterol, Total: 169 mg/dL (ref 100–199)
HDL: 58 mg/dL (ref >39)
LDL Chol Calc (NIH): 81 mg/dL (ref 0–99)
Triglycerides: 180 mg/dL — ABNORMAL HIGH (ref 0–149)
VLDL Cholesterol Cal: 30 mg/dL (ref 5–40)

## 2021-05-23 LAB — CMP14+EGFR
ALT: 22 IU/L (ref 0–44)
AST: 22 IU/L (ref 0–40)
Albumin/Globulin Ratio: 2 (ref 1.2–2.2)
Albumin: 4.5 g/dL (ref 3.8–4.8)
Alkaline Phosphatase: 113 IU/L (ref 44–121)
BUN/Creatinine Ratio: 11 (ref 10–24)
BUN: 16 mg/dL (ref 8–27)
Bilirubin Total: 0.3 mg/dL (ref 0.0–1.2)
CO2: 27 mmol/L (ref 20–29)
Calcium: 9.7 mg/dL (ref 8.6–10.2)
Chloride: 102 mmol/L (ref 96–106)
Creatinine, Ser: 1.41 mg/dL — ABNORMAL HIGH (ref 0.76–1.27)
Globulin, Total: 2.3 g/dL (ref 1.5–4.5)
Glucose: 86 mg/dL (ref 70–99)
Potassium: 5.3 mmol/L — ABNORMAL HIGH (ref 3.5–5.2)
Sodium: 144 mmol/L (ref 134–144)
Total Protein: 6.8 g/dL (ref 6.0–8.5)
eGFR: 56 mL/min/{1.73_m2} — ABNORMAL LOW (ref >59)

## 2021-05-23 NOTE — Telephone Encounter (Signed)
Dr Dorris Fetch Can you review this chart? Received a referral on patient for Testosterone deficiency in male. I am not seeing anything recent for this. Can you let me know if you want me to schedule or me to decline referral until recent labs are completed.  Thank you

## 2021-05-24 NOTE — Telephone Encounter (Signed)
Called patient, was forwarded to VM. Sch with nida if he calls back

## 2021-05-28 DIAGNOSIS — Z4509 Encounter for adjustment and management of other cardiac device: Secondary | ICD-10-CM | POA: Diagnosis not present

## 2021-06-18 ENCOUNTER — Encounter: Payer: Self-pay | Admitting: Family Medicine

## 2021-06-20 ENCOUNTER — Ambulatory Visit: Payer: BC Managed Care – PPO | Admitting: Family

## 2021-06-20 NOTE — Telephone Encounter (Signed)
Johnny Mathews,   Can we see him in office today or a phone call ok? Might need more documentation referral?

## 2021-06-24 ENCOUNTER — Encounter: Payer: Self-pay | Admitting: Family Medicine

## 2021-06-24 ENCOUNTER — Ambulatory Visit: Payer: BC Managed Care – PPO | Admitting: Family Medicine

## 2021-06-24 VITALS — BP 154/74 | HR 73 | Temp 98.0°F | Ht 68.5 in | Wt 196.0 lb

## 2021-06-24 DIAGNOSIS — G4709 Other insomnia: Secondary | ICD-10-CM | POA: Diagnosis not present

## 2021-06-24 DIAGNOSIS — K4090 Unilateral inguinal hernia, without obstruction or gangrene, not specified as recurrent: Secondary | ICD-10-CM | POA: Diagnosis not present

## 2021-06-24 MED ORDER — AMITRIPTYLINE HCL 50 MG PO TABS: 50.0000 mg | ORAL_TABLET | Freq: Every day | ORAL | 2 refills | Status: DC

## 2021-06-24 NOTE — Progress Notes (Signed)
BP (!) 154/74   Pulse 73   Temp 98 F (36.7 C)   Ht 5' 8.5" (1.74 m)   Wt 196 lb (88.9 kg)   SpO2 100%   BMI 29.37 kg/m    Subjective:   Patient ID: Johnny Mathews, male    DOB: 1960-09-28, 61 y.o.   MRN: [id number]  HPI: Johnny Mathews is a 62 y.o. male presenting on 06/24/2021 for Hernia (Inguinal- right. Popping in and out. H/O surgery with mesh.)   HPI Right inguinal hernia Patient is still having right inguinal hernia, its been a couple years it has been popping in and out but he feels like it is popping out more frequently and he is worried that it would get stuck out.  He denies any pain or constipation or blood in his stool.  Insomnia Patient is having insomnia and he feels like the Abilify even doubling it is not helping at all.  Amitriptyline worked best for him before and has been a little less since he was off of it, we will try to go back to that 1.  He also tried Seroquel.  Relevant past medical, surgical, family and social history reviewed and updated as indicated. Interim medical history since our last visit reviewed. Allergies and medications reviewed and updated.  Review of Systems  Constitutional:  Negative for chills and fever.  Eyes:  Negative for visual disturbance.  Respiratory:  Negative for shortness of breath and wheezing.   Cardiovascular:  Negative for chest pain and leg swelling.  Gastrointestinal:  Positive for abdominal distention. Negative for abdominal pain.  Musculoskeletal:  Negative for back pain and gait problem.  Skin:  Negative for rash.  Neurological:  Negative for dizziness, weakness and light-headedness.  Psychiatric/Behavioral:  Positive for sleep disturbance.   All other systems reviewed and are negative.   Per HPI unless specifically indicated above   Allergies as of 06/24/2021       Reactions   Eggs Or Egg-derived Products Swelling   Throat- 03-31-2019 pt states can eat cakes and Pie with no issues  Throat- 03-31-2019 pt  states can eat cakes and Pie with no issues    Galactose    Alpha- Gal    Septra [sulfamethoxazole-trimethoprim] Swelling   throat   Sulfonamide Derivatives Hives        Medication List        Accurate as of June 24, 2021  5:10 PM. If you have any questions, ask your nurse or doctor.          STOP taking these medications    ARIPiprazole 10 MG tablet Commonly known as: Abilify Stopped by: Worthy Rancher, MD   Meclizine HCl 25 MG Chew Stopped by: Fransisca Kaufmann Matthe Sloane, MD   naproxen 500 MG tablet Commonly known as: NAPROSYN Stopped by: Fransisca Kaufmann Breccan Galant, MD       TAKE these medications    albuterol 108 (90 Base) MCG/ACT inhaler Commonly known as: Ventolin HFA Inhale 2 puffs into the lungs every 4 (four) hours as needed. (Needs to be seen before next refill)   albuterol (2.5 MG/3ML) 0.083% nebulizer solution Commonly known as: PROVENTIL NEBULIZE 1 VIAL EVERY 6 HOURS AS NEEDED FOR WHEEZING & SHORTNESS OF BREATH   amitriptyline 50 MG tablet Commonly known as: ELAVIL Take 1 tablet (50 mg total) by mouth at bedtime. Started by: Fransisca Kaufmann Camillo Quadros, MD   azelastine 0.1 % nasal spray Commonly known as: ASTELIN USE 1 SPRAY IN EACH NOSTRIL  TWICE DAILY AS NEEDED   benzonatate 100 MG capsule Commonly known as: TESSALON Take 1 capsule (100 mg total) by mouth every 6 (six) hours as needed for cough.   Breztri Aerosphere 160-9-4.8 MCG/ACT Aero Generic drug: Budeson-Glycopyrrol-Formoterol Inhale 2 puffs into the lungs in the morning and at bedtime.   Contour Blood Glucose System w/Device Kit Test blood sugars four times daily   EPINEPHrine 0.3 mg/0.3 mL Soaj injection Commonly known as: EPI-PEN INJECT 0.3ML (0.3MG) IM ONCE   esomeprazole 40 MG capsule Commonly known as: NEXIUM TAKE 1 CAPSULE DAILY AT NOON   famotidine 20 MG tablet Commonly known as: PEPCID Take 1 tablet (20 mg total) by mouth 2 (two) times daily. What changed: when to take this    ferrous sulfate 325 (65 FE) MG tablet Take 325 mg by mouth daily with breakfast.   finasteride 5 MG tablet Commonly known as: PROSCAR TAKE ONE (1) TABLET EACH DAY   flecainide 50 MG tablet Commonly known as: TAMBOCOR Take 50 mg by mouth 2 (two) times daily.   fluticasone 50 MCG/ACT nasal spray Commonly known as: FLONASE USE 1 TO 2 SPRAYS IN EACH NOSTRIL DAILY   furosemide 20 MG tablet Commonly known as: LASIX Take 1 tablet (20 mg total) by mouth daily as needed.   glucose blood test strip Commonly known as: Contour Next Test Test blood sugars four times daily   losartan 50 MG tablet Commonly known as: COZAAR Take 1 tablet (50 mg total) by mouth daily.   meloxicam 7.5 MG tablet Commonly known as: MOBIC TAKE ONE (1) TABLET EACH DAY   metoprolol succinate 50 MG 24 hr tablet Commonly known as: TOPROL-XL Take 50 mg by mouth daily. Take with or immediately following a meal.   montelukast 10 MG tablet Commonly known as: SINGULAIR TAKE ONE TABLET DAILY AT BEDTIME   pravastatin 80 MG tablet Commonly known as: PRAVACHOL Take one tablet daily   sildenafil 20 MG tablet Commonly known as: REVATIO TAKE 1 TO 3 TABLETS DAILY AS NEEDED   Spacer/Aero-Holding Dorise Bullion Use with inhaler daily   tamsulosin 0.4 MG Caps capsule Commonly known as: FLOMAX Take one tablet after supper   tiZANidine 4 MG tablet Commonly known as: ZANAFLEX TAKE 1 TABLET BY MOUTH EVERY 6 HOURS AS NEEDED FOR MUSCLE SPAMS   Trulicity 1.42 LT/5.3UY Sopn Generic drug: Dulaglutide Inject 0.75 mg into the skin once a week.         Objective:   BP (!) 154/74   Pulse 73   Temp 98 F (36.7 C)   Ht 5' 8.5" (1.74 m)   Wt 196 lb (88.9 kg)   SpO2 100%   BMI 29.37 kg/m   Wt Readings from Last 3 Encounters:  06/24/21 196 lb (88.9 kg)  05/22/21 196 lb (88.9 kg)  02/22/21 193 lb (87.5 kg)    Physical Exam Vitals and nursing note reviewed.  Constitutional:      General: He is not in acute  distress.    Appearance: He is well-developed. He is not diaphoretic.  Eyes:     General: No scleral icterus.    Conjunctiva/sclera: Conjunctivae normal.  Neck:     Thyroid: No thyromegaly.  Abdominal:     General: Abdomen is flat. Bowel sounds are normal. There is no distension.     Tenderness: There is no abdominal tenderness. There is no guarding or rebound.     Hernia: A hernia (Right inguinal hernia, nontender, reducible) is present.  Musculoskeletal:  Cervical back: Neck supple.  Lymphadenopathy:     Cervical: No cervical adenopathy.  Skin:    General: Skin is warm and dry.     Findings: No rash.  Neurological:     Mental Status: He is alert and oriented to person, place, and time.     Coordination: Coordination normal.  Psychiatric:        Behavior: Behavior normal.       Assessment & Plan:   Problem List Items Addressed This Visit   None Visit Diagnoses     Right inguinal hernia    -  Primary   Relevant Orders   Ambulatory referral to General Surgery   Other insomnia       Relevant Medications   amitriptyline (ELAVIL) 50 MG tablet     We will try amitriptyline, referral to general surgery for hernia.  Follow up plan: Return if symptoms worsen or fail to improve.  Counseling provided for all of the vaccine components Orders Placed This Encounter  Procedures   Ambulatory referral to Johnny Opel Lejeune, MD [facility] 06/24/2021, 5:10 PM

## 2021-06-27 ENCOUNTER — Encounter: Payer: Self-pay | Admitting: "Endocrinology

## 2021-06-27 ENCOUNTER — Ambulatory Visit: Payer: BC Managed Care – PPO | Admitting: "Endocrinology

## 2021-06-27 VITALS — BP 126/84 | HR 68 | Ht 68.5 in | Wt 200.0 lb

## 2021-06-27 DIAGNOSIS — E291 Testicular hypofunction: Secondary | ICD-10-CM | POA: Diagnosis not present

## 2021-06-27 NOTE — Progress Notes (Unsigned)
06/27/2021     Endocrinology follow-up note    Johnny Mathews, 61 y.o., male   Chief Complaint  Patient presents with   Establish Care    Testosterone deficiency in male     Past Medical History:  Diagnosis Date   Allergy    Alpha galactosidase deficiency    Anemia    past hx    Arthritis    Asthma    Blood transfusion without reported diagnosis    BPH (benign prostatic hypertrophy)    Cataract    removed left eye    Complication of anesthesia    Diabetes mellitus without complication (HCC)    GERD (gastroesophageal reflux disease)    History of kidney stones    HOH (hard of hearing)    Hyperlipidemia    Hypertension    Irregular heart beat    Muscular dystrophy ([place])    PONV (postoperative nausea and vomiting)    Wears hearing aid in both ears    Past Surgical History:  Procedure Laterality Date   CATARACT EXTRACTION Left    COLONOSCOPY     ESOPHAGOGASTRODUODENOSCOPY N/A 08/27/2020   Procedure: ESOPHAGOGASTRODUODENOSCOPY (EGD);  Surgeon: Lajuana Matte, MD;  Location: [facility];  Service: Thoracic;  Laterality: N/A;   FEMUR FRACTURE SURGERY     INSERTION OF MESH N/A 08/27/2020   Procedure: INSERTION OF ACELL 7.5 x 6cm GENTRIX SURGICAL MATRIX HIATAL MESH;  Surgeon: Lajuana Matte, MD;  Location: [facility];  Service: Thoracic;  Laterality: N/A;   KIDNEY STONE SURGERY     x6   KNEE SURGERY Right    x2   MASS EXCISION Right 08/02/2018   Procedure: EXCISION RIGHT LONG FINGER MASS, DEBRIDEMENT PROXIMAL INTERPHALANGEAL JOINT WITH ROTATION FLAP;  Surgeon: Leanora Cover, MD;  Location: [facility];  Service: Orthopedics;  Laterality: Right;   NISSEN FUNDOPLICATION     [facility]    SHOULDER OPEN ROTATOR CUFF REPAIR     XI ROBOTIC ASSISTED HIATAL HERNIA REPAIR N/A 08/27/2020   Procedure: XI ROBOTIC ASSISTED REDO HIATAL HERNIA REPAIR;  Surgeon: Lajuana Matte, MD;  Location: [facility];  Service: Thoracic;   Laterality: N/A;   Social History   Socioeconomic History   Marital status: Married    Spouse name: Not on file   Number of children: Not on file   Years of education: Not on file   Highest education level: Not on file  Occupational History   Occupation: Physiological scientist  Tobacco Use   Smoking status: Former    Packs/day: 1.00    Years: 10.00    Total pack years: 10.00    Types: Cigarettes    Quit date: 01/06/1994    Years since quitting: 27.4   Smokeless tobacco: Current    Types: Snuff  Vaping Use   Vaping Use: Never used  Substance and Sexual Activity   Alcohol use: Yes    Alcohol/week: 2.0 - 3.0 standard drinks of alcohol    Types: 2 - 3 Standard drinks or equivalent per week    Comment: social   Drug use: No   Sexual activity: Not on file  Other Topics Concern   Not on file  Social History Narrative   Not on file   Social Determinants of Health   Financial Resource Strain: Not on file  Food Insecurity: Not on file  Transportation Needs: Not on file  Physical Activity: Not on file  Stress: Not on file  Social Connections: Not on file  Outpatient Encounter Medications as of 06/27/2021  Medication Sig   albuterol (PROVENTIL) (2.5 MG/3ML) 0.083% nebulizer solution NEBULIZE 1 VIAL EVERY 6 HOURS AS NEEDED FOR WHEEZING & SHORTNESS OF BREATH   albuterol (VENTOLIN HFA) 108 (90 Base) MCG/ACT inhaler Inhale 2 puffs into the lungs every 4 (four) hours as needed. (Needs to be seen before next refill)   amitriptyline (ELAVIL) 50 MG tablet Take 1 tablet (50 mg total) by mouth at bedtime.   azelastine (ASTELIN) 0.1 % nasal spray USE 1 SPRAY IN EACH NOSTRIL TWICE DAILY AS NEEDED   benzonatate (TESSALON) 100 MG capsule Take 1 capsule (100 mg total) by mouth every 6 (six) hours as needed for cough.   Blood Glucose Monitoring Suppl (CONTOUR BLOOD GLUCOSE SYSTEM) w/Device KIT Test blood sugars four times daily   Budeson-Glycopyrrol-Formoterol (BREZTRI AEROSPHERE) 160-9-4.8 MCG/ACT  AERO Inhale 2 puffs into the lungs in the morning and at bedtime.   Dulaglutide (TRULICITY) 0.30 SP/2.3RA SOPN Inject 0.75 mg into the skin once a week.   EPINEPHRINE 0.3 mg/0.3 mL IJ SOAJ injection INJECT 0.3ML (0.3MG) IM ONCE   esomeprazole (NEXIUM) 40 MG capsule TAKE 1 CAPSULE DAILY AT NOON   famotidine (PEPCID) 20 MG tablet Take 1 tablet (20 mg total) by mouth 2 (two) times daily. (Patient taking differently: Take 20 mg by mouth at bedtime.)   ferrous sulfate 325 (65 FE) MG tablet Take 325 mg by mouth daily with breakfast.   finasteride (PROSCAR) 5 MG tablet TAKE ONE (1) TABLET EACH DAY   flecainide (TAMBOCOR) 50 MG tablet Take 50 mg by mouth 2 (two) times daily.   fluticasone (FLONASE) 50 MCG/ACT nasal spray USE 1 TO 2 SPRAYS IN EACH NOSTRIL DAILY   furosemide (LASIX) 20 MG tablet Take 1 tablet (20 mg total) by mouth daily as needed.   glucose blood (CONTOUR NEXT TEST) test strip Test blood sugars four times daily   losartan (COZAAR) 50 MG tablet Take 1 tablet (50 mg total) by mouth daily.   meloxicam (MOBIC) 7.5 MG tablet TAKE ONE (1) TABLET EACH DAY   metoprolol succinate (TOPROL-XL) 50 MG 24 hr tablet Take 50 mg by mouth daily. Take with or immediately following a meal.   montelukast (SINGULAIR) 10 MG tablet TAKE ONE TABLET DAILY AT BEDTIME   pravastatin (PRAVACHOL) 80 MG tablet Take one tablet daily   sildenafil (REVATIO) 20 MG tablet TAKE 1 TO 3 TABLETS DAILY AS NEEDED   Spacer/Aero-Holding Chambers DEVI Use with inhaler daily   tamsulosin (FLOMAX) 0.4 MG CAPS capsule Take one tablet after supper   tiZANidine (ZANAFLEX) 4 MG tablet TAKE 1 TABLET BY MOUTH EVERY 6 HOURS AS NEEDED FOR MUSCLE SPAMS   No facility-administered encounter medications on file as of 06/27/2021.   ALLERGIES: Allergies  Allergen Reactions   Eggs Or Egg-Derived Products Swelling    Throat- 03-31-2019 pt states can eat cakes and Pie with no issues  Throat- 03-31-2019 pt states can eat cakes and Pie with no  issues    Galactose     Alpha- Gal    Septra [Sulfamethoxazole-Trimethoprim] Swelling    throat   Sulfonamide Derivatives Hives    VACCINATION STATUS: Immunization History  Administered Date(s) Administered   Influenza Inj Mdck Quad Pf 09/24/2018, 10/22/2020   Influenza, Quadrivalent, Recombinant, Inj, Pf 01/15/2017   Influenza-Unspecified 09/24/2018, 10/31/2019   Moderna SARS-COV2 Booster Vaccination 04/26/2020   Moderna Sars-Covid-2 Vaccination 01/15/2019, 02/12/2019, 09/07/2019   Pneumococcal Polysaccharide-23 11/09/2019   Tdap 07/20/2009, 11/09/2019   Zoster Recombinat (  Shingrix) 08/22/2020, 02/22/2021    HPI: Johnny Mathews is a 61 y.o.-year-old man.  He was seen in March 2020 in  consultation for hypogonadism requested by his PMD  Dettinger, Fransisca Kaufmann, MD.  -He is being engaged in telehealth via telephone for follow-up with repeat testosterone measurements. He reports that he has hypogonadism for as long as he remembers, does not father any biological children although he adopted 3 kids.   -He was treated with testosterone pellets in a clinic in [place] on 2 separate occasions first ,  2 years ago and last one 1 year ago, the dose in the details are not available to review.   -His previsit labs show lower testosterone last visit, mainly because of interactions in his dosing when he was COVID-19 infection. -He remains on AndroGel 40.50 mg subcutaneously daily-20.25 mg on each shoulders. -He reports improvement in his energy level, and libido. He denies  trauma to testes,  chemotherapy,  testicular irradiation,  nor genitourinary surgery. Denies new complaints since last visit.   He has type 2 diabetes on treatment with Victoza.  His most recent A1c was 5%. -His recent labs show normal CBC, PSA   -  He has asthma on various inhalers. No chronic pain. Not on opiates, does not take steroids.    He does not have family  Or personal history of premature  cardiac  disease.  ROS: Limited as above.  PE: BP 126/84   Pulse 68   Ht 5' 8.5" (1.74 m)   Wt 200 lb (90.7 kg)   BMI 29.97 kg/m  Wt Readings from Last 3 Encounters:  06/27/21 200 lb (90.7 kg)  06/24/21 196 lb (88.9 kg)  05/22/21 196 lb (88.9 kg)    Genital exam: normal male escutcheon, no inguinal LAD, normal phallus, significantly shrunk testes bilaterally to 5 mL, no testicular or scrotal mass lesions.  no penile discharge.  No gynecomastia.   CMP ( most recent) CMP     Component Value Date/Time   NA 144 05/22/2021 1400   K 5.3 (H) 05/22/2021 1400   CL 102 05/22/2021 1400   CO2 27 05/22/2021 1400   GLUCOSE 86 05/22/2021 1400   GLUCOSE 88 08/28/2020 0618   BUN 16 05/22/2021 1400   CREATININE 1.41 (H) 05/22/2021 1400   CALCIUM 9.7 05/22/2021 1400   PROT 6.8 05/22/2021 1400   ALBUMIN 4.5 05/22/2021 1400   AST 22 05/22/2021 1400   ALT 22 05/22/2021 1400   ALKPHOS 113 05/22/2021 1400   BILITOT 0.3 05/22/2021 1400   GFRNONAA >60 08/28/2020 0618   GFRAA 74 02/23/2020 0929     Diabetic Labs (most recent): Lab Results  Component Value Date   HGBA1C 4.9 05/22/2021   HGBA1C 4.9 02/22/2021   HGBA1C 5.2 08/22/2020     Lipid Panel ( most recent) Lipid Panel     Component Value Date/Time   CHOL 169 05/22/2021 1400   TRIG 180 (H) 05/22/2021 1400   HDL 58 05/22/2021 1400   CHOLHDL 2.9 05/22/2021 1400   CHOLHDL 4.8 03/30/2009 1807   VLDL 20 03/30/2009 1807   LDLCALC 81 05/22/2021 1400    Recent Results (from the past 2160 hour(s))  Bayer DCA Hb A1c Waived     Status: None   Collection Time: 05/22/21  1:57 PM  Result Value Ref Range   HB A1C (BAYER DCA - WAIVED) 4.9 4.8 - 5.6 %    Comment:          Prediabetes:  5.7 - 6.4          Diabetes: >6.4          Glycemic control for adults with diabetes: <7.0   CBC with Differential/Platelet     Status: Abnormal   Collection Time: 05/22/21  2:00 PM  Result Value Ref Range   WBC 6.2 3.4 - 10.8 x10E3/uL   RBC 3.43 (L) 4.14  - 5.80 x10E6/uL   Hemoglobin 10.7 (L) 13.0 - 17.7 g/dL   Hematocrit 32.4 (L) 37.5 - 51.0 %   MCV 95 79 - 97 fL   MCH 31.2 26.6 - 33.0 pg   MCHC 33.0 31.5 - 35.7 g/dL   RDW 12.6 11.6 - 15.4 %   Platelets 257 150 - 450 x10E3/uL   Neutrophils 57 Not Estab. %   Lymphs 27 Not Estab. %   Monocytes 10 Not Estab. %   Eos 5 Not Estab. %   Basos 1 Not Estab. %   Neutrophils Absolute 3.5 1.4 - 7.0 x10E3/uL   Lymphocytes Absolute 1.6 0.7 - 3.1 x10E3/uL   Monocytes Absolute 0.6 0.1 - 0.9 x10E3/uL   EOS (ABSOLUTE) 0.3 0.0 - 0.4 x10E3/uL   Basophils Absolute 0.0 0.0 - 0.2 x10E3/uL   Immature Granulocytes 0 Not Estab. %   Immature Grans (Abs) 0.0 0.0 - 0.1 x10E3/uL  CMP14+EGFR     Status: Abnormal   Collection Time: 05/22/21  2:00 PM  Result Value Ref Range   Glucose 86 70 - 99 mg/dL   BUN 16 8 - 27 mg/dL   Creatinine, Ser 1.41 (H) 0.76 - 1.27 mg/dL   eGFR 56 (L) >59 mL/min/1.73   BUN/Creatinine Ratio 11 10 - 24   Sodium 144 134 - 144 mmol/L   Potassium 5.3 (H) 3.5 - 5.2 mmol/L   Chloride 102 96 - 106 mmol/L   CO2 27 20 - 29 mmol/L   Calcium 9.7 8.6 - 10.2 mg/dL   Total Protein 6.8 6.0 - 8.5 g/dL   Albumin 4.5 3.8 - 4.8 g/dL   Globulin, Total 2.3 1.5 - 4.5 g/dL   Albumin/Globulin Ratio 2.0 1.2 - 2.2   Bilirubin Total 0.3 0.0 - 1.2 mg/dL   Alkaline Phosphatase 113 44 - 121 IU/L   AST 22 0 - 40 IU/L   ALT 22 0 - 44 IU/L  Lipid panel     Status: Abnormal   Collection Time: 05/22/21  2:00 PM  Result Value Ref Range   Cholesterol, Total 169 100 - 199 mg/dL   Triglycerides 180 (H) 0 - 149 mg/dL   HDL 58 >39 mg/dL   VLDL Cholesterol Cal 30 5 - 40 mg/dL   LDL Chol Calc (NIH) 81 0 - 99 mg/dL   Chol/HDL Ratio 2.9 0.0 - 5.0 ratio    Comment:                                   T. Chol/HDL Ratio                                             Men  Women                               1/2 Avg.Risk  3.4  3.3                                   Avg.Risk  5.0    4.4                                2X  Avg.Risk  9.6    7.1                                3X Avg.Risk 23.4   11.0      ASSESSMENT:  1. Hypogonadism 2.  Type 2 diabetes-A1c of 5%  PLAN:  Patient with inadequate response to AndroGel with total testosterone of   147 dropping from 271.  Historically he did have a testosterone level as low at 32.  -He wishes to be continued on testosterone replacement therapy. He will benefit from slight increase in his dose.  I discussed and prescribed AndroGel to 60.75 mg daily, 2 pumps on1 pump on another shoulder.  He is advised to alternate shoulders , every other day. -  I also discussed adverse effects of unnecessary testosterone replacement short-term and long-term.   -Given his medical history of sleep apnea and BPH which are relative contraindications for testosterone replacement, his testosterone dose will be titrated slowly based on his clinical response.    -He is already on PDE 5 inhibitors for ED.   When he recent A1c of 5%, he will be considered for treatment without medications for diabetes on his subsequent visit.        - Time spent on this patient care encounter:  25 minutes of which 50% was spent in  counseling and the rest reviewing  his current and  previous labs / studies and medications  doses and developing a plan for long term care. Johnny Mathews  participated in the discussions, expressed understanding, and voiced agreement with the above plans.  All questions were answered to his satisfaction. he is encouraged to contact clinic should he have any questions or concerns prior to his return visit.   Return in about 10 days (around 07/07/2021) for Fasting Labs  in AM B4 8.  Glade Lloyd, MD [facility] [address] [place], [place] [address] Phone: [phone]  Fax: [phone]   06/27/2021, 8:07 PM  This note was partially dictated with voice recognition software. Similar sounding words can be transcribed  inadequately or may not  be corrected upon review.

## 2021-06-28 ENCOUNTER — Other Ambulatory Visit: Payer: BC Managed Care – PPO

## 2021-06-28 DIAGNOSIS — E291 Testicular hypofunction: Secondary | ICD-10-CM | POA: Diagnosis not present

## 2021-07-01 DIAGNOSIS — Z95818 Presence of other cardiac implants and grafts: Secondary | ICD-10-CM | POA: Diagnosis not present

## 2021-07-01 DIAGNOSIS — Z91040 Latex allergy status: Secondary | ICD-10-CM | POA: Diagnosis not present

## 2021-07-01 DIAGNOSIS — Z6830 Body mass index (BMI) 30.0-30.9, adult: Secondary | ICD-10-CM | POA: Diagnosis not present

## 2021-07-01 DIAGNOSIS — R001 Bradycardia, unspecified: Secondary | ICD-10-CM | POA: Diagnosis not present

## 2021-07-01 DIAGNOSIS — I480 Paroxysmal atrial fibrillation: Secondary | ICD-10-CM | POA: Diagnosis not present

## 2021-07-01 DIAGNOSIS — I2510 Atherosclerotic heart disease of native coronary artery without angina pectoris: Secondary | ICD-10-CM | POA: Diagnosis not present

## 2021-07-01 DIAGNOSIS — I110 Hypertensive heart disease with heart failure: Secondary | ICD-10-CM | POA: Diagnosis not present

## 2021-07-01 DIAGNOSIS — Z79899 Other long term (current) drug therapy: Secondary | ICD-10-CM | POA: Diagnosis not present

## 2021-07-01 DIAGNOSIS — I509 Heart failure, unspecified: Secondary | ICD-10-CM | POA: Diagnosis not present

## 2021-07-01 DIAGNOSIS — Z882 Allergy status to sulfonamides status: Secondary | ICD-10-CM | POA: Diagnosis not present

## 2021-07-01 DIAGNOSIS — Z7901 Long term (current) use of anticoagulants: Secondary | ICD-10-CM | POA: Diagnosis not present

## 2021-07-01 DIAGNOSIS — K219 Gastro-esophageal reflux disease without esophagitis: Secondary | ICD-10-CM | POA: Diagnosis not present

## 2021-07-01 DIAGNOSIS — E119 Type 2 diabetes mellitus without complications: Secondary | ICD-10-CM | POA: Diagnosis not present

## 2021-07-01 DIAGNOSIS — Z87891 Personal history of nicotine dependence: Secondary | ICD-10-CM | POA: Diagnosis not present

## 2021-07-01 DIAGNOSIS — E785 Hyperlipidemia, unspecified: Secondary | ICD-10-CM | POA: Diagnosis not present

## 2021-07-03 ENCOUNTER — Other Ambulatory Visit: Payer: Self-pay | Admitting: Family Medicine

## 2021-07-03 LAB — CBC WITH DIFFERENTIAL/PLATELET
Basophils Absolute: 0 10*3/uL (ref 0.0–0.2)
Basos: 1 %
EOS (ABSOLUTE): 0.3 10*3/uL (ref 0.0–0.4)
Eos: 5 %
Hematocrit: 35.3 % — ABNORMAL LOW (ref 37.5–51.0)
Hemoglobin: 11.6 g/dL — ABNORMAL LOW (ref 13.0–17.7)
Immature Grans (Abs): 0 10*3/uL (ref 0.0–0.1)
Immature Granulocytes: 1 %
Lymphocytes Absolute: 1.3 10*3/uL (ref 0.7–3.1)
Lymphs: 25 %
MCH: 31.4 pg (ref 26.6–33.0)
MCHC: 32.9 g/dL (ref 31.5–35.7)
MCV: 95 fL (ref 79–97)
Monocytes Absolute: 0.5 10*3/uL (ref 0.1–0.9)
Monocytes: 10 %
Neutrophils Absolute: 3.1 10*3/uL (ref 1.4–7.0)
Neutrophils: 58 %
Platelets: 250 10*3/uL (ref 150–450)
RBC: 3.7 x10E6/uL — ABNORMAL LOW (ref 4.14–5.80)
RDW: 12 % (ref 11.6–15.4)
WBC: 5.3 10*3/uL (ref 3.4–10.8)

## 2021-07-03 LAB — LIPID PANEL
Chol/HDL Ratio: 2.8 ratio (ref 0.0–5.0)
Cholesterol, Total: 165 mg/dL (ref 100–199)
HDL: 59 mg/dL (ref >39)
LDL Chol Calc (NIH): 86 mg/dL (ref 0–99)
Triglycerides: 110 mg/dL (ref 0–149)
VLDL Cholesterol Cal: 20 mg/dL (ref 5–40)

## 2021-07-03 LAB — VITAMIN D 25 HYDROXY (VIT D DEFICIENCY, FRACTURES): Vit D, 25-Hydroxy: 38.3 ng/mL (ref 30.0–100.0)

## 2021-07-03 LAB — TESTOSTERONE, FREE, TOTAL, SHBG
Sex Hormone Binding: 75.7 nmol/L (ref 19.3–76.4)
Testosterone, Free: 1.6 pg/mL — ABNORMAL LOW (ref 6.6–18.1)
Testosterone: 61 ng/dL — ABNORMAL LOW (ref 264–916)

## 2021-07-03 LAB — PSA: Prostate Specific Ag, Serum: <0.1 ng/mL (ref 0.0–4.0)

## 2021-07-03 LAB — LUTEINIZING HORMONE: LH: 30.8 m[IU]/mL — ABNORMAL HIGH (ref 1.7–8.6)

## 2021-07-03 LAB — FOLLICLE STIMULATING HORMONE: FSH: 42.5 m[IU]/mL — ABNORMAL HIGH (ref 1.5–12.4)

## 2021-07-03 LAB — VITAMIN B12: Vitamin B-12: 575 pg/mL (ref 232–1245)

## 2021-07-03 LAB — FERRITIN: Ferritin: 111 ng/mL (ref 30–400)

## 2021-07-03 LAB — PROLACTIN: Prolactin: 7.3 ng/mL (ref 4.0–15.2)

## 2021-07-08 DIAGNOSIS — Z95818 Presence of other cardiac implants and grafts: Secondary | ICD-10-CM | POA: Diagnosis not present

## 2021-07-08 DIAGNOSIS — I480 Paroxysmal atrial fibrillation: Secondary | ICD-10-CM | POA: Diagnosis not present

## 2021-07-18 ENCOUNTER — Ambulatory Visit: Payer: BC Managed Care – PPO | Admitting: General Surgery

## 2021-07-18 ENCOUNTER — Encounter: Payer: Self-pay | Admitting: General Surgery

## 2021-07-18 VITALS — BP 114/72 | HR 57 | Temp 98.2°F | Resp 12 | Ht 68.5 in | Wt 197.0 lb

## 2021-07-18 DIAGNOSIS — K4020 Bilateral inguinal hernia, without obstruction or gangrene, not specified as recurrent: Secondary | ICD-10-CM | POA: Diagnosis not present

## 2021-07-18 NOTE — Patient Instructions (Addendum)
Inguinal Hernia, Adult An inguinal hernia develops when fat or the intestines push through a weak spot in a muscle where the leg meets the lower abdomen (groin). This creates a bulge. This kind of hernia could also be: In the scrotum, if you are male. In folds of skin around the vagina, if you are male. There are three types of inguinal hernias: Hernias that can be pushed back into the abdomen (are reducible). This type rarely causes pain. Hernias that are not reducible (are incarcerated). Hernias that are not reducible and lose their blood supply (are strangulated). This type of hernia requires emergency surgery. What are the causes? This condition is caused by having a weak spot in the muscles or tissues in your groin. This develops over time. The hernia may poke through the weak spot when you suddenly strain your lower abdominal muscles, such as when you: Lift a heavy object. Strain to have a bowel movement. Constipation can lead to straining. Cough. What increases the risk? This condition is more likely to develop in: Males. Pregnant females. People who: Are overweight. Work in jobs that require long periods of standing or heavy lifting. Have had an inguinal hernia before. Smoke or have lung disease. These factors can lead to long-term (chronic) coughing. What are the signs or symptoms? Symptoms may depend on the size of the hernia. Often, a small inguinal hernia has no symptoms. Symptoms of a larger hernia may include: A bulge in the groin area. This is easier to see when standing. It might not be visible when lying down. Pain or burning in the groin. This may get worse when lifting, straining, or coughing. A dull ache or a feeling of pressure in the groin. An unusual bulge in the scrotum, in males. Symptoms of a strangulated inguinal hernia may include: A bulge in your groin that is very painful and tender to the touch. A bulge that turns red or purple. Fever, nausea, and  vomiting. Inability to have a bowel movement or to pass gas. How is this diagnosed? This condition is diagnosed based on your symptoms, your medical history, and a physical exam. Your health care provider may feel your groin area and ask you to cough. How is this treated? Treatment depends on the size of your hernia and whether you have symptoms. If you do not have symptoms, your health care provider may have you watch your hernia carefully and have you come in for follow-up visits. If your hernia is large or if you have symptoms, you may need surgery to repair the hernia. Follow these instructions at home: Lifestyle Avoid lifting heavy objects. Avoid standing for long periods of time. Do not use any products that contain nicotine or tobacco. These products include cigarettes, chewing tobacco, and vaping devices, such as e-cigarettes. If you need help quitting, ask your health care provider. Maintain a healthy weight. Preventing constipation You may need to take these actions to prevent or treat constipation: Drink enough fluid to keep your urine pale yellow. Take over-the-counter or prescription medicines. Eat foods that are high in fiber, such as beans, whole grains, and fresh fruits and vegetables. Limit foods that are high in fat and processed sugars, such as fried or sweet foods. General instructions You may try to push the hernia back in place by very gently pressing on it while lying down. Do not try to force the bulge back in if it will not push in easily. Watch your hernia for any changes in shape, size, or   color. Get help right away if you notice any changes. Take over-the-counter and prescription medicines only as told by your health care provider. Keep all follow-up visits. This is important. Contact a health care provider if: You have a fever or chills. You develop new symptoms. Your symptoms get worse. Get help right away if: You have pain in your groin that suddenly gets  worse. You have a bulge in your groin that: Suddenly gets bigger and does not get smaller. Becomes red or purple or painful to the touch. You are a man and you have a sudden pain in your scrotum, or the size of your scrotum suddenly changes. You cannot push the hernia back in place by very gently pressing on it when you are lying down. You have nausea or vomiting that does not go away. You have a fast heartbeat. You cannot have a bowel movement or pass gas. These symptoms may represent a serious problem that is an emergency. Do not wait to see if the symptoms will go away. Get medical help right away. Call your local emergency services (911 in the [place] Summary An inguinal hernia develops when fat or the intestines push through a weak spot in a muscle where your leg meets your lower abdomen (groin). This condition is caused by having a weak spot in muscles or tissues in your groin. Symptoms may depend on the size of the hernia, and they may include pain or swelling in your groin. A small inguinal hernia often has no symptoms. Treatment may not be needed if you do not have symptoms. If you have symptoms or a large hernia, you may need surgery to repair the hernia. Avoid lifting heavy objects. Also, avoid standing for long periods of time. This information is not intended to replace advice given to you by your health care provider. Make sure you discuss any questions you have with your health care provide  Open Hernia Repair, Adult Open hernia repair is a surgical procedure to fix a hernia. A hernia occurs when an internal organ or tissue pushes through a weak spot in the muscles along the wall of the abdomen. Hernias commonly occur in the groin and around the belly button. Most hernias tend to get worse over time. Often, surgery is done to prevent the hernia from becoming bigger, uncomfortable, or an emergency. Emergency surgery may be needed if contents of the abdomen get stuck in the opening  (incarcerated hernia) or if the blood supply gets cut off (strangulated hernia). In an open repair, an incision is made in the abdomen to perform the surgery. Tell a health care provider about: Any allergies you have. All medicines you are taking, including vitamins, herbs, eye drops, creams, and over-the-counter medicines. Any problems you or family members have had with anesthetic medicines. Any blood or bone disorders you have. Any surgeries you have had. Any medical conditions you have, including any recent cold or flu (influenza)symptoms. Whether you are pregnant or may be pregnant. What are the risks? Generally, this is a safe procedure. However, problems may occur, including: Long-lasting (chronic) pain. Bleeding. Infection. Damage to the testicles. This can cause shrinking or swelling. Damage to nearby structures or organs, including the bladder, blood vessels, intestines, or nerves near the hernia. Blood clots. Trouble passing urine. Return of the hernia. What happens before the procedure? Medicines Ask your health care provider about: Changing or stopping your regular medicines. This is especially important if you are taking diabetes medicines or blood thinners. Taking medicines  such as aspirin and ibuprofen. These medicines can thin your blood. Do not take these medicines unless your health care provider tells you to take them. Taking over-the-counter medicines, vitamins, herbs, and supplements. Surgery safety Ask your health care provider: How your surgery site will be marked. What steps will be taken to help prevent infection. These steps may include: Removing hair at the surgery site. Washing skin with a germ-killing soap. Receiving antibiotic medicine. General instructions You may have an exam or testing, such as blood tests or imaging studies. Do not use any products that contain nicotine or tobacco for at least 4 weeks before the procedure. These products include  cigarettes, chewing tobacco, and vaping devices, such as e-cigarettes. If you need help quitting, ask your health care provider. Let your health care provider know if you develop a cold or any infection before your surgery. If you get an infection before surgery, you may receive antibiotics to treat it. Plan to have a responsible adult take you home from the hospital or clinic. If you will be going home right after the procedure, plan to have a responsible adult care for you for the time you are told. This is important. What happens during the procedure?  An IV will be inserted into one of your veins. You will be given one or more of the following: A medicine to help you relax (sedative). A medicine to numb the area (local anesthetic). A medicine to make you fall asleep (general anesthetic). Your surgeon will make an incision over the hernia. The tissues of the hernia will be moved back into place. The edges of the hernia may be stitched (sutured) together. The opening in the abdominal muscles will be closed with stitches (sutures). Or, your surgeon will place a mesh patch made of artificial (synthetic) material over the opening. The incision will be closed with sutures, skin glue, or adhesive strips. A bandage (dressing) may be placed over the incision. The procedure may vary among health care providers and hospitals. What happens after the procedure? Your blood pressure, heart rate, breathing rate, and blood oxygen level will be monitored until you leave the hospital or clinic. You may be given medicine for pain. If you were given a sedative during the procedure, it can affect you for several hours. Do not drive or operate machinery until your health care provider says that it is safe. Summary Open hernia repair is a surgical procedure to fix a hernia. Hernias commonly occur in the groin and around the belly button. Emergency surgery may be needed if contents of the abdomen get stuck in the  opening (incarcerated hernia) or if the blood supply gets cut off (strangulated hernia). In this procedure, an incision is made in the abdomen to perform the surgery. After the procedure, you may be given medicine for pain. This information is not intended to replace advice given to you by your health care provider. Make sure you discuss any questions you have with your health care provider. Document Revised: 08/08/2019 Document Reviewed: 08/08/2019 Elsevier Patient Education  [employer].

## 2021-07-18 NOTE — Progress Notes (Signed)
[facility] History and Physical  Reason for Referral: Right inguinal hernia  Referring Physician:  Dettinger, Fransisca Kaufmann, MD  Chief Complaint   New Patient (Initial Visit)     TEAGUE GOYNES is a 61 y.o. male.  HPI: Mr. Rote is a very sweet 62 yo who comes in with pain and more discomfort in the right groin for the last few weeks. He had a bilateral hernia repair 30 years ago at [place]. He says that he has not had any obstructive symptoms but keeps feeling the bulge and discomfort when he is up and active. He has not noted any pain on the left side.   He has a history of A fib and had an ablation and has been off anticoagulation since that time. He is followed by Cardiology and has a loop recorder they monitor periodically. He sees Cardiology at [facility]. 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    Past Medical History:  Diagnosis Date   Allergy    Alpha galactosidase deficiency    Anemia    past hx    Arthritis    Asthma    Blood transfusion without reported diagnosis    BPH (benign prostatic hypertrophy)    Cataract    removed left eye    Complication of anesthesia    Diabetes mellitus without complication (HCC)    GERD (gastroesophageal reflux disease)    History of kidney stones    HOH (hard of hearing)    Hyperlipidemia    Hypertension    Irregular heart beat    Muscular dystrophy ([place])    PONV (postoperative nausea and vomiting)    Wears hearing aid in both ears     Past Surgical History:  Procedure Laterality Date   CATARACT EXTRACTION Left    COLONOSCOPY     ESOPHAGOGASTRODUODENOSCOPY N/A 08/27/2020   Procedure: ESOPHAGOGASTRODUODENOSCOPY (EGD);  Surgeon: Lajuana Matte, MD;  Location: [place];  Service: Thoracic;  Laterality: N/A;   FEMUR FRACTURE SURGERY     INSERTION OF  MESH N/A 08/27/2020   Procedure: INSERTION OF ACELL 7.5 x 6cm GENTRIX SURGICAL MATRIX HIATAL MESH;  Surgeon: Lajuana Matte, MD;  Location: [place];  Service: Thoracic;  Laterality: N/A;   KIDNEY STONE SURGERY     x6   KNEE SURGERY Right    x2   MASS EXCISION Right 08/02/2018   Procedure: EXCISION RIGHT LONG FINGER MASS, DEBRIDEMENT PROXIMAL INTERPHALANGEAL JOINT WITH ROTATION FLAP;  Surgeon: Leanora Cover, MD;  Location: [place];  Service: Orthopedics;  Laterality: Right;   NISSEN FUNDOPLICATION     [facility]    SHOULDER OPEN ROTATOR CUFF REPAIR     XI ROBOTIC ASSISTED HIATAL HERNIA REPAIR N/A 08/27/2020   Procedure: XI ROBOTIC ASSISTED REDO HIATAL HERNIA REPAIR;  Surgeon: Lajuana Matte, MD;  Location: [place];  Service: Thoracic;  Laterality: N/A;    Family History  Problem Relation Age of Onset   Hypertension Mother    Breast cancer Mother    Heart disease Father    Breast cancer Sister    Colon cancer Neg Hx    Colon polyps Neg Hx    Esophageal cancer Neg Hx    Rectal cancer Neg Hx    Stomach cancer Neg Hx     Social History  Tobacco Use   Smoking status: Former    Packs/day: 1.00    Years: 10.00    Total pack years: 10.00    Types: Cigarettes    Quit date: 01/06/1994    Years since quitting: 27.5   Smokeless tobacco: Current    Types: Snuff  Vaping Use   Vaping Use: Never used  Substance Use Topics   Alcohol use: Yes    Alcohol/week: 2.0 - 3.0 standard drinks of alcohol    Types: 2 - 3 Standard drinks or equivalent per week    Comment: social   Drug use: No    Medications: I have reviewed the patient's current medications. Allergies as of 07/18/2021       Reactions   Eggs Or Egg-derived Products Swelling   Throat- 03-31-2019 pt states can eat cakes and Pie with no issues  Throat- 03-31-2019 pt states can eat cakes and Pie with no issues    Galactose    Alpha- Gal    Septra [sulfamethoxazole-trimethoprim] Swelling   throat    Sulfonamide Derivatives Hives        Medication List        Accurate as of July 18, 2021 10:31 AM. If you have any questions, ask your nurse or doctor.          albuterol 108 (90 Base) MCG/ACT inhaler Commonly known as: Ventolin HFA Inhale 2 puffs into the lungs every 4 (four) hours as needed. (Needs to be seen before next refill)   albuterol (2.5 MG/3ML) 0.083% nebulizer solution Commonly known as: PROVENTIL NEBULIZE 1 VIAL EVERY 6 HOURS AS NEEDED FOR WHEEZING & SHORTNESS OF BREATH   amitriptyline 50 MG tablet Commonly known as: ELAVIL Take 1 tablet (50 mg total) by mouth at bedtime.   azelastine 0.1 % nasal spray Commonly known as: ASTELIN USE 1 SPRAY IN EACH NOSTRIL TWICE DAILY AS NEEDED   benzonatate 100 MG capsule Commonly known as: TESSALON Take 1 capsule (100 mg total) by mouth every 6 (six) hours as needed for cough.   Breztri Aerosphere 160-9-4.8 MCG/ACT Aero Generic drug: Budeson-Glycopyrrol-Formoterol Inhale 2 puffs into the lungs in the morning and at bedtime.   Contour Blood Glucose System w/Device Kit Test blood sugars four times daily   EPINEPHrine 0.3 mg/0.3 mL Soaj injection Commonly known as: EPI-PEN INJECT 0.3ML (0.3MG) IM ONCE   esomeprazole 40 MG capsule Commonly known as: NEXIUM TAKE 1 CAPSULE DAILY AT NOON   famotidine 20 MG tablet Commonly known as: PEPCID Take 1 tablet (20 mg total) by mouth 2 (two) times daily. What changed: when to take this   ferrous sulfate 325 (65 FE) MG tablet Take 325 mg by mouth daily with breakfast.   finasteride 5 MG tablet Commonly known as: PROSCAR TAKE ONE (1) TABLET EACH DAY   flecainide 50 MG tablet Commonly known as: TAMBOCOR Take 50 mg by mouth 2 (two) times daily.   fluticasone 50 MCG/ACT nasal spray Commonly known as: FLONASE USE 1 TO 2 SPRAYS IN EACH NOSTRIL DAILY   furosemide 20 MG tablet Commonly known as: LASIX Take 1 tablet (20 mg total) by mouth daily as needed.   glucose blood  test strip Commonly known as: Contour Next Test Test blood sugars four times daily   losartan 50 MG tablet Commonly known as: COZAAR Take 1 tablet (50 mg total) by mouth daily.   meloxicam 7.5 MG tablet Commonly known as: MOBIC TAKE ONE (1) TABLET EACH DAY   metoprolol succinate 50 MG  24 hr tablet Commonly known as: TOPROL-XL Take 50 mg by mouth daily. Take with or immediately following a meal.   montelukast 10 MG tablet Commonly known as: SINGULAIR TAKE ONE TABLET DAILY AT BEDTIME   pravastatin 80 MG tablet Commonly known as: PRAVACHOL Take one tablet daily   sildenafil 20 MG tablet Commonly known as: REVATIO TAKE 1 TO 3 TABLETS DAILY AS NEEDED   Spacer/Aero-Holding Dorise Bullion Use with inhaler daily   tamsulosin 0.4 MG Caps capsule Commonly known as: FLOMAX Take one tablet after supper   tiZANidine 4 MG tablet Commonly known as: ZANAFLEX TAKE 1 TABLET BY MOUTH EVERY 6 HOURS AS NEEDED FOR MUSCLE SPAMS   Trulicity 7.26 OM/3.5DH Sopn Generic drug: Dulaglutide Inject 0.75 mg into the skin once a week.         ROS:  A comprehensive review of systems was negative except for: Gastrointestinal: positive for right groin hernia Musculoskeletal: positive for back pain and joint pain  Blood pressure 114/72, pulse (!) 57, temperature 98.2 F (36.8 C), temperature source Oral, resp. rate 12, height 5' 8.5" (1.74 m), weight 197 lb (89.4 kg), SpO2 97 %. Physical Exam Vitals reviewed.  Constitutional:      Appearance: Normal appearance.  HENT:     Head: Normocephalic.     Nose: Nose normal.     Mouth/Throat:     Mouth: Mucous membranes are moist.  Eyes:     Extraocular Movements: Extraocular movements intact.  Cardiovascular:     Rate and Rhythm: Normal rate and regular rhythm.  Pulmonary:     Effort: Pulmonary effort is normal.     Breath sounds: Normal breath sounds.  Abdominal:     General: There is no distension.     Palpations: Abdomen is soft.      Tenderness: There is no abdominal tenderness.     Hernia: A hernia is present. Hernia is present in the right inguinal area. There is no hernia in the left inguinal area.  Musculoskeletal:        General: Normal range of motion.  Skin:    General: Skin is warm.  Neurological:     General: No focal deficit present.     Mental Status: He is alert.  Psychiatric:        Mood and Affect: Mood normal.        Thought Content: Thought content normal.        Judgment: Judgment normal.     Results: None    Assessment & Plan:  RAOUL CIANO is a 61 y.o. male with a right inguinal hernia. He has never had issues with anesthesia regarding his myopathy. Dr. Kipp Brood repaired a Hiatal hernia in 08/2020.   Discussed the risk and benefits including, bleeding, infection, use of mesh, risk of recurrence, risk of nerve damage causing numbness or changes in sensation, risk of damage to the cord structures. The patient understands the risk and benefits of repair with mesh, and has decided to proceed.  We also discussed open versus laparoscopic surgery and the use of mesh. We discussed that I do open repairs with mesh, and that this is considered equivalent to laparoscopic surgery. We discussed reasons for opting for laparoscopic surgery including if a bilateral repair is needed or if a patient has a recurrence after an open repair.  All questions were answered to the satisfaction of the patient.  Will need 6-8 weeks out of work.   Virl Cagey 07/18/2021, 10:31 AM

## 2021-07-19 NOTE — H&P (Signed)
[facility] History and Physical  Reason for Referral: Right inguinal hernia  Referring Physician:  Dettinger, Fransisca Kaufmann, MD  Chief Complaint   New Patient (Initial Visit)     Johnny Mathews is a 61 y.o. male.  HPI: Johnny Mathews is a very sweet 61 yo who comes in with pain and more discomfort in the right groin for the last few weeks. He had a bilateral hernia repair 30 years ago at [place]. He says that he has not had any obstructive symptoms but keeps feeling the bulge and discomfort when he is up and active. He has not noted any pain on the left side.   He has a history of A fib and had an ablation and has been off anticoagulation since that time. He is followed by Cardiology and has a loop recorder they monitor periodically. He sees Cardiology at [facility]. 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    Past Medical History:  Diagnosis Date   Allergy    Alpha galactosidase deficiency    Anemia    past hx    Arthritis    Asthma    Blood transfusion without reported diagnosis    BPH (benign prostatic hypertrophy)    Cataract    removed left eye    Complication of anesthesia    Diabetes mellitus without complication (HCC)    GERD (gastroesophageal reflux disease)    History of kidney stones    HOH (hard of hearing)    Hyperlipidemia    Hypertension    Irregular heart beat    Muscular dystrophy ([place])    PONV (postoperative nausea and vomiting)    Wears hearing aid in both ears     Past Surgical History:  Procedure Laterality Date   CATARACT EXTRACTION Left    COLONOSCOPY     ESOPHAGOGASTRODUODENOSCOPY N/A 08/27/2020   Procedure: ESOPHAGOGASTRODUODENOSCOPY (EGD);  Surgeon: Lajuana Matte, MD;  Location: [place];  Service: Thoracic;  Laterality: N/A;   FEMUR FRACTURE SURGERY     INSERTION OF  MESH N/A 08/27/2020   Procedure: INSERTION OF ACELL 7.5 x 6cm GENTRIX SURGICAL MATRIX HIATAL MESH;  Surgeon: Lajuana Matte, MD;  Location: [place];  Service: Thoracic;  Laterality: N/A;   KIDNEY STONE SURGERY     x6   KNEE SURGERY Right    x2   MASS EXCISION Right 08/02/2018   Procedure: EXCISION RIGHT LONG FINGER MASS, DEBRIDEMENT PROXIMAL INTERPHALANGEAL JOINT WITH ROTATION FLAP;  Surgeon: Leanora Cover, MD;  [facility];  Service: Orthopedics;  Laterality: Right;   NISSEN FUNDOPLICATION     [facility]    SHOULDER OPEN ROTATOR CUFF REPAIR     XI ROBOTIC ASSISTED HIATAL HERNIA REPAIR N/A 08/27/2020   Procedure: XI ROBOTIC ASSISTED REDO HIATAL HERNIA REPAIR;  Surgeon: Lajuana Matte, MD;  Location: [place];  Service: Thoracic;  Laterality: N/A;    Family History  Problem Relation Age of Onset   Hypertension Mother    Breast cancer Mother    Heart disease Father    Breast cancer Sister    Colon cancer Neg Hx    Colon polyps Neg Hx    Esophageal cancer Neg Hx    Rectal cancer Neg Hx    Stomach cancer Neg Hx     Social History  Tobacco Use   Smoking status: Former    Packs/day: 1.00    Years: 10.00    Total pack years: 10.00    Types: Cigarettes    Quit date: 01/06/1994    Years since quitting: 27.5   Smokeless tobacco: Current    Types: Snuff  Vaping Use   Vaping Use: Never used  Substance Use Topics   Alcohol use: Yes    Alcohol/week: 2.0 - 3.0 standard drinks of alcohol    Types: 2 - 3 Standard drinks or equivalent per week    Comment: social   Drug use: No    Medications: I have reviewed the patient's current medications. Allergies as of 07/18/2021       Reactions   Eggs Or Egg-derived Products Swelling   Throat- 03-31-2019 pt states can eat cakes and Pie with no issues  Throat- 03-31-2019 pt states can eat cakes and Pie with no issues    Galactose    Alpha- Gal    Septra [sulfamethoxazole-trimethoprim] Swelling   throat    Sulfonamide Derivatives Hives        Medication List        Accurate as of July 18, 2021 10:31 AM. If you have any questions, ask your nurse or doctor.          albuterol 108 (90 Base) MCG/ACT inhaler Commonly known as: Ventolin HFA Inhale 2 puffs into the lungs every 4 (four) hours as needed. (Needs to be seen before next refill)   albuterol (2.5 MG/3ML) 0.083% nebulizer solution Commonly known as: PROVENTIL NEBULIZE 1 VIAL EVERY 6 HOURS AS NEEDED FOR WHEEZING & SHORTNESS OF BREATH   amitriptyline 50 MG tablet Commonly known as: ELAVIL Take 1 tablet (50 mg total) by mouth at bedtime.   azelastine 0.1 % nasal spray Commonly known as: ASTELIN USE 1 SPRAY IN EACH NOSTRIL TWICE DAILY AS NEEDED   benzonatate 100 MG capsule Commonly known as: TESSALON Take 1 capsule (100 mg total) by mouth every 6 (six) hours as needed for cough.   Breztri Aerosphere 160-9-4.8 MCG/ACT Aero Generic drug: Budeson-Glycopyrrol-Formoterol Inhale 2 puffs into the lungs in the morning and at bedtime.   Contour Blood Glucose System w/Device Kit Test blood sugars four times daily   EPINEPHrine 0.3 mg/0.3 mL Soaj injection Commonly known as: EPI-PEN INJECT 0.3ML (0.3MG) IM ONCE   esomeprazole 40 MG capsule Commonly known as: NEXIUM TAKE 1 CAPSULE DAILY AT NOON   famotidine 20 MG tablet Commonly known as: PEPCID Take 1 tablet (20 mg total) by mouth 2 (two) times daily. What changed: when to take this   ferrous sulfate 325 (65 FE) MG tablet Take 325 mg by mouth daily with breakfast.   finasteride 5 MG tablet Commonly known as: PROSCAR TAKE ONE (1) TABLET EACH DAY   flecainide 50 MG tablet Commonly known as: TAMBOCOR Take 50 mg by mouth 2 (two) times daily.   fluticasone 50 MCG/ACT nasal spray Commonly known as: FLONASE USE 1 TO 2 SPRAYS IN EACH NOSTRIL DAILY   furosemide 20 MG tablet Commonly known as: LASIX Take 1 tablet (20 mg total) by mouth daily as needed.   glucose blood  test strip Commonly known as: Contour Next Test Test blood sugars four times daily   losartan 50 MG tablet Commonly known as: COZAAR Take 1 tablet (50 mg total) by mouth daily.   meloxicam 7.5 MG tablet Commonly known as: MOBIC TAKE ONE (1) TABLET EACH DAY   metoprolol succinate 50 MG  24 hr tablet Commonly known as: TOPROL-XL Take 50 mg by mouth daily. Take with or immediately following a meal.   montelukast 10 MG tablet Commonly known as: SINGULAIR TAKE ONE TABLET DAILY AT BEDTIME   pravastatin 80 MG tablet Commonly known as: PRAVACHOL Take one tablet daily   sildenafil 20 MG tablet Commonly known as: REVATIO TAKE 1 TO 3 TABLETS DAILY AS NEEDED   Spacer/Aero-Holding Dorise Bullion Use with inhaler daily   tamsulosin 0.4 MG Caps capsule Commonly known as: FLOMAX Take one tablet after supper   tiZANidine 4 MG tablet Commonly known as: ZANAFLEX TAKE 1 TABLET BY MOUTH EVERY 6 HOURS AS NEEDED FOR MUSCLE SPAMS   Trulicity 3.00 TM/2.2QJ Sopn Generic drug: Dulaglutide Inject 0.75 mg into the skin once a week.         ROS:  A comprehensive review of systems was negative except for: Gastrointestinal: positive for right groin hernia Musculoskeletal: positive for back pain and joint pain  Blood pressure 114/72, pulse (!) 57, temperature 98.2 F (36.8 C), temperature source Oral, resp. rate 12, height 5' 8.5" (1.74 m), weight 197 lb (89.4 kg), SpO2 97 %. Physical Exam Vitals reviewed.  Constitutional:      Appearance: Normal appearance.  HENT:     Head: Normocephalic.     Nose: Nose normal.     Mouth/Throat:     Mouth: Mucous membranes are moist.  Eyes:     Extraocular Movements: Extraocular movements intact.  Cardiovascular:     Rate and Rhythm: Normal rate and regular rhythm.  Pulmonary:     Effort: Pulmonary effort is normal.     Breath sounds: Normal breath sounds.  Abdominal:     General: There is no distension.     Palpations: Abdomen is soft.      Tenderness: There is no abdominal tenderness.     Hernia: A hernia is present. Hernia is present in the right inguinal area. There is no hernia in the left inguinal area.  Musculoskeletal:        General: Normal range of motion.  Skin:    General: Skin is warm.  Neurological:     General: No focal deficit present.     Mental Status: He is alert.  Psychiatric:        Mood and Affect: Mood normal.        Thought Content: Thought content normal.        Judgment: Judgment normal.     Results: None  Reviewed CT from 2016 at [facility] and had bilateral groin hernias then with fat   Assessment & Plan:  Johnny Mathews is a 61 y.o. male with a right inguinal hernia. He has never had issues with anesthesia regarding his myopathy. Dr. Kipp Brood repaired a Hiatal hernia in 08/2020.  Has a left inguinal hernia on CT from 2016 but nothing on exam.   Discussed the risk and benefits including, bleeding, infection, use of mesh, risk of recurrence, risk of nerve damage causing numbness or changes in sensation, risk of damage to the cord structures. The patient understands the risk and benefits of repair with mesh, and has decided to proceed.  We also discussed open versus laparoscopic surgery and the use of mesh. We discussed that I do open repairs with mesh, and that this is considered equivalent to laparoscopic surgery. We discussed reasons for opting for laparoscopic surgery including if a bilateral repair is needed or if a patient has a recurrence after an open repair.  All questions were answered to the satisfaction of the patient.  Will need 6-8 weeks out of work.     Virl Cagey 07/18/2021, 10:31 AM

## 2021-07-22 ENCOUNTER — Ambulatory Visit: Payer: BC Managed Care – PPO | Admitting: "Endocrinology

## 2021-07-22 ENCOUNTER — Encounter: Payer: Self-pay | Admitting: "Endocrinology

## 2021-07-22 VITALS — BP 130/68 | HR 60 | Ht 68.5 in | Wt 200.2 lb

## 2021-07-22 DIAGNOSIS — E291 Testicular hypofunction: Secondary | ICD-10-CM

## 2021-07-22 MED ORDER — TESTOSTERONE CYPIONATE 100 MG/ML IM SOLN: 50.0000 mg | INTRAMUSCULAR | 0 refills | Status: DC

## 2021-07-22 MED ORDER — "SYRINGE/NEEDLE (DISP) 21G X 1-1/2"" 3 ML MISC": 0 refills | Status: AC

## 2021-07-22 NOTE — Progress Notes (Signed)
07/22/2021     Endocrinology follow-up note   Johnny Mathews, 61 y.o., male   Chief Complaint  Patient presents with   Follow-up    Hypogonadism, male     Past Medical History:  Diagnosis Date   Allergy    Alpha galactosidase deficiency    Anemia    past hx    Arthritis    Asthma    Blood transfusion without reported diagnosis    BPH (benign prostatic hypertrophy)    Cataract    removed left eye    Complication of anesthesia    Diabetes mellitus without complication (HCC)    GERD (gastroesophageal reflux disease)    History of kidney stones    HOH (hard of hearing)    Hyperlipidemia    Hypertension    Irregular heart beat    Muscular dystrophy ([place])    PONV (postoperative nausea and vomiting)    Wears hearing aid in both ears    Past Surgical History:  Procedure Laterality Date   CATARACT EXTRACTION Left    COLONOSCOPY     ESOPHAGOGASTRODUODENOSCOPY N/A 08/27/2020   Procedure: ESOPHAGOGASTRODUODENOSCOPY (EGD);  Surgeon: Lajuana Matte, MD;  Location: [facility];  Service: Thoracic;  Laterality: N/A;   FEMUR FRACTURE SURGERY     INSERTION OF MESH N/A 08/27/2020   Procedure: INSERTION OF ACELL 7.5 x 6cm GENTRIX SURGICAL MATRIX HIATAL MESH;  Surgeon: Lajuana Matte, MD;  Location: [facility];  Service: Thoracic;  Laterality: N/A;   KIDNEY STONE SURGERY     x6   KNEE SURGERY Right    x2   MASS EXCISION Right 08/02/2018   Procedure: EXCISION RIGHT LONG FINGER MASS, DEBRIDEMENT PROXIMAL INTERPHALANGEAL JOINT WITH ROTATION FLAP;  Surgeon: Leanora Cover, MD;  Location: [facility];  Service: Orthopedics;  Laterality: Right;   NISSEN FUNDOPLICATION     [facility]    SHOULDER OPEN ROTATOR CUFF REPAIR     XI ROBOTIC ASSISTED HIATAL HERNIA REPAIR N/A 08/27/2020   Procedure: XI ROBOTIC ASSISTED REDO HIATAL HERNIA REPAIR;  Surgeon: Lajuana Matte, MD;  Location: [facility];  Service: Thoracic;  Laterality: N/A;    Social History   Socioeconomic History   Marital status: Married    Spouse name: Not on file   Number of children: Not on file   Years of education: Not on file   Highest education level: Not on file  Occupational History   Occupation: Physiological scientist  Tobacco Use   Smoking status: Former    Packs/day: 1.00    Years: 10.00    Total pack years: 10.00    Types: Cigarettes    Quit date: 01/06/1994    Years since quitting: 27.5   Smokeless tobacco: Current    Types: Snuff  Vaping Use   Vaping Use: Never used  Substance and Sexual Activity   Alcohol use: Yes    Alcohol/week: 2.0 - 3.0 standard drinks of alcohol    Types: 2 - 3 Standard drinks or equivalent per week    Comment: social   Drug use: No   Sexual activity: Not on file  Other Topics Concern   Not on file  Social History Narrative   Not on file   Social Determinants of Health   Financial Resource Strain: Not on file  Food Insecurity: Not on file  Transportation Needs: Not on file  Physical Activity: Not on file  Stress: Not on file  Social Connections: Not on file   Outpatient Encounter  Medications as of 07/22/2021  Medication Sig   SYRINGE-NEEDLE, DISP, 3 ML 21G X 1-1/2" 3 ML MISC Use to inject testosterone every week   testosterone cypionate (DEPOTESTOTERONE CYPIONATE) 100 MG/ML injection Inject 0.5 mLs (50 mg total) into the muscle every 7 (seven) days. For IM use only   albuterol (PROVENTIL) (2.5 MG/3ML) 0.083% nebulizer solution NEBULIZE 1 VIAL EVERY 6 HOURS AS NEEDED FOR WHEEZING & SHORTNESS OF BREATH   albuterol (VENTOLIN HFA) 108 (90 Base) MCG/ACT inhaler Inhale 2 puffs into the lungs every 4 (four) hours as needed. (Needs to be seen before next refill)   amitriptyline (ELAVIL) 50 MG tablet Take 1 tablet (50 mg total) by mouth at bedtime.   azelastine (ASTELIN) 0.1 % nasal spray USE 1 SPRAY IN EACH NOSTRIL TWICE DAILY AS NEEDED   benzonatate (TESSALON) 100 MG capsule Take 1 capsule (100 mg total) by  mouth every 6 (six) hours as needed for cough.   Blood Glucose Monitoring Suppl (CONTOUR BLOOD GLUCOSE SYSTEM) w/Device KIT Test blood sugars four times daily   Budeson-Glycopyrrol-Formoterol (BREZTRI AEROSPHERE) 160-9-4.8 MCG/ACT AERO Inhale 2 puffs into the lungs in the morning and at bedtime.   Dulaglutide (TRULICITY) 8.67 YP/9.5KD SOPN Inject 0.75 mg into the skin once a week.   EPINEPHRINE 0.3 mg/0.3 mL IJ SOAJ injection INJECT 0.3ML (0.3MG) IM ONCE   esomeprazole (NEXIUM) 40 MG capsule TAKE 1 CAPSULE DAILY AT NOON   famotidine (PEPCID) 20 MG tablet Take 1 tablet (20 mg total) by mouth 2 (two) times daily. (Patient taking differently: Take 20 mg by mouth at bedtime.)   ferrous sulfate 325 (65 FE) MG tablet Take 325 mg by mouth daily with breakfast.   finasteride (PROSCAR) 5 MG tablet TAKE ONE (1) TABLET EACH DAY   flecainide (TAMBOCOR) 50 MG tablet Take 50 mg by mouth 2 (two) times daily.   fluticasone (FLONASE) 50 MCG/ACT nasal spray USE 1 TO 2 SPRAYS IN EACH NOSTRIL DAILY   furosemide (LASIX) 20 MG tablet Take 1 tablet (20 mg total) by mouth daily as needed.   glucose blood (CONTOUR NEXT TEST) test strip Test blood sugars four times daily   losartan (COZAAR) 50 MG tablet Take 1 tablet (50 mg total) by mouth daily.   meloxicam (MOBIC) 7.5 MG tablet TAKE ONE (1) TABLET EACH DAY   metoprolol succinate (TOPROL-XL) 50 MG 24 hr tablet Take 50 mg by mouth daily. Take with or immediately following a meal.   montelukast (SINGULAIR) 10 MG tablet TAKE ONE TABLET DAILY AT BEDTIME   pravastatin (PRAVACHOL) 80 MG tablet Take one tablet daily   sildenafil (REVATIO) 20 MG tablet TAKE 1 TO 3 TABLETS DAILY AS NEEDED   Spacer/Aero-Holding Chambers DEVI Use with inhaler daily   tamsulosin (FLOMAX) 0.4 MG CAPS capsule Take one tablet after supper   [DISCONTINUED] tiZANidine (ZANAFLEX) 4 MG tablet TAKE 1 TABLET BY MOUTH EVERY 6 HOURS AS NEEDED FOR MUSCLE SPAMS   No facility-administered encounter medications  on file as of 07/22/2021.   ALLERGIES: Allergies  Allergen Reactions   Eggs Or Egg-Derived Products Swelling    Throat- 03-31-2019 pt states can eat cakes and Pie with no issues  Throat- 03-31-2019 pt states can eat cakes and Pie with no issues    Galactose     Alpha- Gal    Septra [Sulfamethoxazole-Trimethoprim] Swelling    throat   Sulfonamide Derivatives Hives    VACCINATION STATUS: Immunization History  Administered Date(s) Administered   Influenza Inj Mdck Quad Pf 09/24/2018,  10/22/2020   Influenza, Quadrivalent, Recombinant, Inj, Pf 01/15/2017   Influenza-Unspecified 09/24/2018, 10/31/2019   Moderna SARS-COV2 Booster Vaccination 04/26/2020   Moderna Sars-Covid-2 Vaccination 01/15/2019, 02/12/2019, 09/07/2019   Pneumococcal Polysaccharide-23 11/09/2019   Tdap 07/20/2009, 11/09/2019   Zoster Recombinat (Shingrix) 08/22/2020, 02/22/2021    HPI: Johnny Mathews is a 61 y.o.-year-old man.  He is returning for a follow-up after he was seen in consultation for hypogonadism.  He does not have new complaints today. He notes from previous visits. He reports that he has hypogonadism for as long as he remembers, does not father any biological children although he adopted 3 kids.  He reports to have been told that he cannot follow any children due to low sperm count. -He was treated with testosterone pellets in a clinic in [place] on 2 separate occasions between 2018 and 2019 , the dose details are not available to review.   His previsit labs show total testosterone low at 66. He denies  trauma to testes,  chemotherapy,  testicular irradiation,  nor genitourinary surgery. Denies new complaints since last visit.  He wishes to be resumed on testosterone treatment. -His recent labs show normal CBC, PSA   -  He has asthma on various inhalers. No chronic pain. Not on opiates, does not take steroids.    He does not have family  Or personal history of premature  cardiac disease.  ROS:  Limited as above.  PE: BP 130/68   Pulse 60   Ht 5' 8.5" (1.74 m)   Wt 200 lb 3.2 oz (90.8 kg)   BMI 30.00 kg/m  Wt Readings from Last 3 Encounters:  07/22/21 200 lb 3.2 oz (90.8 kg)  07/18/21 197 lb (89.4 kg)  06/27/21 200 lb (90.7 kg)    Genital exam: normal male escutcheon, no inguinal LAD, normal phallus, significantly shrunk testes bilaterally to 5 mL, no testicular or scrotal mass lesions.  no penile discharge.  No gynecomastia.   CMP ( most recent) CMP     Component Value Date/Time   NA 144 05/22/2021 1400   K 5.3 (H) 05/22/2021 1400   CL 102 05/22/2021 1400   CO2 27 05/22/2021 1400   GLUCOSE 86 05/22/2021 1400   GLUCOSE 88 08/28/2020 0618   BUN 16 05/22/2021 1400   CREATININE 1.41 (H) 05/22/2021 1400   CALCIUM 9.7 05/22/2021 1400   PROT 6.8 05/22/2021 1400   ALBUMIN 4.5 05/22/2021 1400   AST 22 05/22/2021 1400   ALT 22 05/22/2021 1400   ALKPHOS 113 05/22/2021 1400   BILITOT 0.3 05/22/2021 1400   GFRNONAA >60 08/28/2020 0618   GFRAA 74 02/23/2020 0929     Diabetic Labs (most recent): Lab Results  Component Value Date   HGBA1C 4.9 05/22/2021   HGBA1C 4.9 02/22/2021   HGBA1C 5.2 08/22/2020     Lipid Panel ( most recent) Lipid Panel     Component Value Date/Time   CHOL 165 06/28/2021 0807   TRIG 110 06/28/2021 0807   HDL 59 06/28/2021 0807   CHOLHDL 2.8 06/28/2021 0807   CHOLHDL 4.8 03/30/2009 1807   VLDL 20 03/30/2009 1807   LDLCALC 86 06/28/2021 0807    Recent Results (from the past 2160 hour(s))  Bayer DCA Hb A1c Waived     Status: None   Collection Time: 05/22/21  1:57 PM  Result Value Ref Range   HB A1C (BAYER DCA - WAIVED) 4.9 4.8 - 5.6 %    Comment:  Prediabetes: 5.7 - 6.4          Diabetes: >6.4          Glycemic control for adults with diabetes: <7.0   CBC with Differential/Platelet     Status: Abnormal   Collection Time: 05/22/21  2:00 PM  Result Value Ref Range   WBC 6.2 3.4 - 10.8 x10E3/uL   RBC 3.43 (L) 4.14 - 5.80  x10E6/uL   Hemoglobin 10.7 (L) 13.0 - 17.7 g/dL   Hematocrit 32.4 (L) 37.5 - 51.0 %   MCV 95 79 - 97 fL   MCH 31.2 26.6 - 33.0 pg   MCHC 33.0 31.5 - 35.7 g/dL   RDW 12.6 11.6 - 15.4 %   Platelets 257 150 - 450 x10E3/uL   Neutrophils 57 Not Estab. %   Lymphs 27 Not Estab. %   Monocytes 10 Not Estab. %   Eos 5 Not Estab. %   Basos 1 Not Estab. %   Neutrophils Absolute 3.5 1.4 - 7.0 x10E3/uL   Lymphocytes Absolute 1.6 0.7 - 3.1 x10E3/uL   Monocytes Absolute 0.6 0.1 - 0.9 x10E3/uL   EOS (ABSOLUTE) 0.3 0.0 - 0.4 x10E3/uL   Basophils Absolute 0.0 0.0 - 0.2 x10E3/uL   Immature Granulocytes 0 Not Estab. %   Immature Grans (Abs) 0.0 0.0 - 0.1 x10E3/uL  CMP14+EGFR     Status: Abnormal   Collection Time: 05/22/21  2:00 PM  Result Value Ref Range   Glucose 86 70 - 99 mg/dL   BUN 16 8 - 27 mg/dL   Creatinine, Ser 1.41 (H) 0.76 - 1.27 mg/dL   eGFR 56 (L) >59 mL/min/1.73   BUN/Creatinine Ratio 11 10 - 24   Sodium 144 134 - 144 mmol/L   Potassium 5.3 (H) 3.5 - 5.2 mmol/L   Chloride 102 96 - 106 mmol/L   CO2 27 20 - 29 mmol/L   Calcium 9.7 8.6 - 10.2 mg/dL   Total Protein 6.8 6.0 - 8.5 g/dL   Albumin 4.5 3.8 - 4.8 g/dL   Globulin, Total 2.3 1.5 - 4.5 g/dL   Albumin/Globulin Ratio 2.0 1.2 - 2.2   Bilirubin Total 0.3 0.0 - 1.2 mg/dL   Alkaline Phosphatase 113 44 - 121 IU/L   AST 22 0 - 40 IU/L   ALT 22 0 - 44 IU/L  Lipid panel     Status: Abnormal   Collection Time: 05/22/21  2:00 PM  Result Value Ref Range   Cholesterol, Total 169 100 - 199 mg/dL   Triglycerides 180 (H) 0 - 149 mg/dL   HDL 58 >39 mg/dL   VLDL Cholesterol Cal 30 5 - 40 mg/dL   LDL Chol Calc (NIH) 81 0 - 99 mg/dL   Chol/HDL Ratio 2.9 0.0 - 5.0 ratio    Comment:                                   T. Chol/HDL Ratio                                             Men  Women                               1/2 Avg.Risk  3.4  3.3                                   Avg.Risk  5.0    4.4                                2X Avg.Risk   9.6    7.1                                3X Avg.Risk 23.4   11.0   Prolactin     Status: None   Collection Time: 06/28/21  8:07 AM  Result Value Ref Range   Prolactin 7.3 4.0 - 15.2 ng/mL  Ferritin     Status: None   Collection Time: 06/28/21  8:07 AM  Result Value Ref Range   Ferritin 111 30 - 400 ng/mL  CBC with Differential/Platelet     Status: Abnormal   Collection Time: 06/28/21  8:07 AM  Result Value Ref Range   WBC 5.3 3.4 - 10.8 x10E3/uL   RBC 3.70 (L) 4.14 - 5.80 x10E6/uL   Hemoglobin 11.6 (L) 13.0 - 17.7 g/dL   Hematocrit 35.3 (L) 37.5 - 51.0 %   MCV 95 79 - 97 fL   MCH 31.4 26.6 - 33.0 pg   MCHC 32.9 31.5 - 35.7 g/dL   RDW 12.0 11.6 - 15.4 %   Platelets 250 150 - 450 x10E3/uL   Neutrophils 58 Not Estab. %   Lymphs 25 Not Estab. %   Monocytes 10 Not Estab. %   Eos 5 Not Estab. %   Basos 1 Not Estab. %   Neutrophils Absolute 3.1 1.4 - 7.0 x10E3/uL   Lymphocytes Absolute 1.3 0.7 - 3.1 x10E3/uL   Monocytes Absolute 0.5 0.1 - 0.9 x10E3/uL   EOS (ABSOLUTE) 0.3 0.0 - 0.4 x10E3/uL   Basophils Absolute 0.0 0.0 - 0.2 x10E3/uL   Immature Granulocytes 1 Not Estab. %   Immature Grans (Abs) 0.0 0.0 - 0.1 x10E3/uL  PSA     Status: None   Collection Time: 06/28/21  8:07 AM  Result Value Ref Range   Prostate Specific Ag, Serum <0.1 0.0 - 4.0 ng/mL    Comment: Roche ECLIA methodology. According to the American Urological Association, Serum PSA should decrease and remain at undetectable levels after radical prostatectomy. The AUA defines biochemical recurrence as an initial PSA value 0.2 ng/mL or greater followed by a subsequent confirmatory PSA value 0.2 ng/mL or greater. Values obtained with different assay methods or kits cannot be used interchangeably. Results cannot be interpreted as absolute evidence of the presence or absence of malignant disease.   Testosterone, Free, Total, SHBG     Status: Abnormal   Collection Time: 06/28/21  8:07 AM  Result Value Ref Range    Testosterone 61 (L) 264 - 916 ng/dL    Comment: Adult male reference interval is based on a population of healthy nonobese males (BMI <30) between 39 and 4 years old. [place], [place] [phone]. PMID: [id number].    Testosterone, Free 1.6 (L) 6.6 - 18.1 pg/mL   Sex Hormone Binding 75.7 19.3 - 76.4 nmol/L  Luteinizing hormone     Status: Abnormal   Collection Time: 06/28/21  8:07 AM  Result Value Ref Range   LH 30.8 (H) 1.7 - 8.6  mIU/mL  Follicle stimulating hormone     Status: Abnormal   Collection Time: 06/28/21  8:07 AM  Result Value Ref Range   FSH 42.5 (H) 1.5 - 12.4 mIU/mL  Lipid panel     Status: None   Collection Time: 06/28/21  8:07 AM  Result Value Ref Range   Cholesterol, Total 165 100 - 199 mg/dL   Triglycerides 110 0 - 149 mg/dL   HDL 59 >39 mg/dL   VLDL Cholesterol Cal 20 5 - 40 mg/dL   LDL Chol Calc (NIH) 86 0 - 99 mg/dL   Chol/HDL Ratio 2.8 0.0 - 5.0 ratio    Comment:                                   T. Chol/HDL Ratio                                             Men  Women                               1/2 Avg.Risk  3.4    3.3                                   Avg.Risk  5.0    4.4                                2X Avg.Risk  9.6    7.1                                3X Avg.Risk 23.4   11.0   VITAMIN D 25 Hydroxy (Vit-D Deficiency, Fractures)     Status: None   Collection Time: 06/28/21  8:07 AM  Result Value Ref Range   Vit D, 25-Hydroxy 38.3 30.0 - 100.0 ng/mL    Comment: Vitamin D deficiency has been defined by the [place] practice guideline as a level of serum 25-OH vitamin D less than 20 ng/mL (1,2). The Endocrine Society went on to further define vitamin D insufficiency as a level between 21 and 29 ng/mL (2). 1. IOM (Institute of Medicine). 2010. Dietary reference    intakes for calcium and D. [place]: The    [employer]. 2. Holick MF, Binkley [place], Bischoff-Ferrari HA, et al.     Evaluation, treatment, and prevention of vitamin D    deficiency: an Endocrine Society clinical practice    guideline. JCEM. 2011 Jul; 96(7):1911-30.   Vitamin B12     Status: None   Collection Time: 06/28/21  8:07 AM  Result Value Ref Range   Vitamin B-12 575 232 - 1,245 pg/mL     ASSESSMENT:  1. Hypogonadism   PLAN:  See notes from previous visits.  He returns with persistent hypogonadism with total testosterone of 66.  He wishes to be resumed on testosterone replacement.  He prefers injectable options.  I discussed and initiated testosterone 50 mg IM every 7 days with a plan to give him 200 mg a  month until next measurement. -  I discussed adverse effects of unnecessary testosterone replacement short-term and long-term.  In this patient with bilaterally shrunk testicles, his hypogonadism is likely chronic and primary. I discussed with him the fact that testosterone replacement will further diminish his chance of fertility.  At this point, he is not interested to keep his fertility.  -Given his medical history of sleep apnea and BPH which are relative contraindications for testosterone replacement, his testosterone dose will be titrated slowly based on his clinical response.    -He is already on PDE 5 inhibitors for ED.   Did have history of diabetes, which seems to have reversed.  His A1c was 4.9% in May 2023.  He will not be considered for treatment at this time for diabetes.  He is encouraged to keep close follow-up with his PMD.   I spent 22 minutes in the care of the patient today including review of labs from Thyroid Function, CMP, and other relevant labs ; imaging/biopsy records (current and previous including abstractions from other facilities); face-to-face time discussing  his lab results and symptoms, medications doses, his options of short and long term treatment based on the latest standards of care / guidelines;   and documenting the encounter.  Johnny Mathews   participated in the discussions, expressed understanding, and voiced agreement with the above plans.  All questions were answered to his satisfaction. he is encouraged to contact clinic should he have any questions or concerns prior to his return visit.    Return in about 3 months (around 10/22/2021) for Fasting Labs  in AM B4 8.  Glade Lloyd, MD [facility] [address] [place], [place] [address] Phone: [phone]  Fax: [phone]   07/22/2021, 6:28 PM  This note was partially dictated with voice recognition software. Similar sounding words can be transcribed inadequately or may not  be corrected upon review.

## 2021-07-30 DIAGNOSIS — Z4509 Encounter for adjustment and management of other cardiac device: Secondary | ICD-10-CM | POA: Diagnosis not present

## 2021-07-31 ENCOUNTER — Telehealth: Payer: Self-pay

## 2021-07-31 NOTE — Chronic Care Management (AMB) (Signed)
  Care Management   Outreach Note  07/31/2021 Name: Johnny Mathews MRN: [id number] DOB: Oct 03, 1960  An unsuccessful telephone outreach was attempted today. The patient was referred to the case management team for assistance with care management and care coordination.   Follow Up Plan:  A HIPAA compliant phone message was left for the patient providing contact information and requesting a return call.  The care management team will reach out to the patient again over the next 7 days.  If patient returns call to provider office, please advise to call [facility] * at [phone] Larsson, [place], [place] [address] Direct Dial: [phone] [email or web address]

## 2021-08-01 NOTE — Patient Instructions (Signed)
Johnny Mathews  08/01/2021     '@PREFPERIOPPHARMACY'$ @   Your procedure is scheduled on  08/07/2021.   Report to [facility] at  [place].M.   Call this number if you have problems the morning of surgery:  [phone]   Remember:  Do not eat or drink after midnight.       Use your nebulizer and your inhaler before you come and bring your rescue inhaler with you.    Take these medicines the morning of surgery with A SIP OF WATER                        proscar, metoprolol, flomax.     Do not wear jewelry, make-up or nail polish.  Do not wear lotions, powders, or perfumes, or deodorant.  Do not shave 48 hours prior to surgery.  Men may shave face and neck.  Do not bring valuables to the hospital.  [facility] is not responsible for any belongings or valuables.  Contacts, dentures or bridgework may not be worn into surgery.  Leave your suitcase in the car.  After surgery it may be brought to your room.  For patients admitted to the hospital, discharge time will be determined by your treatment team.  Patients discharged the day of surgery will not be allowed to drive home and must have someone with them for 24 hours.    Special instructions:   DO NOT smoke tobacco or vape for 24 hors before your procedure.  Please read over the following fact sheets that you were given. Coughing and Deep Breathing, Surgical Site Infection Prevention, Anesthesia Post-op Instructions, and Care and Recovery After Surgery      Open Hernia Repair, Adult, Care After What can I expect after the procedure? After the procedure, it is common to have: Mild discomfort. Slight bruising. Mild swelling. Pain in the belly (abdomen). A small amount of blood from the cut from surgery (incision). Follow these instructions at home: Your doctor may give you more specific instructions. If you have problems, call your doctor. Medicines Take over-the-counter and prescription medicines only as told  by your doctor. If told, take steps to prevent problems with pooping (constipation). You may need to: Drink enough fluid to keep your pee (urine) pale yellow. Take medicines. You will be told what medicines to take. Eat foods that are high in fiber. These include beans, whole grains, and fresh fruits and vegetables. Limit foods that are high in fat and sugar. These include fried or sweet foods. Ask your doctor if you should avoid driving or using machines while you are taking your medicine. Incision care  Follow instructions from your doctor about how to take care of your incision. Make sure you: Wash your hands with soap and water for at least 20 seconds before and after you change your bandage (dressing). If you cannot use soap and water, use hand sanitizer. Change your bandage. Leave stitches or skin glue in place for at least 2 weeks. Leave tape strips alone unless you are told to take them off. You may trim the edges of the tape strips if they curl up. Check your incision every day for signs of infection. Check for: More redness, swelling, or pain. More fluid or blood. Warmth. Pus or a bad smell. Wear loose, soft clothing while your incision heals. Activity  Rest as told by your doctor. Do not lift anything that is  heavier than 10 lb (4.5 kg), or the limit that you are told. Do not play contact sports until your doctor says that this is safe. If you were given a sedative during your procedure, do not drive or use machines until your doctor says that it is safe. A sedative is a medicine that helps you relax. Return to your normal activities when your doctor says that it is safe. General instructions Do not take baths, swim, or use a hot tub. Ask your doctor about taking showers or sponge baths. Hold a pillow over your belly when you cough or sneeze. This helps with pain. Do not smoke or use any products that contain nicotine or tobacco. If you need help quitting, ask your  doctor. Keep all follow-up visits. Contact a doctor if: You have any of these signs of infection in or around your incision: More redness, swelling, or pain. More fluid or blood. Warmth. Pus. A bad smell. You have a fever or chills. You have blood in your poop (stool). You have not pooped (had a bowel movement) in 2-3 days. Medicine does not help your pain. Get help right away if: You have chest pain, or you are short of breath. You feel faint or light-headed. You have very bad pain. You vomit and your pain is worse. You have pain, swelling, or redness in a leg. These symptoms may be an emergency. Get help right away. Call your local emergency services (911 in the [place] Do not wait to see if the symptoms will go away. Do not drive yourself to the hospital. Summary After this procedure, it is common to have mild discomfort, slight bruising, and mild swelling. Follow instructions from your doctor about how to take care of your cut from surgery (incision). Check every day for signs of infection. Do not lift heavy objects or play contact sports until your doctor says it is safe. Return to your normal activities as told by your doctor. This information is not intended to replace advice given to you by your health care provider. Make sure you discuss any questions you have with your health care provider. Document Revised: 08/08/2019 Document Reviewed: 08/08/2019 Elsevier Patient Education  [place] Anesthesia, Adult, Care After This sheet gives you information about how to care for yourself after your procedure. Your health care provider may also give you more specific instructions. If you have problems or questions, contact your health care provider. What can I expect after the procedure? After the procedure, the following side effects are common: Pain or discomfort at the IV site. Nausea. Vomiting. Sore throat. Trouble concentrating. Feeling cold or  chills. Feeling weak or tired. Sleepiness and fatigue. Soreness and body aches. These side effects can affect parts of the body that were not involved in surgery. Follow these instructions at home: For the time period you were told by your health care provider:  Rest. Do not participate in activities where you could fall or become injured. Do not drive or use machinery. Do not drink alcohol. Do not take sleeping pills or medicines that cause drowsiness. Do not make important decisions or sign legal documents. Do not take care of children on your own. Eating and drinking Follow any instructions from your health care provider about eating or drinking restrictions. When you feel hungry, start by eating small amounts of foods that are soft and easy to digest (bland), such as toast. Gradually return to your regular diet. Drink enough fluid to keep your urine pale  yellow. If you vomit, rehydrate by drinking water, juice, or clear broth. General instructions If you have sleep apnea, surgery and certain medicines can increase your risk for breathing problems. Follow instructions from your health care provider about wearing your sleep device: Anytime you are sleeping, including during daytime naps. While taking prescription pain medicines, sleeping medicines, or medicines that make you drowsy. Have a responsible adult stay with you for the time you are told. It is important to have someone help care for you until you are awake and alert. Return to your normal activities as told by your health care provider. Ask your health care provider what activities are safe for you. Take over-the-counter and prescription medicines only as told by your health care provider. If you smoke, do not smoke without supervision. Keep all follow-up visits as told by your health care provider. This is important. Contact a health care provider if: You have nausea or vomiting that does not get better with medicine. You  cannot eat or drink without vomiting. You have pain that does not get better with medicine. You are unable to pass urine. You develop a skin rash. You have a fever. You have redness around your IV site that gets worse. Get help right away if: You have difficulty breathing. You have chest pain. You have blood in your urine or stool, or you vomit blood. Summary After the procedure, it is common to have a sore throat or nausea. It is also common to feel tired. Have a responsible adult stay with you for the time you are told. It is important to have someone help care for you until you are awake and alert. When you feel hungry, start by eating small amounts of foods that are soft and easy to digest (bland), such as toast. Gradually return to your regular diet. Drink enough fluid to keep your urine pale yellow. Return to your normal activities as told by your health care provider. Ask your health care provider what activities are safe for you. This information is not intended to replace advice given to you by your health care provider. Make sure you discuss any questions you have with your health care provider. Document Revised: 09/08/2019 Document Reviewed: 04/07/2019 Elsevier Patient Education  [place]. How to Use Chlorhexidine for Bathing Chlorhexidine gluconate (CHG) is a germ-killing (antiseptic) solution that is used to clean the skin. It can get rid of the bacteria that normally live on the skin and can keep them away for about 24 hours. To clean your skin with CHG, you may be given: A CHG solution to use in the shower or as part of a sponge bath. A prepackaged cloth that contains CHG. Cleaning your skin with CHG may help lower the risk for infection: While you are staying in the intensive care unit of the hospital. If you have a vascular access, such as a central line, to provide short-term or long-term access to your veins. If you have a catheter to drain urine from your  bladder. If you are on a ventilator. A ventilator is a machine that helps you breathe by moving air in and out of your lungs. After surgery. What are the risks? Risks of using CHG include: A skin reaction. Hearing loss, if CHG gets in your ears and you have a perforated eardrum. Eye injury, if CHG gets in your eyes and is not rinsed out. The CHG product catching fire. Make sure that you avoid smoking and flames after applying CHG to  your skin. Do not use CHG: If you have a chlorhexidine allergy or have previously reacted to chlorhexidine. On babies younger than 60 months of age. How to use CHG solution Use CHG only as told by your health care provider, and follow the instructions on the label. Use the full amount of CHG as directed. Usually, this is one bottle. During a shower Follow these steps when using CHG solution during a shower (unless your health care provider gives you different instructions): Start the shower. Use your normal soap and shampoo to wash your face and hair. Turn off the shower or move out of the shower stream. Pour the CHG onto a clean washcloth. Do not use any type of brush or rough-edged sponge. Starting at your neck, lather your body down to your toes. Make sure you follow these instructions: If you will be having surgery, pay special attention to the part of your body where you will be having surgery. Scrub this area for at least 1 minute. Do not use CHG on your head or face. If the solution gets into your ears or eyes, rinse them well with water. Avoid your genital area. Avoid any areas of skin that have broken skin, cuts, or scrapes. Scrub your back and under your arms. Make sure to wash skin folds. Let the lather sit on your skin for 1-2 minutes or as long as told by your health care provider. Thoroughly rinse your entire body in the shower. Make sure that all body creases and crevices are rinsed well. Dry off with a clean towel. Do not put any substances on  your body afterward--such as powder, lotion, or perfume--unless you are told to do so by your health care provider. Only use lotions that are recommended by the manufacturer. Put on clean clothes or pajamas. If it is the night before your surgery, sleep in clean sheets.  During a sponge bath Follow these steps when using CHG solution during a sponge bath (unless your health care provider gives you different instructions): Use your normal soap and shampoo to wash your face and hair. Pour the CHG onto a clean washcloth. Starting at your neck, lather your body down to your toes. Make sure you follow these instructions: If you will be having surgery, pay special attention to the part of your body where you will be having surgery. Scrub this area for at least 1 minute. Do not use CHG on your head or face. If the solution gets into your ears or eyes, rinse them well with water. Avoid your genital area. Avoid any areas of skin that have broken skin, cuts, or scrapes. Scrub your back and under your arms. Make sure to wash skin folds. Let the lather sit on your skin for 1-2 minutes or as long as told by your health care provider. Using a different clean, wet washcloth, thoroughly rinse your entire body. Make sure that all body creases and crevices are rinsed well. Dry off with a clean towel. Do not put any substances on your body afterward--such as powder, lotion, or perfume--unless you are told to do so by your health care provider. Only use lotions that are recommended by the manufacturer. Put on clean clothes or pajamas. If it is the night before your surgery, sleep in clean sheets. How to use CHG prepackaged cloths Only use CHG cloths as told by your health care provider, and follow the instructions on the label. Use the CHG cloth on clean, dry skin. Do not use the  CHG cloth on your head or face unless your health care provider tells you to. When washing with the CHG cloth: Avoid your genital  area. Avoid any areas of skin that have broken skin, cuts, or scrapes. Before surgery Follow these steps when using a CHG cloth to clean before surgery (unless your health care provider gives you different instructions): Using the CHG cloth, vigorously scrub the part of your body where you will be having surgery. Scrub using a back-and-forth motion for 3 minutes. The area on your body should be completely wet with CHG when you are done scrubbing. Do not rinse. Discard the cloth and let the area air-dry. Do not put any substances on the area afterward, such as powder, lotion, or perfume. Put on clean clothes or pajamas. If it is the night before your surgery, sleep in clean sheets.  For general bathing Follow these steps when using CHG cloths for general bathing (unless your health care provider gives you different instructions). Use a separate CHG cloth for each area of your body. Make sure you wash between any folds of skin and between your fingers and toes. Wash your body in the following order, switching to a new cloth after each step: The front of your neck, shoulders, and chest. Both of your arms, under your arms, and your hands. Your stomach and groin area, avoiding the genitals. Your right leg and foot. Your left leg and foot. The back of your neck, your back, and your buttocks. Do not rinse. Discard the cloth and let the area air-dry. Do not put any substances on your body afterward--such as powder, lotion, or perfume--unless you are told to do so by your health care provider. Only use lotions that are recommended by the manufacturer. Put on clean clothes or pajamas. Contact a health care provider if: Your skin gets irritated after scrubbing. You have questions about using your solution or cloth. You swallow any chlorhexidine. Call your local poison control center (1-212-084-5888 in the [place] Get help right away if: Your eyes itch badly, or they become very red or swollen. Your  skin itches badly and is red or swollen. Your hearing changes. You have trouble seeing. You have swelling or tingling in your mouth or throat. You have trouble breathing. These symptoms may represent a serious problem that is an emergency. Do not wait to see if the symptoms will go away. Get medical help right away. Call your local emergency services (911 in the [place] Do not drive yourself to the hospital. Summary Chlorhexidine gluconate (CHG) is a germ-killing (antiseptic) solution that is used to clean the skin. Cleaning your skin with CHG may help to lower your risk for infection. You may be given CHG to use for bathing. It may be in a bottle or in a prepackaged cloth to use on your skin. Carefully follow your health care provider's instructions and the instructions on the product label. Do not use CHG if you have a chlorhexidine allergy. Contact your health care provider if your skin gets irritated after scrubbing. This information is not intended to replace advice given to you by your health care provider. Make sure you discuss any questions you have with your health care provider. Document Revised: 03/05/2020 Document Reviewed: 03/05/2020 Elsevier Patient Education  [place].

## 2021-08-05 ENCOUNTER — Other Ambulatory Visit: Payer: Self-pay

## 2021-08-05 ENCOUNTER — Encounter (HOSPITAL_COMMUNITY): Payer: Self-pay

## 2021-08-05 ENCOUNTER — Encounter (HOSPITAL_COMMUNITY)
Admission: RE | Admit: 2021-08-05 | Discharge: 2021-08-05 | Disposition: A | Discharge status: Home / Self Care | Payer: BC Managed Care – PPO | Source: Ambulatory Visit | Attending: General Surgery | Admitting: General Surgery

## 2021-08-05 DIAGNOSIS — K449 Diaphragmatic hernia without obstruction or gangrene: Secondary | ICD-10-CM | POA: Diagnosis not present

## 2021-08-05 DIAGNOSIS — Z01810 Encounter for preprocedural cardiovascular examination: Secondary | ICD-10-CM | POA: Insufficient documentation

## 2021-08-05 DIAGNOSIS — K219 Gastro-esophageal reflux disease without esophagitis: Secondary | ICD-10-CM | POA: Diagnosis not present

## 2021-08-05 DIAGNOSIS — I10 Essential (primary) hypertension: Secondary | ICD-10-CM | POA: Diagnosis not present

## 2021-08-05 DIAGNOSIS — K4090 Unilateral inguinal hernia, without obstruction or gangrene, not specified as recurrent: Secondary | ICD-10-CM | POA: Diagnosis not present

## 2021-08-05 DIAGNOSIS — Z87891 Personal history of nicotine dependence: Secondary | ICD-10-CM | POA: Diagnosis not present

## 2021-08-05 DIAGNOSIS — E119 Type 2 diabetes mellitus without complications: Secondary | ICD-10-CM | POA: Diagnosis not present

## 2021-08-05 HISTORY — DX: Cardiac murmur, unspecified: R01.1

## 2021-08-05 HISTORY — DX: Sleep apnea, unspecified: G47.30

## 2021-08-05 HISTORY — DX: Cardiac arrhythmia, unspecified: I49.9

## 2021-08-07 ENCOUNTER — Encounter (HOSPITAL_COMMUNITY)
Admission: RE | Disposition: A | Discharge status: Home / Self Care | Payer: Self-pay | Source: Home / Self Care | Attending: General Surgery

## 2021-08-07 ENCOUNTER — Other Ambulatory Visit: Payer: Self-pay

## 2021-08-07 ENCOUNTER — Ambulatory Visit (HOSPITAL_COMMUNITY): Payer: BC Managed Care – PPO

## 2021-08-07 ENCOUNTER — Ambulatory Visit (HOSPITAL_COMMUNITY)
Admission: RE | Admit: 2021-08-07 | Discharge: 2021-08-07 | Disposition: A | Discharge status: Home / Self Care | Payer: BC Managed Care – PPO | Attending: General Surgery | Admitting: General Surgery

## 2021-08-07 ENCOUNTER — Encounter (HOSPITAL_COMMUNITY): Payer: Self-pay | Admitting: General Surgery

## 2021-08-07 DIAGNOSIS — K4090 Unilateral inguinal hernia, without obstruction or gangrene, not specified as recurrent: Secondary | ICD-10-CM | POA: Diagnosis not present

## 2021-08-07 DIAGNOSIS — K4020 Bilateral inguinal hernia, without obstruction or gangrene, not specified as recurrent: Secondary | ICD-10-CM | POA: Diagnosis present

## 2021-08-07 DIAGNOSIS — E119 Type 2 diabetes mellitus without complications: Secondary | ICD-10-CM | POA: Insufficient documentation

## 2021-08-07 DIAGNOSIS — K449 Diaphragmatic hernia without obstruction or gangrene: Secondary | ICD-10-CM | POA: Diagnosis not present

## 2021-08-07 DIAGNOSIS — I10 Essential (primary) hypertension: Secondary | ICD-10-CM | POA: Diagnosis not present

## 2021-08-07 DIAGNOSIS — K4091 Unilateral inguinal hernia, without obstruction or gangrene, recurrent: Secondary | ICD-10-CM

## 2021-08-07 DIAGNOSIS — Z87891 Personal history of nicotine dependence: Secondary | ICD-10-CM | POA: Diagnosis not present

## 2021-08-07 DIAGNOSIS — K219 Gastro-esophageal reflux disease without esophagitis: Secondary | ICD-10-CM | POA: Insufficient documentation

## 2021-08-07 HISTORY — PX: INGUINAL HERNIA REPAIR: SHX194

## 2021-08-07 LAB — GLUCOSE, CAPILLARY
Glucose-Capillary: 76 mg/dL (ref 70–99)
Glucose-Capillary: 78 mg/dL (ref 70–99)

## 2021-08-07 SURGERY — REPAIR, HERNIA, INGUINAL, ADULT
Anesthesia: General | Site: Inguinal | Laterality: Right

## 2021-08-07 MED ORDER — ONDANSETRON HCL 4 MG/2ML IJ SOLN
INTRAMUSCULAR | Status: DC | PRN
  Administered 2021-08-07: 4 mg via INTRAVENOUS

## 2021-08-07 MED ORDER — PROPOFOL 10 MG/ML IV BOLUS
INTRAVENOUS | Status: DC | PRN
  Administered 2021-08-07: 200 mg via INTRAVENOUS

## 2021-08-07 MED ORDER — CEFAZOLIN SODIUM-DEXTROSE 2-4 GM/100ML-% IV SOLN
2.0000 g | INTRAVENOUS | Status: AC
  Administered 2021-08-07: 2 g via INTRAVENOUS
  Filled 2021-08-07: qty 100

## 2021-08-07 MED ORDER — EPHEDRINE SULFATE (PRESSORS) 50 MG/ML IJ SOLN
INTRAMUSCULAR | Status: DC | PRN
  Administered 2021-08-07 (×5): 5 mg via INTRAVENOUS

## 2021-08-07 MED ORDER — BUPIVACAINE HCL (300 MG DOSE) 3 X 100 MG IL IMPL
DRUG_IMPLANT | Status: DC | PRN
  Administered 2021-08-07: 300 mg

## 2021-08-07 MED ORDER — FUROSEMIDE 10 MG/ML IJ SOLN
20.0000 mg | Freq: Once | INTRAMUSCULAR | Status: AC
  Administered 2021-08-07: 20 mg via INTRAVENOUS

## 2021-08-07 MED ORDER — ONDANSETRON HCL 4 MG/2ML IJ SOLN
INTRAMUSCULAR | Status: AC
  Filled 2021-08-07: qty 2

## 2021-08-07 MED ORDER — FUROSEMIDE 10 MG/ML IJ SOLN
INTRAMUSCULAR | Status: AC
  Filled 2021-08-07: qty 4

## 2021-08-07 MED ORDER — LIDOCAINE HCL (PF) 2 % IJ SOLN
INTRAMUSCULAR | Status: AC
  Filled 2021-08-07: qty 5

## 2021-08-07 MED ORDER — OXYCODONE HCL 5 MG PO TABS: 5.0000 mg | ORAL_TABLET | ORAL | 0 refills | Status: DC | PRN

## 2021-08-07 MED ORDER — LACTATED RINGERS IV SOLN: INTRAVENOUS | Status: DC | PRN

## 2021-08-07 MED ORDER — DEXAMETHASONE SODIUM PHOSPHATE 10 MG/ML IJ SOLN
INTRAMUSCULAR | Status: DC | PRN
  Administered 2021-08-07: 4 mg via INTRAVENOUS

## 2021-08-07 MED ORDER — DEXAMETHASONE SODIUM PHOSPHATE 10 MG/ML IJ SOLN
INTRAMUSCULAR | Status: AC
  Filled 2021-08-07: qty 1

## 2021-08-07 MED ORDER — ONDANSETRON HCL 4 MG/2ML IJ SOLN: 4.0000 mg | Freq: Once | INTRAMUSCULAR | Status: DC | PRN

## 2021-08-07 MED ORDER — BUPIVACAINE HCL (300 MG DOSE) 3 X 100 MG IL IMPL
DRUG_IMPLANT | Status: AC
  Filled 2021-08-07: qty 100

## 2021-08-07 MED ORDER — MIDAZOLAM HCL 2 MG/2ML IJ SOLN
INTRAMUSCULAR | Status: AC
  Filled 2021-08-07: qty 2

## 2021-08-07 MED ORDER — ONDANSETRON HCL 4 MG PO TABS: 4.0000 mg | ORAL_TABLET | Freq: Three times a day (TID) | ORAL | 1 refills | Status: DC | PRN

## 2021-08-07 MED ORDER — CHLORHEXIDINE GLUCONATE CLOTH 2 % EX PADS: 6.0000 | MEDICATED_PAD | Freq: Once | CUTANEOUS | Status: DC

## 2021-08-07 MED ORDER — MIDAZOLAM HCL 5 MG/5ML IJ SOLN
INTRAMUSCULAR | Status: DC | PRN
  Administered 2021-08-07: 2 mg via INTRAVENOUS

## 2021-08-07 MED ORDER — SODIUM CHLORIDE 0.9 % IR SOLN
Status: DC | PRN
  Administered 2021-08-07: 1000 mL

## 2021-08-07 MED ORDER — LIDOCAINE HCL (CARDIAC) PF 100 MG/5ML IV SOSY
PREFILLED_SYRINGE | INTRAVENOUS | Status: DC | PRN
  Administered 2021-08-07: 100 mg via INTRAVENOUS

## 2021-08-07 MED ORDER — PROPOFOL 10 MG/ML IV BOLUS
INTRAVENOUS | Status: AC
  Filled 2021-08-07: qty 20

## 2021-08-07 MED ORDER — FENTANYL CITRATE (PF) 250 MCG/5ML IJ SOLN
INTRAMUSCULAR | Status: AC
  Filled 2021-08-07: qty 5

## 2021-08-07 MED ORDER — FENTANYL CITRATE (PF) 100 MCG/2ML IJ SOLN
INTRAMUSCULAR | Status: DC | PRN
  Administered 2021-08-07: 25 ug via INTRAVENOUS

## 2021-08-07 MED ORDER — FENTANYL CITRATE PF 50 MCG/ML IJ SOSY
25.0000 ug | PREFILLED_SYRINGE | INTRAMUSCULAR | Status: DC | PRN
  Administered 2021-08-07: 50 ug via INTRAVENOUS
  Filled 2021-08-07: qty 1

## 2021-08-07 SURGICAL SUPPLY — 32 items
ADH SKN CLS APL DERMABOND .7 (GAUZE/BANDAGES/DRESSINGS) ×1
CLOTH BEACON ORANGE TIMEOUT ST (SAFETY) ×2 IMPLANT
COVER LIGHT HANDLE STERIS (MISCELLANEOUS) ×4 IMPLANT
DERMABOND ADVANCED (GAUZE/BANDAGES/DRESSINGS) ×1
DERMABOND ADVANCED .7 DNX12 (GAUZE/BANDAGES/DRESSINGS) ×1 IMPLANT
DRAIN PENROSE 0.5X18 (DRAIN) ×2 IMPLANT
ELECT REM PT RETURN 9FT ADLT (ELECTROSURGICAL) ×2
ELECTRODE REM PT RTRN 9FT ADLT (ELECTROSURGICAL) ×1 IMPLANT
GAUZE SPONGE 4X4 12PLY STRL (GAUZE/BANDAGES/DRESSINGS) ×2 IMPLANT
GLOVE BIO SURGEON STRL SZ 6.5 (GLOVE) ×3 IMPLANT
GLOVE BIOGEL PI IND STRL 6.5 (GLOVE) ×1 IMPLANT
GLOVE BIOGEL PI IND STRL 7.0 (GLOVE) ×2 IMPLANT
GLOVE BIOGEL PI INDICATOR 6.5 (GLOVE) ×1
GLOVE BIOGEL PI INDICATOR 7.0 (GLOVE) ×2
GOWN STRL REUS W/TWL LRG LVL3 (GOWN DISPOSABLE) ×6 IMPLANT
INST SET MINOR GENERAL (KITS) ×2 IMPLANT
KIT TURNOVER KIT A (KITS) ×2 IMPLANT
MANIFOLD NEPTUNE II (INSTRUMENTS) ×2 IMPLANT
MESH MARLEX PLUG MEDIUM (Mesh General) ×1 IMPLANT
NS IRRIG 1000ML POUR BTL (IV SOLUTION) ×2 IMPLANT
PACK MINOR (CUSTOM PROCEDURE TRAY) ×2 IMPLANT
PAD ARMBOARD 7.5X6 YLW CONV (MISCELLANEOUS) ×2 IMPLANT
SET BASIN LINEN APH (SET/KITS/TRAYS/PACK) ×2 IMPLANT
SOL PREP PROV IODINE SCRUB 4OZ (MISCELLANEOUS) ×2 IMPLANT
SUT MNCRL AB 4-0 PS2 18 (SUTURE) ×2 IMPLANT
SUT NOVA NAB GS-22 2 2-0 T-19 (SUTURE) ×3 IMPLANT
SUT VIC AB 2-0 CT1 27 (SUTURE) ×2
SUT VIC AB 2-0 CT1 TAPERPNT 27 (SUTURE) ×1 IMPLANT
SUT VIC AB 3-0 SH 27 (SUTURE) ×2
SUT VIC AB 3-0 SH 27X BRD (SUTURE) ×1 IMPLANT
SUT VICRYL AB 3 0 TIES (SUTURE) ×1 IMPLANT
SYR 30ML LL (SYRINGE) ×2 IMPLANT

## 2021-08-07 NOTE — Progress Notes (Signed)
[facility]  Updated wife. Will need to urinate before leaving. Ice the area. Will see on 09/04/21.  Rx sent to Pharmacy.  Curlene Labrum, MD [facility] [address] [place], [place] [address] [phone] (office)

## 2021-08-07 NOTE — Anesthesia Preprocedure Evaluation (Signed)
Anesthesia Evaluation  Patient identified by MRN, date of birth, ID band Patient awake    Reviewed: Allergy & Precautions, H&P , NPO status , Patient's Chart, lab work & pertinent test results, reviewed documented beta blocker date and time   History of Anesthesia Complications (+) PONV and history of anesthetic complications  Airway Mallampati: II  TM Distance: >3 FB Neck ROM: full    Dental no notable dental hx.    Pulmonary asthma , sleep apnea , former smoker,    Pulmonary exam normal breath sounds clear to auscultation       Cardiovascular Exercise Tolerance: Good hypertension, negative cardio ROS   Rhythm:regular Rate:Normal     Neuro/Psych  Neuromuscular disease negative psych ROS   GI/Hepatic Neg liver ROS, hiatal hernia, GERD  Medicated,  Endo/Other  negative endocrine ROSdiabetes, Type 2  Renal/GU negative Renal ROS  negative genitourinary   Musculoskeletal   Abdominal   Peds  Hematology  (+) Blood dyscrasia, anemia ,   Anesthesia Other Findings   Reproductive/Obstetrics negative OB ROS                             Anesthesia Physical Anesthesia Plan  ASA: 3  Anesthesia Plan: General and General LMA   Post-op Pain Management:    Induction:   PONV Risk Score and Plan: Ondansetron  Airway Management Planned:   Additional Equipment:   Intra-op Plan:   Post-operative Plan:   Informed Consent: I have reviewed the patients History and Physical, chart, labs and discussed the procedure including the risks, benefits and alternatives for the proposed anesthesia with the patient or authorized representative who has indicated his/her understanding and acceptance.     Dental Advisory Given  Plan Discussed with: CRNA  Anesthesia Plan Comments:         Anesthesia Quick Evaluation

## 2021-08-07 NOTE — Anesthesia Procedure Notes (Signed)
Procedure Name: LMA Insertion Date/Time: 08/07/2021 9:35 AM  Performed by: Louann Sjogren, MDPre-anesthesia Checklist: Patient identified, Emergency Drugs available, Suction available and Patient being monitored Patient Re-evaluated:Patient Re-evaluated prior to induction Oxygen Delivery Method: Circle system utilized Preoxygenation: Pre-oxygenation with 100% oxygen Induction Type: IV induction Ventilation: Mask ventilation without difficulty LMA: LMA inserted LMA Size: 4.0 Number of attempts: 1 Placement Confirmation: positive ETCO2 Tube secured with: Tape Dental Injury: Teeth and Oropharynx as per pre-operative assessment

## 2021-08-07 NOTE — Interval H&P Note (Signed)
History and Physical Interval Note:  08/07/2021 8:51 AM  Johnny Mathews  has presented today for surgery, with the diagnosis of RIGHT INGUINAL HERNIA.  The various methods of treatment have been discussed with the patient and family. After consideration of risks, benefits and other options for treatment, the patient has consented to  Procedure(s): HERNIA REPAIR INGUINAL ADULT W/ MESH (Right) as a surgical intervention.  The patient's history has been reviewed, patient examined, no change in status, stable for surgery.  I have reviewed the patient's chart and labs.  Questions were answered to the patient's satisfaction.    Marked.   Virl Cagey

## 2021-08-07 NOTE — Discharge Instructions (Signed)
Discharge Instructions Hernia:  Common Complaints: Pain at the incision site is common. This will improve with time. Take your pain medications as described below. Some nausea is common and poor appetite. The main goal is to stay hydrated the first few days after surgery.  Numbness at the incision or the thigh is common.  If you start to have burning or tingling pain in your groin, this is from a nerve being pinched. Please call and we can prescribe you a different type of pain medication for nerve pain.  Ice the area for the first 72 hours. Place a rolled up towel under the scrotum to help with swelling.   Diet/ Activity: Diet as tolerated. You may not have an appetite, but it is important to stay hydrated. Drink 64 ounces of water a day. Your appetite will return with time.  Shower per your regular routine daily.  Do not take hot showers. Take warm showers that are less than 10 minutes. Rest and listen to your body, but do not remain in bed all day.  Walk everyday for at least 15-20 minutes. Deep cough and move around every 1-2 hours in the first few days after surgery.  Do not pick at the dermabond glue on your incision sites.  This glue film will remain in place for 1-2 weeks and will start to peel off.  Do not place lotions or balms on your incision unless instructed to specifically by Dr. Constance Haw.  Do not lift > 10 lbs, perform excessive bending, pushing, pulling, squatting for 6-8 weeks after surgery.   Pain Expectations and Narcotics: -After surgery you will have pain associated with your incisions and this is normal. The pain is muscular and nerve pain, and will get better with time. -You are encouraged and expected to take non narcotic medications like tylenol and ibuprofen (when able) to treat pain as multiple modalities can aid with pain treatment. -Narcotics are only used when pain is severe or there is breakthrough pain. -You are not expected to have a pain score of 0 after  surgery, as we cannot prevent pain. A pain score of 3-4 that allows you to be functional, move, walk, and tolerate some activity is the goal. The pain will continue to improve over the days after surgery and is dependent on your surgery. -Due to [place] law, we are only able to give a certain amount of pain medication to treat post operative pain, and we only give additional narcotics on a patient by patient basis.  -For most laparoscopic surgery, studies have shown that the majority of patients only need 10-15 narcotic pills, and for open surgeries most patients only need 15-20.   -Having appropriate expectations of pain and knowledge of pain management with non narcotics is important as we do not want anyone to become addicted to narcotic pain medication.  -Using ice packs in the first 48 hours and heating pads after 48 hours, wearing an abdominal binder (when recommended), and using over the counter medications are all ways to help with pain management.   -Simple acts like meditation and mindfulness practices after surgery can also help with pain control and research has proven the benefit of these practices.  Medication: Take tylenol and ibuprofen as needed for pain control, alternating every 4-6 hours.  Example:  Tylenol '1000mg'$  @ 6am, 12noon, 6pm, 4mdnight (Do not exceed '4000mg'$  of tylenol a day). Ibuprofen '800mg'$  @ 9am, 3pm, 9pm, 3am (Do not exceed '3600mg'$  of ibuprofen a day).  Take Roxicodone for  breakthrough pain every 4 hours.  Take Colace for constipation related to narcotic pain medication. If you do not have a bowel movement in 2 days, take Miralax over the counter.  Drink plenty of water to also prevent constipation.   Contact Information: If you have questions or concerns, please call our office, [phone], Monday- Thursday 8AM-5PM and Friday 8AM-12Noon.  If it is after hours or on the weekend, please call [facility], [phone], [phone], and ask to speak to the surgeon on  call for Dr. Constance Haw at [facility].

## 2021-08-07 NOTE — Op Note (Signed)
[facility] Operative Note  08/07/21  Preoperative Diagnosis: Right inguinal hernia    Postoperative Diagnosis: Recurrent right inguina hernia    Procedure(s) Performed: Right inguinal hernia repair with mesh   Surgeon: Lanell Matar. Constance Haw, MD   Assistants: No qualified resident was available   Anesthesia: General endotracheal   Anesthesiologist: Dr. Briant Cedar, MD    Specimens:  None    Estimated Blood Loss: Minimal   Blood Replacement: None    Complications: None    Wound Class: Clean    Operative Indications: Mr. Johnny Mathews is a 61 yo with a right inguinal hernia that is causing him issues. He wants to proceed with repair. We discussed risk of bleeding, infection, recurrence, use of mesh, injury to nerve or cord structures.   Findings: Prior scar superior to my incision, evidence of prior repair with scarring of the tissue, no obvious scarpa's or camper's fascia and scarring of the external oblique tissue, no prior mesh noted    Procedure: The patient was taken to the operating room and placed supine. General endotracheal anesthesia was induced. Intravenous antibiotics were administered per protocol.  A time out was preformed verifying the correct patient, procedure, site, positioning and implants.  The right groin and scrotum were prepared and draped in the usual sterile fashion.   An incision was made in a natural skin crease between the pubic tubercle and the anterior superior iliac spine.  There was evidence of a prior scar superior to my incision. The incision was deepened with electrocautery through scarred tissue but no obvious Scarpa's and Camper's fascia until the aponeurosis of the external oblique was encountered. The external oblique looked scarred.  This was cleaned and the external ring was exposed.  An incision was made in the midportion of the external oblique aponeurosis in the direction of its fibers. The ilioinguinal nerve was not identified and had  potentially been transected in the prior surgery given the likely timing as a young child or adolescent.  and was protected throughout the dissection.  Flaps of the external oblique were developed cephalad and inferiorly.    The cord was identified and it was gently dissented free at the pubic tubercle and encircled with a Penrose drain.  Attention was then directed at the anteromedial aspect of the cord, where an indirect hernia sac was identified.  The sac was carefully dissected free from the cord down to the level of the internal ring.  The vas and testicular vessels were identified and protected from harm.  A cord lipoma was ligated and resected. Once the sac was dissected free from the cords, the Penrose was placed around the cord which was retracted inferiorly out of the field of view.  The hernia was reduced into the internal ring without difficulty.  A medium Perfix Plug was placed into the defect and filled the space.  Attention was then turned to the floor of the canal, which was grossly weakened without any defined defect or sac.  The Perfix Mesh Patch was sutured to the inguinal ligament inferiorly starting at the pubic tubercle using 2-0 Novafil interrupted sutures.  The mesh was sutured superiorly to the conjoint tendon using 2-0 Novafil interrupted sutures.  Care was taken to ensure the mesh was placed in a relaxed fashion to avoid excessive tension and no neurovascular structures were caught in the repair.  Laterally the tails of the mesh were crossed and the internal ring was recreated, allowing for passage of cords without tension.   Hemostasis was  adequate.  The Penrose was removed. Robynn Pane was layered in the repair.  The external oblique aponeurosis was closed with a 2-0 Vicryl suture in a running fashion, taking care to not catch the ilioinguinal nerve in the suture line.  Scarpa's fashion was closed with 3-0 Vicryl interrupted sutures. The skin was closed with a subcuticular 4-0 Monocryl  suture.  Dermabond was applied.   The testis was gently pulled down into its anatomic position in the scrotum.  The patient tolerated the procedure well and was taken to the PACU in stable condition. All counts were correct at the end of the case.        Curlene Labrum, MD [facility] [address] [place], [place] [address] [phone] (office)

## 2021-08-07 NOTE — Transfer of Care (Signed)
Immediate Anesthesia Transfer of Care Note  Patient: Johnny Mathews  Procedure(s) Performed: HERNIA REPAIR INGUINAL ADULT WITH MESH (Right: Inguinal)  Patient Location: PACU  Anesthesia Type:General  Level of Consciousness: drowsy and responds to stimulation  Airway & Oxygen Therapy: Patient Spontanous Breathing  Post-op Assessment: Report given to RN and Post -op Vital signs reviewed and stable  Post vital signs: Reviewed and stable  Last Vitals:  Vitals Value Taken Time  BP 95/60 08/07/21 1047  Temp 36.8 C 08/07/21 1047  Pulse 66 08/07/21 1050  Resp 14 08/07/21 1050  SpO2 100 % 08/07/21 1050  Vitals shown include unvalidated device data.  Last Pain:  Vitals:   08/07/21 0752  TempSrc: Oral  PainSc: 0-No pain         Complications: No notable events documented.

## 2021-08-08 ENCOUNTER — Encounter (HOSPITAL_COMMUNITY): Payer: Self-pay | Admitting: General Surgery

## 2021-08-08 ENCOUNTER — Telehealth: Payer: Self-pay | Admitting: *Deleted

## 2021-08-08 NOTE — Telephone Encounter (Signed)
Received call from patient [phone]~ telephone.   Surgical Date: 08/08/2021 Procedure: Inguinal Hernia Repair W/ Mesh, Right  Patient reports increased pain in lower abdominal area and inguinal repair area. States that he is taking prescription medication as ordered, but is only getting 1 hr of relief. States that he is using APAP as directed and applying ice.   Advised to incorporate IBU and to take APAP/IBU scheduled around the clock. Patient states that he will take OTC analgesics as directed. Advised to try to ambulate as well to assist with pain.   Verbalized understanding. States that he will call back if pain is unmanageable.

## 2021-08-09 NOTE — Anesthesia Postprocedure Evaluation (Signed)
Anesthesia Post Note  Patient: Johnny Mathews  Procedure(s) Performed: HERNIA REPAIR INGUINAL ADULT WITH MESH (Right: Inguinal)  Patient location during evaluation: Phase II Anesthesia Type: General Level of consciousness: awake Pain management: pain level controlled Vital Signs Assessment: post-procedure vital signs reviewed and stable Respiratory status: spontaneous breathing and respiratory function stable Cardiovascular status: blood pressure returned to baseline and stable Postop Assessment: no headache and no apparent nausea or vomiting Anesthetic complications: no Comments: Late entry   No notable events documented.   Last Vitals:  Vitals:   08/07/21 1200 08/07/21 1227  BP: 126/79 (!) 144/95  Pulse: 66 62  Resp: 20 20  Temp:    SpO2: 99% 95%    Last Pain:  Vitals:   08/08/21 1445  TempSrc:   PainSc: [place]

## 2021-08-15 ENCOUNTER — Telehealth: Payer: Self-pay | Admitting: *Deleted

## 2021-08-15 NOTE — Telephone Encounter (Signed)
Received call from patient [phone]~ telephone.   Surgical Date: 08/07/2021 Procedure: Inguinal Hernia Repair w/ Mesh, Right  Patient reports that he has abnormal nerve sensation at incision site of right side that has been noted since surgery. States that sensation feels similar to vibration from cell phone, but is constant.   Discussed with Dr. Arnoldo Morale, on call provider for Dr.Bridges. advised that abnormal nerve sensations are common after surgery and incision through skin and nerve endings.   Advised to continue OTC pain management. Advised to notify office if sensation worsen or or does not improve >1 week.   Call placed to patient and patient made aware. Verbalized understanding.

## 2021-08-19 ENCOUNTER — Other Ambulatory Visit: Payer: Self-pay | Admitting: Family Medicine

## 2021-08-20 DIAGNOSIS — I5032 Chronic diastolic (congestive) heart failure: Secondary | ICD-10-CM | POA: Diagnosis not present

## 2021-08-20 DIAGNOSIS — Z6830 Body mass index (BMI) 30.0-30.9, adult: Secondary | ICD-10-CM | POA: Diagnosis not present

## 2021-08-20 DIAGNOSIS — I480 Paroxysmal atrial fibrillation: Secondary | ICD-10-CM | POA: Diagnosis not present

## 2021-08-20 DIAGNOSIS — I25118 Atherosclerotic heart disease of native coronary artery with other forms of angina pectoris: Secondary | ICD-10-CM | POA: Diagnosis not present

## 2021-08-23 ENCOUNTER — Ambulatory Visit (INDEPENDENT_AMBULATORY_CARE_PROVIDER_SITE_OTHER): Payer: BC Managed Care – PPO | Admitting: Family Medicine

## 2021-08-23 ENCOUNTER — Encounter: Payer: Self-pay | Admitting: Family Medicine

## 2021-08-23 VITALS — BP 136/75 | HR 70 | Temp 98.3°F | Ht 68.5 in | Wt 204.0 lb

## 2021-08-23 DIAGNOSIS — I10 Essential (primary) hypertension: Secondary | ICD-10-CM

## 2021-08-23 DIAGNOSIS — E119 Type 2 diabetes mellitus without complications: Secondary | ICD-10-CM

## 2021-08-23 DIAGNOSIS — G4709 Other insomnia: Secondary | ICD-10-CM | POA: Diagnosis not present

## 2021-08-23 DIAGNOSIS — N400 Enlarged prostate without lower urinary tract symptoms: Secondary | ICD-10-CM

## 2021-08-23 DIAGNOSIS — E785 Hyperlipidemia, unspecified: Secondary | ICD-10-CM

## 2021-08-23 LAB — BAYER DCA HB A1C WAIVED: HB A1C (BAYER DCA - WAIVED): 5.2 % (ref 4.8–5.6)

## 2021-08-23 MED ORDER — AMITRIPTYLINE HCL 50 MG PO TABS: 50.0000 mg | ORAL_TABLET | Freq: Every day | ORAL | 3 refills | Status: DC

## 2021-08-23 MED ORDER — AZELASTINE HCL 0.1 % NA SOLN: NASAL | 5 refills | Status: DC

## 2021-08-23 MED ORDER — FINASTERIDE 5 MG PO TABS
ORAL_TABLET | ORAL | 3 refills | Status: DC
Start: 2021-08-23 — End: 2022-12-11

## 2021-08-23 NOTE — Progress Notes (Signed)
BP 136/75   Pulse 70   Temp 98.3 F (36.8 C)   Ht 5' 8.5" (1.74 m)   Wt 204 lb (92.5 kg)   SpO2 98%   BMI 30.57 kg/m    Subjective:   Patient ID: Johnny Mathews, male    DOB: 1960-02-21, 61 y.o.   MRN: [id number]  HPI: Johnny Mathews is a 61 y.o. male presenting on 08/23/2021 for Medical Management of Chronic Issues and Diabetes   HPI Type 2 diabetes mellitus Patient comes in today for recheck of his diabetes. Patient has been currently taking Trulicity, K4M is 5.2. Patient is currently on an ACE inhibitor/ARB. Patient has not seen an ophthalmologist this year. Patient denies any issues with their feet. The symptom started onset as an adult hypertension and hyperlipidemia ARE RELATED TO DM   Hypertension Patient is currently on furosemide, losartan, and their blood pressure today is 136/75. Patient denies any lightheadedness or dizziness. Patient denies headaches, blurred vision, chest pains, shortness of breath, or weakness. Denies any side effects from medication and is content with current medication.   Hyperlipidemia Patient is coming in for recheck of his hyperlipidemia. The patient is currently taking pravastatin. They deny any issues with myalgias or history of liver damage from it. They deny any focal numbness or weakness or chest pain.   BPH Patient is coming in for recheck on BPH Symptoms: None currently Medication: Finasteride Last PSA: Over a year ago  Relevant past medical, surgical, family and social history reviewed and updated as indicated. Interim medical history since our last visit reviewed. Allergies and medications reviewed and updated.  Review of Systems  Constitutional:  Negative for chills and fever.  Eyes:  Negative for visual disturbance.  Respiratory:  Negative for shortness of breath and wheezing.   Cardiovascular:  Negative for chest pain and leg swelling.  Skin:  Negative for rash.  Neurological:  Negative for dizziness, weakness and  light-headedness.  All other systems reviewed and are negative.   Per HPI unless specifically indicated above   Allergies as of 08/23/2021       Reactions   Eggs Or Egg-derived Products Swelling   Throat- 03-31-2019 pt states can eat cakes and Pie with no issues  Throat- 03-31-2019 pt states can eat cakes and Pie with no issues    Septra [sulfamethoxazole-trimethoprim] Swelling   throat   Sulfonamide Derivatives Hives        Medication List        Accurate as of August 23, 2021  2:48 PM. If you have any questions, ask your nurse or doctor.          STOP taking these medications    ondansetron 4 MG tablet Commonly known as: Zofran Stopped by: Fransisca Kaufmann Clancey Welton, MD   oxyCODONE 5 MG immediate release tablet Commonly known as: Roxicodone Stopped by: Worthy Rancher, MD       TAKE these medications    albuterol 108 (90 Base) MCG/ACT inhaler Commonly known as: Ventolin HFA Inhale 2 puffs into the lungs every 4 (four) hours as needed. (Needs to be seen before next refill)   albuterol (2.5 MG/3ML) 0.083% nebulizer solution Commonly known as: PROVENTIL NEBULIZE 1 VIAL EVERY 6 HOURS AS NEEDED FOR WHEEZING & SHORTNESS OF BREATH   amitriptyline 50 MG tablet Commonly known as: ELAVIL Take 1 tablet (50 mg total) by mouth at bedtime. What changed: how much to take   azelastine 0.1 % nasal spray Commonly known as: ASTELIN  Use in each nostril as directed What changed: See the new instructions. Changed by: Worthy Rancher, MD   Arnell Sieving [phone] MCG/ACT Aero Generic drug: Budeson-Glycopyrrol-Formoterol Inhale 2 puffs into the lungs in the morning and at bedtime. What changed: when to take this   Contour Blood Glucose System w/Device Kit Test blood sugars four times daily   EPINEPHrine 0.3 mg/0.3 mL Soaj injection Commonly known as: EPI-PEN INJECT 0.3ML (0.3MG) IM ONCE   esomeprazole 40 MG capsule Commonly known as: NEXIUM TAKE 1 CAPSULE DAILY  AT NOON What changed:  how much to take how to take this when to take this additional instructions   famotidine 20 MG tablet Commonly known as: PEPCID Take 1 tablet (20 mg total) by mouth 2 (two) times daily. What changed:  how much to take when to take this   ferrous sulfate 325 (65 FE) MG tablet Take 325 mg by mouth daily with breakfast.   finasteride 5 MG tablet Commonly known as: PROSCAR Take one tablet daily What changed: See the new instructions. Changed by: Fransisca Kaufmann Lakeisha Waldrop, MD   fluticasone 50 MCG/ACT nasal spray Commonly known as: FLONASE USE 1 TO 2 SPRAYS IN EACH NOSTRIL DAILY What changed:  how much to take when to take this   furosemide 20 MG tablet Commonly known as: LASIX Take 1 tablet (20 mg total) by mouth daily as needed. What changed: when to take this   glucose blood test strip Commonly known as: Contour Next Test Test blood sugars four times daily   losartan 50 MG tablet Commonly known as: COZAAR Take 1 tablet (50 mg total) by mouth daily. What changed: when to take this   meloxicam 7.5 MG tablet Commonly known as: MOBIC TAKE ONE (1) TABLET EACH DAY What changed:  how much to take how to take this when to take this additional instructions   metoprolol succinate 50 MG 24 hr tablet Commonly known as: TOPROL-XL Take 50 mg by mouth daily. Take with or immediately following a meal.   montelukast 10 MG tablet Commonly known as: SINGULAIR TAKE ONE TABLET DAILY AT BEDTIME   pravastatin 80 MG tablet Commonly known as: PRAVACHOL Take one tablet daily What changed:  how much to take how to take this when to take this additional instructions   sildenafil 20 MG tablet Commonly known as: REVATIO TAKE 1 TO 3 TABLETS DAILY AS NEEDED   Spacer/Aero-Holding Dorise Bullion Use with inhaler daily   SYRINGE-NEEDLE (DISP) 3 ML 21G X 1-1/2" 3 ML Misc Use to inject testosterone every week   tamsulosin 0.4 MG Caps capsule Commonly known as:  FLOMAX Take one tablet after supper What changed:  how much to take how to take this when to take this additional instructions   testosterone cypionate 100 MG/ML injection Commonly known as: DEPOTESTOTERONE CYPIONATE Inject 0.5 mLs (50 mg total) into the muscle every 7 (seven) days. For IM use only What changed: when to take this   Trulicity 0.73 XT/0.6YI Sopn Generic drug: Dulaglutide Inject 0.75 mg into the skin once a week. What changed: when to take this         Objective:   BP 136/75   Pulse 70   Temp 98.3 F (36.8 C)   Ht 5' 8.5" (1.74 m)   Wt 204 lb (92.5 kg)   SpO2 98%   BMI 30.57 kg/m   Wt Readings from Last 3 Encounters:  08/23/21 204 lb (92.5 kg)  08/05/21 200 lb 3.2 oz (90.8  kg)  07/22/21 200 lb 3.2 oz (90.8 kg)    Physical Exam Vitals and nursing note reviewed.  Constitutional:      General: He is not in acute distress.    Appearance: He is well-developed. He is not diaphoretic.  Eyes:     General: No scleral icterus.    Conjunctiva/sclera: Conjunctivae normal.  Neck:     Thyroid: No thyromegaly.  Cardiovascular:     Rate and Rhythm: Normal rate and regular rhythm.     Heart sounds: Normal heart sounds. No murmur heard. Pulmonary:     Effort: Pulmonary effort is normal. No respiratory distress.     Breath sounds: Normal breath sounds. No wheezing.  Musculoskeletal:        General: No swelling. Normal range of motion.     Cervical back: Neck supple.  Lymphadenopathy:     Cervical: No cervical adenopathy.  Skin:    General: Skin is warm and dry.     Findings: No rash.  Neurological:     Mental Status: He is alert and oriented to person, place, and time.     Coordination: Coordination normal.  Psychiatric:        Behavior: Behavior normal.       Assessment & Plan:   Problem List Items Addressed This Visit       Cardiovascular and Mediastinum   Essential hypertension     Endocrine   Type 2 diabetes mellitus without  complication, without long-term current use of insulin (HCC) - Primary   Relevant Orders   Bayer DCA Hb A1c Waived     Genitourinary   BPH (benign prostatic hyperplasia)   Relevant Medications   finasteride (PROSCAR) 5 MG tablet     Other   Hyperlipidemia with target LDL less than 100   Other Visit Diagnoses     Other insomnia       Relevant Medications   amitriptyline (ELAVIL) 50 MG tablet       Continue current medicine, no changes.  He wants to stop the Trulicity because is not helping him with his weight loss anymore. Follow up plan: Return in about 3 months (around 11/23/2021), or if symptoms worsen or fail to improve, for Diabetes recheck.  Counseling provided for all of the vaccine components Orders Placed This Encounter  Procedures   Bayer [place] Hb A1c [place] Hershey Knauer, MD [facility] 08/23/2021, 2:48 PM

## 2021-08-24 ENCOUNTER — Other Ambulatory Visit: Payer: Self-pay | Admitting: Physician Assistant

## 2021-08-26 ENCOUNTER — Other Ambulatory Visit: Payer: Self-pay | Admitting: Family Medicine

## 2021-08-28 NOTE — Chronic Care Management (AMB) (Signed)
  Care Management   Outreach Note  08/28/2021 Name: Johnny Mathews MRN: [id number] DOB: 08/05/1960  A second unsuccessful telephone outreach was attempted today. The patient was referred to the case management team for assistance with care management and care coordination.   Follow Up Plan:  A HIPAA compliant phone message was left for the patient providing contact information and requesting a return call.  The care management team will reach out to the patient again over the next 7 days.  If patient returns call to provider office, please advise to call [place] * at [phone]*  Noreene Larsson, [place], [place] [address] Direct Dial: [phone] [email or web address]

## 2021-08-30 ENCOUNTER — Other Ambulatory Visit: Payer: Self-pay | Admitting: Pulmonary Disease

## 2021-08-30 ENCOUNTER — Encounter: Payer: Self-pay | Admitting: Family Medicine

## 2021-08-30 DIAGNOSIS — E119 Type 2 diabetes mellitus without complications: Secondary | ICD-10-CM

## 2021-08-30 MED ORDER — TRULICITY 0.75 MG/0.5ML ~~LOC~~ SOAJ: 0.7500 mg | SUBCUTANEOUS | 3 refills | Status: DC

## 2021-09-03 DIAGNOSIS — Z4509 Encounter for adjustment and management of other cardiac device: Secondary | ICD-10-CM | POA: Diagnosis not present

## 2021-09-04 ENCOUNTER — Ambulatory Visit (INDEPENDENT_AMBULATORY_CARE_PROVIDER_SITE_OTHER): Payer: BC Managed Care – PPO | Admitting: General Surgery

## 2021-09-04 ENCOUNTER — Encounter: Payer: Self-pay | Admitting: General Surgery

## 2021-09-04 VITALS — BP 123/80 | HR 60 | Temp 98.2°F | Resp 16 | Ht 68.5 in | Wt 203.0 lb

## 2021-09-04 DIAGNOSIS — K4091 Unilateral inguinal hernia, without obstruction or gangrene, recurrent: Secondary | ICD-10-CM

## 2021-09-04 NOTE — Progress Notes (Signed)
[facility]  Had some irritated and stinging/ vibration pain on occasion. Called about gabapentin but when RN checked with Dr. Arnoldo Morale did not feel like it was appropriate at that time. Says it is no worse now and getting better.  BP 123/80   Pulse 60   Temp 98.2 F (36.8 C) (Oral)   Resp 16   Ht 5' 8.5" (1.74 m)   Wt 203 lb (92.1 kg)   SpO2 95%   BMI 30.42 kg/m  Right sided incision healing, lateral edge with < 0.5cm dehiscence superficially, covered with bandaid  Patient s/p R inguinal hernia repair with mess. Doing well overall. No continuous burning pain and nothing that he says he thinks he needs gabapentin for now.  No heavy lifting > 10 lbs, excessive bending, pushing, pulling, or squatting for 6-8 weeks after surgery.   Call with issues. Use a bandaid on the skin edge of the incision if it is rubbing on the pants.   Curlene Labrum, MD [facility] [address] [place], [place] [address] [phone] (office)

## 2021-09-04 NOTE — Chronic Care Management (AMB) (Signed)
  Care Coordination  Outreach Note  09/04/2021 Name: Johnny Mathews MRN: [id number] DOB: [birth year]   Care Coordination Outreach Attempts  A third unsuccessful outreach was attempted today to offer the patient with information about available care coordination services as a benefit of their health plan.   Follow Up Plan:  No further outreach attempts will be made at this time. We have been unable to contact the patient to offer or enroll patient in care coordination services  Encounter Outcome:  No Answer  Noreene Larsson, [place], [place] [address] Direct Dial: [phone] [email or web address]

## 2021-09-04 NOTE — Patient Instructions (Signed)
No heavy lifting > 10 lbs, excessive bending, pushing, pulling, or squatting for 6-8 weeks after surgery.   Call with issues. Use a bandaid on the skin edge of the incision if it is rubbing on the pants.

## 2021-09-05 ENCOUNTER — Encounter: Payer: Self-pay | Admitting: Family Medicine

## 2021-09-06 ENCOUNTER — Encounter: Payer: Self-pay | Admitting: Family Medicine

## 2021-09-06 ENCOUNTER — Ambulatory Visit: Payer: BC Managed Care – PPO | Admitting: Family Medicine

## 2021-09-06 VITALS — BP 130/77 | HR 66 | Temp 98.3°F | Ht 68.5 in | Wt 202.0 lb

## 2021-09-06 DIAGNOSIS — M62838 Other muscle spasm: Secondary | ICD-10-CM

## 2021-09-06 MED ORDER — CYCLOBENZAPRINE HCL 10 MG PO TABS: 10.0000 mg | ORAL_TABLET | Freq: Three times a day (TID) | ORAL | 3 refills | Status: DC | PRN

## 2021-09-06 NOTE — Progress Notes (Signed)
BP 130/77   Pulse 66   Temp 98.3 F (36.8 C)   Ht 5' 8.5" (1.74 m)   Wt 202 lb (91.6 kg)   SpO2 100%   BMI 30.27 kg/m    Subjective:   Patient ID: Johnny Mathews, male    DOB: [birth year], 61 y.o.   MRN: [id number]  HPI: Johnny Mathews is a 61 y.o. male presenting on 09/06/2021 for Muscles contracting   HPI Patient has been having a lot of muscle spasms and twitches recently that have been bothering him.  He says these have been increasing over the past few months and making things worse for him.  He says it is all over his body where he will get these muscle spasms and twitches and contractions that are sometimes painful and make it so has difficulty sleeping recently and difficulty relaxing.  He says it will be sporadic but sometimes can be very severe  Relevant past medical, surgical, family and social history reviewed and updated as indicated. Interim medical history since our last visit reviewed. Allergies and medications reviewed and updated.  Review of Systems  Constitutional:  Negative for chills and fever.  Respiratory:  Negative for shortness of breath and wheezing.   Cardiovascular:  Negative for chest pain and leg swelling.  Musculoskeletal:  Positive for arthralgias and myalgias. Negative for back pain and gait problem.  Skin:  Negative for rash.  All other systems reviewed and are negative.   Per HPI unless specifically indicated above   Allergies as of 09/06/2021       Reactions   Eggs Or Egg-derived Products Swelling   Throat- 03-31-2019 pt states can eat cakes and Pie with no issues  Throat- 03-31-2019 pt states can eat cakes and Pie with no issues    Septra [sulfamethoxazole-trimethoprim] Swelling   throat   Sulfonamide Derivatives Hives        Medication List        Accurate as of September 06, 2021 10:30 AM. If you have any questions, ask your nurse or doctor.          albuterol 108 (90 Base) MCG/ACT inhaler Commonly known as: Ventolin  HFA Inhale 2 puffs into the lungs every 4 (four) hours as needed. (Needs to be seen before next refill)   albuterol (2.5 MG/3ML) 0.083% nebulizer solution Commonly known as: PROVENTIL NEBULIZE 1 VIAL EVERY 6 HOURS AS NEEDED FOR WHEEZING & SHORTNESS OF BREATH   amitriptyline 50 MG tablet Commonly known as: ELAVIL Take 1 tablet (50 mg total) by mouth at bedtime.   azelastine 0.1 % nasal spray Commonly known as: ASTELIN Use in each nostril as directed   Breztri Aerosphere 160-9-4.8 MCG/ACT Aero Generic drug: Budeson-Glycopyrrol-Formoterol Inhale 2 puffs into the lungs in the morning and at bedtime. What changed: when to take this   Contour Blood Glucose System w/Device Kit Test blood sugars four times daily   cyclobenzaprine 10 MG tablet Commonly known as: FLEXERIL Take 1 tablet (10 mg total) by mouth 3 (three) times daily as needed for muscle spasms. Started by: Fransisca Kaufmann Cote Mayabb, MD   EPINEPHrine 0.3 mg/0.3 mL Soaj injection Commonly known as: EPI-PEN INJECT 0.3ML (0.3MG) IM ONCE   esomeprazole 40 MG capsule Commonly known as: NEXIUM TAKE 1 CAPSULE DAILY AT NOON What changed:  how much to take how to take this when to take this additional instructions   famotidine 20 MG tablet Commonly known as: PEPCID Take 1 tablet (20 mg total) by  mouth 2 (two) times daily. What changed:  how much to take when to take this   ferrous sulfate 325 (65 FE) MG tablet Take 325 mg by mouth daily with breakfast.   finasteride 5 MG tablet Commonly known as: PROSCAR Take one tablet daily   fluticasone 50 MCG/ACT nasal spray Commonly known as: FLONASE USE 1 TO 2 SPRAYS IN EACH NOSTRIL DAILY What changed:  how much to take when to take this   furosemide 20 MG tablet Commonly known as: LASIX Take 1 tablet (20 mg total) by mouth daily as needed. What changed: when to take this   glucose blood test strip Commonly known as: Contour Next Test Test blood sugars four times daily    losartan 50 MG tablet Commonly known as: COZAAR Take 1 tablet (50 mg total) by mouth daily. What changed: when to take this   meloxicam 7.5 MG tablet Commonly known as: MOBIC TAKE ONE (1) TABLET EACH DAY What changed:  how much to take how to take this when to take this additional instructions   metoprolol succinate 50 MG 24 hr tablet Commonly known as: TOPROL-XL Take 50 mg by mouth daily. Take with or immediately following a meal.   montelukast 10 MG tablet Commonly known as: SINGULAIR TAKE ONE TABLET DAILY AT BEDTIME   pravastatin 80 MG tablet Commonly known as: PRAVACHOL Take one tablet daily What changed:  how much to take how to take this when to take this additional instructions   sildenafil 20 MG tablet Commonly known as: REVATIO TAKE 1 TO 3 TABLETS DAILY AS NEEDED   Spacer/Aero-Holding Dorise Bullion Use with inhaler daily   SYRINGE-NEEDLE (DISP) 3 ML 21G X 1-1/2" 3 ML Misc Use to inject testosterone every week   tamsulosin 0.4 MG Caps capsule Commonly known as: FLOMAX Take one tablet after supper What changed:  how much to take how to take this when to take this additional instructions   testosterone cypionate 100 MG/ML injection Commonly known as: DEPOTESTOTERONE CYPIONATE Inject 0.5 mLs (50 mg total) into the muscle every 7 (seven) days. For IM use only What changed: when to take this   Trulicity 8.36 OQ/9.4TM Sopn Generic drug: Dulaglutide Inject 0.75 mg into the skin once a week.         Objective:   BP 130/77   Pulse 66   Temp 98.3 F (36.8 C)   Ht 5' 8.5" (1.74 m)   Wt 202 lb (91.6 kg)   SpO2 100%   BMI 30.27 kg/m   Wt Readings from Last 3 Encounters:  09/06/21 202 lb (91.6 kg)  09/04/21 203 lb (92.1 kg)  08/23/21 204 lb (92.5 kg)    Physical Exam Vitals and nursing note reviewed.  Constitutional:      Appearance: Normal appearance.  Musculoskeletal:        General: No swelling, tenderness, deformity or signs of  injury.     Comments: Patient has muscle spasms and twitching and what almost feels like cogwheeling in arms and his legs but that goes away after repetitive motion.  Neurological:     Mental Status: He is alert.       Assessment & Plan:   Problem List Items Addressed This Visit   None Visit Diagnoses     Muscle spasm    -  Primary   Relevant Medications   cyclobenzaprine (FLEXERIL) 10 MG tablet       Concern for this being related to his muscular dystrophy, will give  a muscle relaxer to see if it helps but also consider physical therapy. Follow up plan: Return if symptoms worsen or fail to improve.  Counseling provided for all of the vaccine components No orders of the defined types were placed in this encounter.   Caryl Pina, MD [facility] 09/06/2021, 10:30 AM

## 2021-09-10 ENCOUNTER — Other Ambulatory Visit: Payer: Self-pay | Admitting: Family Medicine

## 2021-09-23 DIAGNOSIS — Z4509 Encounter for adjustment and management of other cardiac device: Secondary | ICD-10-CM | POA: Diagnosis not present

## 2021-10-01 DIAGNOSIS — Z4509 Encounter for adjustment and management of other cardiac device: Secondary | ICD-10-CM | POA: Diagnosis not present

## 2021-10-04 ENCOUNTER — Encounter: Payer: Self-pay | Admitting: Family Medicine

## 2021-10-04 DIAGNOSIS — G7100 Muscular dystrophy, unspecified: Secondary | ICD-10-CM

## 2021-10-04 DIAGNOSIS — G4709 Other insomnia: Secondary | ICD-10-CM

## 2021-10-04 MED ORDER — AMITRIPTYLINE HCL 100 MG PO TABS: 200.0000 mg | ORAL_TABLET | Freq: Every day | ORAL | 1 refills | Status: DC

## 2021-10-08 DIAGNOSIS — Z4509 Encounter for adjustment and management of other cardiac device: Secondary | ICD-10-CM | POA: Diagnosis not present

## 2021-10-14 ENCOUNTER — Other Ambulatory Visit: Payer: BC Managed Care – PPO

## 2021-10-14 DIAGNOSIS — E291 Testicular hypofunction: Secondary | ICD-10-CM | POA: Diagnosis not present

## 2021-10-19 LAB — TESTOSTERONE, FREE, TOTAL, SHBG
Sex Hormone Binding: 71.5 nmol/L (ref 19.3–76.4)
Testosterone, Free: 12.8 pg/mL (ref 6.6–18.1)
Testosterone: 614 ng/dL (ref 264–916)

## 2021-10-22 ENCOUNTER — Ambulatory Visit: Payer: BC Managed Care – PPO | Admitting: "Endocrinology

## 2021-10-22 ENCOUNTER — Encounter: Payer: Self-pay | Admitting: "Endocrinology

## 2021-10-22 VITALS — BP 108/66 | HR 64 | Ht 68.5 in | Wt 207.4 lb

## 2021-10-22 DIAGNOSIS — E782 Mixed hyperlipidemia: Secondary | ICD-10-CM

## 2021-10-22 DIAGNOSIS — E291 Testicular hypofunction: Secondary | ICD-10-CM | POA: Diagnosis not present

## 2021-10-22 MED ORDER — TESTOSTERONE CYPIONATE 100 MG/ML IM SOLN: 100.0000 mg | INTRAMUSCULAR | 0 refills | Status: DC

## 2021-10-22 NOTE — Progress Notes (Signed)
10/22/2021     Endocrinology follow-up note   Johnny Mathews, 61 y.o., male   Chief Complaint  Patient presents with   Follow-up    Hypogonadism, male     Past Medical History:  Diagnosis Date   Allergy    Alpha galactosidase deficiency    Anemia    past hx    Arthritis    Asthma    Blood transfusion without reported diagnosis    BPH (benign prostatic hypertrophy)    Cataract    removed left eye    Complication of anesthesia    Diabetes mellitus without complication (HCC)    Dysrhythmia    GERD (gastroesophageal reflux disease)    Heart murmur    History of kidney stones    HOH (hard of hearing)    Hyperlipidemia    Hypertension    Irregular heart beat    Muscular dystrophy ([place])    PONV (postoperative nausea and vomiting)    Sleep apnea    Wears hearing aid in both ears    Past Surgical History:  Procedure Laterality Date   CATARACT EXTRACTION Left    COLONOSCOPY     ESOPHAGOGASTRODUODENOSCOPY N/A 08/27/2020   Procedure: ESOPHAGOGASTRODUODENOSCOPY (EGD);  Surgeon: Lajuana Matte, MD;  Location: [facility];  Service: Thoracic;  Laterality: N/A;   FEMUR FRACTURE SURGERY     INGUINAL HERNIA REPAIR Right 08/07/2021   Procedure: HERNIA REPAIR INGUINAL ADULT WITH MESH;  Surgeon: Virl Cagey, MD;  Location: [facility];  Service: General;  Laterality: Right;   INSERTION OF MESH N/A 08/27/2020   Procedure: INSERTION OF ACELL 7.5 x 6cm GENTRIX SURGICAL MATRIX HIATAL MESH;  Surgeon: Lajuana Matte, MD;  Location: [place];  Service: Thoracic;  Laterality: N/A;   KIDNEY STONE SURGERY     x6   KNEE SURGERY Right    x2   MASS EXCISION Right 08/02/2018   Procedure: EXCISION RIGHT LONG FINGER MASS, DEBRIDEMENT PROXIMAL INTERPHALANGEAL JOINT WITH ROTATION FLAP;  Surgeon: Leanora Cover, MD;  Location: [facility];  Service: Orthopedics;  Laterality: Right;   NISSEN FUNDOPLICATION     [facility]    SHOULDER OPEN  ROTATOR CUFF REPAIR     XI ROBOTIC ASSISTED HIATAL HERNIA REPAIR N/A 08/27/2020   Procedure: XI ROBOTIC ASSISTED REDO HIATAL HERNIA REPAIR;  Surgeon: Lajuana Matte, MD;  Location: [place];  Service: Thoracic;  Laterality: N/A;   Social History   Socioeconomic History   Marital status: Married    Spouse name: Not on file   Number of children: Not on file   Years of education: Not on file   Highest education level: Not on file  Occupational History   Occupation: Physiological scientist  Tobacco Use   Smoking status: Former    Packs/day: 1.00    Years: 10.00    Total pack years: 10.00    Types: Cigarettes    Quit date: 01/06/1994    Years since quitting: 27.8   Smokeless tobacco: Current    Types: Snuff  Vaping Use   Vaping Use: Never used  Substance and Sexual Activity   Alcohol use: Yes    Alcohol/week: 2.0 - 3.0 standard drinks of alcohol    Types: 2 - 3 Standard drinks or equivalent per week    Comment: social   Drug use: No   Sexual activity: Not on file  Other Topics Concern   Not on file  Social History Narrative   Not on file  Social Determinants of Health   Financial Resource Strain: Not on file  Food Insecurity: Not on file  Transportation Needs: Not on file  Physical Activity: Not on file  Stress: Not on file  Social Connections: Not on file   Outpatient Encounter Medications as of 10/22/2021  Medication Sig   albuterol (PROVENTIL) (2.5 MG/3ML) 0.083% nebulizer solution NEBULIZE 1 VIAL EVERY 6 HOURS AS NEEDED FOR WHEEZING & SHORTNESS OF BREATH   albuterol (VENTOLIN HFA) 108 (90 Base) MCG/ACT inhaler Inhale 2 puffs into the lungs every 4 (four) hours as needed. (Needs to be seen before next refill)   amitriptyline (ELAVIL) 100 MG tablet Take 2 tablets (200 mg total) by mouth at bedtime.   azelastine (ASTELIN) 0.1 % nasal spray Use in each nostril as directed   Blood Glucose Monitoring Suppl (CONTOUR BLOOD GLUCOSE SYSTEM) w/Device KIT Test blood sugars four  times daily   BREZTRI AEROSPHERE 160-9-4.8 MCG/ACT AERO USE 2 PUFFS EVERY MORNING AND AT BEDTIME   cyclobenzaprine (FLEXERIL) 10 MG tablet Take 1 tablet (10 mg total) by mouth 3 (three) times daily as needed for muscle spasms.   Dulaglutide (TRULICITY) 0.99 IP/3.8SN SOPN Inject 0.75 mg into the skin once a week.   EPINEPHRINE 0.3 mg/0.3 mL IJ SOAJ injection INJECT 0.3ML (0.3MG) IM ONCE   esomeprazole (NEXIUM) 40 MG capsule TAKE 1 CAPSULE DAILY AT NOON (Patient taking differently: Take 40 mg by mouth daily.)   famotidine (PEPCID) 20 MG tablet Take 1 tablet (20 mg total) by mouth 2 (two) times daily. (Patient taking differently: Take 40 mg by mouth at bedtime.)   ferrous sulfate 325 (65 FE) MG tablet Take 325 mg by mouth daily with breakfast.   finasteride (PROSCAR) 5 MG tablet Take one tablet daily   fluticasone (FLONASE) 50 MCG/ACT nasal spray USE 1 TO 2 SPRAYS IN EACH NOSTRIL DAILY (Patient taking differently: Place 2 sprays into both nostrils at bedtime.)   furosemide (LASIX) 20 MG tablet Take 1 tablet (20 mg total) by mouth daily as needed. (Patient taking differently: Take 20 mg by mouth daily.)   glucose blood (CONTOUR NEXT TEST) test strip Test blood sugars four times daily   losartan (COZAAR) 50 MG tablet Take 1 tablet (50 mg total) by mouth daily. (Patient taking differently: Take 50 mg by mouth in the morning and at bedtime.)   meloxicam (MOBIC) 7.5 MG tablet TAKE ONE (1) TABLET EACH DAY (Patient taking differently: Take 7.5 mg by mouth at bedtime.)   metoprolol succinate (TOPROL-XL) 50 MG 24 hr tablet Take 50 mg by mouth daily. Take with or immediately following a meal.   montelukast (SINGULAIR) 10 MG tablet TAKE ONE TABLET DAILY AT BEDTIME   pravastatin (PRAVACHOL) 80 MG tablet Take one tablet daily (Patient taking differently: Take 80 mg by mouth at bedtime.)   sildenafil (REVATIO) 20 MG tablet TAKE 1 TO 3 TABLETS DAILY AS NEEDED   Spacer/Aero-Holding Chambers DEVI Use with inhaler  daily   SYRINGE-NEEDLE, DISP, 3 ML 21G X 1-1/2" 3 ML MISC Use to inject testosterone every week   tamsulosin (FLOMAX) 0.4 MG CAPS capsule Take one tablet after supper (Patient taking differently: Take 0.4 mg by mouth daily.)   testosterone cypionate (DEPOTESTOTERONE CYPIONATE) 100 MG/ML injection Inject 1 mL (100 mg total) into the muscle every 14 (fourteen) days. For IM use only   [DISCONTINUED] testosterone cypionate (DEPOTESTOTERONE CYPIONATE) 100 MG/ML injection Inject 0.5 mLs (50 mg total) into the muscle every 7 (seven) days. For IM use only (  Patient taking differently: Inject 50 mg into the muscle every Monday. For IM use only)   No facility-administered encounter medications on file as of 10/22/2021.   ALLERGIES: Allergies  Allergen Reactions   Eggs Or Egg-Derived Products Swelling    Throat- 03-31-2019 pt states can eat cakes and Pie with no issues  Throat- 03-31-2019 pt states can eat cakes and Pie with no issues    Septra [Sulfamethoxazole-Trimethoprim] Swelling    throat   Sulfonamide Derivatives Hives    VACCINATION STATUS: Immunization History  Administered Date(s) Administered   Influenza Inj Mdck Quad Pf 09/24/2018, 10/22/2020   Influenza, Quadrivalent, Recombinant, Inj, Pf 01/15/2017   Influenza-Unspecified 09/24/2018, 10/31/2019   Moderna SARS-COV2 Booster Vaccination 04/26/2020   Moderna Sars-Covid-2 Vaccination 01/15/2019, 02/12/2019, 09/07/2019   Pneumococcal Polysaccharide-23 11/09/2019   Tdap 07/20/2009, 11/09/2019   Zoster Recombinat (Shingrix) 08/22/2020, 02/22/2021    HPI: Johnny Mathews is a 61 y.o.-year-old man.  He is returning for a follow-up after he was seen in consultation for hypogonadism.  He does not have new complaints today. He notes from previous visits. He reports that he has hypogonadism for as long as he remembers, does not father any biological children although he adopted 3 kids.  He reports to have been told that he cannot father any  children due to low sperm count. -He was treated with testosterone pellets in a clinic in [place] on 2 separate occasions between 2018 and 2019 , the dose details are not available to review.   After from testosterone treatment, during his last visit, he was reinitiated on testosterone injections.  He returns with total testosterone 614 increasing from 66. He reports symptomatic improvement including having more energy.  He denies  trauma to testes,  chemotherapy,  testicular irradiation,  nor genitourinary surgery. Denies new complaints since last visit.  He wishes to be continued on testosterone treatment.   -His recent labs show normal CBC, PSA   -  He has asthma on various inhalers. No chronic pain. Not on opiates, does not take steroids.    He does not have family  Or personal history of premature  cardiac disease.  ROS: Limited as above.  PE: BP 108/66   Pulse 64   Ht 5' 8.5" (1.74 m)   Wt 207 lb 6.4 oz (94.1 kg)   BMI 31.08 kg/m  Wt Readings from Last 3 Encounters:  10/22/21 207 lb 6.4 oz (94.1 kg)  09/06/21 202 lb (91.6 kg)  09/04/21 203 lb (92.1 kg)    Genital exam: normal male escutcheon, no inguinal LAD, normal phallus, significantly shrunk testes bilaterally to 5 mL, no testicular or scrotal mass lesions.  no penile discharge.  No gynecomastia.   CMP ( most recent) CMP     Component Value Date/Time   NA 144 05/22/2021 1400   K 5.3 (H) 05/22/2021 1400   CL 102 05/22/2021 1400   CO2 27 05/22/2021 1400   GLUCOSE 86 05/22/2021 1400   GLUCOSE 88 08/28/2020 0618   BUN 16 05/22/2021 1400   CREATININE 1.41 (H) 05/22/2021 1400   CALCIUM 9.7 05/22/2021 1400   PROT 6.8 05/22/2021 1400   ALBUMIN 4.5 05/22/2021 1400   AST 22 05/22/2021 1400   ALT 22 05/22/2021 1400   ALKPHOS 113 05/22/2021 1400   BILITOT 0.3 05/22/2021 1400   GFRNONAA >60 08/28/2020 0618   GFRAA 74 02/23/2020 0929     Diabetic Labs (most recent): Lab Results  Component Value Date   HGBA1C  5.2  08/23/2021   HGBA1C 4.9 05/22/2021   HGBA1C 4.9 02/22/2021     Lipid Panel ( most recent) Lipid Panel     Component Value Date/Time   CHOL 165 06/28/2021 0807   TRIG 110 06/28/2021 0807   HDL 59 06/28/2021 0807   CHOLHDL 2.8 06/28/2021 0807   CHOLHDL 4.8 03/30/2009 1807   VLDL 20 03/30/2009 1807   LDLCALC 86 06/28/2021 0807    Recent Results (from the past 2160 hour(s))  Glucose, capillary     Status: None   Collection Time: 08/07/21  7:56 AM  Result Value Ref Range   Glucose-Capillary 76 70 - 99 mg/dL    Comment: Glucose reference range applies only to samples taken after fasting for at least 8 hours.  Glucose, capillary     Status: None   Collection Time: 08/07/21 10:48 AM  Result Value Ref Range   Glucose-Capillary 78 70 - 99 mg/dL    Comment: Glucose reference range applies only to samples taken after fasting for at least 8 hours.  Bayer DCA Hb A1c Waived     Status: None   Collection Time: 08/23/21  2:05 PM  Result Value Ref Range   HB A1C (BAYER DCA - WAIVED) 5.2 4.8 - 5.6 %    Comment:          Prediabetes: 5.7 - 6.4          Diabetes: >6.4          Glycemic control for adults with diabetes: <7.0   Testosterone, Free, Total, SHBG     Status: None   Collection Time: 10/14/21  9:45 AM  Result Value Ref Range   Testosterone 614 264 - 916 ng/dL    Comment: Adult male reference interval is based on a population of healthy nonobese males (BMI <30) between 57 and 24 years old. [place], [place] [phone]. PMID: [id number].    Testosterone, Free 12.8 6.6 - 18.1 pg/mL   Sex Hormone Binding 71.5 19.3 - 76.4 nmol/L     ASSESSMENT:  1. Hypogonadism   PLAN:  See notes from previous visits.  He returns with significant improvement in his total testosterone to 614 improving from 66.  He is not responding to injectable testosterone preparations.  He wishes to be continued on testosterone replacement.     I discussed and adjusted his testosterone to 100  mg IM every 14 days  with a plan to give him 200 mg a month until next measurement. -  I discussed adverse effects of unnecessary testosterone replacement short-term and long-term.  In this patient with bilaterally shrunk testicles, his hypogonadism is likely chronic and primary. I discussed with him the fact that testosterone replacement will further diminish his chance of fertility.  At this point, he is not interested to keep his fertility.  -Given his medical history of sleep apnea and BPH which are relative contraindications for testosterone replacement, his testosterone dose will be titrated slowly based on his clinical response.    -He is already on PDE 5 inhibitors for ED.   Did have history of diabetes, which seems to have reversed.  His A1c was 4.9% in May 2023.  He remains on Trulicity 6.22 mg subcutaneously weekly.     He is encouraged to keep close follow-up with his PMD.   I spent 23 minutes in the care of the patient today including review of labs for hypogonadism, and other relevant labs ; imaging/biopsy records (current and previous including abstractions from other facilities);  face-to-face time discussing  his lab results and symptoms, medications doses, his options of short and long term treatment based on the latest standards of care / guidelines;   and documenting the encounter.  Johnny Mathews  participated in the discussions, expressed understanding, and voiced agreement with the above plans.  All questions were answered to his satisfaction. he is encouraged to contact clinic should he have any questions or concerns prior to his return visit.     Return in about 4 months (around 02/22/2022) for Fasting Labs  in AM B4 8, A1c -NV.  Glade Lloyd, MD [facility] [address] [place], [place] [address] Phone: [phone]  Fax: [phone]   10/22/2021, 4:58 PM  This note was partially dictated with voice  recognition software. Similar sounding words can be transcribed inadequately or may not  be corrected upon review.

## 2021-10-22 NOTE — Patient Instructions (Signed)

## 2021-10-23 ENCOUNTER — Other Ambulatory Visit: Payer: Self-pay | Admitting: Physician Assistant

## 2021-10-23 ENCOUNTER — Other Ambulatory Visit: Payer: Self-pay | Admitting: Family Medicine

## 2021-10-23 DIAGNOSIS — R0602 Shortness of breath: Secondary | ICD-10-CM

## 2021-10-23 DIAGNOSIS — U071 COVID-19: Secondary | ICD-10-CM

## 2021-10-24 ENCOUNTER — Encounter: Payer: Self-pay | Admitting: Physical Therapy

## 2021-10-24 ENCOUNTER — Other Ambulatory Visit: Payer: Self-pay

## 2021-10-24 ENCOUNTER — Ambulatory Visit: Payer: BC Managed Care – PPO | Attending: Family Medicine | Admitting: Physical Therapy

## 2021-10-24 DIAGNOSIS — M5459 Other low back pain: Secondary | ICD-10-CM | POA: Diagnosis not present

## 2021-10-24 DIAGNOSIS — G7100 Muscular dystrophy, unspecified: Secondary | ICD-10-CM | POA: Diagnosis not present

## 2021-10-24 DIAGNOSIS — M62838 Other muscle spasm: Secondary | ICD-10-CM | POA: Diagnosis not present

## 2021-10-24 NOTE — Therapy (Signed)
OUTPATIENT PHYSICAL THERAPY THORACOLUMBAR EVALUATION   Patient Name: Johnny Mathews MRN: [id number] DOB:December 23, 1960, 61 y.o., male Today's Date: 10/24/2021    Past Medical History:  Diagnosis Date   Allergy    Alpha galactosidase deficiency    Anemia    past hx    Arthritis    Asthma    Blood transfusion without reported diagnosis    BPH (benign prostatic hypertrophy)    Cataract    removed left eye    Complication of anesthesia    Diabetes mellitus without complication (HCC)    Dysrhythmia    GERD (gastroesophageal reflux disease)    Heart murmur    History of kidney stones    HOH (hard of hearing)    Hyperlipidemia    Hypertension    Irregular heart beat    Muscular dystrophy ([place])    PONV (postoperative nausea and vomiting)    Sleep apnea    Wears hearing aid in both ears    Past Surgical History:  Procedure Laterality Date   CATARACT EXTRACTION Left    COLONOSCOPY     ESOPHAGOGASTRODUODENOSCOPY N/A 08/27/2020   Procedure: ESOPHAGOGASTRODUODENOSCOPY (EGD);  Surgeon: Lajuana Matte, MD;  Location: [facility];  Service: Thoracic;  Laterality: N/A;   FEMUR FRACTURE SURGERY     INGUINAL HERNIA REPAIR Right 08/07/2021   Procedure: HERNIA REPAIR INGUINAL ADULT WITH MESH;  Surgeon: Virl Cagey, MD;  Location: [facility];  Service: General;  Laterality: Right;   INSERTION OF MESH N/A 08/27/2020   Procedure: INSERTION OF ACELL 7.5 x 6cm GENTRIX SURGICAL MATRIX HIATAL MESH;  Surgeon: Lajuana Matte, MD;  Location: [facility];  Service: Thoracic;  Laterality: N/A;   KIDNEY STONE SURGERY     x6   KNEE SURGERY Right    x2   MASS EXCISION Right 08/02/2018   Procedure: EXCISION RIGHT LONG FINGER MASS, DEBRIDEMENT PROXIMAL INTERPHALANGEAL JOINT WITH ROTATION FLAP;  Surgeon: Leanora Cover, MD;  Location: [facility];  Service: Orthopedics;  Laterality: Right;   NISSEN FUNDOPLICATION     [facility]    SHOULDER OPEN ROTATOR CUFF REPAIR     XI ROBOTIC ASSISTED HIATAL  HERNIA REPAIR N/A 08/27/2020   Procedure: XI ROBOTIC ASSISTED REDO HIATAL HERNIA REPAIR;  Surgeon: Lajuana Matte, MD;  Location: [facility];  Service: Thoracic;  Laterality: N/A;   Patient Active Problem List   Diagnosis Date Noted   Recurrent right inguinal hernia    Hiatal hernia 08/27/2020   BPH (benign prostatic hyperplasia) 11/09/2019   Anginal equivalent 01/18/2019   Chronic heart failure with preserved ejection fraction ([place]) 01/18/2019   Osteoarthritis of finger of right hand 07/21/2018   Hypogonadism, male 03/23/2018   Allergy to meat 06/21/2015   Essential hypertension 02/05/2015   Sciatica of left side 10/02/2014   GERD (gastroesophageal reflux disease) 12/23/2012   Hyperlipidemia with target LDL less than 100 12/23/2012   Type 2 diabetes mellitus without complication, without long-term current use of insulin ([place]) 12/23/2012   Asthma, mild intermittent 12/23/2012   Muscular dystrophy ([place]) 04/29/2012     REFERRING PROVIDER: Vonna Kotyk Dettinger MD  REFERRING DIAG: Muscular Dystrophy  Rationale for Evaluation and Treatment Rehabilitation  THERAPY DIAG:  Other low back pain  Other muscle spasm  ONSET DATE: ~6 months ago.  SUBJECTIVE:  SUBJECTIVE STATEMENT: The patient presents to the clinic with a CC of low back pain that is rated at a 8/10 that that can become severe.  He states this has been ongoing for about 6 months.  Sitting down reduces his pain and standing and walking increase his pain.  He also experiences occasional muscle spasms and involuntary movements in his extremities.    PERTINENT HISTORY:  Muscular dystrophy, DM, h/o neck pain.  PAIN:  Are you having pain? Yes: NPRS scale: 8/10 Pain location: Lumbar region. Pain description: Ache, "muscle twitch." Aggravating  factors: As above. Relieving factors: As above.  PRECAUTIONS: None  WEIGHT BEARING RESTRICTIONS: No  FALLS:  Has patient fallen in last 6 months? No  LIVING ENVIRONMENT: Lives with: lives with their spouse Lives in: House/apartment Has following equipment at home: None  OCCUPATION: Work at a [employer].  PLOF: Independent  PATIENT GOALS: Not have these muscle spasm and be rid of his low back pain.   OBJECTIVE:   PATIENT SURVEYS:  FOTO Complete.  POSTURE: rounded shoulders and forward head  PALPATION: Patient c/o diffuse lumbar pain from L1 to L4 with notable muscle tone over his erector spinae musculature left > right.  LUMBAR ROM:   WFL for active lumbar flexion and extension.  LOWER EXTREMITY MMT:   Normal LE strength. SPECIAL TEST:      No significant pain with SLR testing.  Today's Tx:  HMP and IFC at 80-150 Hz on 40% scan x 20 minutes to patient's bilateral lumbar region.  Patient enjoyed treatment with a normal modality response following removal of modality.  ASSESSMENT:  CLINICAL IMPRESSION: The patient presents to the clinic with a CC of low back pain that can become severe at times.  Prolonged standing and walking increase his pain to severe levels at times.  He has functional lumbar flexion and extension and normal LE strength.  No pain reproduction with SLR testing.  He c/o diffuse lumbar pain from L1 to L4 and has a significant amount of muscle tone, left greater than right.  Patient will benefit from skilled physical therapy intervention to address pain and deficits.  OBJECTIVE IMPAIRMENTS: decreased activity tolerance, increased muscle spasms, and pain.   ACTIVITY LIMITATIONS: standing and locomotion level  PARTICIPATION LIMITATIONS: cleaning, laundry, and yard work  PERSONAL FACTORS: 1 comorbidity: muscular dystropy  are also affecting patient's functional outcome.   REHAB POTENTIAL: Good  CLINICAL DECISION MAKING: Evolving/moderate  complexity  EVALUATION COMPLEXITY: Low   GOALS:  LONG TERM GOALS: Target date: 12/05/2021  Ind with an HEP. Baseline:  Goal status: INITIAL  2.  Stand 20 minutes with pain not > 3/10. Baseline:  Goal status: INITIAL  3.  Walk a community distance with pain not > 3/10. Baseline:  Goal status: INITIAL   PLAN:  PT FREQUENCY: 1-2x/week  PT DURATION: 6 weeks  PLANNED INTERVENTIONS: Therapeutic exercises, Therapeutic activity, Neuromuscular re-education, Balance training, Gait training, Patient/Family education, Self Care, Joint mobilization, Dry Needling, Electrical stimulation, Cryotherapy, Moist heat, Ultrasound, and Manual therapy.  PLAN FOR NEXT SESSION: Combo e'stim/US, STW/M, SKTC, core exercise progression.   Garvin Ellena, [place], PT 10/24/2021, 3:56 PM

## 2021-10-28 ENCOUNTER — Other Ambulatory Visit: Payer: Self-pay | Admitting: Family Medicine

## 2021-10-28 DIAGNOSIS — R0602 Shortness of breath: Secondary | ICD-10-CM

## 2021-10-28 DIAGNOSIS — U071 COVID-19: Secondary | ICD-10-CM

## 2021-10-29 ENCOUNTER — Encounter: Payer: Self-pay | Admitting: Physical Therapy

## 2021-10-29 ENCOUNTER — Ambulatory Visit: Payer: BC Managed Care – PPO | Admitting: Physical Therapy

## 2021-10-29 ENCOUNTER — Encounter: Payer: Self-pay | Admitting: Family Medicine

## 2021-10-29 DIAGNOSIS — M62838 Other muscle spasm: Secondary | ICD-10-CM | POA: Diagnosis not present

## 2021-10-29 DIAGNOSIS — G7100 Muscular dystrophy, unspecified: Secondary | ICD-10-CM | POA: Diagnosis not present

## 2021-10-29 DIAGNOSIS — M5459 Other low back pain: Secondary | ICD-10-CM

## 2021-10-29 NOTE — Therapy (Addendum)
OUTPATIENT PHYSICAL THERAPY THORACOLUMBAR TREATMENT   Patient Name: Johnny Mathews MRN: [id number] DOB:07-31-1960, 61 y.o., male Today's Date: 10/29/2021   PT End of Session - 10/29/21 1600     Visit Number 2    Number of Visits 6    Date for PT Re-Evaluation 12/05/21    Authorization Type FOTO.    PT Start Time 1520    PT Stop Time 1600    PT Time Calculation (min) 40 min    Activity Tolerance Patient tolerated treatment well    Behavior During Therapy WFL for tasks assessed/performed            Past Medical History:  Diagnosis Date   Allergy    Alpha galactosidase deficiency    Anemia    past hx    Arthritis    Asthma    Blood transfusion without reported diagnosis    BPH (benign prostatic hypertrophy)    Cataract    removed left eye    Complication of anesthesia    Diabetes mellitus without complication (HCC)    Dysrhythmia    GERD (gastroesophageal reflux disease)    Heart murmur    History of kidney stones    HOH (hard of hearing)    Hyperlipidemia    Hypertension    Irregular heart beat    Muscular dystrophy (HCC)    PONV (postoperative nausea and vomiting)    Sleep apnea    Wears hearing aid in both ears    Past Surgical History:  Procedure Laterality Date   CATARACT EXTRACTION Left    COLONOSCOPY     ESOPHAGOGASTRODUODENOSCOPY N/A 08/27/2020   Procedure: ESOPHAGOGASTRODUODENOSCOPY (EGD);  Surgeon: Lajuana Matte, MD;  Location: [facility];  Service: Thoracic;  Laterality: N/A;   FEMUR FRACTURE SURGERY     INGUINAL HERNIA REPAIR Right 08/07/2021   Procedure: HERNIA REPAIR INGUINAL ADULT WITH MESH;  Surgeon: Virl Cagey, MD;  Location: [facility];  Service: General;  Laterality: Right;   INSERTION OF MESH N/A 08/27/2020   Procedure: INSERTION OF ACELL 7.5 x 6cm GENTRIX SURGICAL MATRIX HIATAL MESH;  Surgeon: Lajuana Matte, MD;  Location: [facility];  Service: Thoracic;  Laterality: N/A;   KIDNEY STONE SURGERY     x6   KNEE SURGERY Right    x2    MASS EXCISION Right 08/02/2018   Procedure: EXCISION RIGHT LONG FINGER MASS, DEBRIDEMENT PROXIMAL INTERPHALANGEAL JOINT WITH ROTATION FLAP;  Surgeon: Leanora Cover, MD;  Location: [facility];  Service: Orthopedics;  Laterality: Right;   NISSEN FUNDOPLICATION     [facility]    SHOULDER OPEN ROTATOR CUFF REPAIR     XI ROBOTIC ASSISTED HIATAL HERNIA REPAIR N/A 08/27/2020   Procedure: XI ROBOTIC ASSISTED REDO HIATAL HERNIA REPAIR;  Surgeon: Lajuana Matte, MD;  Location: [facility];  Service: Thoracic;  Laterality: N/A;   Patient Active Problem List   Diagnosis Date Noted   Recurrent right inguinal hernia    Hiatal hernia 08/27/2020   BPH (benign prostatic hyperplasia) 11/09/2019   Anginal equivalent 01/18/2019   Chronic heart failure with preserved ejection fraction ([place]) 01/18/2019   Osteoarthritis of finger of right hand 07/21/2018   Hypogonadism, male 03/23/2018   Allergy to meat 06/21/2015   Essential hypertension 02/05/2015   Sciatica of left side 10/02/2014   GERD (gastroesophageal reflux disease) 12/23/2012   Hyperlipidemia with target LDL less than 100 12/23/2012   Type 2 diabetes mellitus without complication, without long-term current use of insulin ([place])  12/23/2012   Asthma, mild intermittent 12/23/2012   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  SUBJECTIVE:                                                                                                                                                                                           SUBJECTIVE STATEMENT: Bad enough to not avoid. Pain is worse with standing.  PERTINENT HISTORY:  Muscular dystrophy, DM, h/o neck pain.  PAIN:  Are you having pain? Yes: NPRS scale: 6/10 Pain location:  Lumbar region. Pain description: Ache, "muscle twitch." Aggravating factors: As above. Relieving factors: As above.  PRECAUTIONS: None  PATIENT GOALS: Not have these muscle spasm and be rid of his low back pain.  OBJECTIVE:   PATIENT SURVEYS:  FOTO Complete.  Today's Tx: Modalities  Date: 10/24 Unattended Estim: Lumbar, IFC, 15 mins, Pain and Tone Combo: Lumbar, 1.5w/cm2, 100%, 10 mins, Pain Hot Pack: Lumbar, 15 mins, Pain and Tone  Manual Therapy Soft Tissue Mobilization: B lumbar paraspinals/QL, to reduce tone and pain    ASSESSMENT:  CLINICAL IMPRESSION: Patient presented in clinic with reports of increased LBP especially while standing. Pain has limited his activity due to increased pain with prolonged standing and is concerned that ultimately that would worsen his MD. Patient presented with moderately increased muscle tightness throughout B lumbar musculature which is where he states that the pain occurs. Patient did not report any soreness or sensitivity during manual therapy. Normal modalities response noted following removal of the modalities.  OBJECTIVE IMPAIRMENTS: decreased activity tolerance, increased muscle spasms, and pain.   ACTIVITY LIMITATIONS: standing and locomotion level  PARTICIPATION LIMITATIONS: cleaning, laundry, and yard work  PERSONAL FACTORS: 1 comorbidity: muscular dystropy  are also affecting patient's functional outcome.   REHAB POTENTIAL: Good  CLINICAL DECISION MAKING: Evolving/moderate complexity  EVALUATION COMPLEXITY: Low  GOALS:  LONG TERM GOALS: Target date: 12/10/2021  Ind with an HEP. Baseline:  Goal status: INITIAL  2.  Stand 20 minutes with pain not > 3/10. Baseline:  Goal status: INITIAL  3.  Walk a community distance with pain not > 3/10. Baseline:  Goal status: INITIAL  PLAN:  PT FREQUENCY: 1-2x/week  PT DURATION: 6 weeks  PLANNED INTERVENTIONS: Therapeutic exercises, Therapeutic activity, Neuromuscular  re-education, Balance training, Gait training, Patient/Family education, Self Care, Joint mobilization, Dry Needling, Electrical stimulation, Cryotherapy, Moist heat, Ultrasound, and Manual therapy.  PLAN FOR NEXT SESSION: Combo e'stim/US,  STW/M, SKTC, core exercise progression.   Standley Brooking, PTA 10/29/2021, 4:05 PM

## 2021-10-31 ENCOUNTER — Other Ambulatory Visit: Payer: Self-pay | Admitting: Family Medicine

## 2021-10-31 ENCOUNTER — Ambulatory Visit: Payer: BC Managed Care – PPO

## 2021-10-31 DIAGNOSIS — M5459 Other low back pain: Secondary | ICD-10-CM | POA: Diagnosis not present

## 2021-10-31 DIAGNOSIS — G7100 Muscular dystrophy, unspecified: Secondary | ICD-10-CM | POA: Diagnosis not present

## 2021-10-31 DIAGNOSIS — M62838 Other muscle spasm: Secondary | ICD-10-CM | POA: Diagnosis not present

## 2021-10-31 NOTE — Therapy (Signed)
OUTPATIENT PHYSICAL THERAPY THORACOLUMBAR TREATMENT   Patient Name: Johnny Mathews MRN: [id number] DOB:[birth year], 61 y.o., male Today's Date: 10/31/2021   PT End of Session - 10/31/21 1520     Visit Number 3    Number of Visits 6    Date for PT Re-Evaluation 12/05/21    Authorization Type FOTO.    PT Start Time 1515    PT Stop Time 1604    PT Time Calculation (min) 49 min    Activity Tolerance Patient tolerated treatment well    Behavior During Therapy WFL for tasks assessed/performed            Past Medical History:  Diagnosis Date   Allergy    Alpha galactosidase deficiency    Anemia    past hx    Arthritis    Asthma    Blood transfusion without reported diagnosis    BPH (benign prostatic hypertrophy)    Cataract    removed left eye    Complication of anesthesia    Diabetes mellitus without complication (HCC)    Dysrhythmia    GERD (gastroesophageal reflux disease)    Heart murmur    History of kidney stones    HOH (hard of hearing)    Hyperlipidemia    Hypertension    Irregular heart beat    Muscular dystrophy (HCC)    PONV (postoperative nausea and vomiting)    Sleep apnea    Wears hearing aid in both ears    Past Surgical History:  Procedure Laterality Date   CATARACT EXTRACTION Left    COLONOSCOPY     ESOPHAGOGASTRODUODENOSCOPY N/A 08/27/2020   Procedure: ESOPHAGOGASTRODUODENOSCOPY (EGD);  Surgeon: Lajuana Matte, MD;  Location: [facility];  Service: Thoracic;  Laterality: N/A;   FEMUR FRACTURE SURGERY     INGUINAL HERNIA REPAIR Right 08/07/2021   Procedure: HERNIA REPAIR INGUINAL ADULT WITH MESH;  Surgeon: Virl Cagey, MD;  Location: [facility];  Service: General;  Laterality: Right;   INSERTION OF MESH N/A 08/27/2020   Procedure: INSERTION OF ACELL 7.5 x 6cm GENTRIX SURGICAL MATRIX HIATAL MESH;  Surgeon: Lajuana Matte, MD;  Location: [facility];  Service: Thoracic;  Laterality: N/A;   KIDNEY STONE SURGERY     x6   KNEE SURGERY Right    x2    MASS EXCISION Right 08/02/2018   Procedure: EXCISION RIGHT LONG FINGER MASS, DEBRIDEMENT PROXIMAL INTERPHALANGEAL JOINT WITH ROTATION FLAP;  Surgeon: Leanora Cover, MD;  Location: [facility];  Service: Orthopedics;  Laterality: Right;   NISSEN FUNDOPLICATION     [facility]    SHOULDER OPEN ROTATOR CUFF REPAIR     XI ROBOTIC ASSISTED HIATAL HERNIA REPAIR N/A 08/27/2020   Procedure: XI ROBOTIC ASSISTED REDO HIATAL HERNIA REPAIR;  Surgeon: Lajuana Matte, MD;  Location: [facility];  Service: Thoracic;  Laterality: N/A;   Patient Active Problem List   Diagnosis Date Noted   Recurrent right inguinal hernia    Hiatal hernia 08/27/2020   BPH (benign prostatic hyperplasia) 11/09/2019   Anginal equivalent 01/18/2019   Chronic heart failure with preserved ejection fraction ([place]) 01/18/2019   Osteoarthritis of finger of right hand 07/21/2018   Hypogonadism, male 03/23/2018   Allergy to meat 06/21/2015   Essential hypertension 02/05/2015   Sciatica of left side 10/02/2014   GERD (gastroesophageal reflux disease) 12/23/2012   Hyperlipidemia with target LDL less than 100 12/23/2012   Type 2 diabetes mellitus without complication, without long-term current use of insulin ([place])  12/23/2012   Asthma, mild intermittent 12/23/2012   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  SUBJECTIVE:                                                                                                                                                                                           SUBJECTIVE STATEMENT: Pt reports he felt good after last treatment session and did not have any pain yesterday.  PERTINENT HISTORY:  Muscular dystrophy, DM, h/o neck pain.  PAIN:  Are you having pain?  Yes: NPRS scale: 5/10 Pain location: Lumbar region. Pain description: Ache, "muscle twitch." Aggravating factors: As above. Relieving factors: As above.  PRECAUTIONS: None  PATIENT GOALS: Not have these muscle spasm and be rid of his low back pain.  OBJECTIVE:   PATIENT SURVEYS:  FOTO Complete.  Today's Tx:                                    EXERCISE LOG  Exercise Repetitions and Resistance Comments  Nustep Lvl 3 x 15 mins        Blank cell = exercise not performed today    Modalities  Date: 10/24 Unattended Estim: Lumbar, IFC 80-150 Hz, 15 mins, Pain and Tone Hot Pack: Lumbar, 15 mins, Pain and Tone  Manual Therapy Soft Tissue Mobilization: B lumbar paraspinals/QL, to reduce tone and pain with pt in right side-lying for comfort    ASSESSMENT:  CLINICAL IMPRESSION: Pt arrives for today's treatment session reporting 5/10 low back pain.  Pt reports that he did not have any pain at all yesterday and really benefited from last treatment session.  Pt able to tolerate Nustep for warm-up today without issue.  STW/M performed to bil lumbar paraspinals with pt in right side-lying to decrease pain and tone.  Normal responses to estim and MH noted.  Pt reported 3/10 low back pain at completion of today's treatment session.   OBJECTIVE IMPAIRMENTS: decreased activity tolerance, increased muscle spasms, and pain.   ACTIVITY LIMITATIONS: standing and locomotion level  PARTICIPATION LIMITATIONS: cleaning, laundry, and yard work  PERSONAL FACTORS: 1 comorbidity: muscular dystropy  are also affecting patient's functional outcome.   REHAB POTENTIAL: Good  CLINICAL DECISION MAKING: Evolving/moderate complexity  EVALUATION COMPLEXITY: Low  GOALS:  LONG TERM GOALS: Target date: 12/12/2021  Ind with an HEP. Baseline:  Goal status: INITIAL  2.  Stand 20 minutes with pain not > 3/10. Baseline:  Goal status: INITIAL  3.  Walk a community distance with pain not >  3/10. Baseline:  Goal status: INITIAL  PLAN:  PT FREQUENCY: 1-2x/week  PT DURATION: 6 weeks  PLANNED INTERVENTIONS: Therapeutic exercises, Therapeutic activity, Neuromuscular re-education, Balance training, Gait training, Patient/Family education, Self Care, Joint mobilization, Dry Needling, Electrical stimulation, Cryotherapy, Moist heat, Ultrasound, and Manual therapy.  PLAN FOR NEXT SESSION: Combo e'stim/US, STW/M, SKTC, core exercise progression.   Kathrynn Ducking, PTA 10/31/2021, 4:10 PM

## 2021-11-01 ENCOUNTER — Ambulatory Visit (HOSPITAL_COMMUNITY)
Admission: RE | Admit: 2021-11-01 | Discharge: 2021-11-01 | Disposition: A | Discharge status: Home / Self Care | Payer: BC Managed Care – PPO | Source: Ambulatory Visit | Attending: Family Medicine | Admitting: Family Medicine

## 2021-11-01 ENCOUNTER — Encounter: Payer: Self-pay | Admitting: Family Medicine

## 2021-11-01 ENCOUNTER — Ambulatory Visit: Payer: BC Managed Care – PPO | Admitting: Family Medicine

## 2021-11-01 VITALS — Temp 98.7°F | Ht 68.0 in | Wt 205.0 lb

## 2021-11-01 DIAGNOSIS — R55 Syncope and collapse: Secondary | ICD-10-CM | POA: Insufficient documentation

## 2021-11-01 DIAGNOSIS — G4709 Other insomnia: Secondary | ICD-10-CM | POA: Diagnosis not present

## 2021-11-01 DIAGNOSIS — R413 Other amnesia: Secondary | ICD-10-CM | POA: Insufficient documentation

## 2021-11-01 DIAGNOSIS — R4702 Dysphasia: Secondary | ICD-10-CM | POA: Diagnosis not present

## 2021-11-01 DIAGNOSIS — R29818 Other symptoms and signs involving the nervous system: Secondary | ICD-10-CM | POA: Diagnosis not present

## 2021-11-01 MED ORDER — FAMOTIDINE 20 MG PO TABS: 20.0000 mg | ORAL_TABLET | Freq: Two times a day (BID) | ORAL | 3 refills | Status: DC

## 2021-11-01 NOTE — Progress Notes (Signed)
Temp 98.7 F (37.1 C)   Ht _0  (1.727 m)   Wt 205 lb (93 kg)   SpO2 99%   BMI 31.17 kg/m    Subjective:   Patient ID: Johnny Mathews, male    DOB: 1960-09-26, 61 y.o.   MRN: [id number]  HPI: Johnny Mathews is a 61 y.o. male presenting on 11/01/2021 for Memory Loss and Loss of Consciousness (Pt states he has this happen about 6 times a day - every time he stands up - states he will feel dizzy at first )   HPI Loss of consciousness and memory Patient is coming in today with complaints of loss of consciousness and memory and passing out.  He says he has been passing out more over the past month.  He says the thing that he can think changes they did increase his metoprolol because of his heart rate.  He says that when he sits and then stands he will get lightheaded and dizzy and feel like he has to pass out and then sometimes he will pass out.  This has been happening almost daily and almost every time he tries to get up from a sitting to a standing.  He says the other big issue that has been having recently has been memory loss.  He says he will very frequent memory loss all the time and he will just be talking and then it is like he cannot get the words out and cannot remember what he saying.  This even happens in front of [place] in the office when he is trying to talk about specific medicines or his neurologist who he sees.  These are things that he usually do but for and he says over the past month this has significantly changed.  Relevant past medical, surgical, family and social history reviewed and updated as indicated. Interim medical history since our last visit reviewed. Allergies and medications reviewed and updated.  Review of Systems  Constitutional:  Negative for chills and fever.  Eyes:  Negative for photophobia and visual disturbance.  Respiratory:  Negative for shortness of breath and wheezing.   Cardiovascular:  Negative for chest pain and leg swelling.  Musculoskeletal:   Negative for back pain and gait problem.  Skin:  Negative for rash.  Neurological:  Positive for dizziness, syncope, speech difficulty and light-headedness. Negative for facial asymmetry, weakness and headaches.  Psychiatric/Behavioral:  Positive for confusion. Negative for decreased concentration, dysphoric mood and sleep disturbance.   All other systems reviewed and are negative.   Per HPI unless specifically indicated above   Allergies as of 11/01/2021       Reactions   Eggs Or Egg-derived Products Swelling   Throat- 03-31-2019 pt states can eat cakes and Pie with no issues  Throat- 03-31-2019 pt states can eat cakes and Pie with no issues    Septra [sulfamethoxazole-trimethoprim] Swelling   throat   Sulfonamide Derivatives Hives        Medication List        Accurate as of November 01, 2021  9:49 AM. If you have any questions, ask your nurse or doctor.          albuterol 108 (90 Base) MCG/ACT inhaler Commonly known as: Ventolin HFA Inhale 2 puffs into the lungs every 4 (four) hours as needed. (Needs to be seen before next refill)   albuterol (2.5 MG/3ML) 0.083% nebulizer solution Commonly known as: PROVENTIL NEBULIZE 1 VIAL EVERY 6 HOURS AS NEEDED FOR WHEEZING &  SHORTNESS OF BREATH   amitriptyline 100 MG tablet Commonly known as: ELAVIL Take 2 tablets (200 mg total) by mouth at bedtime.   azelastine 0.1 % nasal spray Commonly known as: ASTELIN Use in each nostril as directed   Breztri Aerosphere 160-9-4.8 MCG/ACT Aero Generic drug: Budeson-Glycopyrrol-Formoterol USE 2 PUFFS EVERY MORNING AND AT BEDTIME   Contour Blood Glucose System w/Device Kit Test blood sugars four times daily   cyclobenzaprine 10 MG tablet Commonly known as: FLEXERIL Take 1 tablet (10 mg total) by mouth 3 (three) times daily as needed for muscle spasms.   EPINEPHrine 0.3 mg/0.3 mL Soaj injection Commonly known as: EPI-PEN INJECT 0.3ML (0.3MG) IM ONCE   esomeprazole 40 MG  capsule Commonly known as: NEXIUM TAKE 1 CAPSULE DAILY AT NOON What changed:  how much to take how to take this when to take this additional instructions   famotidine 20 MG tablet Commonly known as: PEPCID Take 1 tablet (20 mg total) by mouth 2 (two) times daily. What changed:  how much to take when to take this   ferrous sulfate 325 (65 FE) MG tablet Take 325 mg by mouth daily with breakfast.   finasteride 5 MG tablet Commonly known as: PROSCAR Take one tablet daily   fluticasone 50 MCG/ACT nasal spray Commonly known as: FLONASE USE 1 TO 2 SPRAYS IN EACH NOSTRIL DAILY What changed:  how much to take when to take this   furosemide 20 MG tablet Commonly known as: LASIX Take 1 tablet (20 mg total) by mouth daily as needed. What changed: when to take this   glucose blood test strip Commonly known as: Contour Next Test Test blood sugars four times daily   losartan 50 MG tablet Commonly known as: COZAAR Take 1 tablet (50 mg total) by mouth daily. What changed: when to take this   meloxicam 7.5 MG tablet Commonly known as: MOBIC TAKE ONE (1) TABLET EACH DAY What changed:  how much to take how to take this when to take this additional instructions   metoprolol succinate 50 MG 24 hr tablet Commonly known as: TOPROL-XL Take 50 mg by mouth daily. Take with or immediately following a meal.   montelukast 10 MG tablet Commonly known as: SINGULAIR TAKE ONE TABLET DAILY AT BEDTIME   pravastatin 80 MG tablet Commonly known as: PRAVACHOL Take one tablet daily What changed:  how much to take how to take this when to take this additional instructions   sildenafil 20 MG tablet Commonly known as: REVATIO TAKE 1 TO 3 TABLETS DAILY AS NEEDED   Spacer/Aero-Holding Dorise Bullion Use with inhaler daily   SYRINGE-NEEDLE (DISP) 3 ML 21G X 1-1/2" 3 ML Misc Use to inject testosterone every week   tamsulosin 0.4 MG Caps capsule Commonly known as: FLOMAX Take one  tablet after supper What changed:  how much to take how to take this when to take this additional instructions   testosterone cypionate 100 MG/ML injection Commonly known as: DEPOTESTOTERONE CYPIONATE Inject 1 mL (100 mg total) into the muscle every 14 (fourteen) days. For IM use only   Trulicity 0.09 FG/1.8EX Sopn Generic drug: Dulaglutide Inject 0.75 mg into the skin once a week.         Objective:   Temp 98.7 F (37.1 C)   Ht _0  (1.727 m)   Wt 205 lb (93 kg)   SpO2 99%   BMI 31.17 kg/m   Wt Readings from Last 3 Encounters:  11/01/21 205 lb (93 kg)  10/22/21 207 lb 6.4 oz (94.1 kg)  09/06/21 202 lb (91.6 kg)    Physical Exam Vitals and nursing note reviewed.  Constitutional:      General: He is not in acute distress.    Appearance: He is well-developed. He is not diaphoretic.  Eyes:     General: No scleral icterus.    Extraocular Movements: Extraocular movements intact.     Conjunctiva/sclera: Conjunctivae normal.     Pupils: Pupils are equal, round, and reactive to light.  Neck:     Thyroid: No thyromegaly.  Cardiovascular:     Rate and Rhythm: Normal rate and regular rhythm.     Heart sounds: Normal heart sounds. No murmur heard. Pulmonary:     Effort: Pulmonary effort is normal. No respiratory distress.     Breath sounds: Normal breath sounds. No wheezing.  Musculoskeletal:        General: Normal range of motion.     Cervical back: Neck supple.  Lymphadenopathy:     Cervical: No cervical adenopathy.  Skin:    General: Skin is warm and dry.     Findings: No rash.  Neurological:     General: No focal deficit present.     Mental Status: He is alert. He is confused.     Cranial Nerves: No cranial nerve deficit.     Motor: No weakness.     Coordination: Coordination normal.  Psychiatric:        Behavior: Behavior normal.        Cognition and Memory: Memory is impaired.   Patient has partial memory or partial aphasia on exam while talking about  his history which he usually knows the answer to these questions.    Assessment & Plan:   Problem List Items Addressed This Visit   None Visit Diagnoses     Memory loss    -  Primary   Relevant Orders   MR Brain Wo Contrast   Other insomnia       Relevant Orders   MR Brain Wo Contrast   Syncope and collapse       Relevant Orders   MR Brain Wo Contrast   Dysphasia       Relevant Orders   MR Brain Wo Contrast       With new onset memory disorder over the past month and syncopal episodes, there is concern that there is some intracerebral mass or stroke is a possibility.  We will do MRI brain and blood work today. Follow up plan: Return if symptoms worsen or fail to improve.  Counseling provided for all of the vaccine components Orders Placed This Encounter  Procedures   MR Brain [place] Nariyah Osias, MD [facility] 11/01/2021, 9:49 AM

## 2021-11-02 LAB — CBC WITH DIFFERENTIAL/PLATELET
Basophils Absolute: 0 10*3/uL (ref 0.0–0.2)
Basos: 1 %
EOS (ABSOLUTE): 0.5 10*3/uL — ABNORMAL HIGH (ref 0.0–0.4)
Eos: 8 %
Hematocrit: 35.7 % — ABNORMAL LOW (ref 37.5–51.0)
Hemoglobin: 11.2 g/dL — ABNORMAL LOW (ref 13.0–17.7)
Immature Grans (Abs): 0 10*3/uL (ref 0.0–0.1)
Immature Granulocytes: 0 %
Lymphocytes Absolute: 1.5 10*3/uL (ref 0.7–3.1)
Lymphs: 26 %
MCH: 29.8 pg (ref 26.6–33.0)
MCHC: 31.4 g/dL — ABNORMAL LOW (ref 31.5–35.7)
MCV: 95 fL (ref 79–97)
Monocytes Absolute: 0.6 10*3/uL (ref 0.1–0.9)
Monocytes: 10 %
Neutrophils Absolute: 3.2 10*3/uL (ref 1.4–7.0)
Neutrophils: 55 %
Platelets: 246 10*3/uL (ref 150–450)
RBC: 3.76 x10E6/uL — ABNORMAL LOW (ref 4.14–5.80)
RDW: 12.1 % (ref 11.6–15.4)
WBC: 5.8 10*3/uL (ref 3.4–10.8)

## 2021-11-02 LAB — CMP14+EGFR
ALT: 20 IU/L (ref 0–44)
AST: 25 IU/L (ref 0–40)
Albumin/Globulin Ratio: 2 (ref 1.2–2.2)
Albumin: 4.4 g/dL (ref 3.9–4.9)
Alkaline Phosphatase: 121 IU/L (ref 44–121)
BUN/Creatinine Ratio: 12 (ref 10–24)
BUN: 15 mg/dL (ref 8–27)
Bilirubin Total: 0.3 mg/dL (ref 0.0–1.2)
CO2: 24 mmol/L (ref 20–29)
Calcium: 9.7 mg/dL (ref 8.6–10.2)
Chloride: 102 mmol/L (ref 96–106)
Creatinine, Ser: 1.22 mg/dL (ref 0.76–1.27)
Globulin, Total: 2.2 g/dL (ref 1.5–4.5)
Glucose: 88 mg/dL (ref 70–99)
Potassium: 4.7 mmol/L (ref 3.5–5.2)
Sodium: 140 mmol/L (ref 134–144)
Total Protein: 6.6 g/dL (ref 6.0–8.5)
eGFR: 67 mL/min/{1.73_m2} (ref >59)

## 2021-11-02 LAB — VITAMIN B12: Vitamin B-12: 554 pg/mL (ref 232–1245)

## 2021-11-02 LAB — TSH: TSH: 1.09 u[IU]/mL (ref 0.450–4.500)

## 2021-11-03 ENCOUNTER — Other Ambulatory Visit: Payer: Self-pay | Admitting: Family Medicine

## 2021-11-03 ENCOUNTER — Encounter: Payer: Self-pay | Admitting: Family Medicine

## 2021-11-04 NOTE — Telephone Encounter (Signed)
There are some discrepancies on this 1, it looks like his dose may have been changed to where he takes it twice a day, please verify this before we send it

## 2021-11-05 ENCOUNTER — Encounter: Payer: Self-pay | Admitting: *Deleted

## 2021-11-05 ENCOUNTER — Ambulatory Visit: Payer: BC Managed Care – PPO | Admitting: *Deleted

## 2021-11-05 DIAGNOSIS — M62838 Other muscle spasm: Secondary | ICD-10-CM

## 2021-11-05 DIAGNOSIS — M5459 Other low back pain: Secondary | ICD-10-CM | POA: Diagnosis not present

## 2021-11-05 DIAGNOSIS — G7100 Muscular dystrophy, unspecified: Secondary | ICD-10-CM | POA: Diagnosis not present

## 2021-11-05 NOTE — Telephone Encounter (Signed)
Lmtcb.

## 2021-11-05 NOTE — Therapy (Signed)
OUTPATIENT PHYSICAL THERAPY THORACOLUMBAR TREATMENT   Patient Name: Johnny Mathews MRN: [id number] DOB:09-21-1960, 61 y.o., male Today's Date: 11/05/2021   PT End of Session - 11/05/21 1437     Visit Number 4    Number of Visits 6    Date for PT Re-Evaluation 12/05/21    Authorization Type FOTO.    PT Start Time 1430    PT Stop Time 1520    PT Time Calculation (min) 50 min            Past Medical History:  Diagnosis Date   Allergy    Alpha galactosidase deficiency    Anemia    past hx    Arthritis    Asthma    Blood transfusion without reported diagnosis    BPH (benign prostatic hypertrophy)    Cataract    removed left eye    Complication of anesthesia    Diabetes mellitus without complication (HCC)    Dysrhythmia    GERD (gastroesophageal reflux disease)    Heart murmur    History of kidney stones    HOH (hard of hearing)    Hyperlipidemia    Hypertension    Irregular heart beat    Muscular dystrophy (HCC)    PONV (postoperative nausea and vomiting)    Sleep apnea    Wears hearing aid in both ears    Past Surgical History:  Procedure Laterality Date   CATARACT EXTRACTION Left    COLONOSCOPY     ESOPHAGOGASTRODUODENOSCOPY N/A 08/27/2020   Procedure: ESOPHAGOGASTRODUODENOSCOPY (EGD);  Surgeon: Lajuana Matte, MD;  Location: [place];  Service: Thoracic;  Laterality: N/A;   FEMUR FRACTURE SURGERY     INGUINAL HERNIA REPAIR Right 08/07/2021   Procedure: HERNIA REPAIR INGUINAL ADULT WITH MESH;  Surgeon: Virl Cagey, MD;  Location: [facility];  Service: General;  Laterality: Right;   INSERTION OF MESH N/A 08/27/2020   Procedure: INSERTION OF ACELL 7.5 x 6cm GENTRIX SURGICAL MATRIX HIATAL MESH;  Surgeon: Lajuana Matte, MD;  Location: [place];  Service: Thoracic;  Laterality: N/A;   KIDNEY STONE SURGERY     x6   KNEE SURGERY Right    x2   MASS EXCISION Right 08/02/2018   Procedure: EXCISION RIGHT LONG FINGER MASS, DEBRIDEMENT PROXIMAL INTERPHALANGEAL  JOINT WITH ROTATION FLAP;  Surgeon: Leanora Cover, MD;  Location: [place];  Service: Orthopedics;  Laterality: Right;   NISSEN FUNDOPLICATION     [facility]    SHOULDER OPEN ROTATOR CUFF REPAIR     XI ROBOTIC ASSISTED HIATAL HERNIA REPAIR N/A 08/27/2020   Procedure: XI ROBOTIC ASSISTED REDO HIATAL HERNIA REPAIR;  Surgeon: Lajuana Matte, MD;  Location: [place];  Service: Thoracic;  Laterality: N/A;   Patient Active Problem List   Diagnosis Date Noted   Recurrent right inguinal hernia    Hiatal hernia 08/27/2020   BPH (benign prostatic hyperplasia) 11/09/2019   Anginal equivalent 01/18/2019   Chronic heart failure with preserved ejection fraction ([place]) 01/18/2019   Osteoarthritis of finger of right hand 07/21/2018   Hypogonadism, male 03/23/2018   Allergy to meat 06/21/2015   Essential hypertension 02/05/2015   Sciatica of left side 10/02/2014   GERD (gastroesophageal reflux disease) 12/23/2012   Hyperlipidemia with target LDL less than 100 12/23/2012   Type 2 diabetes mellitus without complication, without long-term current use of insulin ([place]) 12/23/2012   Asthma, mild intermittent 12/23/2012   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  SUBJECTIVE:                                                                                                                                                                                           SUBJECTIVE STATEMENT: Pt reports he felt good after last treatment and sessions are helping.  PERTINENT HISTORY:  Muscular dystrophy, DM, h/o neck pain.  PAIN:  Are you having pain? Yes: NPRS scale: 5/10 Pain location: Lumbar region. Pain description: Ache, "muscle twitch." Aggravating factors: As above. Relieving  factors: As above.  PRECAUTIONS: None  PATIENT GOALS: Not have these muscle spasm and be rid of his low back pain.  OBJECTIVE:   PATIENT SURVEYS:  FOTO Complete.  Today's Tx:                                    EXERCISE LOG     11-05-21  Exercise Repetitions and Resistance Comments  Nustep Lvl 3 x 15 mins        Blank cell = exercise not performed today    Modalities Add combo next RX  Date: 10/24 Unattended Estim: Lumbar, IFC 80-150 Hz, 15 mins, Pain and Tone Hot Pack: Lumbar, 15 mins, Pain and Tone with wedge under LE's  Manual Therapy Soft Tissue Mobilization: B lumbar paraspinals/QL, to reduce tone and pain with pt in right side-lying for comfort    ASSESSMENT:  CLINICAL IMPRESSION: Pt arrives today doing fairly well, but c/o increased pain when standing prolonged times. He had notable tightness/soreness in Bil. Paras during STW. Pt requested to try US/ combo again next session. Decreased pain end of session.  OBJECTIVE IMPAIRMENTS: decreased activity tolerance, increased muscle spasms, and pain.   ACTIVITY LIMITATIONS: standing and locomotion level  PARTICIPATION LIMITATIONS: cleaning, laundry, and yard work  PERSONAL FACTORS: 1 comorbidity: muscular dystropy  are also affecting patient's functional outcome.   REHAB POTENTIAL: Good  CLINICAL DECISION MAKING: Evolving/moderate complexity  EVALUATION COMPLEXITY: Low  GOALS:  LONG TERM GOALS: Target date: 12/12/2021  Ind with an HEP. Baseline:  Goal status: INITIAL  2.  Stand 20 minutes with pain not > 3/10. Baseline:  Goal status: INITIAL  3.  Walk a community distance with pain not > 3/10. Baseline:  Goal status: INITIAL  PLAN:  PT FREQUENCY: 1-2x/week  PT DURATION: 6 weeks  PLANNED INTERVENTIONS: Therapeutic exercises, Therapeutic activity, Neuromuscular re-education, Balance  training, Gait training, Patient/Family education, Self Care, Joint mobilization, Dry Needling, Electrical stimulation,  Cryotherapy, Moist heat, Ultrasound, and Manual therapy.  PLAN FOR NEXT SESSION: Combo e'stim/US, STW/M, SKTC, core exercise progression.   Neyland Pettengill,CHRIS, PTA 11/05/2021, 5:25 PM

## 2021-11-07 ENCOUNTER — Ambulatory Visit: Payer: BC Managed Care – PPO | Attending: Family Medicine | Admitting: *Deleted

## 2021-11-07 ENCOUNTER — Encounter: Payer: Self-pay | Admitting: *Deleted

## 2021-11-07 DIAGNOSIS — M62838 Other muscle spasm: Secondary | ICD-10-CM | POA: Diagnosis not present

## 2021-11-07 DIAGNOSIS — M5459 Other low back pain: Secondary | ICD-10-CM | POA: Insufficient documentation

## 2021-11-07 NOTE — Therapy (Signed)
OUTPATIENT PHYSICAL THERAPY THORACOLUMBAR TREATMENT   Patient Name: Johnny Mathews MRN: [id number] DOB:1960/02/26, 61 y.o., male Today's Date: 11/07/2021   PT End of Session - 11/07/21 1527     Visit Number 5    Number of Visits 6    Date for PT Re-Evaluation 12/05/21    Authorization Type FOTO.    PT Start Time 4967    PT Stop Time 1605    PT Time Calculation (min) 50 min            Past Medical History:  Diagnosis Date   Allergy    Alpha galactosidase deficiency    Anemia    past hx    Arthritis    Asthma    Blood transfusion without reported diagnosis    BPH (benign prostatic hypertrophy)    Cataract    removed left eye    Complication of anesthesia    Diabetes mellitus without complication (HCC)    Dysrhythmia    GERD (gastroesophageal reflux disease)    Heart murmur    History of kidney stones    HOH (hard of hearing)    Hyperlipidemia    Hypertension    Irregular heart beat    Muscular dystrophy ([place])    PONV (postoperative nausea and vomiting)    Sleep apnea    Wears hearing aid in both ears    Past Surgical History:  Procedure Laterality Date   CATARACT EXTRACTION Left    COLONOSCOPY     ESOPHAGOGASTRODUODENOSCOPY N/A 08/27/2020   Procedure: ESOPHAGOGASTRODUODENOSCOPY (EGD);  Surgeon: Lajuana Matte, MD;  Location: [place];  Service: Thoracic;  Laterality: N/A;   FEMUR FRACTURE SURGERY     INGUINAL HERNIA REPAIR Right 08/07/2021   Procedure: HERNIA REPAIR INGUINAL ADULT WITH MESH;  Surgeon: Virl Cagey, MD;  Location: [facility];  Service: General;  Laterality: Right;   INSERTION OF MESH N/A 08/27/2020   Procedure: INSERTION OF ACELL 7.5 x 6cm GENTRIX SURGICAL MATRIX HIATAL MESH;  Surgeon: Lajuana Matte, MD;  Location: [place];  Service: Thoracic;  Laterality: N/A;   KIDNEY STONE SURGERY     x6   KNEE SURGERY Right    x2   MASS EXCISION Right 08/02/2018   Procedure: EXCISION RIGHT LONG FINGER MASS, DEBRIDEMENT PROXIMAL INTERPHALANGEAL  JOINT WITH ROTATION FLAP;  Surgeon: Leanora Cover, MD;  Location: [place];  Service: Orthopedics;  Laterality: Right;   NISSEN FUNDOPLICATION     [facility]    SHOULDER OPEN ROTATOR CUFF REPAIR     XI ROBOTIC ASSISTED HIATAL HERNIA REPAIR N/A 08/27/2020   Procedure: XI ROBOTIC ASSISTED REDO HIATAL HERNIA REPAIR;  Surgeon: Lajuana Matte, MD;  Location: [place];  Service: Thoracic;  Laterality: N/A;   Patient Active Problem List   Diagnosis Date Noted   Recurrent right inguinal hernia    Hiatal hernia 08/27/2020   BPH (benign prostatic hyperplasia) 11/09/2019   Anginal equivalent 01/18/2019   Chronic heart failure with preserved ejection fraction ([place]) 01/18/2019   Osteoarthritis of finger of right hand 07/21/2018   Hypogonadism, male 03/23/2018   Allergy to meat 06/21/2015   Essential hypertension 02/05/2015   Sciatica of left side 10/02/2014   GERD (gastroesophageal reflux disease) 12/23/2012   Hyperlipidemia with target LDL less than 100 12/23/2012   Type 2 diabetes mellitus without complication, without long-term current use of insulin ([place]) 12/23/2012   Asthma, mild intermittent 12/23/2012   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  SUBJECTIVE:                                                                                                                                                                                           SUBJECTIVE STATEMENT: Pt reports he felt good after last treatment and sessions are helping. Less pain when standing now.  PERTINENT HISTORY:  Muscular dystrophy, DM, h/o neck pain.  PAIN:  Are you having pain? Yes: NPRS scale: 5/10 Pain location: Lumbar region. Pain description: Ache, "muscle twitch." Aggravating  factors: As above. Relieving factors: As above.  PRECAUTIONS: None  PATIENT GOALS: Not have these muscle spasm and be rid of his low back pain.  OBJECTIVE:   PATIENT SURVEYS:  FOTO Complete.  Today's Tx:                                    EXERCISE LOG     11-07-21  Exercise Repetitions and Resistance Comments  Nustep Lvl 3 x 15 mins        Blank cell = exercise not performed today    Modalities  US/ combo x 10 mins 1.5 w/cm2  to Bil. LB paras in RT sidelying Date:  Unattended Estim: Lumbar, IFC 80-150 Hz, 15 mins, Pain and Tone Hot Pack: Lumbar, 15 mins, Pain and Tone with wedge under LE's  Manual Therapy Soft Tissue Mobilization:  ,      ASSESSMENT:  CLINICAL IMPRESSION: Pt arrives today doing fairly well, and is having less pain when standing prolonged times.  Pt requested to try US/ combo again today and did well.  No c/o of pain end of session. Progressing towards LTGs with less pain.  OBJECTIVE IMPAIRMENTS: decreased activity tolerance, increased muscle spasms, and pain.   ACTIVITY LIMITATIONS: standing and locomotion level  PARTICIPATION LIMITATIONS: cleaning, laundry, and yard work  PERSONAL FACTORS: 1 comorbidity: muscular dystropy  are also affecting patient's functional outcome.   REHAB POTENTIAL: Good  CLINICAL DECISION MAKING: Evolving/moderate complexity  EVALUATION COMPLEXITY: Low  GOALS:  LONG TERM GOALS: Target date: 12/12/2021  Ind with an HEP. Baseline:  Goal status: Partially MET  2.  Stand 20 minutes with pain not > 3/10. Baseline:  Goal status: Partially met  3.  Walk a community distance with pain not > 3/10. Baseline:  Goal status: Partially met  PLAN:  PT FREQUENCY: 1-2x/week  PT DURATION:  6 weeks  PLANNED INTERVENTIONS: Therapeutic exercises, Therapeutic activity, Neuromuscular re-education, Balance training, Gait training, Patient/Family education, Self Care, Joint mobilization, Dry Needling, Electrical stimulation,  Cryotherapy, Moist heat, Ultrasound, and Manual therapy.  PLAN FOR NEXT SESSION: Combo e'stim/US, STW/M, SKTC, core exercise progression.   Ashanti Littles,CHRIS, PTA 11/07/2021, 4:16 PM

## 2021-11-11 DIAGNOSIS — Z4509 Encounter for adjustment and management of other cardiac device: Secondary | ICD-10-CM | POA: Diagnosis not present

## 2021-11-12 ENCOUNTER — Encounter: Payer: Self-pay | Admitting: Physical Therapy

## 2021-11-12 ENCOUNTER — Ambulatory Visit: Payer: BC Managed Care – PPO | Admitting: Physical Therapy

## 2021-11-12 DIAGNOSIS — M5459 Other low back pain: Secondary | ICD-10-CM

## 2021-11-12 DIAGNOSIS — M62838 Other muscle spasm: Secondary | ICD-10-CM | POA: Diagnosis not present

## 2021-11-12 NOTE — Therapy (Signed)
OUTPATIENT PHYSICAL THERAPY THORACOLUMBAR TREATMENT   Patient Name: Johnny Mathews MRN: [id number] DOB:Aug 02, 1960, 61 y.o., male Today's Date: 11/12/2021   PT End of Session - 11/12/21 1521     Visit Number 6    Number of Visits 6    Date for PT Re-Evaluation 12/05/21    Authorization Type FOTO.    PT Start Time 1521    PT Stop Time 1608    PT Time Calculation (min) 47 min    Activity Tolerance Patient tolerated treatment well    Behavior During Therapy WFL for tasks assessed/performed            Past Medical History:  Diagnosis Date   Allergy    Alpha galactosidase deficiency    Anemia    past hx    Arthritis    Asthma    Blood transfusion without reported diagnosis    BPH (benign prostatic hypertrophy)    Cataract    removed left eye    Complication of anesthesia    Diabetes mellitus without complication (HCC)    Dysrhythmia    GERD (gastroesophageal reflux disease)    Heart murmur    History of kidney stones    HOH (hard of hearing)    Hyperlipidemia    Hypertension    Irregular heart beat    Muscular dystrophy (HCC)    PONV (postoperative nausea and vomiting)    Sleep apnea    Wears hearing aid in both ears    Past Surgical History:  Procedure Laterality Date   CATARACT EXTRACTION Left    COLONOSCOPY     ESOPHAGOGASTRODUODENOSCOPY N/A 08/27/2020   Procedure: ESOPHAGOGASTRODUODENOSCOPY (EGD);  Surgeon: Lajuana Matte, MD;  Location: [facility];  Service: Thoracic;  Laterality: N/A;   FEMUR FRACTURE SURGERY     INGUINAL HERNIA REPAIR Right 08/07/2021   Procedure: HERNIA REPAIR INGUINAL ADULT WITH MESH;  Surgeon: Virl Cagey, MD;  Location: [facility];  Service: General;  Laterality: Right;   INSERTION OF MESH N/A 08/27/2020   Procedure: INSERTION OF ACELL 7.5 x 6cm GENTRIX SURGICAL MATRIX HIATAL MESH;  Surgeon: Lajuana Matte, MD;  Location: [facility];  Service: Thoracic;  Laterality: N/A;   KIDNEY STONE SURGERY     x6   KNEE SURGERY Right    x2    MASS EXCISION Right 08/02/2018   Procedure: EXCISION RIGHT LONG FINGER MASS, DEBRIDEMENT PROXIMAL INTERPHALANGEAL JOINT WITH ROTATION FLAP;  Surgeon: Leanora Cover, MD;  Location: [facility];  Service: Orthopedics;  Laterality: Right;   NISSEN FUNDOPLICATION     [facility]    SHOULDER OPEN ROTATOR CUFF REPAIR     XI ROBOTIC ASSISTED HIATAL HERNIA REPAIR N/A 08/27/2020   Procedure: XI ROBOTIC ASSISTED REDO HIATAL HERNIA REPAIR;  Surgeon: Lajuana Matte, MD;  Location: [facility];  Service: Thoracic;  Laterality: N/A;   Patient Active Problem List   Diagnosis Date Noted   Recurrent right inguinal hernia    Hiatal hernia 08/27/2020   BPH (benign prostatic hyperplasia) 11/09/2019   Anginal equivalent 01/18/2019   Chronic heart failure with preserved ejection fraction ([place]) 01/18/2019   Osteoarthritis of finger of right hand 07/21/2018   Hypogonadism, male 03/23/2018   Allergy to meat 06/21/2015   Essential hypertension 02/05/2015   Sciatica of left side 10/02/2014   GERD (gastroesophageal reflux disease) 12/23/2012   Hyperlipidemia with target LDL less than 100 12/23/2012   Type 2 diabetes mellitus without complication, without long-term current use of insulin ([place])  12/23/2012   Asthma, mild intermittent 12/23/2012   Muscular dystrophy ([place]) 04/29/2012   REFERRING PROVIDER: Vonna Kotyk Dettinger MD  REFERRING DIAG: Muscular Dystrophy  Rationale for Evaluation and Treatment Rehabilitation  THERAPY DIAG:  Other low back pain  Other muscle spasm  ONSET DATE: ~6 months ago.  SUBJECTIVE:                                                                                                                                                                                           SUBJECTIVE STATEMENT: Reports more of an annoyance in lower back and hip area. Reports that he has noticed some improvement.  PERTINENT HISTORY:  Muscular dystrophy, DM, h/o neck pain.  PAIN:  Are you  having pain? Yes: NPRS scale: no score provided/10 Pain location: Lumbar region. Pain description: Annoyance Aggravating factors: As above. Relieving factors: As above.  PRECAUTIONS: None  PATIENT GOALS: Not have these muscle spasm and be rid of his low back pain.  OBJECTIVE:   PATIENT SURVEYS:  FOTO Complete.  Today's Tx:                                    EXERCISE LOG     11-12-21  Exercise Repetitions and Resistance Comments  Nustep Lvl 3 x 13 mins        Blank cell = exercise not performed today  Modalities  Date:  Unattended Estim: Lumbar, IFC, 15 mins, Pain Combo: Lumbar, 1.5 w/cm2, 100%, 10 mins, Pain  Manual Therapy Soft Tissue Mobilization: B lumbar paraspinals, QL, to reduce discomfort    ASSESSMENT:  CLINICAL IMPRESSION: Patient presented in clinic with discomfort that he states is more of an annoyance now. Patient can tell an improvement overall that he can tolerate being more active. Mild to moderate muscle tone palpable in inferior lumbar paraspinals region. Normal modalities response noted following removal of the modalities.  OBJECTIVE IMPAIRMENTS: decreased activity tolerance, increased muscle spasms, and pain.   ACTIVITY LIMITATIONS: standing and locomotion level  PARTICIPATION LIMITATIONS: cleaning, laundry, and yard work  PERSONAL FACTORS: 1 comorbidity: muscular dystropy  are also affecting patient's functional outcome.   REHAB POTENTIAL: Good  CLINICAL DECISION MAKING: Evolving/moderate complexity  EVALUATION COMPLEXITY: Low  GOALS:  LONG TERM GOALS: Target date: 12/12/2021  Ind with an HEP. Baseline:  Goal status: Partially MET  2.  Stand 20 minutes with pain not > 3/10. Baseline:  Goal status: Partially met  3.  Walk a community distance with pain not > 3/10. Baseline:  Goal status: Partially met  PLAN:  PT  FREQUENCY: 1-2x/week  PT DURATION: 6 weeks  PLANNED INTERVENTIONS: Therapeutic exercises, Therapeutic activity,  Neuromuscular re-education, Balance training, Gait training, Patient/Family education, Self Care, Joint mobilization, Dry Needling, Electrical stimulation, Cryotherapy, Moist heat, Ultrasound, and Manual therapy.  PLAN FOR NEXT SESSION: Combo e'stim/US, STW/M, SKTC, core exercise progression.   Standley Brooking, PTA 11/12/2021, 4:12 PM

## 2021-11-14 ENCOUNTER — Encounter: Payer: Self-pay | Admitting: *Deleted

## 2021-11-14 ENCOUNTER — Ambulatory Visit: Payer: BC Managed Care – PPO | Admitting: *Deleted

## 2021-11-14 DIAGNOSIS — M5459 Other low back pain: Secondary | ICD-10-CM | POA: Diagnosis not present

## 2021-11-14 DIAGNOSIS — M62838 Other muscle spasm: Secondary | ICD-10-CM | POA: Diagnosis not present

## 2021-11-14 NOTE — Therapy (Signed)
OUTPATIENT PHYSICAL THERAPY THORACOLUMBAR TREATMENT   Patient Name: Johnny Mathews MRN: [id number] DOB:1961/01/04, 61 y.o., male Today's Date: 11/14/2021   PT End of Session - 11/14/21 1524     Visit Number 7    Number of Visits 10    Date for PT Re-Evaluation 12/05/21    Authorization Type FOTO.    PT Start Time 4132    PT Stop Time 4401    PT Time Calculation (min) 50 min            Past Medical History:  Diagnosis Date   Allergy    Alpha galactosidase deficiency    Anemia    past hx    Arthritis    Asthma    Blood transfusion without reported diagnosis    BPH (benign prostatic hypertrophy)    Cataract    removed left eye    Complication of anesthesia    Diabetes mellitus without complication (HCC)    Dysrhythmia    GERD (gastroesophageal reflux disease)    Heart murmur    History of kidney stones    HOH (hard of hearing)    Hyperlipidemia    Hypertension    Irregular heart beat    Muscular dystrophy (HCC)    PONV (postoperative nausea and vomiting)    Sleep apnea    Wears hearing aid in both ears    Past Surgical History:  Procedure Laterality Date   CATARACT EXTRACTION Left    COLONOSCOPY     ESOPHAGOGASTRODUODENOSCOPY N/A 08/27/2020   Procedure: ESOPHAGOGASTRODUODENOSCOPY (EGD);  Surgeon: Lajuana Matte, MD;  Location: [facility];  Service: Thoracic;  Laterality: N/A;   FEMUR FRACTURE SURGERY     INGUINAL HERNIA REPAIR Right 08/07/2021   Procedure: HERNIA REPAIR INGUINAL ADULT WITH MESH;  Surgeon: Virl Cagey, MD;  Location: [facility];  Service: General;  Laterality: Right;   INSERTION OF MESH N/A 08/27/2020   Procedure: INSERTION OF ACELL 7.5 x 6cm GENTRIX SURGICAL MATRIX HIATAL MESH;  Surgeon: Lajuana Matte, MD;  Location: [facility];  Service: Thoracic;  Laterality: N/A;   KIDNEY STONE SURGERY     x6   KNEE SURGERY Right    x2   MASS EXCISION Right 08/02/2018   Procedure: EXCISION RIGHT LONG FINGER MASS, DEBRIDEMENT PROXIMAL INTERPHALANGEAL  JOINT WITH ROTATION FLAP;  Surgeon: Leanora Cover, MD;  Location: [facility];  Service: Orthopedics;  Laterality: Right;   NISSEN FUNDOPLICATION     [facility]    SHOULDER OPEN ROTATOR CUFF REPAIR     XI ROBOTIC ASSISTED HIATAL HERNIA REPAIR N/A 08/27/2020   Procedure: XI ROBOTIC ASSISTED REDO HIATAL HERNIA REPAIR;  Surgeon: Lajuana Matte, MD;  Location: [facility];  Service: Thoracic;  Laterality: N/A;   Patient Active Problem List   Diagnosis Date Noted   Recurrent right inguinal hernia    Hiatal hernia 08/27/2020   BPH (benign prostatic hyperplasia) 11/09/2019   Anginal equivalent 01/18/2019   Chronic heart failure with preserved ejection fraction ([place]) 01/18/2019   Osteoarthritis of finger of right hand 07/21/2018   Hypogonadism, male 03/23/2018   Allergy to meat 06/21/2015   Essential hypertension 02/05/2015   Sciatica of left side 10/02/2014   GERD (gastroesophageal reflux disease) 12/23/2012   Hyperlipidemia with target LDL less than 100 12/23/2012   Type 2 diabetes mellitus without complication, without long-term current use of insulin ([place]) 12/23/2012   Asthma, mild intermittent 12/23/2012   Muscular dystrophy ([place]) 04/29/2012   REFERRING PROVIDER: Vonna Kotyk Dettinger  MD  REFERRING DIAG: Muscular Dystrophy  Rationale for Evaluation and Treatment Rehabilitation  THERAPY DIAG:  Other low back pain - Plan: PT plan of care cert/re-cert  ONSET DATE: ~6 months ago.  SUBJECTIVE:                                                                                                                                                                                           SUBJECTIVE STATEMENT: Reports having a good day today and feel 60-70% better and would like to cont. PT a few more weeks.  PERTINENT HISTORY:  Muscular dystrophy, DM, h/o neck pain.  PAIN:  Are you having pain? Yes: NPRS scale: 2-3/10 Pain location: Lumbar region. Pain description:  Annoyance Aggravating factors: As above. Relieving factors: As above.  PRECAUTIONS: None  PATIENT GOALS: Not have these muscle spasm and be rid of his low back pain.  OBJECTIVE:   PATIENT SURVEYS:  FOTO Complete.  Today's Tx:                                    EXERCISE LOG     11-12-21  Exercise Repetitions and Resistance Comments  Nustep Lvl 3 x 15 mins        Blank cell = exercise not performed today  Modalities  Date:  Unattended Estim: Lumbar, IFC 80-_0  , 15 mins, Pain Combo: Lumbar, 1.5 w/cm2, 100%, 12 mins, Pain  Manual Therapy    ASSESSMENT:  CLINICAL IMPRESSION:Pt arrived today doing fairly well and reports 60-70% better and would like to continue with PT a few more weeks to meet all LTG's. Rx focused on exercise as well as modalities for LBP. Majority of LTG's partially met and Pt continues to progress.           OBJECTIVE IMPAIRMENTS: decreased activity tolerance, increased muscle spasms, and pain.   ACTIVITY LIMITATIONS: standing and locomotion level  PARTICIPATION LIMITATIONS: cleaning, laundry, and yard work  PERSONAL FACTORS: 1 comorbidity: muscular dystropy  are also affecting patient's functional outcome.   REHAB POTENTIAL: Good  CLINICAL DECISION MAKING: Evolving/moderate complexity  EVALUATION COMPLEXITY: Low  GOALS:  LONG TERM GOALS: Target date: 12/12/2021  Ind with an HEP. Baseline:  Goal status: Partially MET  2.  Stand 20 minutes with pain not > 3/10. Baseline:  Goal status: Partially met  3.  Walk a community distance with pain not > 3/10. Baseline:  Goal status: Partially met  PLAN:  PT FREQUENCY: 1-2x/week  PT DURATION: 6 weeks  PLANNED INTERVENTIONS: Therapeutic exercises, Therapeutic activity, Neuromuscular re-education, Balance training, Gait  training, Patient/Family education, Self Care, Joint mobilization, Dry Needling, Electrical stimulation, Cryotherapy, Moist heat, Ultrasound, and Manual therapy.  PLAN  FOR NEXT SESSION: Combo e'stim/US, STW/M, SKTC, core exercise progression. Recert for more visits   Varetta Chavers,CHRIS, PTA 11/14/2021, 4:07 PM

## 2021-11-19 ENCOUNTER — Ambulatory Visit: Payer: BC Managed Care – PPO | Admitting: *Deleted

## 2021-11-19 ENCOUNTER — Encounter: Payer: Self-pay | Admitting: *Deleted

## 2021-11-19 DIAGNOSIS — M5459 Other low back pain: Secondary | ICD-10-CM | POA: Diagnosis not present

## 2021-11-19 DIAGNOSIS — M62838 Other muscle spasm: Secondary | ICD-10-CM

## 2021-11-19 NOTE — Therapy (Signed)
OUTPATIENT PHYSICAL THERAPY THORACOLUMBAR TREATMENT   Patient Name: Johnny Mathews MRN: [id number] DOB:Apr 29, 1960, 61 y.o., male Today's Date: 11/19/2021   PT End of Session - 11/19/21 1522     Visit Number 8    Number of Visits 10    Date for PT Re-Evaluation 12/05/21    Authorization Type FOTO.    PT Start Time 9381    PT Stop Time 1605    PT Time Calculation (min) 48 min            Past Medical History:  Diagnosis Date   Allergy    Alpha galactosidase deficiency    Anemia    past hx    Arthritis    Asthma    Blood transfusion without reported diagnosis    BPH (benign prostatic hypertrophy)    Cataract    removed left eye    Complication of anesthesia    Diabetes mellitus without complication (HCC)    Dysrhythmia    GERD (gastroesophageal reflux disease)    Heart murmur    History of kidney stones    HOH (hard of hearing)    Hyperlipidemia    Hypertension    Irregular heart beat    Muscular dystrophy ([place])    PONV (postoperative nausea and vomiting)    Sleep apnea    Wears hearing aid in both ears    Past Surgical History:  Procedure Laterality Date   CATARACT EXTRACTION Left    COLONOSCOPY     ESOPHAGOGASTRODUODENOSCOPY N/A 08/27/2020   Procedure: ESOPHAGOGASTRODUODENOSCOPY (EGD);  Surgeon: Lajuana Matte, MD;  Location: [facility];  Service: Thoracic;  Laterality: N/A;   FEMUR FRACTURE SURGERY     INGUINAL HERNIA REPAIR Right 08/07/2021   Procedure: HERNIA REPAIR INGUINAL ADULT WITH MESH;  Surgeon: Virl Cagey, MD;  Location: [facility];  Service: General;  Laterality: Right;   INSERTION OF MESH N/A 08/27/2020   Procedure: INSERTION OF ACELL 7.5 x 6cm GENTRIX SURGICAL MATRIX HIATAL MESH;  Surgeon: Lajuana Matte, MD;  Location: [facility];  Service: Thoracic;  Laterality: N/A;   KIDNEY STONE SURGERY     x6   KNEE SURGERY Right    x2   MASS EXCISION Right 08/02/2018   Procedure: EXCISION RIGHT LONG FINGER MASS, DEBRIDEMENT PROXIMAL INTERPHALANGEAL  JOINT WITH ROTATION FLAP;  Surgeon: Leanora Cover, MD;  Location: [facility];  Service: Orthopedics;  Laterality: Right;   NISSEN FUNDOPLICATION     [facility]    SHOULDER OPEN ROTATOR CUFF REPAIR     XI ROBOTIC ASSISTED HIATAL HERNIA REPAIR N/A 08/27/2020   Procedure: XI ROBOTIC ASSISTED REDO HIATAL HERNIA REPAIR;  Surgeon: Lajuana Matte, MD;  Location: [facility];  Service: Thoracic;  Laterality: N/A;   Patient Active Problem List   Diagnosis Date Noted   Recurrent right inguinal hernia    Hiatal hernia 08/27/2020   BPH (benign prostatic hyperplasia) 11/09/2019   Anginal equivalent 01/18/2019   Chronic heart failure with preserved ejection fraction ([place]) 01/18/2019   Osteoarthritis of finger of right hand 07/21/2018   Hypogonadism, male 03/23/2018   Allergy to meat 06/21/2015   Essential hypertension 02/05/2015   Sciatica of left side 10/02/2014   GERD (gastroesophageal reflux disease) 12/23/2012   Hyperlipidemia with target LDL less than 100 12/23/2012   Type 2 diabetes mellitus without complication, without long-term current use of insulin ([place]) 12/23/2012   Asthma, mild intermittent 12/23/2012   Muscular dystrophy ([place]) 04/29/2012   REFERRING PROVIDER: Vonna Kotyk Dettinger  MD  REFERRING DIAG: Muscular Dystrophy  Rationale for Evaluation and Treatment Rehabilitation  THERAPY DIAG:  Other low back pain  Other muscle spasm  ONSET DATE: ~6 months ago.  SUBJECTIVE:                                                                                                                                                                                           SUBJECTIVE STATEMENT: PT Reports having to lift a lot this weekend and his back was hurting, but was okay the next day  PERTINENT HISTORY:  Muscular dystrophy, DM, h/o neck pain.  PAIN:  Are you having pain? Yes: NPRS scale: 2-3/10 Pain location: Lumbar region. Pain description: Annoyance Aggravating factors: As  above. Relieving factors: As above.  PRECAUTIONS: None  PATIENT GOALS: Not have these muscle spasm and be rid of his low back pain.  OBJECTIVE:   PATIENT SURVEYS:  FOTO Complete.  Today's Tx:                                    EXERCISE LOG     11-19-21  Exercise Repetitions and Resistance Comments  Nustep Lvl 3 x 15 mins        Blank cell = exercise not performed today  Modalities  Date:  Unattended Estim: Lumbar, IFC 80-_0  , 15 mins, Pain Combo: Lumbar, 1.5 w/cm2, 100%, 12 mins, Pain in RT sidelying  Manual Therapy    ASSESSMENT:  CLINICAL IMPRESSION: Pt arrived today doing fairly well after doing some lifting this weekend and having increased pain, but resolved the next day. He continues to progress with decreased back pain with ADL's and act's .         OBJECTIVE IMPAIRMENTS: decreased activity tolerance, increased muscle spasms, and pain.   ACTIVITY LIMITATIONS: standing and locomotion level  PARTICIPATION LIMITATIONS: cleaning, laundry, and yard work  PERSONAL FACTORS: 1 comorbidity: muscular dystropy  are also affecting patient's functional outcome.   REHAB POTENTIAL: Good  CLINICAL DECISION MAKING: Evolving/moderate complexity  EVALUATION COMPLEXITY: Low  GOALS:  LONG TERM GOALS: Target date: 12/12/2021  Ind with an HEP. Baseline:  Goal status: Partially MET  2.  Stand 20 minutes with pain not > 3/10. Baseline:  Goal status: Partially met  3.  Walk a community distance with pain not > 3/10. Baseline:  Goal status: Partially met  PLAN:  PT FREQUENCY: 1-2x/week  PT DURATION: 6 weeks  PLANNED INTERVENTIONS: Therapeutic exercises, Therapeutic activity, Neuromuscular re-education, Balance training, Gait training, Patient/Family education, Self Care, Joint mobilization, Dry Needling, Electrical stimulation, Cryotherapy,  Moist heat, Ultrasound, and Manual therapy.  PLAN FOR NEXT SESSION: Combo e'stim/US, STW/M, SKTC, core exercise  progression. Recert for more visits   Gabriel Paulding,CHRIS, PTA 11/19/2021, 5:18 PM

## 2021-11-21 ENCOUNTER — Encounter: Payer: Self-pay | Admitting: Physical Therapy

## 2021-11-21 ENCOUNTER — Ambulatory Visit: Payer: BC Managed Care – PPO | Admitting: Physical Therapy

## 2021-11-21 DIAGNOSIS — M5459 Other low back pain: Secondary | ICD-10-CM | POA: Diagnosis not present

## 2021-11-21 DIAGNOSIS — M62838 Other muscle spasm: Secondary | ICD-10-CM

## 2021-11-21 NOTE — Therapy (Signed)
OUTPATIENT PHYSICAL THERAPY THORACOLUMBAR TREATMENT   Patient Name: Johnny Mathews MRN: [id number] DOB:29-Sep-1960, 61 y.o., male Today's Date: 11/21/2021   PT End of Session - 11/21/21 1520     Visit Number 9    Number of Visits 10    Date for PT Re-Evaluation 12/05/21    Authorization Type FOTO.    PT Start Time 1516    PT Stop Time 1603    PT Time Calculation (min) 47 min    Activity Tolerance Patient tolerated treatment well    Behavior During Therapy WFL for tasks assessed/performed            Past Medical History:  Diagnosis Date   Allergy    Alpha galactosidase deficiency    Anemia    past hx    Arthritis    Asthma    Blood transfusion without reported diagnosis    BPH (benign prostatic hypertrophy)    Cataract    removed left eye    Complication of anesthesia    Diabetes mellitus without complication (HCC)    Dysrhythmia    GERD (gastroesophageal reflux disease)    Heart murmur    History of kidney stones    HOH (hard of hearing)    Hyperlipidemia    Hypertension    Irregular heart beat    Muscular dystrophy (HCC)    PONV (postoperative nausea and vomiting)    Sleep apnea    Wears hearing aid in both ears    Past Surgical History:  Procedure Laterality Date   CATARACT EXTRACTION Left    COLONOSCOPY     ESOPHAGOGASTRODUODENOSCOPY N/A 08/27/2020   Procedure: ESOPHAGOGASTRODUODENOSCOPY (EGD);  Surgeon: Lajuana Matte, MD;  Location: [facility];  Service: Thoracic;  Laterality: N/A;   FEMUR FRACTURE SURGERY     INGUINAL HERNIA REPAIR Right 08/07/2021   Procedure: HERNIA REPAIR INGUINAL ADULT WITH MESH;  Surgeon: Virl Cagey, MD;  Location: [facility];  Service: General;  Laterality: Right;   INSERTION OF MESH N/A 08/27/2020   Procedure: INSERTION OF ACELL 7.5 x 6cm GENTRIX SURGICAL MATRIX HIATAL MESH;  Surgeon: Lajuana Matte, MD;  Location: [facility];  Service: Thoracic;  Laterality: N/A;   KIDNEY STONE SURGERY     x6   KNEE SURGERY Right    x2    MASS EXCISION Right 08/02/2018   Procedure: EXCISION RIGHT LONG FINGER MASS, DEBRIDEMENT PROXIMAL INTERPHALANGEAL JOINT WITH ROTATION FLAP;  Surgeon: Leanora Cover, MD;  Location: [facility];  Service: Orthopedics;  Laterality: Right;   NISSEN FUNDOPLICATION     [facility]    SHOULDER OPEN ROTATOR CUFF REPAIR     XI ROBOTIC ASSISTED HIATAL HERNIA REPAIR N/A 08/27/2020   Procedure: XI ROBOTIC ASSISTED REDO HIATAL HERNIA REPAIR;  Surgeon: Lajuana Matte, MD;  Location: [facility];  Service: Thoracic;  Laterality: N/A;   Patient Active Problem List   Diagnosis Date Noted   Recurrent right inguinal hernia    Hiatal hernia 08/27/2020   BPH (benign prostatic hyperplasia) 11/09/2019   Anginal equivalent 01/18/2019   Chronic heart failure with preserved ejection fraction ([place]) 01/18/2019   Osteoarthritis of finger of right hand 07/21/2018   Hypogonadism, male 03/23/2018   Allergy to meat 06/21/2015   Essential hypertension 02/05/2015   Sciatica of left side 10/02/2014   GERD (gastroesophageal reflux disease) 12/23/2012   Hyperlipidemia with target LDL less than 100 12/23/2012   Type 2 diabetes mellitus without complication, without long-term current use of insulin ([place])  12/23/2012   Asthma, mild intermittent 12/23/2012   Muscular dystrophy ([place]) 04/29/2012   REFERRING PROVIDER: Vonna Kotyk Dettinger MD  REFERRING DIAG: Muscular Dystrophy  Rationale for Evaluation and Treatment Rehabilitation  THERAPY DIAG:  Other low back pain  Other muscle spasm  ONSET DATE: ~6 months ago.  SUBJECTIVE:                                                                                                                                                                                           SUBJECTIVE STATEMENT: Has been able to return to walking 3 miles per day. Therapy has really helped.  PERTINENT HISTORY:  Muscular dystrophy, DM, h/o neck pain.  PAIN:  Are you having pain?  No.  PRECAUTIONS: None  PATIENT GOALS: Not have these muscle spasm and be rid of his low back pain.  OBJECTIVE:   PATIENT SURVEYS:  FOTO Complete.  Today's Tx:                                    EXERCISE LOG     11-21-21  Exercise Repetitions and Resistance Comments  Nustep Lvl 3 x 16 mins        Blank cell = exercise not performed today   Modalities  Date: 11/16 Unattended Estim: Lumbar, IFC 80-_0  , 15 mins, Pain Combo: Lumbar, 1.5 w/cm2, 100%, 10 mins, Pain in RT sidelying  ASSESSMENT:  CLINICAL IMPRESSION: Patient presented in clinic with reports of improvement in lumbar symptoms as he has been able to return to recreational walking up to three miles. Mildly increased tone palpable in L lumbar paraspinals and superior glute today. Normal modalities response noted following removal of the modalities.  OBJECTIVE IMPAIRMENTS: decreased activity tolerance, increased muscle spasms, and pain.   ACTIVITY LIMITATIONS: standing and locomotion level  PARTICIPATION LIMITATIONS: cleaning, laundry, and yard work  PERSONAL FACTORS: 1 comorbidity: muscular dystropy  are also affecting patient's functional outcome.   REHAB POTENTIAL: Good  CLINICAL DECISION MAKING: Evolving/moderate complexity  EVALUATION COMPLEXITY: Low  GOALS:  LONG TERM GOALS: Target date: 12/12/2021  Ind with an HEP. Baseline:  Goal status: Partially MET  2.  Stand 20 minutes with pain not > 3/10. Baseline:  Goal status: Partially met  3.  Walk a community distance with pain not > 3/10. Baseline:  Goal status: Partially met  PLAN:  PT FREQUENCY: 1-2x/week  PT DURATION: 6 weeks  PLANNED INTERVENTIONS: Therapeutic exercises, Therapeutic activity, Neuromuscular re-education, Balance training, Gait training, Patient/Family education, Self Care, Joint mobilization, Dry Needling, Electrical stimulation, Cryotherapy, Moist heat, Ultrasound,  and Manual therapy.  PLAN FOR NEXT SESSION: Combo  e'stim/US, STW/M, SKTC, core exercise progression. Recert for more visits   Standley Brooking, PTA 11/21/2021, 4:05 PM

## 2021-11-22 ENCOUNTER — Other Ambulatory Visit: Payer: Self-pay | Admitting: Family Medicine

## 2021-11-22 DIAGNOSIS — R0602 Shortness of breath: Secondary | ICD-10-CM

## 2021-11-22 DIAGNOSIS — U071 COVID-19: Secondary | ICD-10-CM

## 2021-11-22 DIAGNOSIS — J4520 Mild intermittent asthma, uncomplicated: Secondary | ICD-10-CM

## 2021-11-26 ENCOUNTER — Encounter: Payer: Self-pay | Admitting: *Deleted

## 2021-11-26 ENCOUNTER — Encounter: Payer: Self-pay | Admitting: Family Medicine

## 2021-11-26 ENCOUNTER — Ambulatory Visit: Payer: BC Managed Care – PPO | Admitting: *Deleted

## 2021-11-26 DIAGNOSIS — M5459 Other low back pain: Secondary | ICD-10-CM | POA: Diagnosis not present

## 2021-11-26 DIAGNOSIS — M62838 Other muscle spasm: Secondary | ICD-10-CM

## 2021-11-26 NOTE — Therapy (Signed)
OUTPATIENT PHYSICAL THERAPY THORACOLUMBAR TREATMENT   Patient Name: Johnny Mathews MRN: [id number] DOB:1961-01-05, 61 y.o., male Today's Date: 11/26/2021   PT End of Session - 11/26/21 1514     Visit Number 10    Number of Visits 10    Date for PT Re-Evaluation 12/05/21    Authorization Type FOTO.    PT Start Time 3382    PT Stop Time 1605    PT Time Calculation (min) 50 min            Past Medical History:  Diagnosis Date   Allergy    Alpha galactosidase deficiency    Anemia    past hx    Arthritis    Asthma    Blood transfusion without reported diagnosis    BPH (benign prostatic hypertrophy)    Cataract    removed left eye    Complication of anesthesia    Diabetes mellitus without complication (HCC)    Dysrhythmia    GERD (gastroesophageal reflux disease)    Heart murmur    History of kidney stones    HOH (hard of hearing)    Hyperlipidemia    Hypertension    Irregular heart beat    Muscular dystrophy ([place])    PONV (postoperative nausea and vomiting)    Sleep apnea    Wears hearing aid in both ears    Past Surgical History:  Procedure Laterality Date   CATARACT EXTRACTION Left    COLONOSCOPY     ESOPHAGOGASTRODUODENOSCOPY N/A 08/27/2020   Procedure: ESOPHAGOGASTRODUODENOSCOPY (EGD);  Surgeon: Lajuana Matte, MD;  Location: [facility];  Service: Thoracic;  Laterality: N/A;   FEMUR FRACTURE SURGERY     INGUINAL HERNIA REPAIR Right 08/07/2021   Procedure: HERNIA REPAIR INGUINAL ADULT WITH MESH;  Surgeon: Virl Cagey, MD;  Location: [facility];  Service: General;  Laterality: Right;   INSERTION OF MESH N/A 08/27/2020   Procedure: INSERTION OF ACELL 7.5 x 6cm GENTRIX SURGICAL MATRIX HIATAL MESH;  Surgeon: Lajuana Matte, MD;  Location: [facility];  Service: Thoracic;  Laterality: N/A;   KIDNEY STONE SURGERY     x6   KNEE SURGERY Right    x2   MASS EXCISION Right 08/02/2018   Procedure: EXCISION RIGHT LONG FINGER MASS, DEBRIDEMENT PROXIMAL INTERPHALANGEAL  JOINT WITH ROTATION FLAP;  Surgeon: Leanora Cover, MD;  Location: [facility];  Service: Orthopedics;  Laterality: Right;   NISSEN FUNDOPLICATION     [facility]    SHOULDER OPEN ROTATOR CUFF REPAIR     XI ROBOTIC ASSISTED HIATAL HERNIA REPAIR N/A 08/27/2020   Procedure: XI ROBOTIC ASSISTED REDO HIATAL HERNIA REPAIR;  Surgeon: Lajuana Matte, MD;  Location: [facility];  Service: Thoracic;  Laterality: N/A;   Patient Active Problem List   Diagnosis Date Noted   Recurrent right inguinal hernia    Hiatal hernia 08/27/2020   BPH (benign prostatic hyperplasia) 11/09/2019   Anginal equivalent 01/18/2019   Chronic heart failure with preserved ejection fraction ([place]) 01/18/2019   Osteoarthritis of finger of right hand 07/21/2018   Hypogonadism, male 03/23/2018   Allergy to meat 06/21/2015   Essential hypertension 02/05/2015   Sciatica of left side 10/02/2014   GERD (gastroesophageal reflux disease) 12/23/2012   Hyperlipidemia with target LDL less than 100 12/23/2012   Type 2 diabetes mellitus without complication, without long-term current use of insulin ([place]) 12/23/2012   Asthma, mild intermittent 12/23/2012   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  SUBJECTIVE:                                                                                                                                                                                           SUBJECTIVE STATEMENT: Has been able to return to walking 3 miles per day. Therapy has really helped.  PERTINENT HISTORY:  Muscular dystrophy, DM, h/o neck pain.  PAIN:  Are you having pain? No.  PRECAUTIONS: None  PATIENT GOALS: Not have these muscle spasm and be rid of his low back pain.  OBJECTIVE:   PATIENT  SURVEYS:  FOTO Complete.  Today's Tx:                                    EXERCISE LOG     11-26-21  Exercise Repetitions and Resistance Comments  Nustep Lvl 3 x 15 mins        Blank cell = exercise not performed today   Modalities  Date: 11/16 Unattended Estim: Lumbar, IFC 80-_0  , 15 mins, Pain Combo: Lumbar, 1.5 w/cm2, 100%, 12 mins, Pain in RT sidelying  ASSESSMENT:  CLINICAL IMPRESSION: FOTO complete. Patient presented in clinic today reporting doing ver well and ready to DC from PT. Normal modalities response noted following removal of the modalities. All LTG's met. Pt very happy with current status  OBJECTIVE IMPAIRMENTS: decreased activity tolerance, increased muscle spasms, and pain.   ACTIVITY LIMITATIONS: standing and locomotion level  PARTICIPATION LIMITATIONS: cleaning, laundry, and yard work  PERSONAL FACTORS: 1 comorbidity: muscular dystropy  are also affecting patient's functional outcome.   REHAB POTENTIAL: Good  CLINICAL DECISION MAKING: Evolving/moderate complexity  EVALUATION COMPLEXITY: Low  GOALS:  LONG TERM GOALS: Target date: 12/12/2021  Ind with an HEP. Baseline:  Goal status:  MET  2.  Stand 20 minutes with pain not > 3/10. Baseline:  Goal status: MET  3.  Walk a community distance with pain not > 3/10. Baseline:  Goal status: Met  PLAN:  PT FREQUENCY: 1-2x/week  PT DURATION: 6 weeks  PLANNED INTERVENTIONS: Therapeutic exercises, Therapeutic activity, Neuromuscular re-education, Balance training, Gait training, Patient/Family education, Self Care, Joint mobilization, Dry Needling, Electrical stimulation, Cryotherapy, Moist heat, Ultrasound, and Manual therapy.  PLAN FOR NEXT SESSION: DC to HEP   Loc Feinstein,CHRIS, PTA 11/26/2021, 4:53 PM   PHYSICAL THERAPY DISCHARGE SUMMARY  Visits from Start of Care: 10.  Current functional level related to goals / functional outcomes: See  above.   Remaining deficits: All goals met.    Education / Equipment: HEP.   Patient agrees to discharge. Patient goals were met. Patient is being discharged due to meeting the stated rehab goals.    Mali Applegate MPT

## 2021-11-27 ENCOUNTER — Encounter: Payer: Self-pay | Admitting: Family Medicine

## 2021-11-27 ENCOUNTER — Other Ambulatory Visit: Payer: Self-pay

## 2021-11-27 MED ORDER — BLOOD GLUCOSE METER KIT: PACK | 0 refills | Status: AC

## 2021-11-27 MED ORDER — TADALAFIL 10 MG PO TABS: 10.0000 mg | ORAL_TABLET | ORAL | 1 refills | Status: DC | PRN

## 2021-12-11 DIAGNOSIS — Z4509 Encounter for adjustment and management of other cardiac device: Secondary | ICD-10-CM | POA: Diagnosis not present

## 2021-12-11 DIAGNOSIS — Z95818 Presence of other cardiac implants and grafts: Secondary | ICD-10-CM | POA: Diagnosis not present

## 2021-12-12 ENCOUNTER — Encounter: Payer: Self-pay | Admitting: Family Medicine

## 2021-12-13 DIAGNOSIS — J9801 Acute bronchospasm: Secondary | ICD-10-CM | POA: Diagnosis not present

## 2021-12-13 DIAGNOSIS — U071 COVID-19: Secondary | ICD-10-CM | POA: Diagnosis not present

## 2021-12-24 DIAGNOSIS — Z4509 Encounter for adjustment and management of other cardiac device: Secondary | ICD-10-CM | POA: Diagnosis not present

## 2021-12-31 ENCOUNTER — Encounter: Payer: Self-pay | Admitting: Family Medicine

## 2022-01-01 DIAGNOSIS — E785 Hyperlipidemia, unspecified: Secondary | ICD-10-CM | POA: Diagnosis not present

## 2022-01-01 DIAGNOSIS — I110 Hypertensive heart disease with heart failure: Secondary | ICD-10-CM | POA: Diagnosis not present

## 2022-01-01 DIAGNOSIS — K219 Gastro-esophageal reflux disease without esophagitis: Secondary | ICD-10-CM | POA: Diagnosis not present

## 2022-01-01 DIAGNOSIS — I4819 Other persistent atrial fibrillation: Secondary | ICD-10-CM | POA: Diagnosis not present

## 2022-01-01 DIAGNOSIS — Z79899 Other long term (current) drug therapy: Secondary | ICD-10-CM | POA: Diagnosis not present

## 2022-01-01 DIAGNOSIS — Z91041 Radiographic dye allergy status: Secondary | ICD-10-CM | POA: Diagnosis not present

## 2022-01-01 DIAGNOSIS — Z888 Allergy status to other drugs, medicaments and biological substances status: Secondary | ICD-10-CM | POA: Diagnosis not present

## 2022-01-01 DIAGNOSIS — I509 Heart failure, unspecified: Secondary | ICD-10-CM | POA: Diagnosis not present

## 2022-01-01 DIAGNOSIS — Z87891 Personal history of nicotine dependence: Secondary | ICD-10-CM | POA: Diagnosis not present

## 2022-01-01 DIAGNOSIS — Z91012 Allergy to eggs: Secondary | ICD-10-CM | POA: Diagnosis not present

## 2022-01-01 DIAGNOSIS — Z7951 Long term (current) use of inhaled steroids: Secondary | ICD-10-CM | POA: Diagnosis not present

## 2022-01-01 DIAGNOSIS — Z882 Allergy status to sulfonamides status: Secondary | ICD-10-CM | POA: Diagnosis not present

## 2022-01-01 DIAGNOSIS — J45909 Unspecified asthma, uncomplicated: Secondary | ICD-10-CM | POA: Diagnosis not present

## 2022-01-01 DIAGNOSIS — I480 Paroxysmal atrial fibrillation: Secondary | ICD-10-CM | POA: Diagnosis not present

## 2022-01-03 DIAGNOSIS — I480 Paroxysmal atrial fibrillation: Secondary | ICD-10-CM | POA: Diagnosis not present

## 2022-01-03 DIAGNOSIS — K219 Gastro-esophageal reflux disease without esophagitis: Secondary | ICD-10-CM | POA: Diagnosis not present

## 2022-01-03 DIAGNOSIS — I110 Hypertensive heart disease with heart failure: Secondary | ICD-10-CM | POA: Diagnosis not present

## 2022-01-03 DIAGNOSIS — I5030 Unspecified diastolic (congestive) heart failure: Secondary | ICD-10-CM | POA: Diagnosis not present

## 2022-01-03 DIAGNOSIS — J4520 Mild intermittent asthma, uncomplicated: Secondary | ICD-10-CM | POA: Diagnosis not present

## 2022-01-03 DIAGNOSIS — I2510 Atherosclerotic heart disease of native coronary artery without angina pectoris: Secondary | ICD-10-CM | POA: Diagnosis not present

## 2022-01-03 DIAGNOSIS — Z8616 Personal history of COVID-19: Secondary | ICD-10-CM | POA: Diagnosis not present

## 2022-01-03 DIAGNOSIS — E119 Type 2 diabetes mellitus without complications: Secondary | ICD-10-CM | POA: Diagnosis not present

## 2022-01-03 DIAGNOSIS — I4819 Other persistent atrial fibrillation: Secondary | ICD-10-CM | POA: Diagnosis not present

## 2022-01-08 ENCOUNTER — Other Ambulatory Visit: Payer: Self-pay | Admitting: Family Medicine

## 2022-01-08 ENCOUNTER — Other Ambulatory Visit: Payer: Self-pay | Admitting: Physician Assistant

## 2022-01-08 DIAGNOSIS — G4709 Other insomnia: Secondary | ICD-10-CM

## 2022-01-11 DIAGNOSIS — Z95818 Presence of other cardiac implants and grafts: Secondary | ICD-10-CM | POA: Diagnosis not present

## 2022-01-11 DIAGNOSIS — Z4509 Encounter for adjustment and management of other cardiac device: Secondary | ICD-10-CM | POA: Diagnosis not present

## 2022-01-31 DIAGNOSIS — H5213 Myopia, bilateral: Secondary | ICD-10-CM | POA: Diagnosis not present

## 2022-01-31 DIAGNOSIS — H5203 Hypermetropia, bilateral: Secondary | ICD-10-CM | POA: Diagnosis not present

## 2022-02-07 DIAGNOSIS — E119 Type 2 diabetes mellitus without complications: Secondary | ICD-10-CM | POA: Diagnosis not present

## 2022-02-07 DIAGNOSIS — Z7901 Long term (current) use of anticoagulants: Secondary | ICD-10-CM | POA: Diagnosis not present

## 2022-02-07 DIAGNOSIS — Z9889 Other specified postprocedural states: Secondary | ICD-10-CM | POA: Diagnosis not present

## 2022-02-07 DIAGNOSIS — Z79899 Other long term (current) drug therapy: Secondary | ICD-10-CM | POA: Diagnosis not present

## 2022-02-07 DIAGNOSIS — I110 Hypertensive heart disease with heart failure: Secondary | ICD-10-CM | POA: Diagnosis not present

## 2022-02-07 DIAGNOSIS — Z91040 Latex allergy status: Secondary | ICD-10-CM | POA: Diagnosis not present

## 2022-02-07 DIAGNOSIS — E785 Hyperlipidemia, unspecified: Secondary | ICD-10-CM | POA: Diagnosis not present

## 2022-02-07 DIAGNOSIS — I2510 Atherosclerotic heart disease of native coronary artery without angina pectoris: Secondary | ICD-10-CM | POA: Diagnosis not present

## 2022-02-07 DIAGNOSIS — I509 Heart failure, unspecified: Secondary | ICD-10-CM | POA: Diagnosis not present

## 2022-02-07 DIAGNOSIS — R55 Syncope and collapse: Secondary | ICD-10-CM | POA: Diagnosis not present

## 2022-02-07 DIAGNOSIS — I4819 Other persistent atrial fibrillation: Secondary | ICD-10-CM | POA: Diagnosis not present

## 2022-02-07 DIAGNOSIS — Z95818 Presence of other cardiac implants and grafts: Secondary | ICD-10-CM | POA: Diagnosis not present

## 2022-02-07 DIAGNOSIS — Z4509 Encounter for adjustment and management of other cardiac device: Secondary | ICD-10-CM | POA: Diagnosis not present

## 2022-02-07 DIAGNOSIS — Z87891 Personal history of nicotine dependence: Secondary | ICD-10-CM | POA: Diagnosis not present

## 2022-02-07 DIAGNOSIS — Z882 Allergy status to sulfonamides status: Secondary | ICD-10-CM | POA: Diagnosis not present

## 2022-02-09 ENCOUNTER — Other Ambulatory Visit: Payer: Self-pay | Admitting: Family Medicine

## 2022-02-09 DIAGNOSIS — U071 COVID-19: Secondary | ICD-10-CM

## 2022-02-09 DIAGNOSIS — G4709 Other insomnia: Secondary | ICD-10-CM

## 2022-02-09 DIAGNOSIS — M62838 Other muscle spasm: Secondary | ICD-10-CM

## 2022-02-09 DIAGNOSIS — R0602 Shortness of breath: Secondary | ICD-10-CM

## 2022-02-10 ENCOUNTER — Other Ambulatory Visit: Payer: Self-pay | Admitting: Family Medicine

## 2022-02-12 ENCOUNTER — Other Ambulatory Visit: Payer: Self-pay | Admitting: Family Medicine

## 2022-02-12 NOTE — Telephone Encounter (Signed)
Not prescribed by [place] originally-ok to fill?

## 2022-02-14 ENCOUNTER — Other Ambulatory Visit: Payer: BC Managed Care – PPO

## 2022-02-14 DIAGNOSIS — E291 Testicular hypofunction: Secondary | ICD-10-CM | POA: Diagnosis not present

## 2022-02-19 DIAGNOSIS — I491 Atrial premature depolarization: Secondary | ICD-10-CM | POA: Diagnosis not present

## 2022-02-22 LAB — CBC WITH DIFFERENTIAL/PLATELET
Basophils Absolute: 0 10*3/uL (ref 0.0–0.2)
Basos: 1 %
EOS (ABSOLUTE): 0.2 10*3/uL (ref 0.0–0.4)
Eos: 4 %
Hematocrit: 38.3 % (ref 37.5–51.0)
Hemoglobin: 12.4 g/dL — ABNORMAL LOW (ref 13.0–17.7)
Immature Grans (Abs): 0 10*3/uL (ref 0.0–0.1)
Immature Granulocytes: 0 %
Lymphocytes Absolute: 1.6 10*3/uL (ref 0.7–3.1)
Lymphs: 26 %
MCH: 29.7 pg (ref 26.6–33.0)
MCHC: 32.4 g/dL (ref 31.5–35.7)
MCV: 92 fL (ref 79–97)
Monocytes Absolute: 0.6 10*3/uL (ref 0.1–0.9)
Monocytes: 10 %
Neutrophils Absolute: 3.7 10*3/uL (ref 1.4–7.0)
Neutrophils: 59 %
Platelets: 253 10*3/uL (ref 150–450)
RBC: 4.18 x10E6/uL (ref 4.14–5.80)
RDW: 13 % (ref 11.6–15.4)
WBC: 6.2 10*3/uL (ref 3.4–10.8)

## 2022-02-22 LAB — PSA: Prostate Specific Ag, Serum: 0.1 ng/mL (ref 0.0–4.0)

## 2022-02-22 LAB — TESTOSTERONE, FREE, TOTAL, SHBG
Sex Hormone Binding: 73.7 nmol/L (ref 19.3–76.4)
Testosterone, Free: 0.5 pg/mL — ABNORMAL LOW (ref 6.6–18.1)
Testosterone: 91 ng/dL — ABNORMAL LOW (ref 264–916)

## 2022-02-24 ENCOUNTER — Encounter: Payer: Self-pay | Admitting: "Endocrinology

## 2022-02-24 ENCOUNTER — Ambulatory Visit: Payer: BC Managed Care – PPO | Admitting: "Endocrinology

## 2022-02-24 VITALS — BP 118/68 | HR 72 | Ht 68.0 in | Wt 198.2 lb

## 2022-02-24 DIAGNOSIS — E291 Testicular hypofunction: Secondary | ICD-10-CM

## 2022-02-24 DIAGNOSIS — E782 Mixed hyperlipidemia: Secondary | ICD-10-CM

## 2022-02-24 MED ORDER — TESTOSTERONE CYPIONATE 100 MG/ML IM SOLN: INTRAMUSCULAR | 1 refills | Status: DC

## 2022-02-24 NOTE — Progress Notes (Signed)
02/24/2022     Endocrinology follow-up note   [name], 62 y.o., male   Chief Complaint  Patient presents with   Follow-up    Hypogonadism, male     Past Medical History:  Diagnosis Date   Allergy    Alpha galactosidase deficiency    Anemia    past hx    Arthritis    Asthma    Blood transfusion without reported diagnosis    BPH (benign prostatic hypertrophy)    Cataract    removed left eye    Complication of anesthesia    Diabetes mellitus without complication (HCC)    Dysrhythmia    GERD (gastroesophageal reflux disease)    Heart murmur    History of kidney stones    HOH (hard of hearing)    Hyperlipidemia    Hypertension    Irregular heart beat    Muscular dystrophy ([place])    PONV (postoperative nausea and vomiting)    Sleep apnea    Wears hearing aid in both ears    Past Surgical History:  Procedure Laterality Date   CATARACT EXTRACTION Left    COLONOSCOPY     ESOPHAGOGASTRODUODENOSCOPY N/A 08/27/2020   Procedure: ESOPHAGOGASTRODUODENOSCOPY (EGD);  Surgeon: Lajuana Matte, MD;  Location: [facility];  Service: Thoracic;  Laterality: N/A;   FEMUR FRACTURE SURGERY     INGUINAL HERNIA REPAIR Right 08/07/2021   Procedure: HERNIA REPAIR INGUINAL ADULT WITH MESH;  Surgeon: Virl Cagey, MD;  Location: [facility];  Service: General;  Laterality: Right;   INSERTION OF MESH N/A 08/27/2020   Procedure: INSERTION OF ACELL 7.5 x 6cm GENTRIX SURGICAL MATRIX HIATAL MESH;  Surgeon: Lajuana Matte, MD;  Location: [facility];  Service: Thoracic;  Laterality: N/A;   KIDNEY STONE SURGERY     x6   KNEE SURGERY Right    x2   MASS EXCISION Right 08/02/2018   Procedure: EXCISION RIGHT LONG FINGER MASS, DEBRIDEMENT PROXIMAL INTERPHALANGEAL JOINT WITH ROTATION FLAP;  Surgeon: Leanora Cover, MD;  Location: [facility];  Service: Orthopedics;  Laterality: Right;   NISSEN FUNDOPLICATION     [facility]    SHOULDER OPEN  ROTATOR CUFF REPAIR     XI ROBOTIC ASSISTED HIATAL HERNIA REPAIR N/A 08/27/2020   Procedure: XI ROBOTIC ASSISTED REDO HIATAL HERNIA REPAIR;  Surgeon: Lajuana Matte, MD;  Location: [facility];  Service: Thoracic;  Laterality: N/A;   Social History   Socioeconomic History   Marital status: Married    Spouse name: Not on file   Number of children: Not on file   Years of education: Not on file   Highest education level: Not on file  Occupational History   Occupation: Physiological scientist  Tobacco Use   Smoking status: Former    Packs/day: 1.00    Years: 10.00    Total pack years: 10.00    Types: Cigarettes    Quit date: 01/06/1994    Years since quitting: 28.1   Smokeless tobacco: Current    Types: Snuff  Vaping Use   Vaping Use: Never used  Substance and Sexual Activity   Alcohol use: Yes    Alcohol/week: 2.0 - 3.0 standard drinks of alcohol    Types: 2 - 3 Standard drinks or equivalent per week    Comment: social   Drug use: No   Sexual activity: Not on file  Other Topics Concern   Not on file  Social History Narrative   Not on file  Social Determinants of Health   Financial Resource Strain: Not on file  Food Insecurity: Not on file  Transportation Needs: Not on file  Physical Activity: Not on file  Stress: Not on file  Social Connections: Not on file   Outpatient Encounter Medications as of 02/24/2022  Medication Sig   albuterol (PROVENTIL) (2.5 MG/3ML) 0.083% nebulizer solution NEBULIZE 1 VIAL EVERY 6 HOURS AS NEEDED FOR WHEEZING & SHORTNESS OF BREATH   albuterol (VENTOLIN HFA) 108 (90 Base) MCG/ACT inhaler INHALE 2 PUFFS EVERY 4 HOURS AS NEEDED   amitriptyline (ELAVIL) 100 MG tablet Take 2 tablets (200 mg total) by mouth at bedtime. (NEEDS TO BE SEEN BEFORE NEXT REFILL)   azelastine (ASTELIN) 0.1 % nasal spray Use in each nostril as directed   blood glucose meter kit and supplies Dispense based on patient and insurance preference. Use up to four times daily as  directed. (FOR ICD-10 E10.9, E11.9). Pt states needs One Touch Verio Meter and one touch delica plus test strips   Blood Glucose Monitoring Suppl (CONTOUR BLOOD GLUCOSE SYSTEM) w/Device KIT Test blood sugars four times daily   BREZTRI AEROSPHERE 160-9-4.8 MCG/ACT AERO USE 2 PUFFS EVERY MORNING AND AT BEDTIME   cyclobenzaprine (FLEXERIL) 10 MG tablet Take 1 tablet (10 mg total) by mouth 3 (three) times daily as needed for muscle spasms. (NEEDS TO BE SEEN BEFORE NEXT REFILL)   Dulaglutide (TRULICITY) A999333 0000000 SOPN Inject 0.75 mg into the skin once a week.   EPINEPHRINE 0.3 mg/0.3 mL IJ SOAJ injection INJECT 0.3ML (0.3MG) IM ONCE   esomeprazole (NEXIUM) 40 MG capsule TAKE 1 CAPSULE DAILY AT NOON (Patient taking differently: Take 40 mg by mouth daily.)   famotidine (PEPCID) 20 MG tablet Take 1 tablet (20 mg total) by mouth 2 (two) times daily.   ferrous sulfate 325 (65 FE) MG tablet Take 325 mg by mouth daily with breakfast.   finasteride (PROSCAR) 5 MG tablet Take one tablet daily   fluticasone (FLONASE) 50 MCG/ACT nasal spray USE 1 TO 2 SPRAYS IN EACH NOSTRIL DAILY   furosemide (LASIX) 20 MG tablet Take 1 tablet (20 mg total) by mouth daily as needed. (NEEDS TO BE SEEN BEFORE NEXT REFILL)   glucose blood (CONTOUR NEXT TEST) test strip Test blood sugars four times daily   losartan (COZAAR) 50 MG tablet TAKE ONE (1) TABLET BY MOUTH EVERY DAY   meloxicam (MOBIC) 7.5 MG tablet Take 1 tablet (7.5 mg total) by mouth at bedtime.   metoprolol succinate (TOPROL-XL) 50 MG 24 hr tablet Take 50 mg by mouth daily. Take with or immediately following a meal.   montelukast (SINGULAIR) 10 MG tablet TAKE ONE TABLET DAILY AT BEDTIME   pravastatin (PRAVACHOL) 80 MG tablet Take one tablet daily (Patient taking differently: Take 80 mg by mouth at bedtime.)   Spacer/Aero-Holding Dorise Bullion Use with inhaler daily   SYRINGE-NEEDLE, DISP, 3 ML 21G X 1-1/2" 3 ML MISC Use to inject testosterone every week   tadalafil  (CIALIS) 10 MG tablet Take 1 tablet (10 mg total) by mouth every other day as needed for erectile dysfunction.   tamsulosin (FLOMAX) 0.4 MG CAPS capsule Take one tablet after supper (Patient taking differently: Take 0.4 mg by mouth daily.)   testosterone cypionate (DEPOTESTOTERONE CYPIONATE) 100 MG/ML injection Inject 100 mg every 10 days   [DISCONTINUED] testosterone cypionate (DEPOTESTOTERONE CYPIONATE) 100 MG/ML injection Inject 1 mL (100 mg total) into the muscle every 14 (fourteen) days. For IM use only   No  facility-administered encounter medications on file as of 02/24/2022.   ALLERGIES: Allergies  Allergen Reactions   Eggs Or Egg-Derived Products Swelling    Throat- 03-31-2019 pt states can eat cakes and Pie with no issues  Throat- 03-31-2019 pt states can eat cakes and Pie with no issues    Septra [Sulfamethoxazole-Trimethoprim] Swelling    throat   Sulfonamide Derivatives Hives    VACCINATION STATUS: Immunization History  Administered Date(s) Administered   Influenza Inj Mdck Quad Pf 09/24/2018, 10/22/2020   Influenza, Quadrivalent, Recombinant, Inj, Pf 01/15/2017   Influenza-Unspecified 09/24/2018, 10/31/2019   Moderna SARS-COV2 Booster Vaccination 04/26/2020   Moderna Sars-Covid-2 Vaccination 01/15/2019, 02/12/2019, 09/07/2019   Pneumococcal Polysaccharide-23 11/09/2019   Tdap 07/20/2009, 11/09/2019   Zoster Recombinat (Shingrix) 08/22/2020, 02/22/2021    HPI: ZYGMONT HORVATH is a 62 y.o.-year-old man.  He is returning for a follow-up after he was seen in consultation for hypogonadism.  He does not have new complaints today. He notes from previous visits. He reports that he has hypogonadism for as long as he remembers, does not father any biological children although he adopted 3 kids.  He reports to have been told that he cannot father any children due to low sperm count. -He was treated with testosterone pellets in a clinic in [place] on 2 separate occasions between  2018 and 2019 , the dose details are not available to review.   He reports consistency with injection of his testosterone every other week.  Was the last day of his injection cycle his total testosterone was low at 91 dropping from 614 during his last visit.    He reports symptomatic improvement including having more energy while on testosterone replacement therapy.  He denies  trauma to testes,  chemotherapy,  testicular irradiation,  nor genitourinary surgery. Denies new complaints since last visit.  He wishes to be continued on testosterone treatment.   -His recent labs show normal CBC, PSA   -  He has asthma on various inhalers. No chronic pain. Not on opiates, does not take steroids.    He does not have family  Or personal history of premature  cardiac disease.  ROS: Limited as above.  PE: BP 118/68   Pulse 72   Ht 5' 8"$  (1.727 m)   Wt 198 lb 3.2 oz (89.9 kg)   BMI 30.14 kg/m  Wt Readings from Last 3 Encounters:  02/24/22 198 lb 3.2 oz (89.9 kg)  11/01/21 205 lb (93 kg)  10/22/21 207 lb 6.4 oz (94.1 kg)    Genital exam: normal male escutcheon, no inguinal LAD, normal phallus, significantly shrunk testes bilaterally to 5 mL, no testicular or scrotal mass lesions.  no penile discharge.  No gynecomastia.   CMP ( most recent) CMP     Component Value Date/Time   NA 140 11/01/2021 1026   K 4.7 11/01/2021 1026   CL 102 11/01/2021 1026   CO2 24 11/01/2021 1026   GLUCOSE 88 11/01/2021 1026   GLUCOSE 88 08/28/2020 0618   BUN 15 11/01/2021 1026   CREATININE 1.22 11/01/2021 1026   CALCIUM 9.7 11/01/2021 1026   PROT 6.6 11/01/2021 1026   ALBUMIN 4.4 11/01/2021 1026   AST 25 11/01/2021 1026   ALT 20 11/01/2021 1026   ALKPHOS 121 11/01/2021 1026   BILITOT 0.3 11/01/2021 1026   GFRNONAA >60 08/28/2020 0618   GFRAA 74 02/23/2020 0929     Diabetic Labs (most recent): Lab Results  Component Value Date   HGBA1C 5.2 08/23/2021  HGBA1C 4.9 05/22/2021   HGBA1C 4.9  02/22/2021     Lipid Panel ( most recent) Lipid Panel     Component Value Date/Time   CHOL 165 06/28/2021 0807   TRIG 110 06/28/2021 0807   HDL 59 06/28/2021 0807   CHOLHDL 2.8 06/28/2021 0807   CHOLHDL 4.8 03/30/2009 1807   VLDL 20 03/30/2009 1807   LDLCALC 86 06/28/2021 0807    Recent Results (from the past 2160 hour(s))  Testosterone, Free, Total, SHBG     Status: Abnormal   Collection Time: 02/14/22  9:40 AM  Result Value Ref Range   Testosterone 91 (L) 264 - 916 ng/dL    Comment: Adult male reference interval is based on a population of healthy nonobese males (BMI <30) between 75 and 39 years old. [place], [place] [phone]. PMID: [id number].    Testosterone, Free 0.5 (L) 6.6 - 18.1 pg/mL   Sex Hormone Binding 73.7 19.3 - 76.4 nmol/L  PSA     Status: None   Collection Time: 02/14/22  9:40 AM  Result Value Ref Range   Prostate Specific Ag, Serum 0.1 0.0 - 4.0 ng/mL    Comment: Roche ECLIA methodology. According to the American Urological Association, Serum PSA should decrease and remain at undetectable levels after radical prostatectomy. The AUA defines biochemical recurrence as an initial PSA value 0.2 ng/mL or greater followed by a subsequent confirmatory PSA value 0.2 ng/mL or greater. Values obtained with different assay methods or kits cannot be used interchangeably. Results cannot be interpreted as absolute evidence of the presence or absence of malignant disease.   CBC with Differential/Platelet     Status: Abnormal   Collection Time: 02/14/22  9:40 AM  Result Value Ref Range   WBC 6.2 3.4 - 10.8 x10E3/uL   RBC 4.18 4.14 - 5.80 x10E6/uL   Hemoglobin 12.4 (L) 13.0 - 17.7 g/dL   Hematocrit 38.3 37.5 - 51.0 %   MCV 92 79 - 97 fL   MCH 29.7 26.6 - 33.0 pg   MCHC 32.4 31.5 - 35.7 g/dL   RDW 13.0 11.6 - 15.4 %   Platelets 253 150 - 450 x10E3/uL   Neutrophils 59 Not Estab. %   Lymphs 26 Not Estab. %   Monocytes 10 Not Estab. %   Eos 4 Not Estab.  %   Basos 1 Not Estab. %   Neutrophils Absolute 3.7 1.4 - 7.0 x10E3/uL   Lymphocytes Absolute 1.6 0.7 - 3.1 x10E3/uL   Monocytes Absolute 0.6 0.1 - 0.9 x10E3/uL   EOS (ABSOLUTE) 0.2 0.0 - 0.4 x10E3/uL   Basophils Absolute 0.0 0.0 - 0.2 x10E3/uL   Immature Granulocytes 0 Not Estab. %   Immature Grans (Abs) 0.0 0.0 - 0.1 x10E3/uL     ASSESSMENT:  1. Hypogonadism   PLAN:  See notes from previous visits.  He returns with suboptimal total testosterone at the end of his injection cycle.  He will benefit from slight increase.  I discussed and increase his testosterone to 100 mg IM every 10 days with plan to repeat total testosterone before his next visit in 4 months.  This will give him a total of 300 mg of testosterone monthly.   -  I discussed adverse effects of unnecessary testosterone replacement short-term and long-term.  In this patient with bilaterally shrunk testicles, his hypogonadism is likely chronic and primary. I discussed with him the fact that testosterone replacement will further diminish his chance of fertility.  At this point, he is not  interested to keep his fertility.  -Given his medical history of sleep apnea and BPH which are relative contraindications for testosterone replacement, his testosterone dose will be titrated slowly based on his clinical response.    -He is already on PDE 5 inhibitors for ED.   Did have history of diabetes, which seems to have reversed.  His A1c was 4.9% in May 2023.  He remains on Trulicity A999333 mg subcutaneously weekly.     He is encouraged to keep close follow-up with his PMD.   I spent  20  minutes in the care of the patient today including review of labs from Thyroid Function, CMP, and other relevant labs ; imaging/biopsy records (current and previous including abstractions from other facilities); face-to-face time discussing  his lab results and symptoms, medications doses, his options of short and long term treatment based on the  latest standards of care / guidelines;   and documenting the encounter.  [name]  participated in the discussions, expressed understanding, and voiced agreement with the above plans.  All questions were answered to his satisfaction. he is encouraged to contact clinic should he have any questions or concerns prior to his return visit.   Return in about 4 months (around 06/25/2022) for Fasting Labs  in AM B4 8.  Glade Lloyd, MD [facility] [address] [place], [place] [address] Phone: [phone]  Fax: [phone]   02/24/2022, 4:09 PM  This note was partially dictated with voice recognition software. Similar sounding words can be transcribed inadequately or may not  be corrected upon review.

## 2022-03-04 ENCOUNTER — Encounter: Payer: Self-pay | Admitting: Family Medicine

## 2022-03-12 DIAGNOSIS — Z5181 Encounter for therapeutic drug level monitoring: Secondary | ICD-10-CM | POA: Diagnosis not present

## 2022-03-12 DIAGNOSIS — Z79899 Other long term (current) drug therapy: Secondary | ICD-10-CM | POA: Diagnosis not present

## 2022-03-12 DIAGNOSIS — Z79891 Long term (current) use of opiate analgesic: Secondary | ICD-10-CM | POA: Diagnosis not present

## 2022-03-12 DIAGNOSIS — F902 Attention-deficit hyperactivity disorder, combined type: Secondary | ICD-10-CM | POA: Diagnosis not present

## 2022-03-13 ENCOUNTER — Encounter: Payer: Self-pay | Admitting: Family Medicine

## 2022-03-13 ENCOUNTER — Ambulatory Visit: Payer: BC Managed Care – PPO | Admitting: Family Medicine

## 2022-03-13 VITALS — BP 138/71 | HR 64 | Ht 68.0 in | Wt 196.0 lb

## 2022-03-13 DIAGNOSIS — E1159 Type 2 diabetes mellitus with other circulatory complications: Secondary | ICD-10-CM

## 2022-03-13 DIAGNOSIS — I110 Hypertensive heart disease with heart failure: Secondary | ICD-10-CM | POA: Diagnosis not present

## 2022-03-13 DIAGNOSIS — E785 Hyperlipidemia, unspecified: Secondary | ICD-10-CM

## 2022-03-13 DIAGNOSIS — E119 Type 2 diabetes mellitus without complications: Secondary | ICD-10-CM | POA: Diagnosis not present

## 2022-03-13 DIAGNOSIS — E1169 Type 2 diabetes mellitus with other specified complication: Secondary | ICD-10-CM

## 2022-03-13 DIAGNOSIS — I5032 Chronic diastolic (congestive) heart failure: Secondary | ICD-10-CM

## 2022-03-13 DIAGNOSIS — I10 Essential (primary) hypertension: Secondary | ICD-10-CM

## 2022-03-13 LAB — BAYER DCA HB A1C WAIVED: HB A1C (BAYER DCA - WAIVED): 5.2 % (ref 4.8–5.6)

## 2022-03-13 MED ORDER — SEMAGLUTIDE (1 MG/DOSE) 4 MG/3ML ~~LOC~~ SOPN: 1.0000 mg | PEN_INJECTOR | SUBCUTANEOUS | 3 refills | Status: DC

## 2022-03-13 NOTE — Progress Notes (Signed)
BP 138/71   Pulse 64   Ht '5\' 8"'$  (1.727 m)   Wt 196 lb (88.9 kg)   SpO2 98%   BMI 29.80 kg/m    Subjective:   Patient ID: Johnny Mathews, male    DOB: 01/24/1960, 62 y.o.   MRN: [id number]  HPI: Johnny Mathews is a 62 y.o. male presenting on 03/13/2022 for Medical Management of Chronic Issues and Diabetes   HPI Type 2 diabetes mellitus Patient comes in today for recheck of his diabetes. Patient has been currently taking Trulicity. Patient is currently on an ACE inhibitor/ARB. Patient has not seen an ophthalmologist this year. Patient denies any issues with their feet. The symptom started onset as an adult hypertension and hyperlipidemia ARE RELATED TO DM.  Patient says has gained some weight and he feels like his blood sugars been running up a little bit.  He feels like the Trulicity is not doing as well for him and he would like to try the Ozempic instead.  He says he is gained a lot of weight over the past couple months since his hernia surgery.  Hypertension Patient is currently on furosemide and losartan and metoprolol, and their blood pressure today is 138/71. Patient denies any lightheadedness or dizziness. Patient denies headaches, blurred vision, chest pains, shortness of breath, or weakness. Denies any side effects from medication and is content with current medication.   Hyperlipidemia Patient is coming in for recheck of his hyperlipidemia. The patient is currently taking pravastatin. They deny any issues with myalgias or history of liver damage from it. They deny any focal numbness or weakness or chest pain.   Relevant past medical, surgical, family and social history reviewed and updated as indicated. Interim medical history since our last visit reviewed. Allergies and medications reviewed and updated.  Review of Systems  Constitutional:  Negative for chills and fever.  Eyes:  Negative for visual disturbance.  Respiratory:  Negative for shortness of breath and wheezing.    Cardiovascular:  Negative for chest pain and leg swelling.  Musculoskeletal:  Negative for back pain and gait problem.  Skin:  Negative for rash.  Neurological:  Negative for dizziness, weakness and numbness.  All other systems reviewed and are negative.   Per HPI unless specifically indicated above   Allergies as of 03/13/2022       Reactions   Egg-derived Products Swelling   Throat- 03-31-2019 pt states can eat cakes and Pie with no issues  Throat- 03-31-2019 pt states can eat cakes and Pie with no issues    Septra [sulfamethoxazole-trimethoprim] Swelling   throat   Sulfonamide Derivatives Hives        Medication List        Accurate as of March 13, 2022  4:44 PM. If you have any questions, ask your nurse or doctor.          STOP taking these medications    Trulicity [id number] Sopn Generic drug: Dulaglutide Stopped by: Fransisca Kaufmann Merline Perkin, MD       TAKE these medications    albuterol 108 (90 Base) MCG/ACT inhaler Commonly known as: VENTOLIN HFA INHALE 2 PUFFS EVERY 4 HOURS AS NEEDED   albuterol (2.5 MG/3ML) 0.083% nebulizer solution Commonly known as: PROVENTIL NEBULIZE 1 VIAL EVERY 6 HOURS AS NEEDED FOR WHEEZING & SHORTNESS OF BREATH   amitriptyline 100 MG tablet Commonly known as: ELAVIL Take 2 tablets (200 mg total) by mouth at bedtime. (NEEDS TO BE SEEN BEFORE NEXT REFILL)  azelastine 0.1 % nasal spray Commonly known as: ASTELIN Use in each nostril as directed   blood glucose meter kit and supplies Dispense based on patient and insurance preference. Use up to four times daily as directed. (FOR ICD-10 E10.9, E11.9). Pt states needs One Touch Verio Meter and one touch delica plus test strips   Breztri Aerosphere 160-9-4.8 MCG/ACT Aero Generic drug: Budeson-Glycopyrrol-Formoterol USE 2 PUFFS EVERY MORNING AND AT BEDTIME   Contour Blood Glucose System w/Device Kit Test blood sugars four times daily   cyclobenzaprine 10 MG tablet Commonly known  as: FLEXERIL Take 1 tablet (10 mg total) by mouth 3 (three) times daily as needed for muscle spasms. (NEEDS TO BE SEEN BEFORE NEXT REFILL)   EPINEPHrine 0.3 mg/0.3 mL Soaj injection Commonly known as: EPI-PEN INJECT 0.3ML (0.'3MG'$ ) IM ONCE   esomeprazole 40 MG capsule Commonly known as: NEXIUM TAKE 1 CAPSULE DAILY AT NOON What changed:  how much to take how to take this when to take this additional instructions   famotidine 20 MG tablet Commonly known as: PEPCID Take 1 tablet (20 mg total) by mouth 2 (two) times daily.   ferrous sulfate 325 (65 FE) MG tablet Take 325 mg by mouth daily with breakfast.   finasteride 5 MG tablet Commonly known as: PROSCAR Take one tablet daily   fluticasone 50 MCG/ACT nasal spray Commonly known as: FLONASE USE 1 TO 2 SPRAYS IN EACH NOSTRIL DAILY   furosemide 20 MG tablet Commonly known as: LASIX Take 1 tablet (20 mg total) by mouth daily as needed. (NEEDS TO BE SEEN BEFORE NEXT REFILL)   glucose blood test strip Commonly known as: Contour Next Test Test blood sugars four times daily   losartan 50 MG tablet Commonly known as: COZAAR TAKE ONE (1) TABLET BY MOUTH EVERY DAY   meloxicam 7.5 MG tablet Commonly known as: MOBIC Take 1 tablet (7.5 mg total) by mouth at bedtime.   metoprolol succinate 50 MG 24 hr tablet Commonly known as: TOPROL-XL Take 50 mg by mouth daily. Take with or immediately following a meal.   montelukast 10 MG tablet Commonly known as: SINGULAIR TAKE ONE TABLET DAILY AT BEDTIME   pravastatin 80 MG tablet Commonly known as: PRAVACHOL Take one tablet daily What changed:  how much to take how to take this when to take this additional instructions   Semaglutide (1 MG/DOSE) 4 MG/3ML Sopn Inject 1 mg as directed once a week. Started by: Worthy Rancher, MD   Spacer/Aero-Holding Dorise Bullion Use with inhaler daily   SYRINGE-NEEDLE (DISP) 3 ML 21G X 1-1/2" 3 ML Misc Use to inject testosterone every  week   tadalafil 10 MG tablet Commonly known as: CIALIS Take 1 tablet (10 mg total) by mouth every other day as needed for erectile dysfunction.   tamsulosin 0.4 MG Caps capsule Commonly known as: FLOMAX Take one tablet after supper What changed:  how much to take how to take this when to take this additional instructions   testosterone cypionate 100 MG/ML injection Commonly known as: DEPOTESTOTERONE CYPIONATE Inject 100 mg every 10 days         Objective:   BP 138/71   Pulse 64   Ht '5\' 8"'$  (1.727 m)   Wt 196 lb (88.9 kg)   SpO2 98%   BMI 29.80 kg/m   Wt Readings from Last 3 Encounters:  03/13/22 196 lb (88.9 kg)  02/24/22 198 lb 3.2 oz (89.9 kg)  11/01/21 205 lb (93 kg)  Physical Exam Vitals and nursing note reviewed.  Constitutional:      General: He is not in acute distress.    Appearance: He is well-developed. He is not diaphoretic.  Eyes:     General: No scleral icterus.    Conjunctiva/sclera: Conjunctivae normal.  Neck:     Thyroid: No thyromegaly.  Cardiovascular:     Rate and Rhythm: Normal rate and regular rhythm.     Heart sounds: Murmur (3-6 systolic murmur best heard at right second intercostal space) heard.  Pulmonary:     Effort: Pulmonary effort is normal. No respiratory distress.     Breath sounds: Normal breath sounds. No wheezing.  Musculoskeletal:        General: Normal range of motion.     Cervical back: Neck supple.  Lymphadenopathy:     Cervical: No cervical adenopathy.  Skin:    General: Skin is warm and dry.     Findings: No rash.  Neurological:     Mental Status: He is alert and oriented to person, place, and time.     Coordination: Coordination normal.  Psychiatric:        Behavior: Behavior normal.       Assessment & Plan:   Problem List Items Addressed This Visit       Cardiovascular and Mediastinum   Essential hypertension   Chronic heart failure with preserved ejection fraction (HCC)     Endocrine   Type  2 diabetes mellitus without complication, without long-term current use of insulin (HCC) - Primary   Relevant Medications   Semaglutide, 1 MG/DOSE, 4 MG/3ML SOPN   Other Relevant Orders   BMP8+EGFR   Bayer DCA Hb A1c Waived (Completed)     Other   Hyperlipidemia with target LDL less than 100    Patient's A1c is 5.2.  Will switch to Ozempic because he feels like the Trulicity is not doing as well and his blood sugars are running higher and he wants to try for the weight loss. Follow up plan: Return in about 3 months (around 06/13/2022), or if symptoms worsen or fail to improve, for Diabetes recheck.  Counseling provided for all of the vaccine components Orders Placed This Encounter  Procedures   BMP8+EGFR   Bayer DCA Hb A1c [place], MD [facility] 03/13/2022, 4:44 PM

## 2022-03-14 DIAGNOSIS — Z95818 Presence of other cardiac implants and grafts: Secondary | ICD-10-CM | POA: Diagnosis not present

## 2022-03-14 LAB — BMP8+EGFR
BUN/Creatinine Ratio: 12 (ref 10–24)
BUN: 14 mg/dL (ref 8–27)
CO2: 22 mmol/L (ref 20–29)
Calcium: 9.7 mg/dL (ref 8.6–10.2)
Chloride: 100 mmol/L (ref 96–106)
Creatinine, Ser: 1.19 mg/dL (ref 0.76–1.27)
Glucose: 71 mg/dL (ref 70–99)
Potassium: 4.3 mmol/L (ref 3.5–5.2)
Sodium: 138 mmol/L (ref 134–144)
eGFR: 69 mL/min/{1.73_m2} (ref >59)

## 2022-03-19 ENCOUNTER — Encounter: Payer: Self-pay | Admitting: Gastroenterology

## 2022-03-21 ENCOUNTER — Other Ambulatory Visit (HOSPITAL_COMMUNITY): Payer: Self-pay

## 2022-03-24 ENCOUNTER — Encounter: Payer: Self-pay | Admitting: Family Medicine

## 2022-04-21 DIAGNOSIS — Z79899 Other long term (current) drug therapy: Secondary | ICD-10-CM | POA: Diagnosis not present

## 2022-04-21 DIAGNOSIS — F902 Attention-deficit hyperactivity disorder, combined type: Secondary | ICD-10-CM | POA: Diagnosis not present

## 2022-04-22 ENCOUNTER — Other Ambulatory Visit (HOSPITAL_COMMUNITY): Payer: Self-pay

## 2022-04-22 ENCOUNTER — Telehealth: Payer: Self-pay

## 2022-04-22 NOTE — Telephone Encounter (Signed)
PA request received via CMM for Depo-Testosterone /ML solution  PA has been submitted to [employer] via CMM and is pending additional questions/determination  Key: [id number]

## 2022-04-28 ENCOUNTER — Other Ambulatory Visit (HOSPITAL_COMMUNITY): Payer: Self-pay

## 2022-04-28 NOTE — Telephone Encounter (Signed)
Pharmacy Patient Advocate Encounter  Prior Authorization for Depo-Testosterone /ML solution has been approved by [employer]).    PA #  PA Case ID: [id number]   Effective dates:  through 04/22/23  Approved today. No PA letter.   Spoke with Pharmacy Pt got a 3 month supply in Feb. No refills on the RX. Pt will need a new rx next time he's due.

## 2022-05-06 ENCOUNTER — Other Ambulatory Visit: Payer: Self-pay | Admitting: Family Medicine

## 2022-05-06 DIAGNOSIS — E785 Hyperlipidemia, unspecified: Secondary | ICD-10-CM

## 2022-05-20 ENCOUNTER — Other Ambulatory Visit: Payer: Self-pay | Admitting: Family Medicine

## 2022-05-20 DIAGNOSIS — G4709 Other insomnia: Secondary | ICD-10-CM

## 2022-06-04 ENCOUNTER — Other Ambulatory Visit: Payer: Self-pay | Admitting: Family Medicine

## 2022-06-11 DIAGNOSIS — F902 Attention-deficit hyperactivity disorder, combined type: Secondary | ICD-10-CM | POA: Diagnosis not present

## 2022-06-11 DIAGNOSIS — Z79899 Other long term (current) drug therapy: Secondary | ICD-10-CM | POA: Diagnosis not present

## 2022-06-15 DIAGNOSIS — Z95818 Presence of other cardiac implants and grafts: Secondary | ICD-10-CM | POA: Diagnosis not present

## 2022-06-19 ENCOUNTER — Other Ambulatory Visit: Payer: Self-pay | Admitting: Family Medicine

## 2022-06-19 DIAGNOSIS — G4709 Other insomnia: Secondary | ICD-10-CM

## 2022-06-23 ENCOUNTER — Other Ambulatory Visit: Payer: BC Managed Care – PPO

## 2022-06-23 DIAGNOSIS — E291 Testicular hypofunction: Secondary | ICD-10-CM | POA: Diagnosis not present

## 2022-06-25 ENCOUNTER — Ambulatory Visit: Payer: BC Managed Care – PPO | Admitting: "Endocrinology

## 2022-06-26 LAB — TESTOSTERONE, FREE, TOTAL, SHBG
Sex Hormone Binding: 77.9 nmol/L — ABNORMAL HIGH (ref 19.3–76.4)
Testosterone, Free: 3.4 pg/mL — ABNORMAL LOW (ref 6.6–18.1)
Testosterone: 267 ng/dL (ref 264–916)

## 2022-06-28 ENCOUNTER — Encounter: Payer: Self-pay | Admitting: Family Medicine

## 2022-06-29 ENCOUNTER — Encounter: Payer: Self-pay | Admitting: "Endocrinology

## 2022-07-01 ENCOUNTER — Encounter: Payer: Self-pay | Admitting: "Endocrinology

## 2022-07-01 ENCOUNTER — Telehealth: Payer: Self-pay

## 2022-07-01 ENCOUNTER — Ambulatory Visit: Payer: BC Managed Care – PPO | Admitting: "Endocrinology

## 2022-07-01 VITALS — BP 112/80 | HR 92 | Ht 68.0 in | Wt 184.6 lb

## 2022-07-01 DIAGNOSIS — E291 Testicular hypofunction: Secondary | ICD-10-CM | POA: Diagnosis not present

## 2022-07-01 DIAGNOSIS — E782 Mixed hyperlipidemia: Secondary | ICD-10-CM

## 2022-07-01 MED ORDER — TESTOSTERONE CYPIONATE 200 MG/ML IM SOLN: INTRAMUSCULAR | 0 refills | Status: DC

## 2022-07-01 MED ORDER — TESTOSTERONE CYPIONATE 100 MG/ML IM SOLN: INTRAMUSCULAR | 1 refills | Status: DC

## 2022-07-01 NOTE — Telephone Encounter (Signed)
Johnny Mathews with [facility] in [place] called stating they received a Rx for testosterone 100mg /mL States this dose is on backorder and they can only get the 200mg /mL. Requested a Rx for testosterone 200mg /mL inject 0.5mg  every 10 days.

## 2022-07-01 NOTE — Progress Notes (Signed)
07/01/2022     Endocrinology follow-up note   Johnny Mathews, 62 y.o., male   Chief Complaint  Patient presents with   Follow-up    Hypogonadism, male     Past Medical History:  Diagnosis Date   Allergy    Alpha galactosidase deficiency    Anemia    past hx    Arthritis    Asthma    Blood transfusion without reported diagnosis    BPH (benign prostatic hypertrophy)    Cataract    removed left eye    Complication of anesthesia    Diabetes mellitus without complication (HCC)    Dysrhythmia    GERD (gastroesophageal reflux disease)    Heart murmur    History of kidney stones    HOH (hard of hearing)    Hyperlipidemia    Hypertension    Irregular heart beat    Muscular dystrophy (HCC)    PONV (postoperative nausea and vomiting)    Sleep apnea    Wears hearing aid in both ears    Past Surgical History:  Procedure Laterality Date   CATARACT EXTRACTION Left    COLONOSCOPY     ESOPHAGOGASTRODUODENOSCOPY N/A 08/27/2020   Procedure: ESOPHAGOGASTRODUODENOSCOPY (EGD);  Surgeon: Corliss Skains, MD;  Location: [facility] OR;  Service: Thoracic;  Laterality: N/A;   FEMUR FRACTURE SURGERY     INGUINAL HERNIA REPAIR Right 08/07/2021   Procedure: HERNIA REPAIR INGUINAL ADULT WITH MESH;  Surgeon: Lucretia Roers, MD;  Location: [facility];  Service: General;  Laterality: Right;   INSERTION OF MESH N/A 08/27/2020   Procedure: INSERTION OF ACELL 7.5 x 6cm GENTRIX SURGICAL MATRIX HIATAL MESH;  Surgeon: Corliss Skains, MD;  Location: [facility] OR;  Service: Thoracic;  Laterality: N/A;   KIDNEY STONE SURGERY     x6   KNEE SURGERY Right    x2   MASS EXCISION Right 08/02/2018   Procedure: EXCISION RIGHT LONG FINGER MASS, DEBRIDEMENT PROXIMAL INTERPHALANGEAL JOINT WITH ROTATION FLAP;  Surgeon: Betha Loa, MD;  Location: [facility];  Service: Orthopedics;  Laterality: Right;   NISSEN FUNDOPLICATION     [facility]    SHOULDER OPEN  ROTATOR CUFF REPAIR     XI ROBOTIC ASSISTED HIATAL HERNIA REPAIR N/A 08/27/2020   Procedure: XI ROBOTIC ASSISTED REDO HIATAL HERNIA REPAIR;  Surgeon: Corliss Skains, MD;  Location: [facility] OR;  Service: Thoracic;  Laterality: N/A;   Social History   Socioeconomic History   Marital status: Married    Spouse name: Not on file   Number of children: Not on file   Years of education: Not on file   Highest education level: Not on file  Occupational History   Occupation: Designer, industrial/product  Tobacco Use   Smoking status: Former    Packs/day: 1.00    Years: 10.00    Additional pack years: 0.00    Total pack years: 10.00    Types: Cigarettes    Quit date: 01/06/1994    Years since quitting: 28.5   Smokeless tobacco: Current    Types: Snuff  Vaping Use   Vaping Use: Never used  Substance and Sexual Activity   Alcohol use: Yes    Alcohol/week: 2.0 - 3.0 standard drinks of alcohol    Types: 2 - 3 Standard drinks or equivalent per week    Comment: social   Drug use: No   Sexual activity: Not on file  Other Topics Concern   Not on file  Social History  Narrative   Not on file   Social Determinants of Health   Financial Resource Strain: Not on file  Food Insecurity: Not on file  Transportation Needs: Not on file  Physical Activity: Not on file  Stress: Not on file  Social Connections: Not on file   Outpatient Encounter Medications as of 07/01/2022  Medication Sig   testosterone cypionate (DEPOTESTOSTERONE CYPIONATE) 200 MG/ML injection Inject 100 mg (0.5 ml ) every 10 days . For IM use only.   albuterol (PROVENTIL) (2.5 MG/3ML) 0.083% nebulizer solution NEBULIZE 1 VIAL EVERY 6 HOURS AS NEEDED FOR WHEEZING & SHORTNESS OF BREATH   albuterol (VENTOLIN HFA) 108 (90 Base) MCG/ACT inhaler INHALE 2 PUFFS EVERY 4 HOURS AS NEEDED   amitriptyline (ELAVIL) 100 MG tablet TAKE 2 TABLETS BY MOUTH AT BEDTIME   azelastine (ASTELIN) 0.1 % nasal spray Use in each nostril as directed   blood glucose  meter kit and supplies Dispense based on patient and insurance preference. Use up to four times daily as directed. (FOR ICD-10 E10.9, E11.9). Pt states needs One Touch Verio Meter and one touch delica plus test strips   Blood Glucose Monitoring Suppl (CONTOUR BLOOD GLUCOSE SYSTEM) w/Device KIT Test blood sugars four times daily   BREZTRI AEROSPHERE 160-9-4.8 MCG/ACT AERO USE 2 PUFFS EVERY MORNING AND AT BEDTIME   cyclobenzaprine (FLEXERIL) 10 MG tablet Take 1 tablet (10 mg total) by mouth 3 (three) times daily as needed for muscle spasms. (NEEDS TO BE SEEN BEFORE NEXT REFILL)   EPINEPHRINE 0.3 mg/0.3 mL IJ SOAJ injection INJECT 0.3ML (0.3MG ) IM ONCE   esomeprazole (NEXIUM) 40 MG capsule TAKE 1 CAPSULE DAILY AT NOON (Patient taking differently: Take 40 mg by mouth daily.)   famotidine (PEPCID) 20 MG tablet Take 1 tablet (20 mg total) by mouth 2 (two) times daily.   ferrous sulfate 325 (65 FE) MG tablet Take 325 mg by mouth daily with breakfast.   finasteride (PROSCAR) 5 MG tablet Take one tablet daily   fluticasone (FLONASE) 50 MCG/ACT nasal spray USE 1 TO 2 SPRAYS IN EACH NOSTRIL DAILY   furosemide (LASIX) 20 MG tablet TAKE 1 TABLET BY MOUTH DAILY AS NEEDED   glucose blood (CONTOUR NEXT TEST) test strip Test blood sugars four times daily   losartan (COZAAR) 50 MG tablet TAKE ONE (1) TABLET BY MOUTH EVERY DAY   meloxicam (MOBIC) 7.5 MG tablet TAKE ONE (1) TABLET EACH DAY   montelukast (SINGULAIR) 10 MG tablet TAKE ONE TABLET DAILY AT BEDTIME   pravastatin (PRAVACHOL) 80 MG tablet TAKE ONE (1) TABLET EACH DAY   Semaglutide, 1 MG/DOSE, 4 MG/3ML SOPN Inject 1 mg as directed once a week.   Spacer/Aero-Holding Chambers DEVI Use with inhaler daily   SYRINGE-NEEDLE, DISP, 3 ML 21G X 1-1/2" 3 ML MISC Use to inject testosterone every week   tadalafil (CIALIS) 10 MG tablet Take 1 tablet (10 mg total) by mouth every other day as needed for erectile dysfunction.   tamsulosin (FLOMAX) 0.4 MG CAPS capsule TAKE  1 CAPSULE DAILY AFTER SUPPER   [DISCONTINUED] metoprolol succinate (TOPROL-XL) 50 MG 24 hr tablet Take 50 mg by mouth daily. Take with or immediately following a meal.   [DISCONTINUED] testosterone cypionate (DEPOTESTOTERONE CYPIONATE) 100 MG/ML injection Inject 100 mg every 10 days   [DISCONTINUED] testosterone cypionate (DEPOTESTOTERONE CYPIONATE) 100 MG/ML injection Inject 100 mg every 10 days   No facility-administered encounter medications on file as of 07/01/2022.   ALLERGIES: Allergies  Allergen Reactions   Egg-Derived  Products Swelling    Throat- 03-31-2019 pt states can eat cakes and Pie with no issues  Throat- 03-31-2019 pt states can eat cakes and Pie with no issues    Septra [Sulfamethoxazole-Trimethoprim] Swelling    throat   Sulfonamide Derivatives Hives    VACCINATION STATUS: Immunization History  Administered Date(s) Administered   Influenza Inj Mdck Quad Pf 09/24/2018, 10/22/2020   Influenza, Quadrivalent, Recombinant, Inj, Pf 01/15/2017   Influenza-Unspecified 09/24/2018, 10/31/2019   Moderna SARS-COV2 Booster Vaccination 04/26/2020   Moderna Sars-Covid-2 Vaccination 01/15/2019, 02/12/2019, 09/07/2019   Pneumococcal Polysaccharide-23 11/09/2019   Tdap 07/20/2009, 11/09/2019   Zoster Recombinat (Shingrix) 08/22/2020, 02/22/2021    HPI: FAYETTE HAMADA is a 62 y.o.-year-old man.  He is returning for a follow-up after he was seen in consultation for hypogonadism.  He does not have new complaints today. He notes from previous visits. He reports that he has hypogonadism for as long as he remembers, does not father any biological children although he adopted 3 kids.  He reports to have been told that he cannot father any children due to low sperm count. -He was treated with testosterone pellets in a clinic in [place] on 2 separate occasions between 2018 and 2019 , the dose details are not available to review.   He reports consistency with injection of his testosterone  every other week.  His previsit labs show total testosterone 276 improving from 9 200 his last visit.    He reports symptomatic improvement including having more energy while on testosterone replacement therapy and he wishes to be continued on testosterone replacement therapy.  He denies  trauma to testes,  chemotherapy,  testicular irradiation,  nor genitourinary surgery. Denies new complaints since last visit.  He wishes to be continued on testosterone treatment.   -His recent labs show normal CBC, PSA   -  He has asthma on various inhalers. No chronic pain. Not on opiates, does not take steroids.    He does not have family  Or personal history of premature  cardiac disease.  ROS: Limited as above.  PE: BP 112/80   Pulse 92   Ht 5\' 8"  (1.727 m)   Wt 184 lb 9.6 oz (83.7 kg)   BMI 28.07 kg/m  Wt Readings from Last 3 Encounters:  07/01/22 184 lb 9.6 oz (83.7 kg)  03/13/22 196 lb (88.9 kg)  02/24/22 198 lb 3.2 oz (89.9 kg)    Genital exam: normal male escutcheon, no inguinal LAD, normal phallus, significantly shrunk testes bilaterally to 5 mL, no testicular or scrotal mass lesions.  no penile discharge.  No gynecomastia.   CMP ( most recent) CMP     Component Value Date/Time   NA 138 03/13/2022 1453   K 4.3 03/13/2022 1453   CL 100 03/13/2022 1453   CO2 22 03/13/2022 1453   GLUCOSE 71 03/13/2022 1453   GLUCOSE 88 08/28/2020 0618   BUN 14 03/13/2022 1453   CREATININE 1.19 03/13/2022 1453   CALCIUM 9.7 03/13/2022 1453   PROT 6.6 11/01/2021 1026   ALBUMIN 4.4 11/01/2021 1026   AST 25 11/01/2021 1026   ALT 20 11/01/2021 1026   ALKPHOS 121 11/01/2021 1026   BILITOT 0.3 11/01/2021 1026   GFRNONAA >60 08/28/2020 0618   GFRAA 74 02/23/2020 0929     Diabetic Labs (most recent): Lab Results  Component Value Date   HGBA1C 5.2 03/13/2022   HGBA1C 5.2 08/23/2021   HGBA1C 4.9 05/22/2021     Lipid Panel ( most  recent) Lipid Panel     Component Value Date/Time    CHOL 165 06/28/2021 0807   TRIG 110 06/28/2021 0807   HDL 59 06/28/2021 0807   CHOLHDL 2.8 06/28/2021 0807   CHOLHDL 4.8 03/30/2009 1807   VLDL 20 03/30/2009 1807   LDLCALC 86 06/28/2021 0807    Recent Results (from the past 2160 hour(s))  Testosterone, Free, Total, SHBG     Status: Abnormal   Collection Time: 06/23/22 10:49 AM  Result Value Ref Range   Testosterone 267 264 - 916 ng/dL    Comment: Adult male reference interval is based on a population of healthy nonobese males (BMI <30) between 67 and 56 years old. Travison, et.al. JCEM [phone]. PMID: [id number].    Testosterone, Free 3.4 (L) 6.6 - 18.1 pg/mL   Sex Hormone Binding 77.9 (H) 19.3 - 76.4 nmol/L     ASSESSMENT:  1. Hypogonadism   PLAN:  See notes from previous visits.  He returns with acceptable total testosterone 276.  He is advised to continue  testosterone  100 mg IM every 10 days with plan to repeat total testosterone before his next visit in 4 months.  This will give him a total of 300 mg of testosterone monthly.   -  I discussed adverse effects of unnecessary testosterone replacement short-term and long-term.  In this patient with bilaterally shrunk testicles, his hypogonadism is likely chronic and primary. I discussed with him the fact that testosterone replacement will further diminish his chance of fertility.  At this point, he is not interested to keep his fertility.  -Given his medical history of sleep apnea and BPH which are relative contraindications for testosterone replacement, his testosterone dose will be titrated slowly based on his clinical response.    -He is already on PDE 5 inhibitors for ED.   Did have history of diabetes, which seems to have reversed.  His A1c was 4.9% in May 2023.  He remains on Trulicity 0.75 mg subcutaneously weekly.     He is encouraged to keep close follow-up with his PMD.    I spent  19  minutes in the care of the patient today including review of labs  from Thyroid Function, CMP, and other relevant labs ; imaging/biopsy records (current and previous including abstractions from other facilities); face-to-face time discussing  his lab results and symptoms, medications doses, his options of short and long term treatment based on the latest standards of care / guidelines;   and documenting the encounter.  Johnny Mathews  participated in the discussions, expressed understanding, and voiced agreement with the above plans.  All questions were answered to his satisfaction. he is encouraged to contact clinic should he have any questions or concerns prior to his return visit.    Return in about 4 months (around 10/31/2022) for Fasting Labs  in AM B4 8.  Marquis Lunch, MD [facility] [address] [place], [place] [address] Phone: [phone]  Fax: [phone]   07/01/2022, 6:45 PM  This note was partially dictated with voice recognition software. Similar sounding words can be transcribed inadequately or may not  be corrected upon review.

## 2022-07-16 DIAGNOSIS — Z95818 Presence of other cardiac implants and grafts: Secondary | ICD-10-CM | POA: Diagnosis not present

## 2022-07-18 ENCOUNTER — Encounter: Payer: Self-pay | Admitting: Family Medicine

## 2022-07-25 ENCOUNTER — Ambulatory Visit: Payer: BC Managed Care – PPO | Admitting: Family Medicine

## 2022-07-25 ENCOUNTER — Encounter: Payer: Self-pay | Admitting: Family Medicine

## 2022-07-25 VITALS — BP 142/77 | HR 78 | Temp 98.3°F | Ht 68.0 in | Wt 184.0 lb

## 2022-07-25 DIAGNOSIS — G7100 Muscular dystrophy, unspecified: Secondary | ICD-10-CM | POA: Diagnosis not present

## 2022-07-25 DIAGNOSIS — R42 Dizziness and giddiness: Secondary | ICD-10-CM

## 2022-07-25 DIAGNOSIS — R21 Rash and other nonspecific skin eruption: Secondary | ICD-10-CM | POA: Diagnosis not present

## 2022-07-25 DIAGNOSIS — R55 Syncope and collapse: Secondary | ICD-10-CM | POA: Diagnosis not present

## 2022-07-25 DIAGNOSIS — R296 Repeated falls: Secondary | ICD-10-CM

## 2022-07-25 DIAGNOSIS — R011 Cardiac murmur, unspecified: Secondary | ICD-10-CM

## 2022-07-25 DIAGNOSIS — R6889 Other general symptoms and signs: Secondary | ICD-10-CM | POA: Diagnosis not present

## 2022-07-25 LAB — ANEMIA PROFILE B
Basophils Absolute: 0 10*3/uL (ref 0.0–0.2)
Basos: 1 %
Eos: 5 %
Hemoglobin: 12.1 g/dL — ABNORMAL LOW (ref 13.0–17.7)
Lymphocytes Absolute: 1.3 10*3/uL (ref 0.7–3.1)
Lymphs: 21 %
MCH: 32 pg (ref 26.6–33.0)
Neutrophils Absolute: 3.9 10*3/uL (ref 1.4–7.0)
RDW: 13 % (ref 11.6–15.4)
Retic Ct Pct: 1.2 % (ref 0.6–2.6)
WBC: 6.1 10*3/uL (ref 3.4–10.8)

## 2022-07-25 LAB — CMP14+EGFR

## 2022-07-25 LAB — RPR

## 2022-07-25 LAB — VITAMIN D 25 HYDROXY (VIT D DEFICIENCY, FRACTURES)

## 2022-07-25 MED ORDER — CEPHALEXIN 500 MG PO CAPS: 500.0000 mg | ORAL_CAPSULE | Freq: Four times a day (QID) | ORAL | 0 refills | Status: AC

## 2022-07-25 NOTE — Progress Notes (Signed)
Acute Office Visit  Subjective:  Patient ID: Johnny Mathews, male    DOB: 06-Feb-1960, 62 y.o.   MRN: [id number]  Chief Complaint  Patient presents with   Rash    Rash all over Falls - losing balance   Patient is in today for rash on his arms and falls.   Rash  Rash started 3 weeks ago and patient states that it will not going away. Has tried antibiotic ointments and OTC creams. Denies pain, fever. Endorses itching at the site and picking   Falls States that he has fallen frequently over the last few months. Initially reported 2 falls this past month, then remembered that he fell two other times. ~3 weeks ago had a witnessed fall while walking to the bathroom. He does not recollect falling, but his wife was there with him. ~2 weeks ago fell and hit is head on concrete. Reports that he was dizzy for the rest of the day and the whole next day, but did not follow up. ~1 week ago fell in gravel onto his right elbow.  Patient wonders if his falls are related to increased shakiness in his hands. Reports that he is not able to write. States that he also has a stutter now and when speaking he forgets midsentence what he is saying. 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 Reports that symptoms of shakiness and speech started 2 months ago.  Reports that he drinks Social alcohol on Wednesday. Denies drug use. Reports that he gets lightheaded often and he is concerned.  When asked about rocking and facial tics, patient stated that a friend commented on it recently. Patient did not seem to notice.   Review of Systems  Skin:  Positive for itching and rash.  Neurological:  Positive for dizziness, tremors, speech change and loss of consciousness.   Objective:  BP (!) 142/77   Pulse 78   Temp 98.3 F (36.8 C)   Ht 5\' 8"  (1.727 m)   Wt 184 lb (83.5 kg)   SpO2 99%   BMI 27.98 kg/m   Orthostatic VS for the past 72 hrs  (Last 3 readings):  Orthostatic BP Patient Position BP Location Cuff Size Orthostatic Pulse  07/25/22 1102 131/81 Standing Right Arm Normal 79  07/25/22 1101 138/87 Sitting Right Arm Normal 75  07/25/22 1100 147/85 Supine Right Arm Normal 73   Physical Exam Constitutional:      General: He is awake. He is not in acute distress.    Appearance: Normal appearance. He is well-developed and well-groomed. He is not ill-appearing, toxic-appearing or diaphoretic.  Cardiovascular:     Rate and Rhythm: Normal rate and regular rhythm.     Pulses: Normal pulses.          Radial pulses are 2+ on the right side and 2+ on the left side.       Posterior tibial pulses are 2+ on the right side and 2+ on the left side.     Heart sounds: Murmur heard.     No gallop.  Pulmonary:     Effort: Pulmonary effort is normal. No respiratory distress.     Breath sounds: Normal breath sounds. No stridor. No wheezing, rhonchi or rales.  Musculoskeletal:     Cervical back: Full passive range of motion without pain and neck supple.     Right lower leg: No edema.  Left lower leg: No edema.  Skin:    General: Skin is warm.     Capillary Refill: Capillary refill takes less than 2 seconds.  Neurological:     General: No focal deficit present.     Mental Status: He is alert, oriented to person, place, and time and easily aroused. Mental status is at baseline.     GCS: GCS eye subscore is 4. GCS verbal subscore is 5. GCS motor subscore is 6.     Cranial Nerves: No facial asymmetry.     Motor: No weakness or pronator drift.     Coordination: Romberg sign positive. Coordination abnormal. Finger-Nose-Finger Test abnormal and Heel to [facility] Test abnormal. Impaired rapid alternating movements.     Gait: Gait abnormal and tandem walk abnormal.     Comments: Facial tics, constant rocking, shuffling feet during exam    Psychiatric:        Attention and Perception: Attention and perception normal.        Mood and Affect:  Affect normal. Mood is anxious.        Behavior: Behavior is hyperactive. Behavior is cooperative.        Thought Content: Thought content normal. Thought content does not include homicidal or suicidal ideation. Thought content does not include homicidal or suicidal plan.        Cognition and Memory: Cognition is impaired. Memory is impaired. He exhibits impaired recent memory.     Comments: Patient stopped sentences frequently and said "see I just forgot where I was going" then would resume what he was explaining.       Assessment & Plan:  1. Falls frequently Urgent referral to Neurology placed. Will collect labs as below. Discussed with patient at length that if he was experiencing signs of a CVA to present to the ED for evaluation. Reviewed signs of CVA with patient.  Reviewed MRI from 11/01/21 - no evidence of acute intracranial abnormality.  Labs as below. Will communicate results to patient once available. Will await results to determine next steps.  Orthostatics negative.  - Anemia Profile B - VITAMIN D 25 Hydroxy (Vit-D Deficiency, Fractures) - CMP14+EGFR - Ambulatory referral to Neurology  2. Syncope and collapse See above. Reviewed note from Cardiology 02/07/22. ILR being monitored by Cardiology. Patient is currently being evaluated for ablation.   3. Muscular dystrophy (HCC) Urgent referral for follow up with neurology.   4. Rash Labs as below. Will communicate results to patient once available. Will await results to determine next steps.  Will treat empirically for folliculitis.  Patient to follow up if does not improve.  - RPR - cephALEXin (KEFLEX) 500 MG capsule; Take 1 capsule (500 mg total) by mouth 4 (four) times daily for 7 days.  Dispense: 28 capsule; Refill: 0  5. Dizziness As above.   6. Murmur, cardiac Patient is established with Cardiology and has ILR. Recommend patient follow up. Referral placed to ensure follow up.  - Ambulatory referral to  Cardiology  The above assessment and management plan was discussed with the patient. The patient verbalized understanding of and has agreed to the management plan using shared-decision making. Patient is aware to call the clinic if they develop any new symptoms or if symptoms fail to improve or worsen. Patient is aware when to return to the clinic for a follow-up visit. Patient educated on when it is appropriate to go to the emergency department.   Return in about 1 week (around 08/01/2022) for with PCP .  Neale Burly, DNP-FNP [facility] [address] [place], [place] [address] [phone]

## 2022-07-26 LAB — ANEMIA PROFILE B
EOS (ABSOLUTE): 0.3 10*3/uL (ref 0.0–0.4)
Hematocrit: 36.6 % — ABNORMAL LOW (ref 37.5–51.0)
Immature Grans (Abs): 0 10*3/uL (ref 0.0–0.1)
Immature Granulocytes: 1 %
MCHC: 33.1 g/dL (ref 31.5–35.7)
MCV: 97 fL (ref 79–97)
Monocytes Absolute: 0.6 10*3/uL (ref 0.1–0.9)
Monocytes: 9 %
Neutrophils: 63 %
Platelets: 236 10*3/uL (ref 150–450)
RBC: 3.78 x10E6/uL — ABNORMAL LOW (ref 4.14–5.80)
Total Iron Binding Capacity: 292 ug/dL (ref 250–450)

## 2022-07-26 LAB — CMP14+EGFR
AST: 29 IU/L (ref 0–40)
Alkaline Phosphatase: 153 IU/L — ABNORMAL HIGH (ref 44–121)
BUN/Creatinine Ratio: 17 (ref 10–24)
BUN: 19 mg/dL (ref 8–27)
Bilirubin Total: 0.4 mg/dL (ref 0.0–1.2)
Calcium: 9.5 mg/dL (ref 8.6–10.2)
Creatinine, Ser: 1.09 mg/dL (ref 0.76–1.27)
Sodium: 140 mmol/L (ref 134–144)
eGFR: 76 mL/min/{1.73_m2} (ref >59)

## 2022-08-05 ENCOUNTER — Encounter: Payer: Self-pay | Admitting: Family Medicine

## 2022-08-05 NOTE — Progress Notes (Signed)
Anemia stable. Iron studies normal. Alk phos slightly elevated. Will continue to monitor on future labs. All other labs normal. Negative for syphilis.

## 2022-08-06 ENCOUNTER — Other Ambulatory Visit: Payer: Self-pay | Admitting: Family Medicine

## 2022-08-06 ENCOUNTER — Ambulatory Visit (INDEPENDENT_AMBULATORY_CARE_PROVIDER_SITE_OTHER): Payer: BC Managed Care – PPO | Admitting: Family Medicine

## 2022-08-06 ENCOUNTER — Encounter: Payer: Self-pay | Admitting: Family Medicine

## 2022-08-06 VITALS — BP 127/72 | HR 81 | Temp 98.9°F | Ht 68.0 in | Wt 181.0 lb

## 2022-08-06 DIAGNOSIS — M25521 Pain in right elbow: Secondary | ICD-10-CM

## 2022-08-06 DIAGNOSIS — R21 Rash and other nonspecific skin eruption: Secondary | ICD-10-CM

## 2022-08-06 DIAGNOSIS — E785 Hyperlipidemia, unspecified: Secondary | ICD-10-CM

## 2022-08-06 MED ORDER — TRIAMCINOLONE ACETONIDE 0.025 % EX OINT: 1.0000 | TOPICAL_OINTMENT | Freq: Two times a day (BID) | CUTANEOUS | 0 refills | Status: DC

## 2022-08-06 NOTE — Progress Notes (Signed)
Acute Office Visit  Subjective:  Patient ID: Johnny Mathews, male    DOB: [birth year], 62 y.o.   MRN: [id number]  Chief Complaint  Patient presents with   Mass    Right elbow, soft to the touch   HPI Patient is in today for swollen elbow. States that it started swelling after he fell in the gravel, but it did not start swelling until after he came to the doctor the last time. Has not tried anything to make it better. Has not noticed anything that makes it worse. States that it is tender to touch.   He is also concerned that his rash has not improved. He has completed his course of antibiotics   ROS As per HPI  Objective:  BP 127/72   Pulse 81   Temp 98.9 F (37.2 C)   Ht 5\' 8"  (1.727 m)   Wt 181 lb (82.1 kg)   SpO2 97%   BMI 27.52 kg/m   Physical Exam Constitutional:      General: He is awake. He is not in acute distress.    Appearance: Normal appearance. He is well-developed and well-groomed. He is not ill-appearing, toxic-appearing or diaphoretic.  Cardiovascular:     Rate and Rhythm: Normal rate and regular rhythm.     Pulses: Normal pulses.          Radial pulses are 2+ on the right side and 2+ on the left side.       Posterior tibial pulses are 2+ on the right side and 2+ on the left side.     Heart sounds: Murmur heard.     No gallop.  Pulmonary:     Effort: Pulmonary effort is normal. No respiratory distress.     Breath sounds: Normal breath sounds. No stridor. No wheezing, rhonchi or rales.  Musculoskeletal:     Cervical back: Full passive range of motion without pain and neck supple.     Right lower leg: No edema.     Left lower leg: No edema.  Skin:    General: Skin is warm.     Capillary Refill: Capillary refill takes less than 2 seconds.          Comments: Swollen right elbow   Neurological:     General: No focal deficit present.     Mental Status: He is alert, oriented to person, place, and time and easily aroused. Mental status is at baseline.      GCS: GCS eye subscore is 4. GCS verbal subscore is 5. GCS motor subscore is 6.     Motor: Tremor present. No weakness.     Comments: Facial ticking, rocking, shuffling during exam   Psychiatric:        Attention and Perception: Attention and perception normal.        Mood and Affect: Mood and affect normal.        Speech: Speech normal.        Behavior: Behavior normal. Behavior is cooperative.        Thought Content: Thought content normal. Thought content does not include homicidal or suicidal ideation. Thought content does not include homicidal or suicidal plan.        Cognition and Memory: Cognition and memory normal.        Judgment: Judgment normal.        08/06/2022    1:24 PM 07/25/2022   10:23 AM 03/13/2022    2:55 PM  Depression screen PHQ 2/9  Decreased  Interest 0 0   Down, Depressed, Hopeless 0 0   PHQ - 2 Score 0 0   Altered sleeping 0 0 0  Tired, decreased energy 0 0 0  Change in appetite 0 0 0  Feeling bad or failure about yourself  0 0 0  Trouble concentrating 0 0 0  Moving slowly or fidgety/restless 0 0 0  Suicidal thoughts 0 0 0  PHQ-9 Score 0 0   Difficult doing work/chores Not difficult at all Not difficult at all Not difficult at all      08/06/2022    1:25 PM 07/25/2022   10:23 AM 03/13/2022    2:54 PM 11/01/2021    9:07 AM  GAD 7 : Generalized Anxiety Score  Nervous, Anxious, on Edge 0 0 0 0  Control/stop worrying 0 0 0 0  Worry too much - different things 0 0 0 0  Trouble relaxing 0 0 0 0  Restless 0 0 0 0  Easily annoyed or irritable 0 0 0   Afraid - awful might happen 0 0 0 0  Total GAD 7 Score 0 0 0   Anxiety Difficulty Not difficult at all Not difficult at all Somewhat difficult    Joint Injection/Arthrocentesis  Date/Time: 08/06/2022 1:52 PM  Performed by: Arrie Senate, FNP Authorized by: Arrie Senate, FNP  Indications: joint swelling  Body area: elbow Joint: right elbow Local anesthesia used: no  Anesthesia: Local  anesthesia used: no  Sedation: Patient sedated: no  Preparation: cleaned with betadine. Needle size: 18 G Ultrasound guidance: no Approach: posterior Aspirate: blood-tinged and serous Aspirate amount: 4 mL Patient tolerance: patient tolerated the procedure well with no immediate complications Comments: Ethylchloride used locally     Assessment & Plan:  1. Right elbow pain Drainage of right elbow bursa completed today with Kari Baars, FNP present. No signs of infection. Discussed signs of infection for patient to monitor. Discussed with patient to follow up as needed.  - Joint Injection/Arthrocentesis  2. Rash Medication as below. Considered chronic urticaria/skin picking disorder, however patient is on multiple medications that have sedation as a side effect and do not want to prescribe additional sedating medications.  - triamcinolone (KENALOG) 0.025 % ointment; Apply 1 Application topically 2 (two) times daily.  Dispense: 30 g; Refill: 0   The above assessment and management plan was discussed with the patient. The patient verbalized understanding of and has agreed to the management plan using shared-decision making. Patient is aware to call the clinic if they develop any new symptoms or if symptoms fail to improve or worsen. Patient is aware when to return to the clinic for a follow-up visit. Patient educated on when it is appropriate to go to the emergency department.   Return if symptoms worsen or fail to improve. Instructed patient he needed to follow up with PCP   Neale Burly, DNP-FNP [facility] [address] [place], [place] [address] [phone]

## 2022-08-07 DIAGNOSIS — I25118 Atherosclerotic heart disease of native coronary artery with other forms of angina pectoris: Secondary | ICD-10-CM | POA: Diagnosis not present

## 2022-08-07 DIAGNOSIS — E785 Hyperlipidemia, unspecified: Secondary | ICD-10-CM | POA: Diagnosis not present

## 2022-08-07 DIAGNOSIS — I480 Paroxysmal atrial fibrillation: Secondary | ICD-10-CM | POA: Diagnosis not present

## 2022-08-07 DIAGNOSIS — I10 Essential (primary) hypertension: Secondary | ICD-10-CM | POA: Diagnosis not present

## 2022-08-07 MED ORDER — PRAVASTATIN SODIUM 80 MG PO TABS: ORAL_TABLET | ORAL | 0 refills | Status: DC

## 2022-08-07 NOTE — Telephone Encounter (Signed)
Refill failed. resent °

## 2022-08-07 NOTE — Addendum Note (Signed)
Addended by: Julious Payer D on: 08/07/2022 08:43 AM   Modules accepted: Orders

## 2022-08-11 ENCOUNTER — Encounter: Payer: Self-pay | Admitting: Family Medicine

## 2022-08-12 ENCOUNTER — Encounter: Payer: Self-pay | Admitting: Nurse Practitioner

## 2022-08-12 ENCOUNTER — Ambulatory Visit: Payer: BC Managed Care – PPO | Admitting: Nurse Practitioner

## 2022-08-12 ENCOUNTER — Ambulatory Visit (INDEPENDENT_AMBULATORY_CARE_PROVIDER_SITE_OTHER): Payer: BC Managed Care – PPO

## 2022-08-12 VITALS — BP 144/93 | HR 100 | Temp 98.3°F | Ht 68.0 in | Wt 179.4 lb

## 2022-08-12 DIAGNOSIS — M62838 Other muscle spasm: Secondary | ICD-10-CM | POA: Diagnosis not present

## 2022-08-12 DIAGNOSIS — M545 Low back pain, unspecified: Secondary | ICD-10-CM

## 2022-08-12 DIAGNOSIS — M47817 Spondylosis without myelopathy or radiculopathy, lumbosacral region: Secondary | ICD-10-CM | POA: Diagnosis not present

## 2022-08-12 DIAGNOSIS — M47816 Spondylosis without myelopathy or radiculopathy, lumbar region: Secondary | ICD-10-CM | POA: Diagnosis not present

## 2022-08-12 DIAGNOSIS — M47818 Spondylosis without myelopathy or radiculopathy, sacral and sacrococcygeal region: Secondary | ICD-10-CM | POA: Diagnosis not present

## 2022-08-12 MED ORDER — CYCLOBENZAPRINE HCL 10 MG PO TABS
10.0000 mg | ORAL_TABLET | Freq: Three times a day (TID) | ORAL | 0 refills | Status: DC | PRN
Start: 2022-08-12 — End: 2022-09-13

## 2022-08-12 MED ORDER — METHYLPREDNISOLONE ACETATE 40 MG/ML IJ SUSP
40.0000 mg | Freq: Once | INTRAMUSCULAR | Status: AC
Start: 2022-08-12 — End: 2022-08-12
  Administered 2022-08-12: 40 mg via INTRAMUSCULAR

## 2022-08-12 NOTE — Progress Notes (Signed)
Established Patient Office Visit  Subjective   Patient ID: Johnny Mathews, male    DOB: 27-Nov-1960  Age: 62 y.o. MRN: [id number]  Chief Complaint  Patient presents with   Back Pain    Had fall last Saturday. Lower back pain and swelling    HPI Johnny Mathews is a 62 y.o. male who complains of an injury causing low back pain 4 day(s) ago. The pain is positional with bending or lifting, without radiation down the legs. Mechanism of injury: fall with no LOC. Symptoms have been acute since that time. Prior history of back problems: recurrent self limited episodes of low back pain in the past. There is no numbness in the legs.    Patient Active Problem List   Diagnosis Date Noted   Recurrent right inguinal hernia    Hiatal hernia 08/27/2020   BPH (benign prostatic hyperplasia) 11/09/2019   Anginal equivalent 01/18/2019   Chronic heart failure with preserved ejection fraction (HCC) 01/18/2019   Osteoarthritis of finger of right hand 07/21/2018   Hypogonadism, male 03/23/2018   Allergy to meat 06/21/2015   Essential hypertension 02/05/2015   Sciatica of left side 10/02/2014   GERD (gastroesophageal reflux disease) 12/23/2012   Hyperlipidemia with target LDL less than 100 12/23/2012   Type 2 diabetes mellitus without complication, without long-term current use of insulin (HCC) 12/23/2012   Asthma, mild intermittent 12/23/2012   Muscular dystrophy (HCC) 04/29/2012   Past Medical History:  Diagnosis Date   Allergy    Alpha galactosidase deficiency    Anemia    past hx    Arthritis    Asthma    Blood transfusion without reported diagnosis    BPH (benign prostatic hypertrophy)    Cataract    removed left eye    Complication of anesthesia    Diabetes mellitus without complication (HCC)    Dysrhythmia    GERD (gastroesophageal reflux disease)    Heart murmur    History of kidney stones    HOH (hard of hearing)    Hyperlipidemia    Hypertension    Irregular heart beat     Muscular dystrophy (HCC)    PONV (postoperative nausea and vomiting)    Sleep apnea    Wears hearing aid in both ears    Past Surgical History:  Procedure Laterality Date   CATARACT EXTRACTION Left    COLONOSCOPY     ESOPHAGOGASTRODUODENOSCOPY N/A 08/27/2020   Procedure: ESOPHAGOGASTRODUODENOSCOPY (EGD);  Surgeon: Corliss Skains, MD;  Location: [facility] OR;  Service: Thoracic;  Laterality: N/A;   FEMUR FRACTURE SURGERY     INGUINAL HERNIA REPAIR Right 08/07/2021   Procedure: HERNIA REPAIR INGUINAL ADULT WITH MESH;  Surgeon: Lucretia Roers, MD;  Location: [facility]S;  Service: General;  Laterality: Right;   INSERTION OF MESH N/A 08/27/2020   Procedure: INSERTION OF ACELL 7.5 x 6cm GENTRIX SURGICAL MATRIX HIATAL MESH;  Surgeon: Corliss Skains, MD;  Location: [facility] OR;  Service: Thoracic;  Laterality: N/A;   KIDNEY STONE SURGERY     x6   KNEE SURGERY Right    x2   MASS EXCISION Right 08/02/2018   Procedure: EXCISION RIGHT LONG FINGER MASS, DEBRIDEMENT PROXIMAL INTERPHALANGEAL JOINT WITH ROTATION FLAP;  Surgeon: Betha Loa, MD;  Location: [facility];  Service: Orthopedics;  Laterality: Right;   NISSEN FUNDOPLICATION     [facility]    SHOULDER OPEN ROTATOR CUFF REPAIR     XI ROBOTIC ASSISTED HIATAL HERNIA REPAIR  N/A 08/27/2020   Procedure: XI ROBOTIC ASSISTED REDO HIATAL HERNIA REPAIR;  Surgeon: Corliss Skains, MD;  Location: [facility] OR;  Service: Thoracic;  Laterality: N/A;   Social History   Tobacco Use   Smoking status: Former    Current packs/day: 0.00    Average packs/day: 1 pack/day for 10.0 years (10.0 ttl pk-yrs)    Types: Cigarettes    Start date: 01/07/1984    Quit date: 01/06/1994    Years since quitting: 28.6   Smokeless tobacco: Current    Types: Snuff  Vaping Use   Vaping status: Never Used  Substance Use Topics   Alcohol use: Yes    Alcohol/week: 2.0 - 3.0 standard drinks of alcohol    Types: 2 - 3 Standard drinks or equivalent per week    Comment:  social   Drug use: No   Social History   Socioeconomic History   Marital status: Married    Spouse name: Not on file   Number of children: Not on file   Years of education: Not on file   Highest education level: Not on file  Occupational History   Occupation: Designer, industrial/product  Tobacco Use   Smoking status: Former    Current packs/day: 0.00    Average packs/day: 1 pack/day for 10.0 years (10.0 ttl pk-yrs)    Types: Cigarettes    Start date: 01/07/1984    Quit date: 01/06/1994    Years since quitting: 28.6   Smokeless tobacco: Current    Types: Snuff  Vaping Use   Vaping status: Never Used  Substance and Sexual Activity   Alcohol use: Yes    Alcohol/week: 2.0 - 3.0 standard drinks of alcohol    Types: 2 - 3 Standard drinks or equivalent per week    Comment: social   Drug use: No   Sexual activity: Not on file  Other Topics Concern   Not on file  Social History Narrative   Not on file   Social Determinants of Health   Financial Resource Strain: Not on file  Food Insecurity: Not on file  Transportation Needs: Not on file  Physical Activity: Not on file  Stress: Not on file  Social Connections: Not on file  Intimate Partner Violence: Not on file   Family Status  Relation Name Status   Mother  Alive   Father  Alive   Sister  Alive   Neg Hx  (Not Specified)  No partnership data on file   Family History  Problem Relation Age of Onset   Hypertension Mother    Breast cancer Mother    Heart disease Father    Breast cancer Sister    Colon cancer Neg Hx    Colon polyps Neg Hx    Esophageal cancer Neg Hx    Rectal cancer Neg Hx    Stomach cancer Neg Hx    Allergies  Allergen Reactions   Egg-Derived Products Swelling    Throat- 03-31-2019 pt states can eat cakes and Pie with no issues  Throat- 03-31-2019 pt states can eat cakes and Pie with no issues    Septra [Sulfamethoxazole-Trimethoprim] Swelling    throat   Sulfonamide Derivatives Hives       ROS Negative unless indicated in HPI   Objective:     BP (!) 144/93   Pulse 100   Temp 98.3 F (36.8 C) (Temporal)   Ht 5\' 8"  (1.727 m)   Wt 179 lb 6.4 oz (81.4 kg)   SpO2  96%   BMI 27.28 kg/m  BP Readings from Last 3 Encounters:  08/12/22 (!) 144/93  08/06/22 127/72  07/25/22 (!) 142/77   Wt Readings from Last 3 Encounters:  08/12/22 179 lb 6.4 oz (81.4 kg)  08/06/22 181 lb (82.1 kg)  07/25/22 184 lb (83.5 kg)      Physical Exam Vital signs as noted above. Patient appears to be in mild to moderate pain, antalgic gait noted. Lumbosacral spine area reveals no local tenderness or mass. Painful and reduced LS ROM noted. Straight leg raise is positive at 45 degrees on right. DTR's, motor strength and sensation normal, including heel and toe gait.  Peripheral pulses are palpable. Lumbar spine X-Ray: ordered, but results not yet available, soft tissue swelling, pending review by radiologist.   No results found for any visits on 08/12/22.  Last CBC Lab Results  Component Value Date   WBC 6.1 07/25/2022   HGB 12.1 (L) 07/25/2022   HCT 36.6 (L) 07/25/2022   MCV 97 07/25/2022   MCH 32.0 07/25/2022   RDW 13.0 07/25/2022   PLT 236 07/25/2022   Last metabolic panel Lab Results  Component Value Date   GLUCOSE 82 07/25/2022   NA 140 07/25/2022   K 4.5 07/25/2022   CL 104 07/25/2022   CO2 22 07/25/2022   BUN 19 07/25/2022   CREATININE 1.09 07/25/2022   EGFR 76 07/25/2022   CALCIUM 9.5 07/25/2022   PROT 6.9 07/25/2022   ALBUMIN 4.5 07/25/2022   LABGLOB 2.4 07/25/2022   AGRATIO 2.0 11/01/2021   BILITOT 0.4 07/25/2022   ALKPHOS 153 (H) 07/25/2022   AST 29 07/25/2022   ALT 26 07/25/2022   ANIONGAP 7 08/28/2020   Last lipids Lab Results  Component Value Date   CHOL 165 06/28/2021   HDL 59 06/28/2021   LDLCALC 86 06/28/2021   TRIG 110 06/28/2021   CHOLHDL 2.8 06/28/2021   Last hemoglobin A1c Lab Results  Component Value Date   HGBA1C 5.2 03/13/2022   Last  thyroid functions Lab Results  Component Value Date   TSH 1.090 11/01/2021   T4TOTAL 4.8 06/18/2015        Assessment & Plan:  Acute low back pain, unspecified back pain laterality, unspecified whether sciatica present -     DG Lumbar Spine 2-3 Views -     Cyclobenzaprine HCl; Take 1 tablet (10 mg total) by mouth 3 (three) times daily as needed for muscle spasms (pain). (NEEDS TO BE SEEN BEFORE NEXT REFILL)  Dispense: 30 tablet; Refill: 0 -     methylPREDNISolone Acetate  Muscle spasm -     Cyclobenzaprine HCl; Take 1 tablet (10 mg total) by mouth 3 (three) times daily as needed for muscle spasms (pain). (NEEDS TO BE SEEN BEFORE NEXT REFILL)  Dispense: 30 tablet; Refill: 0 -     methylPREDNISolone Acetate  Elyjah was seen today for back pain no acute distress, lumbar strain X-rau awaiting final reports from radiologist IM injection methylprednisolone  For acute pain, rest, intermittent application of cold packs (later, may switch to heat, but do not sleep on heating pad), analgesics and muscle relaxants are recommended. Discussed longer term treatment plan of prn NSAID's and discussed a home back care exercise program with flexion exercise routine. Proper lifting with avoidance of heavy lifting discussed. Consider Physical Therapy and XRay studies if not improving. Call or return to clinic prn if these symptoms worsen or fail to improve as anticipated.   Return if symptoms worsen or  fail to improve.    Arrie Aran Santa Lighter, DNP [facility] [address] [place], [place] [address] [phone]

## 2022-08-16 DIAGNOSIS — Z95818 Presence of other cardiac implants and grafts: Secondary | ICD-10-CM | POA: Diagnosis not present

## 2022-08-19 ENCOUNTER — Other Ambulatory Visit: Payer: Self-pay | Admitting: Nurse Practitioner

## 2022-08-19 DIAGNOSIS — M62838 Other muscle spasm: Secondary | ICD-10-CM

## 2022-08-19 DIAGNOSIS — M545 Low back pain, unspecified: Secondary | ICD-10-CM

## 2022-08-19 MED ORDER — DICLOFENAC SODIUM 1 % EX GEL
2.0000 g | Freq: Four times a day (QID) | CUTANEOUS | 2 refills | Status: DC
Start: 2022-08-19 — End: 2023-03-24

## 2022-08-22 ENCOUNTER — Other Ambulatory Visit: Payer: Self-pay | Admitting: Family Medicine

## 2022-08-25 ENCOUNTER — Encounter: Payer: Self-pay | Admitting: Neurology

## 2022-08-25 ENCOUNTER — Ambulatory Visit: Payer: BC Managed Care – PPO | Admitting: Neurology

## 2022-08-25 VITALS — BP 110/71 | HR 131 | Ht 68.0 in | Wt 178.0 lb

## 2022-08-25 DIAGNOSIS — G713 Mitochondrial myopathy, not elsewhere classified: Secondary | ICD-10-CM

## 2022-08-25 DIAGNOSIS — G7100 Muscular dystrophy, unspecified: Secondary | ICD-10-CM

## 2022-08-25 NOTE — Progress Notes (Unsigned)
Follow-up Visit   Date: 08/25/2022    CORDE MOURER MRN: [id number] DOB: [birth year]    Johnny Mathews is a 62 y.o. Caucasian male with hypertension, hyperlipidemia, arthritis, asthma, and mitochondrial disease returning to the clinic for follow-up of speech change.  The patient was accompanied to the clinic by self.  IMPRESSION/PLAN: Spells of expressive aphasia and slurred speech.  Per patient, MRI brain was normal performed at [facility]. He has motor impersistence, it's possible speech may be associated with this. I will refer him for out-patient speech therapy.   2.   Mitochondrial myopathy, cramps.  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  He was followed by Dr. Blain Pais for many years.                - Continue amitriptyline 100mg  at bedtime, prescribed by PCP                - Continue supportive care                - He would like to reestablish himself at [facility] clinic. Referral will be placed.    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  He has a long history of muscle cramps and diagnosed with mitochondial myopathy in 1992 at [facility] by muscle biopsy.  Enzymatic testing was not done.  [facility] Genetic testing in 2014 showed PLEX and TTN variants of unknown significance, concluded to be benign. CK was mildly elevated 300-400.  He has been managed most recently by Dr. Blain Pais at the [facility] at [facility], last seen in 2018.  He has tried a number of medications and supplements for cramps, and currently takes amitriptyline 50mg  at bedtime.  He is unsure how much benefit he gets from this, because he continues to have painful cramps, especially in the feet. He had the best response to [facility], but it is too expensive. He often stretches/walks to get relief.  He also has  hyperkinetic movements of the face and limbs.   Over the past year, he is having increased falls and leg weakness.  He walks unassisted and works full-time. He also complains of numbness in his upper back.  MRI cervical spine from May 2019 shows moderate spinal stenosis at C5-6 and foraminal stenosis at this level.  He has not done PT.  He denies speech/swallow problems.    UPDATE 08/25/2022:  Over the past month, he has noticed that he is struggling to his words out.  Sometimes the words will not come out, other times he slurs the works.  He had MRI brain at [facility] which was normal.  He sometimes gets frustrated when talking to people.   Medications:  Current Outpatient Medications on File Prior to Visit  Medication Sig Dispense Refill   albuterol (PROVENTIL) (2.5 MG/3ML) 0.083% nebulizer solution NEBULIZE 1 VIAL EVERY 6 HOURS AS NEEDED FOR WHEEZING & SHORTNESS OF BREATH 150 mL 0   albuterol (VENTOLIN HFA) 108 (90 Base) MCG/ACT inhaler INHALE 2 PUFFS EVERY 4 HOURS AS NEEDED 8.5 g 2   amitriptyline (ELAVIL) 100 MG tablet TAKE 2 TABLETS BY MOUTH AT BEDTIME 60 tablet 3   azelastine (ASTELIN) 0.1 % nasal spray Use in each nostril as directed 30 mL 5   blood glucose meter kit and supplies Dispense based on patient and insurance preference. Use up to four times daily as directed. (FOR ICD-10  E10.9, E11.9). Pt states needs One Touch Verio Meter and one touch delica plus test strips 1 each 0   Blood Glucose Monitoring Suppl (CONTOUR BLOOD GLUCOSE SYSTEM) w/Device KIT Test blood sugars four times daily 200 each 5   BREZTRI AEROSPHERE 160-9-4.8 MCG/ACT AERO USE 2 PUFFS EVERY MORNING AND AT BEDTIME 10.7 g 5   cyclobenzaprine (FLEXERIL) 10 MG tablet Take 1 tablet (10 mg total) by mouth 3 (three) times daily as needed for muscle spasms (pain). (NEEDS TO BE SEEN BEFORE NEXT REFILL) 30 tablet 0   diclofenac Sodium (VOLTAREN) 1 % GEL Apply 2 g topically 4 (four) times daily. 100 g 2   EPINEPHRINE 0.3  mg/0.3 mL IJ SOAJ injection INJECT 0.3ML (0.3MG ) IM ONCE 2 each 1   famotidine (PEPCID) 20 MG tablet TAKE ONE (1) TABLET BY MOUTH TWO (2) TIMES DAILY 180 tablet 0   ferrous sulfate 325 (65 FE) MG tablet Take 325 mg by mouth daily with breakfast.     finasteride (PROSCAR) 5 MG tablet Take one tablet daily 90 tablet 3   fluticasone (FLONASE) 50 MCG/ACT nasal spray USE 1 TO 2 SPRAYS IN EACH NOSTRIL DAILY 48 g 1   furosemide (LASIX) 20 MG tablet TAKE 1 TABLET BY MOUTH DAILY AS NEEDED 90 tablet 1   glucose blood (CONTOUR NEXT TEST) test strip Test blood sugars four times daily 200 each 5   ipratropium (ATROVENT) 0.06 % nasal spray 2 sprays into each nostril Three (3) times a day.     Lancets (ONETOUCH DELICA PLUS LANCET33G) MISC      lisdexamfetamine (VYVANSE) 30 MG capsule Take by mouth.     losartan (COZAAR) 50 MG tablet TAKE ONE (1) TABLET BY MOUTH EVERY DAY 90 tablet 1   meloxicam (MOBIC) 7.5 MG tablet TAKE ONE (1) TABLET EACH DAY 90 tablet 1   montelukast (SINGULAIR) 10 MG tablet TAKE ONE TABLET DAILY AT BEDTIME 90 tablet 1   pravastatin (PRAVACHOL) 80 MG tablet TAKE ONE (1) TABLET EACH DAY 90 tablet 0   Spacer/Aero-Holding Chambers DEVI Use with inhaler daily 1 each 0   SYRINGE-NEEDLE, DISP, 3 ML 21G X 1-1/2" 3 ML MISC Use to inject testosterone every week 50 each 0   tadalafil (CIALIS) 10 MG tablet Take 1 tablet (10 mg total) by mouth every other day as needed for erectile dysfunction. 30 tablet 1   tamsulosin (FLOMAX) 0.4 MG CAPS capsule TAKE 1 CAPSULE DAILY AFTER SUPPER 90 capsule 1   testosterone cypionate (DEPOTESTOSTERONE CYPIONATE) 200 MG/ML injection Inject 100 mg (0.5 ml ) every 10 days . For IM use only. 10 mL 0   triamcinolone (KENALOG) 0.025 % ointment Apply 1 Application topically 2 (two) times daily. 30 g 0   No current facility-administered medications on file prior to visit.    Allergies:  Allergies  Allergen Reactions   Egg-Derived Products Swelling    Throat- 03-31-2019  pt states can eat cakes and Pie with no issues  Throat- 03-31-2019 pt states can eat cakes and Pie with no issues    Septra [Sulfamethoxazole-Trimethoprim] Swelling    throat   Sulfonamide Derivatives Hives    Vital Signs:  BP 110/71   Pulse (!) 131   Ht 5\' 8"  (1.727 m)   Wt 178 lb (80.7 kg)   SpO2 98%   BMI 27.06 kg/m     Neurological Exam: MENTAL STATUS including orientation to time, place, person, recent and remote memory, attention span and concentration, language, and fund of knowledge  is fair.  There is some hesitation in speech, no dysarthria.  He is able to enunciate lingual and gutteral sounds well.  CRANIAL NERVES:  No visual field defects.  Pupils equal round and reactive to light.  Normal conjugate, extra-ocular eye movements in all directions of gaze.  No ptosis.  Face is symmetric. Palate elevates symmetrically.  Tongue is midline.  Prominent facial tics.  MOTOR:  Motor impersistence of the arms and legs. Mild intrinsic hand muscle atrophy.  No fasciculations or abnormal movements.  No pronator drift.    Upper Extremity:  Right   Left  Deltoid  5/5    5/5   Biceps  5/5    5/5   Triceps  5/5    5/5   Infraspinatus 5/5   5/5  Medial pectoralis 5/5   5/5  Wrist extensors  5/5    5/5   Wrist flexors  5/5    5/5   Finger extensors  5-/5    5-/5   Finger flexors  5-/5    5-/5   Dorsal interossei  5-/5    5-/5   Abductor pollicis  5-/5    5-/5   Tone (Ashworth scale)  0   0    Lower Extremity:  Right   Left  Hip flexors  5-/5    5-/5   Hip extensors  5/5    5/5   Adductor 5/5   5/5  Abductor 5/5   5/5  Knee flexors  5/5    5/5   Knee extensors  5/5    5/5   Dorsiflexors  5/5    5/5   Plantarflexors  5/5    5/5   Toe extensors  5/5    5/5   Toe flexors  5/5    5/5   Tone (Ashworth scale)  0   0    MSRs:     Right         Left                  brachioradialis 1+   1+  biceps 1+   1+  triceps 1+   1+  patellar 1+   1+  ankle jerk 1+   1+  Hoffman no   no   plantar response down   down    SENSORY:  Normal and symmetric perception of light touch, pinprick, vibration, and proprioception.  Romberg's sign absent.    COORDINATION/GAIT: Normal finger-to- nose-finger.  Intact rapid alternating movements bilaterally.   Gait narrow based and stable.   Data:n/a   Thank you for allowing me to participate in patient's care.  If I can answer any additional questions, I would be pleased to do so.    Sincerely,    Jisele Price K. Allena Katz, DO

## 2022-08-26 ENCOUNTER — Encounter: Payer: Self-pay | Admitting: Family Medicine

## 2022-09-11 DIAGNOSIS — Z79899 Other long term (current) drug therapy: Secondary | ICD-10-CM | POA: Diagnosis not present

## 2022-09-11 DIAGNOSIS — F902 Attention-deficit hyperactivity disorder, combined type: Secondary | ICD-10-CM | POA: Diagnosis not present

## 2022-09-12 DIAGNOSIS — F902 Attention-deficit hyperactivity disorder, combined type: Secondary | ICD-10-CM | POA: Diagnosis not present

## 2022-09-13 ENCOUNTER — Ambulatory Visit
Admission: RE | Admit: 2022-09-13 | Discharge: 2022-09-13 | Disposition: A | Discharge status: Home / Self Care | Payer: BC Managed Care – PPO | Source: Ambulatory Visit | Attending: Family Medicine | Admitting: Family Medicine

## 2022-09-13 ENCOUNTER — Ambulatory Visit: Payer: BC Managed Care – PPO

## 2022-09-13 ENCOUNTER — Other Ambulatory Visit: Payer: Self-pay

## 2022-09-13 VITALS — BP 111/75 | HR 133 | Temp 98.2°F | Resp 20

## 2022-09-13 DIAGNOSIS — R0781 Pleurodynia: Secondary | ICD-10-CM

## 2022-09-13 DIAGNOSIS — R079 Chest pain, unspecified: Secondary | ICD-10-CM | POA: Diagnosis not present

## 2022-09-13 MED ORDER — DICLOFENAC SODIUM 75 MG PO TBEC: 75.0000 mg | DELAYED_RELEASE_TABLET | Freq: Two times a day (BID) | ORAL | 0 refills | Status: DC

## 2022-09-13 MED ORDER — CYCLOBENZAPRINE HCL 10 MG PO TABS: ORAL_TABLET | ORAL | 0 refills | Status: DC

## 2022-09-13 NOTE — ED Triage Notes (Addendum)
Pt reports slipped down front porch steps when it was raining approximately 2 weeks ago. Pt reports was seen at pcp right after fall and was given a "shot and muscle relaxers" and states it "isn't helping."  Reports intermittent radiation of pain in RLE. Denies any gi/gu symptoms.

## 2022-09-15 ENCOUNTER — Other Ambulatory Visit: Payer: Self-pay | Admitting: Family Medicine

## 2022-09-15 ENCOUNTER — Encounter: Payer: Self-pay | Admitting: Family Medicine

## 2022-09-16 ENCOUNTER — Telehealth: Payer: Self-pay

## 2022-09-16 DIAGNOSIS — I4891 Unspecified atrial fibrillation: Secondary | ICD-10-CM | POA: Diagnosis not present

## 2022-09-16 DIAGNOSIS — Z95818 Presence of other cardiac implants and grafts: Secondary | ICD-10-CM | POA: Diagnosis not present

## 2022-09-16 DIAGNOSIS — Z7901 Long term (current) use of anticoagulants: Secondary | ICD-10-CM | POA: Diagnosis not present

## 2022-09-16 DIAGNOSIS — Z4509 Encounter for adjustment and management of other cardiac device: Secondary | ICD-10-CM | POA: Diagnosis not present

## 2022-09-16 NOTE — Telephone Encounter (Signed)
(  Key: [id number])  Ozempic 1mg   form thumbnail Your information has been sent to [facility].

## 2022-09-16 NOTE — Telephone Encounter (Signed)
Pharmacy Patient Advocate Encounter  Received notification from [facility] that Prior Authorization for Ozempic (1 MG/DOSE) 4MG /3ML pen-injectors has been APPROVED from 09/16/22 to 09/16/23   PA #/Case ID/Reference #: [id number]

## 2022-09-17 NOTE — ED Provider Notes (Signed)
[facility]   [id number] 09/13/22 Arrival Time: 0857  ASSESSMENT & PLAN:  1. Rib pain on right side    I have personally viewed and independently interpreted the imaging studies ordered this visit. No acute rib fx appreciated. No PTX.  DG Ribs Unilateral W/Chest Right  Result Date: 09/13/2022 CLINICAL DATA:  62 year old male with history of lower chest pain after a fall onto his back. EXAM: RIGHT RIBS AND CHEST - 3+ VIEW COMPARISON:  Chest x-ray 09/26/2020. FINDINGS: Lung volumes are normal. No consolidative airspace disease. No pleural effusions. No pneumothorax. No pulmonary nodule or mass noted. Pulmonary vasculature and the cardiomediastinal silhouette are within normal limits. Electronic device projecting over the heart, likely an implantable pacemaker. Dedicated views of the right ribs demonstrate an old healed fracture of the posterolateral right seventh rib. No other acute displaced fractures are noted. IMPRESSION: 1. No acute displaced right-sided rib fractures. 2. Old healed right posterolateral seventh rib fracture. 3. No radiographic evidence of acute cardiopulmonary disease. Electronically Signed   By: Trudie Reed M.D.   On: 09/13/2022 10:45    Trial of: Meds ordered this encounter  Medications   diclofenac (VOLTAREN) 75 MG EC tablet    Sig: Take 1 tablet (75 mg total) by mouth 2 (two) times daily.    Dispense:  14 tablet    Refill:  0   cyclobenzaprine (FLEXERIL) 10 MG tablet    Sig: Take 1 tablet by mouth 3 times daily as needed for muscle spasm. Warning: May cause drowsiness.    Dispense:  21 tablet    Refill:  0    Chest pain precautions given. Reviewed expectations re: course of current medical issues. Questions answered. Outlined signs and symptoms indicating need for more acute intervention. Patient verbalized understanding. After Visit Summary given.   SUBJECTIVE:  History from: patient. Johnny Mathews is a 62 y.o. male who presents with  complaint of slipping down front porch steps when it was raining approximately 2 weeks ago. Pt reports was seen at pcp right after fall and was given a "shot and muscle relaxers" and states it "isn't helping."  Reports intermittent radiation of pain in RLE. Denies any gi/gu symptoms. Denies SOB.   Social History   Tobacco Use  Smoking Status Former   Current packs/day: 0.00   Average packs/day: 1 pack/day for 10.0 years (10.0 ttl pk-yrs)   Types: Cigarettes   Start date: 01/07/1984   Quit date: 01/06/1994   Years since quitting: 28.7  Smokeless Tobacco Current   Types: Snuff   Social History   Substance and Sexual Activity  Alcohol Use Yes   Alcohol/week: 2.0 - 3.0 standard drinks of alcohol   Types: 2 - 3 Standard drinks or equivalent per week   Comment: social    OBJECTIVE:  Vitals:   09/13/22 0920  BP: 111/75  Pulse: (!) 133  Resp: 20  Temp: 98.2 F (36.8 C)  TempSrc: Oral  SpO2: 98%    General appearance: alert, oriented, no acute distress Eyes: PERRLA; EOMI; conjunctivae normal HENT: normocephalic; atraumatic Neck: supple with FROM Lungs: without labored respirations; speaks full sentences without difficulty; CTAB Heart: regular rate and rhythm Chest Wall: with tenderness to palpation of R posterior ribs Abdomen: soft, non-tender; no guarding or rebound tenderness Extremities: with edema; without calf swelling or tenderness; symmetrical without gross deformities Skin: warm and dry; without rash or lesions Neuro: normal gait Psychological: alert and cooperative; normal mood and affect    Allergies  Allergen  Reactions   Egg-Derived Products Swelling    Throat- 03-31-2019 pt states can eat cakes and Pie with no issues  Throat- 03-31-2019 pt states can eat cakes and Pie with no issues    Septra [Sulfamethoxazole-Trimethoprim] Swelling    throat   Sulfonamide Derivatives Hives    Past Medical History:  Diagnosis Date   Allergy    Alpha galactosidase  deficiency    Anemia    past hx    Arthritis    Asthma    Blood transfusion without reported diagnosis    BPH (benign prostatic hypertrophy)    Cataract    removed left eye    Complication of anesthesia    Diabetes mellitus without complication (HCC)    Dysrhythmia    GERD (gastroesophageal reflux disease)    Heart murmur    History of kidney stones    HOH (hard of hearing)    Hyperlipidemia    Hypertension    Irregular heart beat    Muscular dystrophy (HCC)    PONV (postoperative nausea and vomiting)    Sleep apnea    Wears hearing aid in both ears    Social History   Socioeconomic History   Marital status: Married    Spouse name: Not on file   Number of children: Not on file   Years of education: Not on file   Highest education level: Not on file  Occupational History   Occupation: Designer, industrial/product  Tobacco Use   Smoking status: Former    Current packs/day: 0.00    Average packs/day: 1 pack/day for 10.0 years (10.0 ttl pk-yrs)    Types: Cigarettes    Start date: 01/07/1984    Quit date: 01/06/1994    Years since quitting: 28.7   Smokeless tobacco: Current    Types: Snuff  Vaping Use   Vaping status: Never Used  Substance and Sexual Activity   Alcohol use: Yes    Alcohol/week: 2.0 - 3.0 standard drinks of alcohol    Types: 2 - 3 Standard drinks or equivalent per week    Comment: social   Drug use: No   Sexual activity: Not on file  Other Topics Concern   Not on file  Social History Narrative   Not on file   Social Determinants of Health   Financial Resource Strain: Not on file  Food Insecurity: Not on file  Transportation Needs: Not on file  Physical Activity: Not on file  Stress: Not on file  Social Connections: Not on file  Intimate Partner Violence: Not on file   Family History  Problem Relation Age of Onset   Hypertension Mother    Breast cancer Mother    Heart disease Father    Breast cancer Sister    Colon cancer Neg Hx    Colon polyps  Neg Hx    Esophageal cancer Neg Hx    Rectal cancer Neg Hx    Stomach cancer Neg Hx    Past Surgical History:  Procedure Laterality Date   CATARACT EXTRACTION Left    COLONOSCOPY     ESOPHAGOGASTRODUODENOSCOPY N/A 08/27/2020   Procedure: ESOPHAGOGASTRODUODENOSCOPY (EGD);  Surgeon: Corliss Skains, MD;  Location: [facility];  Service: Thoracic;  Laterality: N/A;   FEMUR FRACTURE SURGERY     INGUINAL HERNIA REPAIR Right 08/07/2021   Procedure: HERNIA REPAIR INGUINAL ADULT WITH MESH;  Surgeon: Lucretia Roers, MD;  Location: [facility];  Service: General;  Laterality: Right;   INSERTION OF MESH N/A 08/27/2020  Procedure: INSERTION OF ACELL 7.5 x 6cm GENTRIX SURGICAL MATRIX HIATAL MESH;  Surgeon: Corliss Skains, MD;  Location: [facility] OR;  Service: Thoracic;  Laterality: N/A;   KIDNEY STONE SURGERY     x6   KNEE SURGERY Right    x2   MASS EXCISION Right 08/02/2018   Procedure: EXCISION RIGHT LONG FINGER MASS, DEBRIDEMENT PROXIMAL INTERPHALANGEAL JOINT WITH ROTATION FLAP;  Surgeon: Betha Loa, MD;  Location: [facility];  Service: Orthopedics;  Laterality: Right;   NISSEN FUNDOPLICATION     [facility]    SHOULDER OPEN ROTATOR CUFF REPAIR     XI ROBOTIC ASSISTED HIATAL HERNIA REPAIR N/A 08/27/2020   Procedure: XI ROBOTIC ASSISTED REDO HIATAL HERNIA REPAIR;  Surgeon: Corliss Skains, MD;  Location: [facility] OR;  Service: Thoracic;  Laterality: Vertis Kelch, MD 09/17/22 [phone]

## 2022-09-22 ENCOUNTER — Other Ambulatory Visit: Payer: Self-pay | Admitting: Family Medicine

## 2022-09-22 MED ORDER — SEMAGLUTIDE (1 MG/DOSE) 4 MG/3ML ~~LOC~~ SOPN: 1.0000 mg | PEN_INJECTOR | SUBCUTANEOUS | 5 refills | Status: DC

## 2022-09-29 DIAGNOSIS — I480 Paroxysmal atrial fibrillation: Secondary | ICD-10-CM | POA: Diagnosis not present

## 2022-09-29 DIAGNOSIS — I10 Essential (primary) hypertension: Secondary | ICD-10-CM | POA: Diagnosis not present

## 2022-09-29 DIAGNOSIS — I4819 Other persistent atrial fibrillation: Secondary | ICD-10-CM | POA: Diagnosis not present

## 2022-10-02 DIAGNOSIS — I10 Essential (primary) hypertension: Secondary | ICD-10-CM | POA: Diagnosis not present

## 2022-10-16 ENCOUNTER — Other Ambulatory Visit: Payer: Self-pay | Admitting: Family Medicine

## 2022-10-16 DIAGNOSIS — G4709 Other insomnia: Secondary | ICD-10-CM

## 2022-10-16 DIAGNOSIS — E785 Hyperlipidemia, unspecified: Secondary | ICD-10-CM

## 2022-10-17 DIAGNOSIS — Z882 Allergy status to sulfonamides status: Secondary | ICD-10-CM | POA: Diagnosis not present

## 2022-10-17 DIAGNOSIS — Z1152 Encounter for screening for COVID-19: Secondary | ICD-10-CM | POA: Diagnosis not present

## 2022-10-17 DIAGNOSIS — Z79899 Other long term (current) drug therapy: Secondary | ICD-10-CM | POA: Diagnosis not present

## 2022-10-17 DIAGNOSIS — I480 Paroxysmal atrial fibrillation: Secondary | ICD-10-CM | POA: Diagnosis not present

## 2022-10-17 DIAGNOSIS — I5032 Chronic diastolic (congestive) heart failure: Secondary | ICD-10-CM | POA: Diagnosis not present

## 2022-10-17 DIAGNOSIS — R9431 Abnormal electrocardiogram [ECG] [EKG]: Secondary | ICD-10-CM | POA: Diagnosis not present

## 2022-10-17 DIAGNOSIS — Z4509 Encounter for adjustment and management of other cardiac device: Secondary | ICD-10-CM | POA: Diagnosis not present

## 2022-10-17 DIAGNOSIS — N189 Chronic kidney disease, unspecified: Secondary | ICD-10-CM | POA: Diagnosis not present

## 2022-10-17 DIAGNOSIS — E1122 Type 2 diabetes mellitus with diabetic chronic kidney disease: Secondary | ICD-10-CM | POA: Diagnosis not present

## 2022-10-17 DIAGNOSIS — E785 Hyperlipidemia, unspecified: Secondary | ICD-10-CM | POA: Diagnosis not present

## 2022-10-17 DIAGNOSIS — I130 Hypertensive heart and chronic kidney disease with heart failure and stage 1 through stage 4 chronic kidney disease, or unspecified chronic kidney disease: Secondary | ICD-10-CM | POA: Diagnosis not present

## 2022-10-17 DIAGNOSIS — Z95818 Presence of other cardiac implants and grafts: Secondary | ICD-10-CM | POA: Diagnosis not present

## 2022-11-03 DIAGNOSIS — E291 Testicular hypofunction: Secondary | ICD-10-CM | POA: Diagnosis not present

## 2022-11-06 LAB — CBC WITH DIFFERENTIAL/PLATELET
Basophils Absolute: 0 10*3/uL (ref 0.0–0.2)
Basos: 0 %
EOS (ABSOLUTE): 0.4 10*3/uL (ref 0.0–0.4)
Eos: 6 %
Hematocrit: 37.9 % (ref 37.5–51.0)
Hemoglobin: 12.2 g/dL — ABNORMAL LOW (ref 13.0–17.7)
Immature Grans (Abs): 0 10*3/uL (ref 0.0–0.1)
Immature Granulocytes: 0 %
Lymphocytes Absolute: 1.1 10*3/uL (ref 0.7–3.1)
Lymphs: 16 %
MCH: 31.9 pg (ref 26.6–33.0)
MCHC: 32.2 g/dL (ref 31.5–35.7)
MCV: 99 fL — ABNORMAL HIGH (ref 79–97)
Monocytes Absolute: 0.6 10*3/uL (ref 0.1–0.9)
Monocytes: 9 %
Neutrophils Absolute: 5 10*3/uL (ref 1.4–7.0)
Neutrophils: 69 %
Platelets: 281 10*3/uL (ref 150–450)
RBC: 3.83 x10E6/uL — ABNORMAL LOW (ref 4.14–5.80)
RDW: 12.3 % (ref 11.6–15.4)
WBC: 7.2 10*3/uL (ref 3.4–10.8)

## 2022-11-06 LAB — TESTOSTERONE, FREE, TOTAL, SHBG
Sex Hormone Binding: 77.1 nmol/L — ABNORMAL HIGH (ref 19.3–76.4)
Testosterone, Free: 4.9 pg/mL — ABNORMAL LOW (ref 6.6–18.1)
Testosterone: 603 ng/dL (ref 264–916)

## 2022-11-07 ENCOUNTER — Ambulatory Visit: Payer: BC Managed Care – PPO | Admitting: Family Medicine

## 2022-11-07 HISTORY — PX: CARDIAC ELECTROPHYSIOLOGY STUDY AND ABLATION: SHX1294

## 2022-11-10 ENCOUNTER — Other Ambulatory Visit: Payer: Self-pay

## 2022-11-10 ENCOUNTER — Emergency Department (HOSPITAL_COMMUNITY)
Admission: EM | Admit: 2022-11-10 | Discharge: 2022-11-10 | Disposition: A | Discharge status: Home / Self Care | Payer: BC Managed Care – PPO | Attending: Emergency Medicine | Admitting: Emergency Medicine

## 2022-11-10 ENCOUNTER — Encounter (HOSPITAL_COMMUNITY): Payer: Self-pay

## 2022-11-10 ENCOUNTER — Encounter: Payer: Self-pay | Admitting: "Endocrinology

## 2022-11-10 ENCOUNTER — Ambulatory Visit: Payer: BC Managed Care – PPO | Admitting: "Endocrinology

## 2022-11-10 ENCOUNTER — Emergency Department (HOSPITAL_COMMUNITY): Payer: BC Managed Care – PPO

## 2022-11-10 VITALS — BP 122/84 | HR 120 | Ht 68.0 in | Wt 173.0 lb

## 2022-11-10 DIAGNOSIS — R002 Palpitations: Secondary | ICD-10-CM | POA: Diagnosis not present

## 2022-11-10 DIAGNOSIS — R030 Elevated blood-pressure reading, without diagnosis of hypertension: Secondary | ICD-10-CM

## 2022-11-10 DIAGNOSIS — I4891 Unspecified atrial fibrillation: Secondary | ICD-10-CM | POA: Insufficient documentation

## 2022-11-10 DIAGNOSIS — R000 Tachycardia, unspecified: Secondary | ICD-10-CM | POA: Diagnosis not present

## 2022-11-10 DIAGNOSIS — I10 Essential (primary) hypertension: Secondary | ICD-10-CM | POA: Insufficient documentation

## 2022-11-10 DIAGNOSIS — E291 Testicular hypofunction: Secondary | ICD-10-CM | POA: Diagnosis not present

## 2022-11-10 DIAGNOSIS — E782 Mixed hyperlipidemia: Secondary | ICD-10-CM | POA: Diagnosis not present

## 2022-11-10 DIAGNOSIS — Z79899 Other long term (current) drug therapy: Secondary | ICD-10-CM | POA: Diagnosis not present

## 2022-11-10 LAB — CBC
HCT: 39 % (ref 39.0–52.0)
Hemoglobin: 12.5 g/dL — ABNORMAL LOW (ref 13.0–17.0)
MCH: 31.7 pg (ref 26.0–34.0)
MCHC: 32.1 g/dL (ref 30.0–36.0)
MCV: 99 fL (ref 80.0–100.0)
Platelets: 263 10*3/uL (ref 150–400)
RBC: 3.94 MIL/uL — ABNORMAL LOW (ref 4.22–5.81)
RDW: 13.2 % (ref 11.5–15.5)
WBC: 6.7 10*3/uL (ref 4.0–10.5)
nRBC: 0 % (ref 0.0–0.2)

## 2022-11-10 LAB — BASIC METABOLIC PANEL
Anion gap: 8 (ref 5–15)
BUN: 17 mg/dL (ref 8–23)
CO2: 24 mmol/L (ref 22–32)
Calcium: 9.4 mg/dL (ref 8.9–10.3)
Chloride: 105 mmol/L (ref 98–111)
Creatinine, Ser: 1.12 mg/dL (ref 0.61–1.24)
GFR, Estimated: >60 mL/min (ref >60)
Glucose, Bld: 78 mg/dL (ref 70–99)
Potassium: 4.6 mmol/L (ref 3.5–5.1)
Sodium: 137 mmol/L (ref 135–145)

## 2022-11-10 MED ORDER — TESTOSTERONE CYPIONATE 200 MG/ML IM SOLN: INTRAMUSCULAR | 0 refills | Status: DC

## 2022-11-10 MED ORDER — DILTIAZEM HCL-DEXTROSE 125-5 MG/125ML-% IV SOLN (PREMIX)
5.0000 mg/h | INTRAVENOUS | Status: DC
  Administered 2022-11-10: 5 mg/h via INTRAVENOUS
  Filled 2022-11-10: qty 125

## 2022-11-10 MED ORDER — DILTIAZEM LOAD VIA INFUSION
20.0000 mg | Freq: Once | INTRAVENOUS | Status: AC
  Administered 2022-11-10: 20 mg via INTRAVENOUS
  Filled 2022-11-10: qty 20

## 2022-11-10 MED ORDER — PROPOFOL 10 MG/ML IV BOLUS
30.0000 mg | Freq: Once | INTRAVENOUS | Status: AC
  Administered 2022-11-10: 30 mg via INTRAVENOUS

## 2022-11-10 MED ORDER — PROPOFOL 10 MG/ML IV BOLUS
0.5000 mg/kg | Freq: Once | INTRAVENOUS | Status: AC
  Administered 2022-11-10: 40.2 mg via INTRAVENOUS
  Filled 2022-11-10: qty 20

## 2022-11-10 NOTE — ED Provider Notes (Signed)
[place] EMERGENCY DEPARTMENT AT [facility] Provider Note   CSN: [id number] Arrival date & time: 11/10/22  1100     History  Chief Complaint  Patient presents with   Tachycardia    KACIN DANCY is a 62 y.o. male.  Pt with hx afib, indicates was at pcp visit for routine f/u appointment, when was noted to have heart rate in 120s and was sent to ED for eval. Pt denies sense of palpitations. No chest pain or discomfort. No sob or unusual doe. Is on eliquis, and indicates compliant w rx. Denies other change in meds. No fever or chills. Did not feel acutely or sick today.   The history is provided by the patient and medical records.       Home Medications Prior to Admission medications   Medication Sig Start Date End Date Taking? Authorizing Provider  albuterol (PROVENTIL) (2.5 MG/3ML) 0.083% nebulizer solution NEBULIZE 1 VIAL EVERY 6 HOURS AS NEEDED FOR WHEEZING & SHORTNESS OF BREATH 02/10/22   Dettinger, Elige Radon, MD  albuterol (VENTOLIN HFA) 108 (90 Base) MCG/ACT inhaler INHALE 2 PUFFS EVERY 4 HOURS AS NEEDED 11/25/21   Dettinger, Elige Radon, MD  amitriptyline (ELAVIL) 100 MG tablet TAKE 2 TABLETS BY MOUTH AT BEDTIME 10/16/22   Dettinger, Elige Radon, MD  azelastine (ASTELIN) 0.1 % nasal spray Use in each nostril as directed 08/23/21   Dettinger, Elige Radon, MD  blood glucose meter kit and supplies Dispense based on patient and insurance preference. Use up to four times daily as directed. (FOR ICD-10 E10.9, E11.9). Pt states needs One Touch Verio Meter and one touch delica plus test strips 11/27/21   Dettinger, Elige Radon, MD  Blood Glucose Monitoring Suppl [facility] BLOOD GLUCOSE SYSTEM) w/Device KIT Test blood sugars four times daily 06/30/17   Dettinger, Elige Radon, MD  Budeson-Glycopyrrol-Formoterol (BREZTRI AEROSPHERE) 160-9-4.8 MCG/ACT AERO USE 2 PUFFS EVERY MORNING AND AT BEDTIME 09/15/22   Dettinger, Elige Radon, MD  cyclobenzaprine (FLEXERIL) 10 MG tablet Take 1 tablet by mouth 3  times daily as needed for muscle spasm. Warning: May cause drowsiness. 09/13/22   Mardella Layman, MD  diclofenac Sodium (VOLTAREN) 1 % GEL Apply 2 g topically 4 (four) times daily. 08/19/22   St Vena Austria, NP  EPINEPHRINE 0.3 mg/0.3 mL IJ SOAJ injection INJECT 0.3ML (0.3MG ) IM ONCE 04/05/21   Dettinger, Elige Radon, MD  famotidine (PEPCID) 20 MG tablet TAKE ONE (1) TABLET BY MOUTH TWO (2) TIMES DAILY 08/22/22   Dettinger, Elige Radon, MD  ferrous sulfate 325 (65 FE) MG tablet Take 325 mg by mouth daily with breakfast.    [provider]  finasteride (PROSCAR) 5 MG tablet Take one tablet daily 08/23/21   Dettinger, Elige Radon, MD  fluticasone (FLONASE) 50 MCG/ACT nasal spray USE 1 TO 2 SPRAYS IN EACH NOSTRIL DAILY 06/05/22   Dettinger, Elige Radon, MD  furosemide (LASIX) 20 MG tablet TAKE 1 TABLET BY MOUTH DAILY AS NEEDED 05/06/22   Dettinger, Elige Radon, MD  glucose blood (CONTOUR NEXT TEST) test strip Test blood sugars four times daily 06/30/17   Dettinger, Elige Radon, MD  ipratropium (ATROVENT) 0.06 % nasal spray 2 sprays into each nostril Three (3) times a day. 12/13/21 12/13/22  [provider]  Lancets [facility] Larose Kells Louanne Skye) MISC  11/27/21   [provider]  lisdexamfetamine (VYVANSE) 30 MG capsule Take by mouth.    [provider]  losartan (COZAAR) 50 MG tablet TAKE ONE (1) TABLET BY  MOUTH EVERY DAY 11/06/21   Dettinger, Elige Radon, MD  montelukast (SINGULAIR) 10 MG tablet TAKE ONE TABLET DAILY AT BEDTIME 06/05/22   Dettinger, Elige Radon, MD  pravastatin (PRAVACHOL) 80 MG tablet TAKE ONE (1) TABLET BY MOUTH EVERY DAY 10/16/22   Dettinger, Elige Radon, MD  Semaglutide, 1 MG/DOSE, 4 MG/3ML SOPN Inject 1 mg as directed once a week. 09/22/22   Dettinger, Elige Radon, MD  Spacer/Aero-Holding Rudean Curt Use with inhaler daily 06/14/20   Icard, Rachel Bo, DO  SYRINGE-NEEDLE, DISP, 3 ML 21G X 1-1/2" 3 ML MISC Use to inject testosterone every week 07/22/21   Roma Kayser,  MD  tadalafil (CIALIS) 10 MG tablet Take 1 tablet (10 mg total) by mouth every other day as needed for erectile dysfunction. 11/27/21   Dettinger, Elige Radon, MD  tamsulosin (FLOMAX) 0.4 MG CAPS capsule TAKE 1 CAPSULE DAILY AFTER SUPPER 09/15/22   Dettinger, Elige Radon, MD  testosterone cypionate (DEPOTESTOSTERONE CYPIONATE) 200 MG/ML injection Inject 100 mg (0.5 ml ) every 10 days . For IM use only. 11/10/22   Roma Kayser, MD      Allergies    Egg-derived products, Septra [sulfamethoxazole-trimethoprim], and Sulfonamide derivatives    Review of Systems   Review of Systems  Constitutional:  Negative for chills, diaphoresis and fever.  HENT:  Negative for sore throat.   Respiratory:  Negative for cough and shortness of breath.   Cardiovascular:  Negative for chest pain, palpitations and leg swelling.  Gastrointestinal:  Negative for abdominal pain, blood in stool, nausea and vomiting.  Genitourinary:  Negative for dysuria and flank pain.  Musculoskeletal:  Negative for back pain and neck pain.  Neurological:  Negative for syncope and headaches.  Psychiatric/Behavioral:  Negative for confusion.     Physical Exam Updated Vital Signs BP (!) 144/105   Pulse (!) 121   Temp 98.6 F (37 C) (Oral)   Resp 14   Ht 1.74 m (5' 8.5")   Wt 80.3 kg   SpO2 99%   BMI 26.52 kg/m  Physical Exam Vitals and nursing note reviewed.  Constitutional:      Appearance: Normal appearance. He is well-developed.  HENT:     Head: Atraumatic.     Nose: Nose normal.  Eyes:     General: No scleral icterus.    Conjunctiva/sclera: Conjunctivae normal.  Neck:     Trachea: No tracheal deviation.     Comments: Thyroid not grossly enlarged or tender.  Cardiovascular:     Rate and Rhythm: Tachycardia present. Rhythm irregular.     Pulses: Normal pulses.     Heart sounds: Normal heart sounds. No murmur heard.    No friction rub. No gallop.  Pulmonary:     Effort: Pulmonary effort is normal. No accessory  muscle usage or respiratory distress.     Breath sounds: Normal breath sounds.  Abdominal:     General: Bowel sounds are normal. There is no distension.     Palpations: Abdomen is soft.     Tenderness: There is no abdominal tenderness.  Genitourinary:    Comments: No cva tenderness. Musculoskeletal:        General: No swelling or tenderness.     Cervical back: Normal range of motion and neck supple. No rigidity.     Right lower leg: No edema.     Left lower leg: No edema.  Skin:    General: Skin is warm and dry.     Findings: No rash.  Neurological:     Mental Status: He is alert.     Comments: Alert, speech clear. Motor/sens grossly intact bil   Psychiatric:        Mood and Affect: Mood normal.     ED Results / Procedures / Treatments   Labs (all labs ordered are listed, but only abnormal results are displayed) Results for orders placed or performed during the hospital encounter of 11/10/22  Basic metabolic panel  Result Value Ref Range   Sodium 137 135 - 145 mmol/L   Potassium 4.6 3.5 - 5.1 mmol/L   Chloride 105 98 - 111 mmol/L   CO2 24 22 - 32 mmol/L   Glucose, Bld 78 70 - 99 mg/dL   BUN 17 8 - 23 mg/dL   Creatinine, Ser 5.95 0.61 - 1.24 mg/dL   Calcium 9.4 8.9 - 63.8 mg/dL   GFR, Estimated >75 >64 mL/min   Anion gap 8 5 - 15  CBC  Result Value Ref Range   WBC 6.7 4.0 - 10.5 K/uL   RBC 3.94 (L) 4.22 - 5.81 MIL/uL   Hemoglobin 12.5 (L) 13.0 - 17.0 g/dL   HCT 33.2 95.1 - 88.4 %   MCV 99.0 80.0 - 100.0 fL   MCH 31.7 26.0 - 34.0 pg   MCHC 32.1 30.0 - 36.0 g/dL   RDW 16.6 06.3 - 01.6 %   Platelets 263 150 - 400 K/uL   nRBC 0.0 0.0 - 0.2 %      EKG EKG Interpretation Date/Time:  Monday November 10 2022 11:31:27 EST Ventricular Rate:  132 PR Interval:  144 QRS Duration:  118 QT Interval:  322 QTC Calculation: 477 R Axis:   -65  Text Interpretation: Atrial fibrillation with rapid ventricular response Non-specific ST-t changes Confirmed by Cathren Laine  ([id number]) on 11/10/2022 12:17:57 PM  Radiology DG Chest Port 1 View  Result Date: 11/10/2022 CLINICAL DATA:  5107 Atrial fibrillation (HCC) 5107.  Palpitations. EXAM: PORTABLE CHEST 1 VIEW COMPARISON:  09/13/2022. FINDINGS: Bilateral lung fields are clear. Bilateral costophrenic angles are clear. Normal cardio-mediastinal silhouette. Loop recorder device noted overlying the left chest wall. No acute osseous abnormalities. The soft tissues are within normal limits. IMPRESSION: *No active disease. Electronically Signed   By: Jules Schick M.D.   On: 11/10/2022 13:50    Procedures .Sedation  Date/Time: 11/10/2022 2:54 PM  Performed by: Cathren Laine, MD Authorized by: Cathren Laine, MD   Consent:    Consent given by:  Patient Universal protocol:    Immediately prior to procedure, a time out was called: yes     Patient identity confirmed:  Arm band, verbally with patient and provided demographic data Indications:    Procedure performed:  Cardioversion   Procedure necessitating sedation performed by:  Physician performing sedation Pre-sedation assessment:    Time since last food or drink:  3   ASA classification: class 2 - patient with mild systemic disease     Mallampati score:  II - soft palate, uvula, fauces visible   Pre-sedation assessments completed and reviewed: airway patency, cardiovascular function, hydration status, mental status, nausea/vomiting, pain level, respiratory function and temperature     Pre-sedation assessment completed:  11/10/2022 2:26 PM Immediate pre-procedure details:    Reviewed: vital signs, relevant labs/tests and NPO status     Verified: bag valve mask available, emergency equipment available, intubation equipment available, IV patency confirmed and oxygen available   Procedure details (see MAR for exact dosages):    Preoxygenation:  Nasal  cannula   Sedation:  Propofol   Intra-procedure monitoring:  Blood pressure monitoring, cardiac monitor, continuous pulse  oximetry, continuous capnometry, frequent LOC assessments and frequent vital sign checks   Intra-procedure events: none     Total Provider sedation time (minutes):  20 Post-procedure details:    Post-sedation assessment completed:  11/10/2022 2:56 PM   Attendance: Constant attendance by certified staff until patient recovered     Recovery: Patient returned to pre-procedure baseline     Post-sedation assessments completed and reviewed: airway patency, cardiovascular function, mental status, nausea/vomiting and respiratory function     Patient is stable for discharge or admission: yes     Procedure completion:  Tolerated well, no immediate complications Comments:     Single cardioversion shock at 120J. With conversion to nsr, hr 78, repeat ecg done. Pt tolerated well. No cp or sob.  .Cardioversion  Date/Time: 11/10/2022 2:58 PM  Performed by: Cathren Laine, MD Authorized by: Cathren Laine, MD   Consent:    Consent given by:  Patient Pre-procedure details:    Cardioversion basis:  Emergent   Rhythm:  Atrial fibrillation   Electrode placement:  Anterior-posterior Patient sedated: Yes. Refer to sedation procedure documentation for details of sedation.  Attempt one:    Cardioversion mode:  Synchronous   Shock (Joules):  120   Shock outcome:  Conversion to normal sinus rhythm Post-procedure details:    Patient status:  Alert   Patient tolerance of procedure:  Tolerated well, no immediate complications     Medications Ordered in ED Medications  diltiazem (CARDIZEM) 1 mg/mL load via infusion 20 mg (20 mg Intravenous Bolus from Bag 11/10/22 1215)  propofol (DIPRIVAN) 10 mg/mL bolus/IV push 40.2 mg (40.2 mg Intravenous Given 11/10/22 1447)  propofol (DIPRIVAN) 10 mg/mL bolus/IV push 30 mg (30 mg Intravenous Given 11/10/22 1450)  propofol (DIPRIVAN) 10 mg/mL bolus/IV push 30 mg (30 mg Intravenous Given 11/10/22 1451)    ED Course/ Medical Decision Making/ A&P                                  Medical Decision Making Problems Addressed: Atrial fibrillation with rapid ventricular response (HCC): acute illness or injury with systemic symptoms that poses a threat to life or bodily functions Elevated blood pressure reading: acute illness or injury Essential hypertension: chronic illness or injury with exacerbation, progression, or side effects of treatment that poses a threat to life or bodily functions  Amount and/or Complexity of Data Reviewed External Data Reviewed: notes. Labs: ordered. Decision-making details documented in ED Course. Radiology: ordered and independent interpretation performed. Decision-making details documented in ED Course. ECG/medicine tests: ordered and independent interpretation performed. Decision-making details documented in ED Course.  Risk Prescription drug management. Decision regarding hospitalization.   Iv ns. Continuous pulse ox and cardiac monitoring. Labs ordered/sent. Imaging ordered.   Differential diagnosis includes afib/rvr, anemia, st, etc. Dispo decision including potential need for admission considered - will get labs and imaging and reassess.   Reviewed nursing notes and prior charts for additional history. External reports reviewed.   Cardiac monitor: afib/flutter, rate 130.   Cardizem bolus/gtt.  Pt remains in afib/flutter rate 130.  Chadsvasc score = 3  Labs reviewed/interpreted by me - wbc and hct normal. Chem normal.   Pt confirmed has been compliant w eliquis therapy in past few months. Pt gives consent for ed cardioversion, indicates has worked well in past.   Xrays reviewed/interpreted  by me - no pna.   Pt in nsr, no c/o, no cp or sob.   Pt appears stable for d/c.   Rec close cardiology f/u.  Return precautions provided.    CRITICAL CARE RE: afib/flutter w rvr, ed cardioversion. Performed by: Suzi Roots Total critical care time: 40 minutes Critical care time was exclusive of separately billable  procedures and treating other patients. Critical care was necessary to treat or prevent imminent or life-threatening deterioration. Critical care was time spent personally by me on the following activities: development of treatment plan with patient and/or surrogate as well as nursing, discussions with consultants, evaluation of patient's response to treatment, examination of patient, obtaining history from patient or surrogate, ordering and performing treatments and interventions, ordering and review of laboratory studies, ordering and review of radiographic studies, pulse oximetry and re-evaluation of patient's condition.         Final Clinical Impression(s) / ED Diagnoses Final diagnoses:  None    Rx / DC Orders ED Discharge Orders     None         Cathren Laine, MD 11/10/22 1503

## 2022-11-10 NOTE — Progress Notes (Signed)
11/10/2022     Endocrinology follow-up note   Johnny Mathews, 62 y.o., male   Chief Complaint  Patient presents with   Follow-up    Hypogonadism, male     Past Medical History:  Diagnosis Date   Allergy    Alpha galactosidase deficiency    Anemia    past hx    Arthritis    Asthma    Blood transfusion without reported diagnosis    BPH (benign prostatic hypertrophy)    Cataract    removed left eye    Complication of anesthesia    Diabetes mellitus without complication (HCC)    Dysrhythmia    GERD (gastroesophageal reflux disease)    Heart murmur    History of kidney stones    HOH (hard of hearing)    Hyperlipidemia    Hypertension    Irregular heart beat    Muscular dystrophy (HCC)    PONV (postoperative nausea and vomiting)    Sleep apnea    Wears hearing aid in both ears    Past Surgical History:  Procedure Laterality Date   CATARACT EXTRACTION Left    COLONOSCOPY     ESOPHAGOGASTRODUODENOSCOPY N/A 08/27/2020   Procedure: ESOPHAGOGASTRODUODENOSCOPY (EGD);  Surgeon: Corliss Skains, MD;  Location: [facility] OR;  Service: Thoracic;  Laterality: N/A;   FEMUR FRACTURE SURGERY     INGUINAL HERNIA REPAIR Right 08/07/2021   Procedure: HERNIA REPAIR INGUINAL ADULT WITH MESH;  Surgeon: Lucretia Roers, MD;  Location: [facility] ORS;  Service: General;  Laterality: Right;   INSERTION OF MESH N/A 08/27/2020   Procedure: INSERTION OF ACELL 7.5 x 6cm GENTRIX SURGICAL MATRIX HIATAL MESH;  Surgeon: Corliss Skains, MD;  Location: [facility] OR;  Service: Thoracic;  Laterality: N/A;   KIDNEY STONE SURGERY     x6   KNEE SURGERY Right    x2   MASS EXCISION Right 08/02/2018   Procedure: EXCISION RIGHT LONG FINGER MASS, DEBRIDEMENT PROXIMAL INTERPHALANGEAL JOINT WITH ROTATION FLAP;  Surgeon: Betha Loa, MD;  Location: [facility];  Service: Orthopedics;  Laterality: Right;   NISSEN FUNDOPLICATION     [facility]    SHOULDER OPEN  ROTATOR CUFF REPAIR     XI ROBOTIC ASSISTED HIATAL HERNIA REPAIR N/A 08/27/2020   Procedure: XI ROBOTIC ASSISTED REDO HIATAL HERNIA REPAIR;  Surgeon: Corliss Skains, MD;  Location: [facility] OR;  Service: Thoracic;  Laterality: N/A;   Social History   Socioeconomic History   Marital status: Married    Spouse name: Not on file   Number of children: Not on file   Years of education: Not on file   Highest education level: Not on file  Occupational History   Occupation: Designer, industrial/product  Tobacco Use   Smoking status: Former    Current packs/day: 0.00    Average packs/day: 1 pack/day for 10.0 years (10.0 ttl pk-yrs)    Types: Cigarettes    Start date: 01/07/1984    Quit date: 01/06/1994    Years since quitting: 28.8   Smokeless tobacco: Current    Types: Snuff  Vaping Use   Vaping status: Never Used  Substance and Sexual Activity   Alcohol use: Yes    Alcohol/week: 2.0 - 3.0 standard drinks of alcohol    Types: 2 - 3 Standard drinks or equivalent per week    Comment: social   Drug use: No   Sexual activity: Not on file  Other Topics Concern   Not on file  Social  History Narrative   Not on file   Social Determinants of Health   Financial Resource Strain: Not on file  Food Insecurity: Not on file  Transportation Needs: Not on file  Physical Activity: Not on file  Stress: Not on file  Social Connections: Not on file   Outpatient Encounter Medications as of 11/10/2022  Medication Sig   albuterol (PROVENTIL) (2.5 MG/3ML) 0.083% nebulizer solution NEBULIZE 1 VIAL EVERY 6 HOURS AS NEEDED FOR WHEEZING & SHORTNESS OF BREATH   albuterol (VENTOLIN HFA) 108 (90 Base) MCG/ACT inhaler INHALE 2 PUFFS EVERY 4 HOURS AS NEEDED   amitriptyline (ELAVIL) 100 MG tablet TAKE 2 TABLETS BY MOUTH AT BEDTIME   azelastine (ASTELIN) 0.1 % nasal spray Use in each nostril as directed   blood glucose meter kit and supplies Dispense based on patient and insurance preference. Use up to four times daily as  directed. (FOR ICD-10 E10.9, E11.9). Pt states needs One Touch Verio Meter and one touch delica plus test strips   Blood Glucose Monitoring Suppl (CONTOUR BLOOD GLUCOSE SYSTEM) w/Device KIT Test blood sugars four times daily   Budeson-Glycopyrrol-Formoterol (BREZTRI AEROSPHERE) 160-9-4.8 MCG/ACT AERO USE 2 PUFFS EVERY MORNING AND AT BEDTIME   cyclobenzaprine (FLEXERIL) 10 MG tablet Take 1 tablet by mouth 3 times daily as needed for muscle spasm. Warning: May cause drowsiness.   diclofenac Sodium (VOLTAREN) 1 % GEL Apply 2 g topically 4 (four) times daily.   EPINEPHRINE 0.3 mg/0.3 mL IJ SOAJ injection INJECT 0.3ML (0.3MG ) IM ONCE   famotidine (PEPCID) 20 MG tablet TAKE ONE (1) TABLET BY MOUTH TWO (2) TIMES DAILY   ferrous sulfate 325 (65 FE) MG tablet Take 325 mg by mouth daily with breakfast.   finasteride (PROSCAR) 5 MG tablet Take one tablet daily   fluticasone (FLONASE) 50 MCG/ACT nasal spray USE 1 TO 2 SPRAYS IN EACH NOSTRIL DAILY   furosemide (LASIX) 20 MG tablet TAKE 1 TABLET BY MOUTH DAILY AS NEEDED   glucose blood (CONTOUR NEXT TEST) test strip Test blood sugars four times daily   ipratropium (ATROVENT) 0.06 % nasal spray 2 sprays into each nostril Three (3) times a day.   Lancets (ONETOUCH DELICA PLUS LANCET33G) MISC    lisdexamfetamine (VYVANSE) 30 MG capsule Take by mouth.   losartan (COZAAR) 50 MG tablet TAKE ONE (1) TABLET BY MOUTH EVERY DAY   montelukast (SINGULAIR) 10 MG tablet TAKE ONE TABLET DAILY AT BEDTIME   pravastatin (PRAVACHOL) 80 MG tablet TAKE ONE (1) TABLET BY MOUTH EVERY DAY   Semaglutide, 1 MG/DOSE, 4 MG/3ML SOPN Inject 1 mg as directed once a week.   Spacer/Aero-Holding Chambers DEVI Use with inhaler daily   SYRINGE-NEEDLE, DISP, 3 ML 21G X 1-1/2" 3 ML MISC Use to inject testosterone every week   tadalafil (CIALIS) 10 MG tablet Take 1 tablet (10 mg total) by mouth every other day as needed for erectile dysfunction.   tamsulosin (FLOMAX) 0.4 MG CAPS capsule TAKE 1  CAPSULE DAILY AFTER SUPPER   testosterone cypionate (DEPOTESTOSTERONE CYPIONATE) 200 MG/ML injection Inject 100 mg (0.5 ml ) every 10 days . For IM use only.   [DISCONTINUED] diclofenac (VOLTAREN) 75 MG EC tablet Take 1 tablet (75 mg total) by mouth 2 (two) times daily.   [DISCONTINUED] triamcinolone (KENALOG) 0.025 % ointment Apply 1 Application topically 2 (two) times daily.   No facility-administered encounter medications on file as of 11/10/2022.   ALLERGIES: Allergies  Allergen Reactions   Egg-Derived Products Swelling    Throat-  03-31-2019 pt states can eat cakes and Pie with no issues  Throat- 03-31-2019 pt states can eat cakes and Pie with no issues    Septra [Sulfamethoxazole-Trimethoprim] Swelling    throat   Sulfonamide Derivatives Hives    VACCINATION STATUS: Immunization History  Administered Date(s) Administered   Influenza Inj Mdck Quad Pf 09/24/2018, 10/22/2020   Influenza, Quadrivalent, Recombinant, Inj, Pf 01/15/2017   Influenza-Unspecified 09/24/2018, 10/31/2019   Moderna SARS-COV2 Booster Vaccination 04/26/2020   Moderna Sars-Covid-2 Vaccination 01/15/2019, 02/12/2019, 09/07/2019   Pneumococcal Polysaccharide-23 11/09/2019   Tdap 07/20/2009, 11/09/2019   Zoster Recombinant(Shingrix) 08/22/2020, 02/22/2021    HPI: VERLIN UHER is a 62 y.o.-year-old man.  He is returning for a follow-up after he was seen in consultation for hypogonadism.  He does not have new complaints today.  His previsit labs show improved total testosterone of 603, currently on testosterone 100 mg every 10 days IM. He notes from previous visits. He reports that he has hypogonadism for as long as he remembers, does not father any biological children although he adopted 3 kids.  He reports to have been told that he cannot father any children due to low sperm count.  He reports consistency with injection of his testosterone every other week.    He was found to have tachycardia up to 120 on  triage today.  He gives history of tachyarrhythmia which required ablative therapy in the past.  He denies  trauma to testes,  chemotherapy,  testicular irradiation,  nor genitourinary surgery. Denies new complaints since last visit.  He wishes to be continued on testosterone treatment.   -His recent labs show normal CBC, PSA   -  He has asthma on various inhalers. No chronic pain. Not on opiates, does not take steroids.    He does not have family  Or personal history of premature  cardiac disease.  ROS: Limited as above.  PE: BP 122/84   Pulse (!) 120   Ht 5\' 8"  (1.727 m)   Wt 173 lb (78.5 kg)   BMI 26.30 kg/m  Wt Readings from Last 3 Encounters:  11/10/22 173 lb (78.5 kg)  08/25/22 178 lb (80.7 kg)  08/12/22 179 lb 6.4 oz (81.4 kg)    Genital exam: normal male escutcheon, no inguinal LAD, normal phallus, significantly shrunk testes bilaterally to 5 mL, no testicular or scrotal mass lesions.  no penile discharge.  No gynecomastia.   CMP ( most recent) CMP     Component Value Date/Time   NA 140 07/25/2022 1120   K 4.5 07/25/2022 1120   CL 104 07/25/2022 1120   CO2 22 07/25/2022 1120   GLUCOSE 82 07/25/2022 1120   GLUCOSE 88 08/28/2020 0618   BUN 19 07/25/2022 1120   CREATININE 1.09 07/25/2022 1120   CALCIUM 9.5 07/25/2022 1120   PROT 6.9 07/25/2022 1120   ALBUMIN 4.5 07/25/2022 1120   AST 29 07/25/2022 1120   ALT 26 07/25/2022 1120   ALKPHOS 153 (H) 07/25/2022 1120   BILITOT 0.4 07/25/2022 1120   GFRNONAA >60 08/28/2020 0618   GFRAA 74 02/23/2020 0929     Diabetic Labs (most recent): Lab Results  Component Value Date   HGBA1C 5.2 03/13/2022   HGBA1C 5.2 08/23/2021   HGBA1C 4.9 05/22/2021     Lipid Panel ( most recent) Lipid Panel     Component Value Date/Time   CHOL 165 06/28/2021 0807   TRIG 110 06/28/2021 0807   HDL 59 06/28/2021 0807   CHOLHDL 2.8  06/28/2021 0807   CHOLHDL 4.8 03/30/2009 1807   VLDL 20 03/30/2009 1807   LDLCALC 86 06/28/2021  0807    Recent Results (from the past 2160 hour(s))  Testosterone, Free, Total, SHBG     Status: Abnormal   Collection Time: 11/03/22  2:10 PM  Result Value Ref Range   Testosterone 603 264 - 916 ng/dL    Comment: Adult male reference interval is based on a population of healthy nonobese males (BMI <30) between 69 and 46 years old. Travison, et.al. JCEM [phone]. PMID: [id number].    Testosterone, Free 4.9 (L) 6.6 - 18.1 pg/mL   Sex Hormone Binding 77.1 (H) 19.3 - 76.4 nmol/L  CBC with Differential/Platelet     Status: Abnormal   Collection Time: 11/03/22  2:10 PM  Result Value Ref Range   WBC 7.2 3.4 - 10.8 x10E3/uL   RBC 3.83 (L) 4.14 - 5.80 x10E6/uL   Hemoglobin 12.2 (L) 13.0 - 17.7 g/dL   Hematocrit 66.4 40.3 - 51.0 %   MCV 99 (H) 79 - 97 fL   MCH 31.9 26.6 - 33.0 pg   MCHC 32.2 31.5 - 35.7 g/dL   RDW 47.4 25.9 - 56.3 %   Platelets 281 150 - 450 x10E3/uL   Neutrophils 69 Not Estab. %   Lymphs 16 Not Estab. %   Monocytes 9 Not Estab. %   Eos 6 Not Estab. %   Basos 0 Not Estab. %   Neutrophils Absolute 5.0 1.4 - 7.0 x10E3/uL   Lymphocytes Absolute 1.1 0.7 - 3.1 x10E3/uL   Monocytes Absolute 0.6 0.1 - 0.9 x10E3/uL   EOS (ABSOLUTE) 0.4 0.0 - 0.4 x10E3/uL   Basophils Absolute 0.0 0.0 - 0.2 x10E3/uL   Immature Granulocytes 0 Not Estab. %   Immature Grans (Abs) 0.0 0.0 - 0.1 x10E3/uL     ASSESSMENT:  1. Hypogonadism   PLAN:   Due to his  tachyarrhythmia and given his prior history of tachyarrhythmia which required ablative therapy, he was sent to ER  with a note for better assessment and treatment.  His appointment with his cardiologist is reportedly on November 25, 2022. See notes from previous visits.  He returns with acceptable total testosterone level at 603, progressively improving from 93.  He is advised to continue testosterone  100 mg IM every 10 days with plan to repeat total testosterone before his next visit in 4 months.  This will give him a total of  300 mg of testosterone monthly.   -  I discussed adverse effects of unnecessary testosterone replacement short-term and long-term.  In this patient with bilaterally shrunk testicles, his hypogonadism is likely chronic and primary. I discussed with him the fact that testosterone replacement will further diminish his chance of fertility.  At this point, he is not interested to keep his fertility.  -Given his medical history of sleep apnea and BPH which are relative contraindications for testosterone replacement, his testosterone dose will be titrated slowly based on his clinical response.    -He is already on PDE 5 inhibitors for ED.   Patient does have history of diabetes,  which seems to have reversed.  His A1c was 4.9% in May 2023.  He remains on Trulicity 0.75 mg subcutaneously weekly.     He is encouraged to keep close follow-up with his PMD.   I spent  26  minutes in the care of the patient today including review of labs from Thyroid Function, CMP, and other relevant labs ; imaging/biopsy  records (current and previous including abstractions from other facilities); face-to-face time discussing  his lab results and symptoms, medications doses, his options of short and long term treatment based on the latest standards of care / guidelines;   and documenting the encounter.  Johnny Mathews  participated in the discussions, expressed understanding, and voiced agreement with the above plans.  All questions were answered to his satisfaction. he is encouraged to contact clinic should he have any questions or concerns prior to his return visit.    No follow-ups on file.  Marquis Lunch, MD [facility] [address] [place], [place] [address] Phone: [phone]  Fax: [phone]   11/10/2022, 10:40 AM  This note was partially dictated with voice recognition software. Similar sounding words can be transcribed inadequately or may not  be  corrected upon review.

## 2022-11-10 NOTE — Discharge Instructions (Addendum)
It was our pleasure to provide your ER care today - we hope that you feel better.  Continue your meds.   Follow up closely with your cardiologist in the next 1-2 weeks for both your atrial fibrillation and your blood pressure.   Return to ER if worse, new symptoms, chest pain, trouble breathing, persistent fast heart beating, or other concern.   You were given sedating medication in the ER - no driving for the next 8 hours.

## 2022-11-10 NOTE — ED Triage Notes (Signed)
Pt to er, pt states that he is here because he has a fast heart rate, states that he was supposed to have an ablation in the past, but his md to covid and he wasn't able to.  Pt states that he was at his md today and they sent him over.

## 2022-11-11 ENCOUNTER — Other Ambulatory Visit: Payer: Self-pay | Admitting: Family Medicine

## 2022-11-11 ENCOUNTER — Telehealth: Payer: Self-pay

## 2022-11-11 NOTE — Transitions of Care (Post Inpatient/ED Visit) (Signed)
11/11/2022  Name: Johnny Mathews MRN: [id number] DOB: [birth year]  Today's TOC FU Call Status: Today's TOC FU Call Status:: Successful TOC FU Call Completed TOC FU Call Complete Date: 11/11/22 Patient's Name and Date of Birth confirmed.  Transition Care Management Follow-up Telephone Call Date of Discharge: 11/10/22 [facility] Type of Discharge: Emergency Department Reason for ED Visit: Cardiac Conditions Cardiac Conditions Diagnosis: Atrial Fibrillation How have you been since you were released from the hospital?: Better Any questions or concerns?: No  Items Reviewed: Did you receive and understand the discharge instructions provided?: Yes Medications obtained,verified, and reconciled?: Yes (Medications Reviewed) Any new allergies since your discharge?: No Dietary orders reviewed?: Yes Do you have support at home?: No  Medications Reviewed Today: Medications Reviewed Today     Reviewed by Karena Addison, LPN (Licensed Practical Nurse) on 11/11/22 at 1643  Med List Status: <None>   Medication Order Taking? Sig Documenting Provider Last Dose Status Informant  albuterol (PROVENTIL) (2.5 MG/3ML) 0.083% nebulizer solution [id number] No NEBULIZE 1 VIAL EVERY 6 HOURS AS NEEDED FOR WHEEZING & SHORTNESS OF BREATH Dettinger, Elige Radon, MD Taking Active   albuterol (VENTOLIN HFA) 108 (90 Base) MCG/ACT inhaler [id number] No INHALE 2 PUFFS EVERY 4 HOURS AS NEEDED Dettinger, Elige Radon, MD Taking Active   amitriptyline (ELAVIL) 100 MG tablet [id number]  TAKE 2 TABLETS BY MOUTH AT BEDTIME Dettinger, Elige Radon, MD  Active   azelastine (ASTELIN) 0.1 % nasal spray [id number] No Use in each nostril as directed Dettinger, Elige Radon, MD Taking Active   blood glucose meter kit and supplies [id number] No Dispense based on patient and insurance preference. Use up to four times daily as directed. (FOR ICD-10 E10.9, E11.9). Pt states needs One Touch Verio Meter and one touch delica plus  test strips Dettinger, Elige Radon, MD Taking Active   Blood Glucose Monitoring Suppl [facility] BLOOD GLUCOSE SYSTEM) w/Device KIT [id number] No Test blood sugars four times daily Dettinger, Elige Radon, MD Taking Active Self  Budeson-Glycopyrrol-Formoterol (BREZTRI AEROSPHERE) 160-9-4.8 MCG/ACT Sandrea Matte [id number]  USE 2 PUFFS EVERY MORNING AND AT BEDTIME Dettinger, Elige Radon, MD  Active   cyclobenzaprine (FLEXERIL) 10 MG tablet [id number]  Take 1 tablet by mouth 3 times daily as needed for muscle spasm. Warning: May cause drowsiness. Mardella Layman, MD  Active   diclofenac Sodium (VOLTAREN) 1 % GEL [id number] No Apply 2 g topically 4 (four) times daily. [place] Lighter, Dois Davenport, NP Taking Active   EPINEPHRINE 0.3 mg/0.3 mL IJ SOAJ injection [id number] No INJECT 0.3ML (0.3MG ) IM ONCE Dettinger, Elige Radon, MD Taking Active Self  famotidine (PEPCID) 20 MG tablet [id number] No TAKE ONE (1) TABLET BY MOUTH TWO (2) TIMES DAILY Dettinger, Elige Radon, MD Taking Active   ferrous sulfate 325 (65 FE) MG tablet [id number] No Take 325 mg by mouth daily with breakfast. [provider] Taking Active Self  finasteride (PROSCAR) 5 MG tablet [id number] No Take one tablet daily Dettinger, Elige Radon, MD Taking Active   fluticasone (FLONASE) 50 MCG/ACT nasal spray [id number] No USE 1 TO 2 SPRAYS IN EACH NOSTRIL DAILY Dettinger, Elige Radon, MD Taking Active   furosemide (LASIX) 20 MG tablet [id number]  Take 1 tablet (20 mg total) by mouth daily as needed. **NEEDS TO BE SEEN BEFORE NEXT REFILL** Dettinger, Elige Radon, MD  Active   glucose blood (CONTOUR NEXT TEST) test strip [id number] No Test blood sugars four times daily Dettinger, Elige Radon, MD Taking Active Self  ipratropium (ATROVENT) 0.06 %  nasal spray [id number] No 2 sprays into each nostril Three (3) times a day. [provider] Taking Active   Lancets [facility] Larose [place] PLUS [place]) MISC [id number] No  [provider] Taking Active   lisdexamfetamine (VYVANSE) 30 MG  capsule [id number] No Take by mouth. [provider] Taking Active   losartan (COZAAR) 50 MG tablet [id number] No TAKE ONE (1) TABLET BY MOUTH EVERY DAY Dettinger, Elige Radon, MD Taking Active   montelukast (SINGULAIR) 10 MG tablet [id number] No TAKE ONE TABLET DAILY AT BEDTIME Dettinger, Elige Radon, MD Taking Active   pravastatin (PRAVACHOL) 80 MG tablet [id number]  TAKE ONE (1) TABLET BY MOUTH EVERY DAY Dettinger, Elige Radon, MD  Active   Semaglutide, 1 MG/DOSE, 4 MG/3ML SOPN [id number]  Inject 1 mg as directed once a week. Dettinger, Elige Radon, MD  Active   Spacer/Aero-Holding Chambers DEVI [id number] No Use with inhaler daily Icard, Bradley L, DO Taking Active Self  SYRINGE-NEEDLE, DISP, 3 ML 21G X 1-1/2" 3 ML MISC [id number] No Use to inject testosterone every week Roma Kayser, MD Taking Active Self  tadalafil (CIALIS) 10 MG tablet [id number] No Take 1 tablet (10 mg total) by mouth every other day as needed for erectile dysfunction. Dettinger, Elige Radon, MD Taking Active   tamsulosin (FLOMAX) 0.4 MG CAPS capsule [id number]  TAKE 1 CAPSULE DAILY AFTER SUPPER Dettinger, Elige Radon, MD  Active   testosterone cypionate (DEPOTESTOSTERONE CYPIONATE) 200 MG/ML injection [id number]  Inject 100 mg (0.5 ml ) every 10 days . For IM use only. Roma Kayser, MD  Active             Home Care and Equipment/Supplies: Were Home Health Services Ordered?: NA Any new equipment or medical supplies ordered?: NA  Functional Questionnaire: Do you need assistance with bathing/showering or dressing?: No Do you need assistance with meal preparation?: No Do you need assistance with eating?: No Do you have difficulty maintaining continence: No Do you need assistance with getting out of bed/getting out of a chair/moving?: No Do you have difficulty managing or taking your medications?: No  Follow up appointments reviewed: PCP Follow-up appointment confirmed?: No Specialist Hospital Follow-up appointment  confirmed?: Yes Date of Specialist follow-up appointment?: 11/25/22 Follow-Up Specialty Provider:: cardio Do you need transportation to your follow-up appointment?: No Do you understand care options if your condition(s) worsen?: Yes-patient verbalized understanding    SIGNATURE Karena Addison, LPN [facility] Nurse Health Advisor Direct Dial [phone]

## 2022-11-16 ENCOUNTER — Other Ambulatory Visit: Payer: Self-pay | Admitting: Family Medicine

## 2022-11-16 DIAGNOSIS — J4520 Mild intermittent asthma, uncomplicated: Secondary | ICD-10-CM

## 2022-11-16 DIAGNOSIS — G4709 Other insomnia: Secondary | ICD-10-CM

## 2022-11-19 ENCOUNTER — Other Ambulatory Visit: Payer: Self-pay | Admitting: Family Medicine

## 2022-11-21 ENCOUNTER — Encounter: Payer: Self-pay | Admitting: Family Medicine

## 2022-11-21 DIAGNOSIS — G7100 Muscular dystrophy, unspecified: Secondary | ICD-10-CM

## 2022-11-25 DIAGNOSIS — E1122 Type 2 diabetes mellitus with diabetic chronic kidney disease: Secondary | ICD-10-CM | POA: Diagnosis not present

## 2022-11-25 DIAGNOSIS — N189 Chronic kidney disease, unspecified: Secondary | ICD-10-CM | POA: Diagnosis not present

## 2022-11-25 DIAGNOSIS — E785 Hyperlipidemia, unspecified: Secondary | ICD-10-CM | POA: Diagnosis not present

## 2022-11-25 DIAGNOSIS — I5032 Chronic diastolic (congestive) heart failure: Secondary | ICD-10-CM | POA: Diagnosis not present

## 2022-11-25 DIAGNOSIS — Z79899 Other long term (current) drug therapy: Secondary | ICD-10-CM | POA: Diagnosis not present

## 2022-11-25 DIAGNOSIS — Z882 Allergy status to sulfonamides status: Secondary | ICD-10-CM | POA: Diagnosis not present

## 2022-11-25 DIAGNOSIS — Z1152 Encounter for screening for COVID-19: Secondary | ICD-10-CM | POA: Diagnosis not present

## 2022-11-25 DIAGNOSIS — I130 Hypertensive heart and chronic kidney disease with heart failure and stage 1 through stage 4 chronic kidney disease, or unspecified chronic kidney disease: Secondary | ICD-10-CM | POA: Diagnosis not present

## 2022-11-25 DIAGNOSIS — R9431 Abnormal electrocardiogram [ECG] [EKG]: Secondary | ICD-10-CM | POA: Diagnosis not present

## 2022-11-25 DIAGNOSIS — I480 Paroxysmal atrial fibrillation: Secondary | ICD-10-CM | POA: Diagnosis not present

## 2022-11-26 DIAGNOSIS — Z91018 Allergy to other foods: Secondary | ICD-10-CM | POA: Diagnosis not present

## 2022-11-27 DIAGNOSIS — Z95818 Presence of other cardiac implants and grafts: Secondary | ICD-10-CM | POA: Diagnosis not present

## 2022-12-03 ENCOUNTER — Telehealth: Payer: Self-pay | Admitting: Family Medicine

## 2022-12-04 ENCOUNTER — Other Ambulatory Visit: Payer: Self-pay | Admitting: Family Medicine

## 2022-12-04 DIAGNOSIS — E785 Hyperlipidemia, unspecified: Secondary | ICD-10-CM | POA: Diagnosis not present

## 2022-12-04 DIAGNOSIS — I2510 Atherosclerotic heart disease of native coronary artery without angina pectoris: Secondary | ICD-10-CM | POA: Diagnosis not present

## 2022-12-04 DIAGNOSIS — M79641 Pain in right hand: Secondary | ICD-10-CM | POA: Diagnosis not present

## 2022-12-04 DIAGNOSIS — Z79899 Other long term (current) drug therapy: Secondary | ICD-10-CM | POA: Diagnosis not present

## 2022-12-04 DIAGNOSIS — Z7901 Long term (current) use of anticoagulants: Secondary | ICD-10-CM | POA: Diagnosis not present

## 2022-12-04 DIAGNOSIS — M79605 Pain in left leg: Secondary | ICD-10-CM | POA: Diagnosis not present

## 2022-12-04 DIAGNOSIS — Z91040 Latex allergy status: Secondary | ICD-10-CM | POA: Diagnosis not present

## 2022-12-04 DIAGNOSIS — Z7985 Long-term (current) use of injectable non-insulin antidiabetic drugs: Secondary | ICD-10-CM | POA: Diagnosis not present

## 2022-12-04 DIAGNOSIS — S60221A Contusion of right hand, initial encounter: Secondary | ICD-10-CM | POA: Diagnosis not present

## 2022-12-04 DIAGNOSIS — I110 Hypertensive heart disease with heart failure: Secondary | ICD-10-CM | POA: Diagnosis not present

## 2022-12-04 DIAGNOSIS — E119 Type 2 diabetes mellitus without complications: Secondary | ICD-10-CM | POA: Diagnosis not present

## 2022-12-04 DIAGNOSIS — K219 Gastro-esophageal reflux disease without esophagitis: Secondary | ICD-10-CM | POA: Diagnosis not present

## 2022-12-04 DIAGNOSIS — S8012XA Contusion of left lower leg, initial encounter: Secondary | ICD-10-CM | POA: Diagnosis not present

## 2022-12-04 DIAGNOSIS — J45909 Unspecified asthma, uncomplicated: Secondary | ICD-10-CM | POA: Diagnosis not present

## 2022-12-04 DIAGNOSIS — I509 Heart failure, unspecified: Secondary | ICD-10-CM | POA: Diagnosis not present

## 2022-12-04 DIAGNOSIS — Z882 Allergy status to sulfonamides status: Secondary | ICD-10-CM | POA: Diagnosis not present

## 2022-12-04 DIAGNOSIS — Z87891 Personal history of nicotine dependence: Secondary | ICD-10-CM | POA: Diagnosis not present

## 2022-12-04 DIAGNOSIS — W1830XA Fall on same level, unspecified, initial encounter: Secondary | ICD-10-CM | POA: Diagnosis not present

## 2022-12-05 ENCOUNTER — Telehealth: Payer: Self-pay

## 2022-12-05 NOTE — Transitions of Care (Post Inpatient/ED Visit) (Unsigned)
12/05/2022  Name: Johnny Mathews MRN: [id number] DOB: [birth year]  Today's TOC FU Call Status: Today's TOC FU Call Status:: Successful TOC FU Call Completed TOC FU Call Complete Date: 12/05/22 Patient's Name and Date of Birth confirmed.  Transition Care Management Follow-up Telephone Call Date of Discharge: 12/04/22 Discharge Facility: Other [facility]) Name of Other (Non[facility]) Discharge Facility: [facility] Type of Discharge: Emergency Department Reason for ED Visit: Other:, Orthopedic Conditions (Fall) How have you been since you were released from the hospital?: Same Any questions or concerns?: Yes Patient Questions/Concerns:: Continues to have left leg pain and hand pain Patient Questions/Concerns Addressed: Notified Provider of Patient Questions/Concerns  Items Reviewed: Did you receive and understand the discharge instructions provided?: Yes Medications obtained,verified, and reconciled?: Yes (Medications Reviewed) Any new allergies since your discharge?: No Dietary orders reviewed?: NA Do you have support at home?: Yes People in Home: spouse  Medications Reviewed Today: Medications Reviewed Today     Reviewed by Anthoney Harada, LPN (Licensed Practical Nurse) on 12/05/22 at 0913  Med List Status: <None>   Medication Order Taking? Sig Documenting Provider Last Dose Status Informant  albuterol (PROVENTIL) (2.5 MG/3ML) 0.083% nebulizer solution [id number] Yes NEBULIZE 1 VIAL EVERY 6 HOURS AS NEEDED FOR WHEEZING & SHORTNESS OF BREATH Dettinger, Elige Radon, MD Taking Active   albuterol (VENTOLIN HFA) 108 (90 Base) MCG/ACT inhaler [id number] Yes INHALE 2 PUFFS EVERY 4 HOURS AS NEEDED Dettinger, Elige Radon, MD Taking Active   amitriptyline (ELAVIL) 100 MG tablet [id number] Yes TAKE 2 TABLETS BY MOUTH AT BEDTIME Dettinger, Elige Radon, MD Taking Active   azelastine (ASTELIN) 0.1 % nasal spray [id number] Yes USE 1 SPRAY IN EACH NOSTRIL TWICE DAILY AS NEEDED Dettinger, Elige Radon,  MD Taking Active   blood glucose meter kit and supplies [id number] Yes Dispense based on patient and insurance preference. Use up to four times daily as directed. (FOR ICD-10 E10.9, E11.9). Pt states needs One Touch Verio Meter and one touch delica plus test strips Dettinger, Elige Radon, MD Taking Active   Blood Glucose Monitoring Suppl [facility] BLOOD GLUCOSE SYSTEM) w/Device KIT [id number] Yes Test blood sugars four times daily Dettinger, Elige Radon, MD Taking Active Self  Budeson-Glycopyrrol-Formoterol (BREZTRI AEROSPHERE) 160-9-4.8 MCG/ACT Sandrea Matte [id number] Yes USE 2 PUFFS EVERY MORNING AND AT BEDTIME Dettinger, Elige Radon, MD Taking Active   cyclobenzaprine (FLEXERIL) 10 MG tablet [id number] Yes Take 1 tablet by mouth 3 times daily as needed for muscle spasm. Warning: May cause drowsiness. Mardella Layman, MD Taking Active   diclofenac Sodium (VOLTAREN) 1 % GEL [id number] Yes Apply 2 g topically 4 (four) times daily. [place] Lighter, Dois Davenport, NP Taking Active   EPINEPHRINE 0.3 mg/0.3 mL IJ SOAJ injection [id number] Yes INJECT 0.3ML (0.3MG ) IM ONCE Dettinger, Elige Radon, MD Taking Active Self  famotidine (PEPCID) 20 MG tablet [id number] Yes TAKE ONE (1) TABLET BY MOUTH TWO (2) TIMES DAILY Dettinger, Elige Radon, MD Taking Active   ferrous sulfate 325 (65 FE) MG tablet [id number] Yes Take 325 mg by mouth daily with breakfast. [provider] Taking Active Self  finasteride (PROSCAR) 5 MG tablet [id number] Yes Take one tablet daily Dettinger, Elige Radon, MD Taking Active   fluticasone (FLONASE) 50 MCG/ACT nasal spray [id number] Yes USE 1 TO 2 SPRAYS IN EACH NOSTRIL DAILY Dettinger, Elige Radon, MD Taking Active   furosemide (LASIX) 20 MG tablet [id number] Yes Take 1 tablet (20 mg total) by mouth daily as needed. **NEEDS TO BE SEEN BEFORE NEXT REFILL** Dettinger, Ivin Booty  A, MD Taking Active   glucose blood (CONTOUR NEXT TEST) test strip [id number] Yes Test blood sugars four times daily Dettinger, Elige Radon, MD Taking Active  Self  ipratropium (ATROVENT) 0.06 % nasal spray [id number] Yes 2 sprays into each nostril Three (3) times a day. [provider] Taking Active   Lancets [facility] [place] PLUS [place]) MISC [id number] Yes  [provider] Taking Active   lisdexamfetamine (VYVANSE) 30 MG capsule [id number] Yes Take by mouth. [provider] Taking Active   losartan (COZAAR) 50 MG tablet [id number] Yes TAKE ONE (1) TABLET BY MOUTH EVERY DAY Dettinger, Elige Radon, MD Taking Active   montelukast (SINGULAIR) 10 MG tablet [id number] Yes TAKE ONE TABLET DAILY AT BEDTIME Dettinger, Elige Radon, MD Taking Active   pravastatin (PRAVACHOL) 80 MG tablet [id number] Yes TAKE ONE (1) TABLET BY MOUTH EVERY DAY Dettinger, Elige Radon, MD Taking Active   Semaglutide, 1 MG/DOSE, 4 MG/3ML SOPN [id number] Yes Inject 1 mg as directed once a week. Dettinger, Elige Radon, MD Taking Active   Spacer/Aero-Holding [facility] [id number] Yes Use with inhaler daily Icard, Bradley L, DO Taking Active Self  SYRINGE-NEEDLE, DISP, 3 ML 21G X 1-1/2" 3 ML MISC [id number] Yes Use to inject testosterone every week Roma Kayser, MD Taking Active Self  tadalafil (CIALIS) 10 MG tablet [id number] Yes Take 1 tablet (10 mg total) by mouth every other day as needed for erectile dysfunction. Dettinger, Elige Radon, MD Taking Active   tamsulosin (FLOMAX) 0.4 MG CAPS capsule [id number] Yes TAKE 1 CAPSULE DAILY AFTER SUPPER Dettinger, Elige Radon, MD Taking Active   testosterone cypionate (DEPOTESTOSTERONE CYPIONATE) 200 MG/ML injection [id number] Yes Inject 100 mg (0.5 ml ) every 10 days . For IM use only. Roma Kayser, MD Taking Active             Home Care and Equipment/Supplies: Were Home Health Services Ordered?: No Any new equipment or medical supplies ordered?: No  Functional Questionnaire: Do you need assistance with bathing/showering or dressing?: No Do you need assistance with meal preparation?: No Do you need assistance with  eating?: No Do you have difficulty maintaining continence: No Do you need assistance with getting out of bed/getting out of a chair/moving?: No Do you have difficulty managing or taking your medications?: No  Follow up appointments reviewed: PCP Follow-up appointment confirmed?: Yes Date of PCP follow-up appointment?: 12/09/22 Follow-up Provider: Minerva Areola Follow-up appointment confirmed?: NA Do you need transportation to your follow-up appointment?: No Do you understand care options if your condition(s) worsen?: Yes-patient verbalized understanding    SIGNATURE .cbs

## 2022-12-08 NOTE — Telephone Encounter (Signed)
Patient has appt tomorrow with dod soonest we have

## 2022-12-09 ENCOUNTER — Inpatient Hospital Stay: Payer: BC Managed Care – PPO | Admitting: Nurse Practitioner

## 2022-12-10 ENCOUNTER — Ambulatory Visit: Payer: BC Managed Care – PPO | Admitting: Family Medicine

## 2022-12-11 ENCOUNTER — Ambulatory Visit: Payer: BC Managed Care – PPO | Admitting: Family Medicine

## 2022-12-11 ENCOUNTER — Other Ambulatory Visit: Payer: Self-pay | Admitting: Family Medicine

## 2022-12-11 ENCOUNTER — Encounter: Payer: Self-pay | Admitting: Family Medicine

## 2022-12-11 VITALS — BP 123/82 | HR 71 | Ht 68.5 in | Wt 180.0 lb

## 2022-12-11 DIAGNOSIS — S161XXA Strain of muscle, fascia and tendon at neck level, initial encounter: Secondary | ICD-10-CM

## 2022-12-11 DIAGNOSIS — Z23 Encounter for immunization: Secondary | ICD-10-CM | POA: Diagnosis not present

## 2022-12-11 DIAGNOSIS — G7100 Muscular dystrophy, unspecified: Secondary | ICD-10-CM

## 2022-12-11 DIAGNOSIS — S76312A Strain of muscle, fascia and tendon of the posterior muscle group at thigh level, left thigh, initial encounter: Secondary | ICD-10-CM

## 2022-12-11 DIAGNOSIS — R296 Repeated falls: Secondary | ICD-10-CM

## 2022-12-11 DIAGNOSIS — S5012XA Contusion of left forearm, initial encounter: Secondary | ICD-10-CM

## 2022-12-11 DIAGNOSIS — N400 Enlarged prostate without lower urinary tract symptoms: Secondary | ICD-10-CM

## 2022-12-11 DIAGNOSIS — J4520 Mild intermittent asthma, uncomplicated: Secondary | ICD-10-CM

## 2022-12-11 MED ORDER — FAMOTIDINE 20 MG PO TABS: 20.0000 mg | ORAL_TABLET | Freq: Two times a day (BID) | ORAL | 3 refills | Status: DC

## 2022-12-11 MED ORDER — CYCLOBENZAPRINE HCL 10 MG PO TABS
10.0000 mg | ORAL_TABLET | Freq: Three times a day (TID) | ORAL | 2 refills | Status: DC | PRN
Start: 2022-12-11 — End: 2023-11-22

## 2022-12-11 NOTE — Progress Notes (Signed)
BP 123/82   Pulse 71   Ht 5' 8.5" (1.74 m)   Wt 180 lb (81.6 kg)   SpO2 98%   BMI 26.97 kg/m    Subjective:   Patient ID: Johnny Mathews, male    DOB: 03-31-1960, 62 y.o.   MRN: [id number]  HPI: Johnny Mathews is a 62 y.o. male presenting on 12/11/2022 for Fall (Ankle, leg, back pain)  Patient presents today after a fall one week ago. Patient reports he was stepping into the shower around 8PM when he fell. He is unsure exactly how the fall occurred, but he thinks his feet slipped out from underneath him. He immediately was in pain after the fall, particularly his right wrist/hand and left ankle. He thought the pain would improve with sleep, but he woke up around 4AM in severe pain and decided to go to the ER for evaluation. In the ER, he underwent x-rays of the right wrist and left ankle, which were negative for fractures. He continues to have pain in his left ankle, left hamstring, neck, and right fourth finger. He is not currently taking anything for the pain as he ran out of his muscle relaxers. The pain is exacerbated by certain movements such as turning his neck or extending his leg. He denies any head trauma or loss of consciousness during the fall. Patient has had 5-6 falls in the past month.  Relevant past medical, surgical, family and social history reviewed and updated as indicated. Interim medical history since our last visit reviewed. Allergies and medications reviewed and updated.  Review of Systems  Constitutional:  Negative for chills and fever.  HENT:  Negative for congestion and sore throat.   Eyes:  Negative for visual disturbance.  Respiratory:  Negative for chest tightness and shortness of breath.   Cardiovascular:  Negative for chest pain.  Gastrointestinal:  Negative for abdominal pain, nausea and vomiting.  Genitourinary:  Negative for difficulty urinating and flank pain.  Musculoskeletal:  Positive for gait problem, myalgias, neck pain and neck stiffness.   Neurological:  Positive for tremors. Negative for dizziness, syncope, light-headedness, numbness and headaches.    Per HPI unless specifically indicated above   Allergies as of 12/11/2022       Reactions   Egg-derived Products Swelling   Throat- 03-31-2019 pt states can eat cakes and Pie with no issues  Throat- 03-31-2019 pt states can eat cakes and Pie with no issues    Septra [sulfamethoxazole-trimethoprim] Swelling   throat   Sulfonamide Derivatives Hives        Medication List        Accurate as of December 11, 2022 11:59 PM. If you have any questions, ask your nurse or doctor.          STOP taking these medications    lisdexamfetamine 30 MG capsule Commonly known as: VYVANSE Stopped by: Elige Radon Zacharias Ridling       TAKE these medications    albuterol (2.5 MG/3ML) 0.083% nebulizer solution Commonly known as: PROVENTIL NEBULIZE 1 VIAL EVERY 6 HOURS AS NEEDED FOR WHEEZING & SHORTNESS OF BREATH   albuterol 108 (90 Base) MCG/ACT inhaler Commonly known as: VENTOLIN HFA INHALE 2 PUFFS EVERY 4 HOURS AS NEEDED   amitriptyline 100 MG tablet Commonly known as: ELAVIL TAKE 2 TABLETS BY MOUTH AT BEDTIME   azelastine 0.1 % nasal spray Commonly known as: ASTELIN USE 1 SPRAY IN EACH NOSTRIL TWICE DAILY AS NEEDED   blood glucose meter kit and supplies  Dispense based on patient and insurance preference. Use up to four times daily as directed. (FOR ICD-10 E10.9, E11.9). Pt states needs One Touch Verio Meter and one touch delica plus test strips   Breztri Aerosphere 160-9-4.8 MCG/ACT Aero Generic drug: Budeson-Glycopyrrol-Formoterol USE 2 PUFFS EVERY MORNING AND AT BEDTIME   Contour Blood Glucose System w/Device Kit Test blood sugars four times daily   cyclobenzaprine 10 MG tablet Commonly known as: FLEXERIL Take 1 tablet (10 mg total) by mouth 3 (three) times daily as needed for muscle spasms. Take 1 tablet by mouth 3 times daily as needed for muscle spasm. Warning:  May cause drowsiness. What changed:  how much to take how to take this when to take this reasons to take this Changed by: Elige Radon Narda Fundora   diclofenac Sodium 1 % Gel Commonly known as: Voltaren Apply 2 g topically 4 (four) times daily.   EPINEPHrine 0.3 mg/0.3 mL Soaj injection Commonly known as: EPI-PEN INJECT 0.3ML (0.3MG ) IM ONCE   famotidine 20 MG tablet Commonly known as: PEPCID Take 1 tablet (20 mg total) by mouth 2 (two) times daily. What changed: See the new instructions. Changed by: Elige Radon Tasnia Spegal   ferrous sulfate 325 (65 FE) MG tablet Take 325 mg by mouth daily with breakfast.   finasteride 5 MG tablet Commonly known as: PROSCAR TAKE ONE (1) TABLET EACH DAY What changed: additional instructions Changed by: Ivin Booty A Avondre Richens   fluticasone 50 MCG/ACT nasal spray Commonly known as: FLONASE USE 1 TO 2 SPRAYS IN EACH NOSTRIL DAILY   furosemide 20 MG tablet Commonly known as: LASIX TAKE 1 TABLET BY MOUTH DAILY AS NEEDED   glucose blood test strip Commonly known as: Contour Next Test Test blood sugars four times daily   ipratropium 0.06 % nasal spray Commonly known as: ATROVENT 2 sprays into each nostril Three (3) times a day.   losartan 50 MG tablet Commonly known as: COZAAR TAKE ONE (1) TABLET BY MOUTH EVERY DAY   montelukast 10 MG tablet Commonly known as: SINGULAIR TAKE ONE TABLET DAILY AT BEDTIME   OneTouch Delica Plus Lancet33G Misc   pravastatin 80 MG tablet Commonly known as: PRAVACHOL TAKE ONE (1) TABLET BY MOUTH EVERY DAY   Semaglutide (1 MG/DOSE) 4 MG/3ML Sopn Inject 1 mg as directed once a week.   Spacer/Aero-Holding [employer] Use with inhaler daily   SYRINGE-NEEDLE (DISP) 3 ML 21G X 1-1/2" 3 ML Misc Use to inject testosterone every week   tadalafil 10 MG tablet Commonly known as: CIALIS Take 1 tablet (10 mg total) by mouth every other day as needed for erectile dysfunction.   tamsulosin 0.4 MG Caps  capsule Commonly known as: FLOMAX TAKE 1 CAPSULE DAILY AFTER SUPPER   testosterone cypionate 200 MG/ML injection Commonly known as: DEPOTESTOSTERONE CYPIONATE Inject 100 mg (0.5 ml ) every 10 days . For IM use only.         Objective:   BP 123/82   Pulse 71   Ht 5' 8.5" (1.74 m)   Wt 180 lb (81.6 kg)   SpO2 98%   BMI 26.97 kg/m   Wt Readings from Last 3 Encounters:  12/11/22 180 lb (81.6 kg)  11/10/22 177 lb (80.3 kg)  11/10/22 173 lb (78.5 kg)    Physical Exam Vitals and nursing note reviewed.  Constitutional:      General: He is not in acute distress.    Appearance: Normal appearance.  HENT:     Head: Normocephalic and atraumatic.  Right Ear: External ear normal.     Left Ear: External ear normal.  Eyes:     Conjunctiva/sclera: Conjunctivae normal.  Neck:     Comments: Trapezius tender to palpation.  Cardiovascular:     Rate and Rhythm: Normal rate.  Pulmonary:     Effort: Pulmonary effort is normal. No respiratory distress.  Musculoskeletal:        General: No deformity. Normal range of motion.     Cervical back: Normal range of motion and neck supple. Tenderness present.     Right lower leg: No edema.     Left lower leg: No edema.     Comments: Right fourth finger PIP tender to palpation, otherwise no pain with flexion and extension.  Left hamstring pain with knee extension. Left ankle tender to palpation near lateral malleolus. No pain with dorsiflexion, plantarflexion, inversion, and eversion.  Skin:    General: Skin is warm and dry.     Findings: No bruising.  Neurological:     Mental Status: He is alert and oriented to person, place, and time.  Psychiatric:        Mood and Affect: Mood normal.        Behavior: Behavior normal.        Thought Content: Thought content normal.        Judgment: Judgment normal.      Assessment & Plan:   Problem List Items Addressed This Visit       Musculoskeletal and Integument   Muscular dystrophy (HCC)    Relevant Medications   cyclobenzaprine (FLEXERIL) 10 MG tablet   Other Relevant Orders   Ambulatory referral to Physical Therapy   Other Visit Diagnoses       Strain of left hamstring muscle, initial encounter    -  Primary   Relevant Medications   cyclobenzaprine (FLEXERIL) 10 MG tablet   Other Relevant Orders   Ambulatory referral to Physical Therapy     Contusion of left forearm, initial encounter       Relevant Medications   cyclobenzaprine (FLEXERIL) 10 MG tablet   Other Relevant Orders   Ambulatory referral to Physical Therapy     Strain of neck muscle, initial encounter       Relevant Medications   cyclobenzaprine (FLEXERIL) 10 MG tablet   Other Relevant Orders   Ambulatory referral to Physical Therapy     Recurrent falls       Relevant Medications   cyclobenzaprine (FLEXERIL) 10 MG tablet   Other Relevant Orders   Ambulatory referral to Physical Therapy     Encounter for immunization       Relevant Orders   Flu vaccine trivalent PF, 6mos and older(Flulaval,Afluria,Fluarix,Fluzone) (Completed)       Patient likely sustained multiple muscle strains during his fall given exam findings and continued pain despite negative x-rays. Will refill flexeril to help with pain control. Will also refer to physical therapy given multiple falls over the past month.  Follow up plan: Return if symptoms worsen or fail to improve.  Counseling provided for all of the vaccine components Orders Placed This Encounter  Procedures   Flu vaccine trivalent PF, 6mos and older(Flulaval,Afluria,Fluarix,Fluzone)   Ambulatory referral to Physical Therapy    Gillermina Phy, Medical Student [facility] 12/19/2022, 9:26 AM  Patient seen and examined with Gillermina Phy, medical student, agree with assessment and plan above.  Probably recommend that he go to physical therapy to prevent future falls, likely muscular strain and  will treat conservatively as such with muscle  relaxer. Arville Care, MD [facility] 12/19/2022, 9:28 AM

## 2022-12-19 ENCOUNTER — Telehealth: Payer: Self-pay | Admitting: Family Medicine

## 2022-12-19 NOTE — Telephone Encounter (Unsigned)
Copied from CRM [id number]. Topic: Clinical - Medical Advice >> Dec 19, 2022  1:22 PM Fonda Kinder J wrote: Reason for CRM: Pt called in requesting to speak with Dr. Esmond Harps. Pt advised that he did not want to disclose what the call is about. Pt is requesting a callback

## 2022-12-19 NOTE — Telephone Encounter (Signed)
Lmtcb.

## 2022-12-22 ENCOUNTER — Encounter: Payer: Self-pay | Admitting: Family Medicine

## 2022-12-23 ENCOUNTER — Ambulatory Visit: Payer: BC Managed Care – PPO | Admitting: Professional Counselor

## 2022-12-23 NOTE — Telephone Encounter (Signed)
A mychart message in regards to this concern was sent to Dr. Louanne Skye on 12/16. He is out of the office today and will return tomorrow to address.

## 2022-12-24 MED ORDER — TRAZODONE HCL 50 MG PO TABS: 25.0000 mg | ORAL_TABLET | Freq: Every evening | ORAL | 3 refills | Status: DC | PRN

## 2022-12-28 DIAGNOSIS — Z95818 Presence of other cardiac implants and grafts: Secondary | ICD-10-CM | POA: Diagnosis not present

## 2023-01-05 ENCOUNTER — Ambulatory Visit: Payer: BC Managed Care – PPO | Admitting: Family Medicine

## 2023-01-05 ENCOUNTER — Encounter: Payer: Self-pay | Admitting: Family Medicine

## 2023-01-20 ENCOUNTER — Other Ambulatory Visit: Payer: Self-pay | Admitting: Family Medicine

## 2023-01-20 DIAGNOSIS — G4709 Other insomnia: Secondary | ICD-10-CM

## 2023-01-28 ENCOUNTER — Other Ambulatory Visit: Payer: Self-pay | Admitting: Family Medicine

## 2023-01-28 DIAGNOSIS — G4709 Other insomnia: Secondary | ICD-10-CM

## 2023-01-28 DIAGNOSIS — E785 Hyperlipidemia, unspecified: Secondary | ICD-10-CM

## 2023-01-28 DIAGNOSIS — Z95818 Presence of other cardiac implants and grafts: Secondary | ICD-10-CM | POA: Diagnosis not present

## 2023-02-03 NOTE — Telephone Encounter (Unsigned)
Copied from CRM [id number]. Topic: General - Other >> Feb 03, 2023  9:44 AM Gildardo Pounds wrote: Reason for CRM: Called patient back to get him over to [facility] Nurse Triage to see if the patient should be seen sooner because fingers were bent back and patient wants to make sure they are not broken. Call went straight to VM. No message left. Callback number for patient is [phone].

## 2023-02-04 ENCOUNTER — Other Ambulatory Visit: Payer: Self-pay | Admitting: Family Medicine

## 2023-02-04 ENCOUNTER — Ambulatory Visit: Payer: BC Managed Care – PPO | Admitting: Family Medicine

## 2023-02-04 ENCOUNTER — Encounter: Payer: Self-pay | Admitting: Family Medicine

## 2023-02-04 ENCOUNTER — Ambulatory Visit (INDEPENDENT_AMBULATORY_CARE_PROVIDER_SITE_OTHER): Payer: BC Managed Care – PPO

## 2023-02-04 VITALS — BP 130/82 | HR 75 | Ht 68.5 in | Wt 179.0 lb

## 2023-02-04 DIAGNOSIS — G4709 Other insomnia: Secondary | ICD-10-CM

## 2023-02-04 DIAGNOSIS — M19042 Primary osteoarthritis, left hand: Secondary | ICD-10-CM | POA: Diagnosis not present

## 2023-02-04 DIAGNOSIS — W19XXXA Unspecified fall, initial encounter: Secondary | ICD-10-CM

## 2023-02-04 DIAGNOSIS — M79642 Pain in left hand: Secondary | ICD-10-CM | POA: Diagnosis not present

## 2023-02-04 MED ORDER — AMITRIPTYLINE HCL 100 MG PO TABS: 200.0000 mg | ORAL_TABLET | Freq: Every day | ORAL | 1 refills | Status: DC

## 2023-02-04 NOTE — Progress Notes (Signed)
BP 130/82   Pulse 75   Ht 5' 8.5" (1.74 m)   Wt 179 lb (81.2 kg)   SpO2 100%   BMI 26.82 kg/m    Subjective:   Patient ID: Johnny Mathews, male    DOB: 05-18-1960, 63 y.o.   MRN: [id number]  HPI: Johnny Mathews is a 63 y.o. male presenting on 02/04/2023 for Fall (? Broken fingers on left hand. Requesting xray)   HPI Fall and sprained hand Patient is coming in today because just over a week ago he fell into his fire pit while he was working on it and when he fell he somehow bent his finger backwards on his hand and it has been swollen and hurting since then.  He also fell and hit the front of his head when he did but he says I sent her to them anymore and he feels fine with that.  He says that knuckles of his hand are swollen and painful and it hurts to squeeze.  Relevant past medical, surgical, family and social history reviewed and updated as indicated. Interim medical history since our last visit reviewed. Allergies and medications reviewed and updated.  Review of Systems  Constitutional:  Negative for chills and fever.  Eyes:  Negative for discharge.  Respiratory:  Negative for shortness of breath and wheezing.   Cardiovascular:  Negative for chest pain and leg swelling.  Musculoskeletal:  Positive for arthralgias and joint swelling. Negative for back pain and gait problem.  Skin:  Negative for rash.  All other systems reviewed and are negative.   Per HPI unless specifically indicated above   Allergies as of 02/04/2023       Reactions   Egg-derived Products Swelling   Throat- 03-31-2019 pt states can eat cakes and Pie with no issues  Throat- 03-31-2019 pt states can eat cakes and Pie with no issues    Septra [sulfamethoxazole-trimethoprim] Swelling   throat   Sulfonamide Derivatives Hives        Medication List        Accurate as of February 04, 2023  4:43 PM. If you have any questions, ask your nurse or doctor.          albuterol (2.5 MG/3ML) 0.083%  nebulizer solution Commonly known as: PROVENTIL NEBULIZE 1 VIAL EVERY 6 HOURS AS NEEDED FOR WHEEZING & SHORTNESS OF BREATH   albuterol 108 (90 Base) MCG/ACT inhaler Commonly known as: VENTOLIN HFA INHALE 2 PUFFS EVERY 4 HOURS AS NEEDED   amitriptyline 100 MG tablet Commonly known as: ELAVIL Take 2 tablets (200 mg total) by mouth at bedtime. **NEEDS TO BE SEEN BEFORE NEXT REFILL**   azelastine 0.1 % nasal spray Commonly known as: ASTELIN USE 1 SPRAY IN EACH NOSTRIL TWICE DAILY AS NEEDED   blood glucose meter kit and supplies Dispense based on patient and insurance preference. Use up to four times daily as directed. (FOR ICD-10 E10.9, E11.9). Pt states needs One Touch Verio Meter and one touch delica plus test strips   Breztri Aerosphere 160-9-4.8 MCG/ACT Aero Generic drug: Budeson-Glycopyrrol-Formoterol USE 2 PUFFS EVERY MORNING AND AT BEDTIME   Contour Blood Glucose System w/Device Kit Test blood sugars four times daily   cyclobenzaprine 10 MG tablet Commonly known as: FLEXERIL Take 1 tablet (10 mg total) by mouth 3 (three) times daily as needed for muscle spasms. Take 1 tablet by mouth 3 times daily as needed for muscle spasm. Warning: May cause drowsiness.   diclofenac Sodium 1 % Gel  Commonly known as: Voltaren Apply 2 g topically 4 (four) times daily.   EPINEPHrine 0.3 mg/0.3 mL Soaj injection Commonly known as: EPI-PEN INJECT 0.3ML (0.3MG ) IM ONCE   famotidine 20 MG tablet Commonly known as: PEPCID Take 1 tablet (20 mg total) by mouth 2 (two) times daily.   ferrous sulfate 325 (65 FE) MG tablet Take 325 mg by mouth daily with breakfast.   finasteride 5 MG tablet Commonly known as: PROSCAR TAKE ONE (1) TABLET EACH DAY   fluticasone 50 MCG/ACT nasal spray Commonly known as: FLONASE USE 1 TO 2 SPRAYS IN EACH NOSTRIL DAILY   furosemide 20 MG tablet Commonly known as: LASIX TAKE 1 TABLET BY MOUTH DAILY AS NEEDED   glucose blood test strip Commonly known as:  Contour Next Test Test blood sugars four times daily   ipratropium 0.06 % nasal spray Commonly known as: ATROVENT 2 sprays into each nostril Three (3) times a day.   losartan 50 MG tablet Commonly known as: COZAAR TAKE ONE (1) TABLET BY MOUTH EVERY DAY   montelukast 10 MG tablet Commonly known as: SINGULAIR TAKE ONE TABLET DAILY AT BEDTIME   OneTouch Delica Plus Lancet33G Misc   pravastatin 80 MG tablet Commonly known as: PRAVACHOL Take 1 tablet (80 mg total) by mouth daily. **NEEDS TO BE SEEN BEFORE NEXT REFILL**   Semaglutide (1 MG/DOSE) 4 MG/3ML Sopn Inject 1 mg as directed once a week.   Spacer/Aero-Holding [employer] Use with inhaler daily   SYRINGE-NEEDLE (DISP) 3 ML 21G X 1-1/2" 3 ML Misc Use to inject testosterone every week   tadalafil 10 MG tablet Commonly known as: CIALIS Take 1 tablet (10 mg total) by mouth every other day as needed for erectile dysfunction.   tamsulosin 0.4 MG Caps capsule Commonly known as: FLOMAX TAKE 1 CAPSULE DAILY AFTER SUPPER   testosterone cypionate 200 MG/ML injection Commonly known as: DEPOTESTOSTERONE CYPIONATE Inject 100 mg (0.5 ml ) every 10 days . For IM use only.   traZODone 50 MG tablet Commonly known as: DESYREL Take 0.5-1 tablets (25-50 mg total) by mouth at bedtime as needed for sleep.         Objective:   BP 130/82   Pulse 75   Ht 5' 8.5" (1.74 m)   Wt 179 lb (81.2 kg)   SpO2 100%   BMI 26.82 kg/m   Wt Readings from Last 3 Encounters:  02/04/23 179 lb (81.2 kg)  12/11/22 180 lb (81.6 kg)  11/10/22 177 lb (80.3 kg)    Physical Exam Vitals and nursing note reviewed.  Constitutional:      General: He is not in acute distress.    Appearance: He is well-developed. He is not diaphoretic.  Eyes:     General: No scleral icterus.       Right eye: No discharge.     Conjunctiva/sclera: Conjunctivae normal.  Neck:     Thyroid: No thyromegaly.  Musculoskeletal:        General: Normal range of motion.      Right hand: Swelling and tenderness (Pain with range of motion) present. No deformity. Normal range of motion.     Cervical back: Neck supple.     Comments: Pain on range of motion and swelling in the second third and fourth MCP joints.  No pain to palpation  Lymphadenopathy:     Cervical: No cervical adenopathy.  Skin:    General: Skin is warm and dry.     Findings: No rash.  Neurological:     Mental Status: He is alert and oriented to person, place, and time.     Coordination: Coordination normal.  Psychiatric:        Behavior: Behavior normal.     Left hand x-ray: No sign of fracture, does have some degeneration in the end of his fingertips.  Await final read from radiology  Assessment & Plan:   Problem List Items Addressed This Visit   None Visit Diagnoses       Fall, initial encounter    -  Primary   Relevant Orders   DG Hand Complete Left     Other insomnia       Relevant Medications   amitriptyline (ELAVIL) 100 MG tablet       Conservative management with ice and anti-inflammatories, likely strained or sprained the joints in the finger. Follow up plan: Return if symptoms worsen or fail to improve, for 1 to 43-month diabetes follow-up.  Counseling provided for all of the vaccine components Orders Placed This Encounter  Procedures   DG Hand Complete Left    Arville Care, MD [facility] 02/04/2023, 4:43 PM

## 2023-02-05 ENCOUNTER — Encounter: Payer: Self-pay | Admitting: Family Medicine

## 2023-02-22 ENCOUNTER — Other Ambulatory Visit: Payer: Self-pay | Admitting: Family Medicine

## 2023-02-23 ENCOUNTER — Other Ambulatory Visit: Payer: Self-pay | Admitting: "Endocrinology

## 2023-02-23 MED ORDER — MELOXICAM 15 MG PO TABS: 15.0000 mg | ORAL_TABLET | Freq: Every day | ORAL | 0 refills | Status: DC

## 2023-02-23 NOTE — Telephone Encounter (Signed)
Are you alright to refill Meloxicam?

## 2023-02-24 ENCOUNTER — Other Ambulatory Visit: Payer: Self-pay | Admitting: Family Medicine

## 2023-02-27 ENCOUNTER — Other Ambulatory Visit: Payer: BC Managed Care – PPO

## 2023-02-27 DIAGNOSIS — E119 Type 2 diabetes mellitus without complications: Secondary | ICD-10-CM | POA: Diagnosis not present

## 2023-02-27 DIAGNOSIS — N400 Enlarged prostate without lower urinary tract symptoms: Secondary | ICD-10-CM | POA: Diagnosis not present

## 2023-02-27 DIAGNOSIS — E291 Testicular hypofunction: Secondary | ICD-10-CM | POA: Diagnosis not present

## 2023-02-28 DIAGNOSIS — Z95818 Presence of other cardiac implants and grafts: Secondary | ICD-10-CM | POA: Diagnosis not present

## 2023-03-02 ENCOUNTER — Other Ambulatory Visit: Payer: Self-pay | Admitting: Family Medicine

## 2023-03-04 LAB — TESTOSTERONE, FREE, TOTAL, SHBG
Sex Hormone Binding: 65.3 nmol/L (ref 19.3–76.4)
Testosterone, Free: 5.8 pg/mL — ABNORMAL LOW (ref 6.6–18.1)
Testosterone: 530 ng/dL (ref 264–916)

## 2023-03-04 LAB — PSA: Prostate Specific Ag, Serum: 0.5 ng/mL (ref 0.0–4.0)

## 2023-03-05 ENCOUNTER — Encounter: Payer: Self-pay | Admitting: Family Medicine

## 2023-03-05 ENCOUNTER — Ambulatory Visit: Payer: BC Managed Care – PPO | Admitting: Family Medicine

## 2023-03-05 VITALS — BP 125/75 | HR 82 | Ht 68.5 in | Wt 177.0 lb

## 2023-03-05 DIAGNOSIS — E785 Hyperlipidemia, unspecified: Secondary | ICD-10-CM | POA: Diagnosis not present

## 2023-03-05 DIAGNOSIS — E119 Type 2 diabetes mellitus without complications: Secondary | ICD-10-CM | POA: Diagnosis not present

## 2023-03-05 DIAGNOSIS — I10 Essential (primary) hypertension: Secondary | ICD-10-CM

## 2023-03-05 DIAGNOSIS — E782 Mixed hyperlipidemia: Secondary | ICD-10-CM | POA: Diagnosis not present

## 2023-03-05 MED ORDER — MELOXICAM 15 MG PO TABS: 15.0000 mg | ORAL_TABLET | Freq: Every day | ORAL | 3 refills | Status: DC

## 2023-03-05 MED ORDER — PRAVASTATIN SODIUM 80 MG PO TABS
80.0000 mg | ORAL_TABLET | Freq: Every day | ORAL | 3 refills | Status: AC
Start: 2023-03-05 — End: ?

## 2023-03-05 MED ORDER — LOSARTAN POTASSIUM 50 MG PO TABS: 50.0000 mg | ORAL_TABLET | Freq: Every day | ORAL | 3 refills | Status: AC

## 2023-03-05 MED ORDER — FUROSEMIDE 20 MG PO TABS: 20.0000 mg | ORAL_TABLET | Freq: Every day | ORAL | 3 refills | Status: AC | PRN

## 2023-03-05 MED ORDER — TAMSULOSIN HCL 0.4 MG PO CAPS: ORAL_CAPSULE | ORAL | 3 refills | Status: AC

## 2023-03-05 NOTE — Progress Notes (Signed)
 BP 125/75   Pulse 82   Ht 5' 8.5" (1.74 m)   Wt 177 lb (80.3 kg)   SpO2 96%   BMI 26.52 kg/m    Subjective:   Patient ID: Johnny Mathews, male    DOB: 1960-05-26, 63 y.o.   MRN: [id number]  HPI: Johnny Mathews is a 63 y.o. male presenting on 03/05/2023 for Medical Management of Chronic Issues and Diabetes   HPI Type 2 diabetes mellitus Patient comes in today for recheck of his diabetes. Patient has been currently taking no medicine currently, diet control. Patient is currently on an ACE inhibitor/ARB. Patient has not seen an ophthalmologist this year. Patient denies any new issues with their feet. The symptom started onset as an adult hypertension and hyperlipidemia ARE RELATED TO DM   Hypertension Patient is currently on furosemide and losartan call in near, and their blood pressure today is 125/75. Patient denies any lightheadedness or dizziness. Patient denies headaches, blurred vision, chest pains, shortness of breath, or weakness. Denies any side effects from medication and is content with current medication.   Hyperlipidemia Patient is coming in for recheck of his hyperlipidemia. The patient is currently taking pravastatin. They deny any issues with myalgias or history of liver damage from it. They deny any focal numbness or weakness or chest pain.   Relevant past medical, surgical, family and social history reviewed and updated as indicated. Interim medical history since our last visit reviewed. Allergies and medications reviewed and updated.  Review of Systems  Constitutional:  Negative for chills and fever.  Eyes:  Negative for visual disturbance.  Respiratory:  Negative for shortness of breath and wheezing.   Cardiovascular:  Negative for chest pain and leg swelling.  Musculoskeletal:  Negative for back pain and gait problem.  Skin:  Negative for rash.  Neurological:  Negative for dizziness, weakness and light-headedness.  All other systems reviewed and are  negative.   Per HPI unless specifically indicated above   Allergies as of 03/05/2023       Reactions   Egg-derived Products Swelling   Throat- 03-31-2019 pt states can eat cakes and Pie with no issues  Throat- 03-31-2019 pt states can eat cakes and Pie with no issues    Septra [sulfamethoxazole-trimethoprim] Swelling   throat   Sulfonamide Derivatives Hives        Medication List        Accurate as of March 05, 2023 12:08 PM. If you have any questions, ask your nurse or doctor.          albuterol (2.5 MG/3ML) 0.083% nebulizer solution Commonly known as: PROVENTIL NEBULIZE 1 VIAL EVERY 6 HOURS AS NEEDED FOR WHEEZING & SHORTNESS OF BREATH   albuterol 108 (90 Base) MCG/ACT inhaler Commonly known as: VENTOLIN HFA INHALE 2 PUFFS EVERY 4 HOURS AS NEEDED   amitriptyline 100 MG tablet Commonly known as: ELAVIL Take 2 tablets (200 mg total) by mouth at bedtime. **NEEDS TO BE SEEN BEFORE NEXT REFILL**   azelastine 0.1 % nasal spray Commonly known as: ASTELIN USE 1 SPRAY IN EACH NOSTRIL TWICE DAILY AS NEEDED   blood glucose meter kit and supplies Dispense based on patient and insurance preference. Use up to four times daily as directed. (FOR ICD-10 E10.9, E11.9). Pt states needs One Touch Verio Meter and one touch delica plus test strips   Breztri Aerosphere 160-9-4.8 MCG/ACT Aero Generic drug: Budeson-Glycopyrrol-Formoterol USE 2 PUFFS EVERY MORNING AND AT BEDTIME   Contour Blood Glucose System w/Device  Kit Test blood sugars four times daily   cyclobenzaprine 10 MG tablet Commonly known as: FLEXERIL Take 1 tablet (10 mg total) by mouth 3 (three) times daily as needed for muscle spasms. Take 1 tablet by mouth 3 times daily as needed for muscle spasm. Warning: May cause drowsiness.   diclofenac Sodium 1 % Gel Commonly known as: Voltaren Apply 2 g topically 4 (four) times daily.   EPINEPHrine 0.3 mg/0.3 mL Soaj injection Commonly known as: EPI-PEN INJECT 0.3ML  (0.3MG ) IM ONCE   famotidine 20 MG tablet Commonly known as: PEPCID Take 1 tablet (20 mg total) by mouth 2 (two) times daily.   ferrous sulfate 325 (65 FE) MG tablet Take 325 mg by mouth daily with breakfast.   finasteride 5 MG tablet Commonly known as: PROSCAR TAKE ONE (1) TABLET EACH DAY   fluticasone 50 MCG/ACT nasal spray Commonly known as: FLONASE USE 1 TO 2 SPRAYS IN EACH NOSTRIL DAILY   furosemide 20 MG tablet Commonly known as: LASIX Take 1 tablet (20 mg total) by mouth daily as needed.   glucose blood test strip Commonly known as: Contour Next Test Test blood sugars four times daily   ipratropium 0.06 % nasal spray Commonly known as: ATROVENT 2 sprays into each nostril Three (3) times a day.   losartan 50 MG tablet Commonly known as: COZAAR Take 1 tablet (50 mg total) by mouth daily. What changed: how much to take Changed by: Elige Radon Alina Gilkey   meloxicam 15 MG tablet Commonly known as: MOBIC Take 1 tablet (15 mg total) by mouth daily.   montelukast 10 MG tablet Commonly known as: SINGULAIR TAKE ONE TABLET DAILY AT BEDTIME   OneTouch Delica Plus Lancet33G Misc   pravastatin 80 MG tablet Commonly known as: PRAVACHOL Take 1 tablet (80 mg total) by mouth daily. What changed: additional instructions Changed by: Elige Radon Zacari Radick   Semaglutide (1 MG/DOSE) 4 MG/3ML Sopn Inject 1 mg as directed once a week.   Spacer/Aero-Holding [employer] Use with inhaler daily   SYRINGE-NEEDLE (DISP) 3 ML 21G X 1-1/2" 3 ML Misc Use to inject testosterone every week   tadalafil 10 MG tablet Commonly known as: CIALIS TAKE 1 TABLET BY MOUTH EVERY OTHER DAY AS NEEDED   tamsulosin 0.4 MG Caps capsule Commonly known as: FLOMAX TAKE 1 CAPSULE DAILY AFTER SUPPER   testosterone cypionate 200 MG/ML injection Commonly known as: DEPOTESTOSTERONE CYPIONATE INJECT 100MG  (0.5ML) EVERY 10 DAYS (FOR INTRAMUSCULAR USE ONLY)   traZODone 50 MG tablet Commonly known as:  DESYREL Take 0.5-1 tablets (25-50 mg total) by mouth at bedtime as needed for sleep.         Objective:   BP 125/75   Pulse 82   Ht 5' 8.5" (1.74 m)   Wt 177 lb (80.3 kg)   SpO2 96%   BMI 26.52 kg/m   Wt Readings from Last 3 Encounters:  03/05/23 177 lb (80.3 kg)  02/04/23 179 lb (81.2 kg)  12/11/22 180 lb (81.6 kg)    Physical Exam Vitals and nursing note reviewed.  Constitutional:      General: He is not in acute distress.    Appearance: He is well-developed. He is not diaphoretic.  Eyes:     General: No scleral icterus.    Conjunctiva/sclera: Conjunctivae normal.  Neck:     Thyroid: No thyromegaly.  Cardiovascular:     Rate and Rhythm: Normal rate and regular rhythm.     Heart sounds: Normal heart sounds. No  murmur heard. Pulmonary:     Effort: Pulmonary effort is normal. No respiratory distress.     Breath sounds: Normal breath sounds. No wheezing.  Musculoskeletal:        General: No swelling. Normal range of motion.     Cervical back: Neck supple.  Lymphadenopathy:     Cervical: No cervical adenopathy.  Skin:    General: Skin is warm and dry.     Findings: No rash.  Neurological:     Mental Status: He is alert and oriented to person, place, and time.     Coordination: Coordination normal.  Psychiatric:        Behavior: Behavior normal.       Assessment & Plan:   Problem List Items Addressed This Visit       Cardiovascular and Mediastinum   Essential hypertension   Relevant Medications   furosemide (LASIX) 20 MG tablet   losartan (COZAAR) 50 MG tablet   pravastatin (PRAVACHOL) 80 MG tablet   Other Relevant Orders   CBC with Differential/Platelet   CMP14+EGFR   Lipid panel   Bayer DCA Hb A1c Waived     Endocrine   Type 2 diabetes mellitus without complication, without long-term current use of insulin (HCC) - Primary   Relevant Medications   losartan (COZAAR) 50 MG tablet   pravastatin (PRAVACHOL) 80 MG tablet   Other Relevant Orders    CBC with Differential/Platelet   CMP14+EGFR   Lipid panel   Bayer DCA Hb A1c Waived   Hgb A1c w/o eAG     Other   Mixed hyperlipidemia   Relevant Medications   furosemide (LASIX) 20 MG tablet   losartan (COZAAR) 50 MG tablet   pravastatin (PRAVACHOL) 80 MG tablet   Other Visit Diagnoses       Hyperlipidemia with target LDL less than 100       Relevant Medications   furosemide (LASIX) 20 MG tablet   losartan (COZAAR) 50 MG tablet   pravastatin (PRAVACHOL) 80 MG tablet   Other Relevant Orders   CBC with Differential/Platelet   CMP14+EGFR   Lipid panel   Bayer DCA Hb A1c Waived     Hyperlipidemia, unspecified hyperlipidemia type       Relevant Medications   furosemide (LASIX) 20 MG tablet   losartan (COZAAR) 50 MG tablet   pravastatin (PRAVACHOL) 80 MG tablet       A1c and blood work was sent out, will watch for the results.  Blood pressure and everything else looks good today.  No changes   Follow up plan: Return in about 3 months (around 06/02/2023), or if symptoms worsen or fail to improve, for Diabetes and hypertension and cholesterol.  Counseling provided for all of the vaccine components Orders Placed This Encounter  Procedures   CBC with Differential/Platelet   CMP14+EGFR   Lipid panel   Bayer DCA Hb A1c Waived   Hgb A1c w/o eAG    Arville Care, MD [facility] 03/05/2023, 12:08 PM

## 2023-03-06 LAB — CMP14+EGFR
ALT: 26 [IU]/L (ref 0–44)
AST: 31 [IU]/L (ref 0–40)
Albumin: 4.3 g/dL (ref 3.9–4.9)
Alkaline Phosphatase: 154 [IU]/L — ABNORMAL HIGH (ref 44–121)
BUN/Creatinine Ratio: 15 (ref 10–24)
BUN: 18 mg/dL (ref 8–27)
Bilirubin Total: 0.3 mg/dL (ref 0.0–1.2)
CO2: 22 mmol/L (ref 20–29)
Calcium: 9.8 mg/dL (ref 8.6–10.2)
Chloride: 105 mmol/L (ref 96–106)
Creatinine, Ser: 1.24 mg/dL (ref 0.76–1.27)
Globulin, Total: 2.4 g/dL (ref 1.5–4.5)
Glucose: 71 mg/dL (ref 70–99)
Potassium: 4.8 mmol/L (ref 3.5–5.2)
Sodium: 141 mmol/L (ref 134–144)
Total Protein: 6.7 g/dL (ref 6.0–8.5)
eGFR: 65 mL/min/{1.73_m2} (ref >59)

## 2023-03-06 LAB — LIPID PANEL
Chol/HDL Ratio: 2.2 {ratio} (ref 0.0–5.0)
Cholesterol, Total: 164 mg/dL (ref 100–199)
HDL: 73 mg/dL (ref >39)
LDL Chol Calc (NIH): 77 mg/dL (ref 0–99)
Triglycerides: 71 mg/dL (ref 0–149)
VLDL Cholesterol Cal: 14 mg/dL (ref 5–40)

## 2023-03-06 LAB — CBC WITH DIFFERENTIAL/PLATELET
Basophils Absolute: 0 10*3/uL (ref 0.0–0.2)
Basos: 1 %
EOS (ABSOLUTE): 0.2 10*3/uL (ref 0.0–0.4)
Eos: 4 %
Hematocrit: 39.7 % (ref 37.5–51.0)
Hemoglobin: 13.2 g/dL (ref 13.0–17.7)
Immature Grans (Abs): 0 10*3/uL (ref 0.0–0.1)
Immature Granulocytes: 1 %
Lymphocytes Absolute: 1.4 10*3/uL (ref 0.7–3.1)
Lymphs: 24 %
MCH: 31.7 pg (ref 26.6–33.0)
MCHC: 33.2 g/dL (ref 31.5–35.7)
MCV: 95 fL (ref 79–97)
Monocytes Absolute: 0.5 10*3/uL (ref 0.1–0.9)
Monocytes: 9 %
Neutrophils Absolute: 3.5 10*3/uL (ref 1.4–7.0)
Neutrophils: 61 %
Platelets: 247 10*3/uL (ref 150–450)
RBC: 4.17 x10E6/uL (ref 4.14–5.80)
RDW: 12.9 % (ref 11.6–15.4)
WBC: 5.7 10*3/uL (ref 3.4–10.8)

## 2023-03-06 LAB — HGB A1C W/O EAG: Hgb A1c MFr Bld: 4.8 % (ref 4.8–5.6)

## 2023-03-10 ENCOUNTER — Ambulatory Visit: Payer: BC Managed Care – PPO | Admitting: "Endocrinology

## 2023-03-10 ENCOUNTER — Encounter: Payer: Self-pay | Admitting: "Endocrinology

## 2023-03-10 VITALS — BP 110/68 | HR 72 | Ht 68.5 in | Wt 179.2 lb

## 2023-03-10 DIAGNOSIS — E119 Type 2 diabetes mellitus without complications: Secondary | ICD-10-CM | POA: Diagnosis not present

## 2023-03-10 DIAGNOSIS — Z7985 Long-term (current) use of injectable non-insulin antidiabetic drugs: Secondary | ICD-10-CM | POA: Diagnosis not present

## 2023-03-10 DIAGNOSIS — E782 Mixed hyperlipidemia: Secondary | ICD-10-CM | POA: Diagnosis not present

## 2023-03-10 DIAGNOSIS — E291 Testicular hypofunction: Secondary | ICD-10-CM

## 2023-03-10 MED ORDER — TESTOSTERONE CYPIONATE 200 MG/ML IM SOLN: INTRAMUSCULAR | 0 refills | Status: DC

## 2023-03-10 NOTE — Progress Notes (Signed)
 03/10/2023     Endocrinology follow-up note   Johnny Mathews, 63 y.o., male   Chief Complaint  Patient presents with   Follow-up    Hypogonadism, male     Past Medical History:  Diagnosis Date   Allergy    Alpha galactosidase deficiency    Anemia    past hx    Arthritis    Asthma    Blood transfusion without reported diagnosis    BPH (benign prostatic hypertrophy)    Cataract    removed left eye    Complication of anesthesia    Diabetes mellitus without complication (HCC)    Dysrhythmia    GERD (gastroesophageal reflux disease)    Heart murmur    History of kidney stones    HOH (hard of hearing)    Hyperlipidemia    Hypertension    Irregular heart beat    Muscular dystrophy (HCC)    PONV (postoperative nausea and vomiting)    Sleep apnea    Wears hearing aid in both ears    Past Surgical History:  Procedure Laterality Date   CATARACT EXTRACTION Left    COLONOSCOPY     ESOPHAGOGASTRODUODENOSCOPY N/A 08/27/2020   Procedure: ESOPHAGOGASTRODUODENOSCOPY (EGD);  Surgeon: Corliss Skains, MD;  Location: [facility] OR;  Service: Thoracic;  Laterality: N/A;   FEMUR FRACTURE SURGERY     INGUINAL HERNIA REPAIR Right 08/07/2021   Procedure: HERNIA REPAIR INGUINAL ADULT WITH MESH;  Surgeon: Lucretia Roers, MD;  Location: [facility];  Service: General;  Laterality: Right;   INSERTION OF MESH N/A 08/27/2020   Procedure: INSERTION OF ACELL 7.5 x 6cm GENTRIX SURGICAL MATRIX HIATAL MESH;  Surgeon: Corliss Skains, MD;  Location: [facility] OR;  Service: Thoracic;  Laterality: N/A;   KIDNEY STONE SURGERY     x6   KNEE SURGERY Right    x2   MASS EXCISION Right 08/02/2018   Procedure: EXCISION RIGHT LONG FINGER MASS, DEBRIDEMENT PROXIMAL INTERPHALANGEAL JOINT WITH ROTATION FLAP;  Surgeon: Betha Loa, MD;  Location: [facility];  Service: Orthopedics;  Laterality: Right;   NISSEN FUNDOPLICATION     [facility]    SHOULDER OPEN  ROTATOR CUFF REPAIR     XI ROBOTIC ASSISTED HIATAL HERNIA REPAIR N/A 08/27/2020   Procedure: XI ROBOTIC ASSISTED REDO HIATAL HERNIA REPAIR;  Surgeon: Corliss Skains, MD;  Location: [facility] OR;  Service: Thoracic;  Laterality: N/A;   Social History   Socioeconomic History   Marital status: Married    Spouse name: Not on file   Number of children: Not on file   Years of education: Not on file   Highest education level: Not on file  Occupational History   Occupation: Designer, industrial/product  Tobacco Use   Smoking status: Former    Current packs/day: 0.00    Average packs/day: 1 pack/day for 10.0 years (10.0 ttl pk-yrs)    Types: Cigarettes    Start date: 01/07/1984    Quit date: 01/06/1994    Years since quitting: 29.1   Smokeless tobacco: Current    Types: Snuff  Vaping Use   Vaping status: Never Used  Substance and Sexual Activity   Alcohol use: Yes    Alcohol/week: 2.0 - 3.0 standard drinks of alcohol    Types: 2 - 3 Standard drinks or equivalent per week    Comment: social   Drug use: No   Sexual activity: Not on file  Other Topics Concern   Not on file  Social  History Narrative   Not on file   Social Drivers of Health   Financial Resource Strain: Not on file  Food Insecurity: Not on file  Transportation Needs: Not on file  Physical Activity: Not on file  Stress: Not on file  Social Connections: Not on file   Outpatient Encounter Medications as of 03/10/2023  Medication Sig   albuterol (PROVENTIL) (2.5 MG/3ML) 0.083% nebulizer solution NEBULIZE 1 VIAL EVERY 6 HOURS AS NEEDED FOR WHEEZING & SHORTNESS OF BREATH   albuterol (VENTOLIN HFA) 108 (90 Base) MCG/ACT inhaler INHALE 2 PUFFS EVERY 4 HOURS AS NEEDED   amitriptyline (ELAVIL) 100 MG tablet Take 2 tablets (200 mg total) by mouth at bedtime. **NEEDS TO BE SEEN BEFORE NEXT REFILL**   azelastine (ASTELIN) 0.1 % nasal spray USE 1 SPRAY IN EACH NOSTRIL TWICE DAILY AS NEEDED   blood glucose meter kit and supplies Dispense based  on patient and insurance preference. Use up to four times daily as directed. (FOR ICD-10 E10.9, E11.9). Pt states needs One Touch Verio Meter and one touch delica plus test strips   Blood Glucose Monitoring Suppl (CONTOUR BLOOD GLUCOSE SYSTEM) w/Device KIT Test blood sugars four times daily   BREZTRI AEROSPHERE 160-9-4.8 MCG/ACT AERO USE 2 PUFFS EVERY MORNING AND AT BEDTIME   cyclobenzaprine (FLEXERIL) 10 MG tablet Take 1 tablet (10 mg total) by mouth 3 (three) times daily as needed for muscle spasms. Take 1 tablet by mouth 3 times daily as needed for muscle spasm. Warning: May cause drowsiness.   diclofenac Sodium (VOLTAREN) 1 % GEL Apply 2 g topically 4 (four) times daily.   EPINEPHRINE 0.3 mg/0.3 mL IJ SOAJ injection INJECT 0.3ML (0.3MG ) IM ONCE   famotidine (PEPCID) 20 MG tablet Take 1 tablet (20 mg total) by mouth 2 (two) times daily.   ferrous sulfate 325 (65 FE) MG tablet Take 325 mg by mouth daily with breakfast.   finasteride (PROSCAR) 5 MG tablet TAKE ONE (1) TABLET EACH DAY   fluticasone (FLONASE) 50 MCG/ACT nasal spray USE 1 TO 2 SPRAYS IN EACH NOSTRIL DAILY   furosemide (LASIX) 20 MG tablet Take 1 tablet (20 mg total) by mouth daily as needed.   glucose blood (CONTOUR NEXT TEST) test strip Test blood sugars four times daily   ipratropium (ATROVENT) 0.06 % nasal spray 2 sprays into each nostril Three (3) times a day.   Lancets (ONETOUCH DELICA PLUS LANCET33G) MISC    losartan (COZAAR) 50 MG tablet Take 1 tablet (50 mg total) by mouth daily.   meloxicam (MOBIC) 15 MG tablet Take 1 tablet (15 mg total) by mouth daily.   montelukast (SINGULAIR) 10 MG tablet TAKE ONE TABLET DAILY AT BEDTIME   pravastatin (PRAVACHOL) 80 MG tablet Take 1 tablet (80 mg total) by mouth daily.   Semaglutide, 1 MG/DOSE, 4 MG/3ML SOPN Inject 1 mg as directed once a week.   Spacer/Aero-Holding Rudean Curt Use with inhaler daily   SYRINGE-NEEDLE, DISP, 3 ML 21G X 1-1/2" 3 ML MISC Use to inject testosterone every  week   tadalafil (CIALIS) 10 MG tablet TAKE 1 TABLET BY MOUTH EVERY OTHER DAY AS NEEDED   tamsulosin (FLOMAX) 0.4 MG CAPS capsule TAKE 1 CAPSULE DAILY AFTER SUPPER   testosterone cypionate (DEPOTESTOSTERONE CYPIONATE) 200 MG/ML injection INJECT 100MG  (0.5ML) EVERY 10 DAYS (FOR INTRAMUSCULAR USE ONLY)   traZODone (DESYREL) 50 MG tablet Take 0.5-1 tablets (25-50 mg total) by mouth at bedtime as needed for sleep.   [DISCONTINUED] testosterone cypionate (DEPOTESTOSTERONE CYPIONATE) 200  MG/ML injection INJECT 100MG  (0.5ML) EVERY 10 DAYS (FOR INTRAMUSCULAR USE ONLY)   No facility-administered encounter medications on file as of 03/10/2023.   ALLERGIES: Allergies  Allergen Reactions   Egg-Derived Products Swelling    Throat- 03-31-2019 pt states can eat cakes and Pie with no issues  Throat- 03-31-2019 pt states can eat cakes and Pie with no issues    Septra [Sulfamethoxazole-Trimethoprim] Swelling    throat   Sulfonamide Derivatives Hives    VACCINATION STATUS: Immunization History  Administered Date(s) Administered   Influenza Inj Mdck Quad Pf 09/24/2018, 10/22/2020   Influenza, Quadrivalent, Recombinant, Inj, Pf 01/15/2017   Influenza, Seasonal, Injecte, Preservative Fre 12/11/2022   Influenza-Unspecified 09/24/2018, 10/31/2019   Moderna SARS-COV2 Booster Vaccination 04/26/2020   Moderna Sars-Covid-2 Vaccination 01/15/2019, 02/12/2019, 09/07/2019   Pneumococcal Polysaccharide-23 11/09/2019   Tdap 07/20/2009, 11/09/2019   Zoster Recombinant(Shingrix) 08/22/2020, 02/22/2021    HPI: Johnny Mathews is a 62 y.o.-year-old man.  He is returning for a follow-up after he was seen in consultation for hypogonadism.  He does not have new complaints today.  His previsit labs show improved total testosterone of 530, currently on testosterone 100 mg every 10 days IM. He notes from previous visits. He reports that he has hypogonadism for as long as he remembers, does not father any biological children  although he adopted 3 kids.  He reports to have been told that he cannot father any children due to low sperm count.  He reports consistency with injection of his testosterone every other week.    During his last visit, he was found to have tachycardia up to 120 , was sent to ER where he received cardioversion. He gives history of tachyarrhythmia which required ablative therapy in the past.  He denies  trauma to testes,  chemotherapy,  testicular irradiation,  nor genitourinary surgery. Denies new complaints since last visit.  He wishes to be continued on testosterone treatment.   -His recent labs show normal CBC, PSA   -  He has asthma on various inhalers. No chronic pain. Not on opiates, does not take steroids.    He does not have family  Or personal history of premature  cardiac disease.  ROS: Limited as above.  PE: BP 110/68   Pulse 72   Ht 5' 8.5" (1.74 m)   Wt 179 lb 3.2 oz (81.3 kg)   BMI 26.85 kg/m  Wt Readings from Last 3 Encounters:  03/10/23 179 lb 3.2 oz (81.3 kg)  03/05/23 177 lb (80.3 kg)  02/04/23 179 lb (81.2 kg)    Genital exam: normal male escutcheon, no inguinal LAD, normal phallus, significantly shrunk testes bilaterally to 5 mL, no testicular or scrotal mass lesions.  no penile discharge.  No gynecomastia.   CMP ( most recent) CMP     Component Value Date/Time   NA 141 03/05/2023 1146   K 4.8 03/05/2023 1146   CL 105 03/05/2023 1146   CO2 22 03/05/2023 1146   GLUCOSE 71 03/05/2023 1146   GLUCOSE 78 11/10/2022 1201   BUN 18 03/05/2023 1146   CREATININE 1.24 03/05/2023 1146   CALCIUM 9.8 03/05/2023 1146   PROT 6.7 03/05/2023 1146   ALBUMIN 4.3 03/05/2023 1146   AST 31 03/05/2023 1146   ALT 26 03/05/2023 1146   ALKPHOS 154 (H) 03/05/2023 1146   BILITOT 0.3 03/05/2023 1146   GFRNONAA >60 11/10/2022 1201   GFRAA 74 02/23/2020 0929     Diabetic Labs (most recent): Lab Results  Component  Value Date   HGBA1C 4.8 03/05/2023   HGBA1C 5.2  03/13/2022   HGBA1C 5.2 08/23/2021     Lipid Panel ( most recent) Lipid Panel     Component Value Date/Time   CHOL 164 03/05/2023 1146   TRIG 71 03/05/2023 1146   HDL 73 03/05/2023 1146   CHOLHDL 2.2 03/05/2023 1146   CHOLHDL 4.8 03/30/2009 1807   VLDL 20 03/30/2009 1807   LDLCALC 77 03/05/2023 1146    Recent Results (from the past 2160 hours)  Testosterone, Free, Total, SHBG     Status: Abnormal   Collection Time: 02/27/23 12:36 PM  Result Value Ref Range   Testosterone 530 264 - 916 ng/dL    Comment: Adult male reference interval is based on a population of healthy nonobese males (BMI <30) between 61 and 105 years old. Travison, et.al. JCEM [phone]. PMID: [id number].    Testosterone, Free 5.8 (L) 6.6 - 18.1 pg/mL   Sex Hormone Binding 65.3 19.3 - 76.4 nmol/L  PSA     Status: None   Collection Time: 02/27/23 12:36 PM  Result Value Ref Range   Prostate Specific Ag, Serum 0.5 0.0 - 4.0 ng/mL    Comment: Roche ECLIA methodology. According to the American Urological Association, Serum PSA should decrease and remain at undetectable levels after radical prostatectomy. The AUA defines biochemical recurrence as an initial PSA value 0.2 ng/mL or greater followed by a subsequent confirmatory PSA value 0.2 ng/mL or greater. Values obtained with different assay methods or kits cannot be used interchangeably. Results cannot be interpreted as absolute evidence of the presence or absence of malignant disease.   CBC with Differential/Platelet     Status: None   Collection Time: 03/05/23 11:46 AM  Result Value Ref Range   WBC 5.7 3.4 - 10.8 x10E3/uL   RBC 4.17 4.14 - 5.80 x10E6/uL   Hemoglobin 13.2 13.0 - 17.7 g/dL   Hematocrit 62.1 30.8 - 51.0 %   MCV 95 79 - 97 fL   MCH 31.7 26.6 - 33.0 pg   MCHC 33.2 31.5 - 35.7 g/dL   RDW 65.7 84.6 - 96.2 %   Platelets 247 150 - 450 x10E3/uL   Neutrophils 61 Not Estab. %   Lymphs 24 Not Estab. %   Monocytes 9 Not Estab. %   Eos 4  Not Estab. %   Basos 1 Not Estab. %   Neutrophils Absolute 3.5 1.4 - 7.0 x10E3/uL   Lymphocytes Absolute 1.4 0.7 - 3.1 x10E3/uL   Monocytes Absolute 0.5 0.1 - 0.9 x10E3/uL   EOS (ABSOLUTE) 0.2 0.0 - 0.4 x10E3/uL   Basophils Absolute 0.0 0.0 - 0.2 x10E3/uL   Immature Granulocytes 1 Not Estab. %   Immature Grans (Abs) 0.0 0.0 - 0.1 x10E3/uL  CMP14+EGFR     Status: Abnormal   Collection Time: 03/05/23 11:46 AM  Result Value Ref Range   Glucose 71 70 - 99 mg/dL   BUN 18 8 - 27 mg/dL   Creatinine, Ser 9.52 0.76 - 1.27 mg/dL   eGFR 65 >84 XL/KGM/0.10   BUN/Creatinine Ratio 15 10 - 24   Sodium 141 134 - 144 mmol/L   Potassium 4.8 3.5 - 5.2 mmol/L   Chloride 105 96 - 106 mmol/L   CO2 22 20 - 29 mmol/L   Calcium 9.8 8.6 - 10.2 mg/dL   Total Protein 6.7 6.0 - 8.5 g/dL   Albumin 4.3 3.9 - 4.9 g/dL   Globulin, Total 2.4 1.5 - 4.5 g/dL  Bilirubin Total 0.3 0.0 - 1.2 mg/dL   Alkaline Phosphatase 154 (H) 44 - 121 IU/L   AST 31 0 - 40 IU/L   ALT 26 0 - 44 IU/L  Lipid panel     Status: None   Collection Time: 03/05/23 11:46 AM  Result Value Ref Range   Cholesterol, Total 164 100 - 199 mg/dL   Triglycerides 71 0 - 149 mg/dL   HDL 73 >96 mg/dL   VLDL Cholesterol Cal 14 5 - 40 mg/dL   LDL Chol Calc (NIH) 77 0 - 99 mg/dL   Chol/HDL Ratio 2.2 0.0 - 5.0 ratio    Comment:                                   T. Chol/HDL Ratio                                             Men  Women                               1/2 Avg.Risk  3.4    3.3                                   Avg.Risk  5.0    4.4                                2X Avg.Risk  9.6    7.1                                3X Avg.Risk 23.4   11.0   Hgb A1c w/o eAG     Status: None   Collection Time: 03/05/23 11:46 AM  Result Value Ref Range   Hgb A1c MFr Bld 4.8 4.8 - 5.6 %    Comment:          Prediabetes: 5.7 - 6.4          Diabetes: >6.4          Glycemic control for adults with diabetes: <7.0      ASSESSMENT:  1.  Hypogonadism   PLAN:   See notes from previous visits.  He returns with acceptable total testosterone level at 530, progressively improving from 93.  He is advised to continue testosterone  100 mg IM every 10 days with plan to repeat total testosterone before his next visit in 4 months.  This will give him a total of 300 mg of testosterone monthly.    -  I discussed adverse effects of unnecessary testosterone replacement short-term and long-term.  In this patient with bilaterally shrunk testicles, his hypogonadism is likely chronic and primary. I discussed with him the fact that testosterone replacement will further diminish his chance of fertility.  At this point, he is not interested to keep his fertility.  -Given his medical history of sleep apnea and BPH which are relative contraindications for testosterone replacement, his testosterone dose will be titrated slowly based on his clinical response.    -He is already on PDE 5  inhibitors for ED.  He is advised to stay on pravastatin 80 mg p.o. nightly for hyperlipidemia.  Patient does have history of diabetes,  which seems to have reversed.  His A1c was 4.8% in May 2023.  He remains on Trulicity 0.75 mg subcutaneously weekly.  He is being cared for for by his PMD for his diabetes.   He is encouraged to keep close follow-up with his PMD.   I spent  20 minutes in the care of the patient today including review of labs from Thyroid Function, CMP, and other relevant labs ; imaging/biopsy records (current and previous including abstractions from other facilities); face-to-face time discussing  his lab results and symptoms, medications doses, his options of short and long term treatment based on the latest standards of care / guidelines;   and documenting the encounter.  Johnny Mathews  participated in the discussions, expressed understanding, and voiced agreement with the above plans.  All questions were answered to his satisfaction. he is  encouraged to contact clinic should he have any questions or concerns prior to his return visit.     Return in about 4 months (around 07/10/2023) for Fasting Labs  in AM B4 8.  Marquis Lunch, MD [facility] [address] [place], [place] [address] Phone: [phone]  Fax: [phone]   03/10/2023, 1:19 PM  This note was partially dictated with voice recognition software. Similar sounding words can be transcribed inadequately or may not  be corrected upon review.

## 2023-03-12 ENCOUNTER — Encounter: Payer: Self-pay | Admitting: Family Medicine

## 2023-03-16 ENCOUNTER — Other Ambulatory Visit: Payer: Self-pay | Admitting: Family Medicine

## 2023-03-20 ENCOUNTER — Other Ambulatory Visit: Payer: Self-pay | Admitting: Family Medicine

## 2023-03-20 DIAGNOSIS — G729 Myopathy, unspecified: Secondary | ICD-10-CM | POA: Insufficient documentation

## 2023-03-20 DIAGNOSIS — J45909 Unspecified asthma, uncomplicated: Secondary | ICD-10-CM | POA: Insufficient documentation

## 2023-03-23 ENCOUNTER — Ambulatory Visit: Payer: Self-pay | Admitting: Family Medicine

## 2023-03-23 IMAGING — RF DG ESOPHAGUS
4 series · 16 of 16 positions shown · non-contrast
Comparison: Chest CT 08/07/2020

CLINICAL DATA: Rule out esophageal leak

EXAM:
ESOPHOGRAM/BARIUM SWALLOW
TECHNIQUE: Single contrast examination was performed using water-soluble
contrast.
FLUOROSCOPY TIME:  Fluoroscopy Time:  1 minutes 6 seconds
Radiation Exposure Index (if provided by the fluoroscopic device):
17.8 mGy
Number of Acquired Spot Images: 0

[Series 1: cp_standard · 0.51mm/px · 4 of 96 frames shown (1 of 4)]
[frame 15/96]
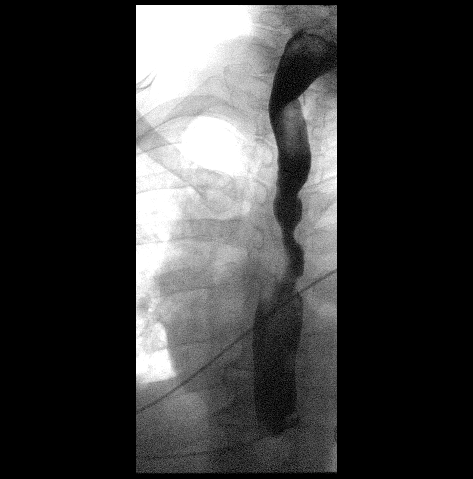
[frame 16/96]
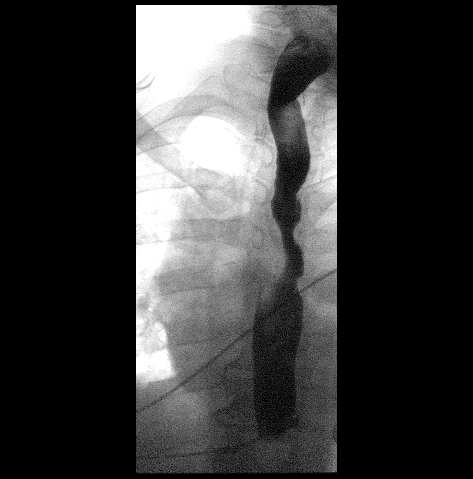
[frame 49/96]
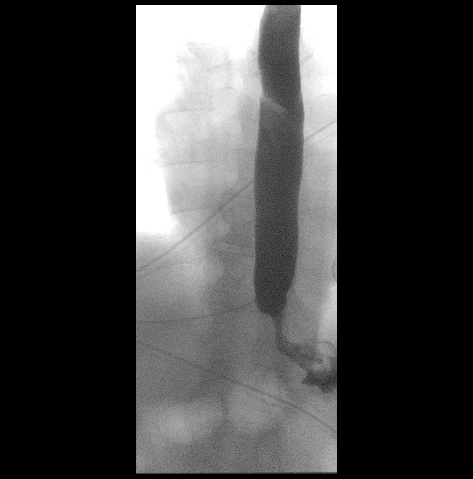
[frame 82/96]
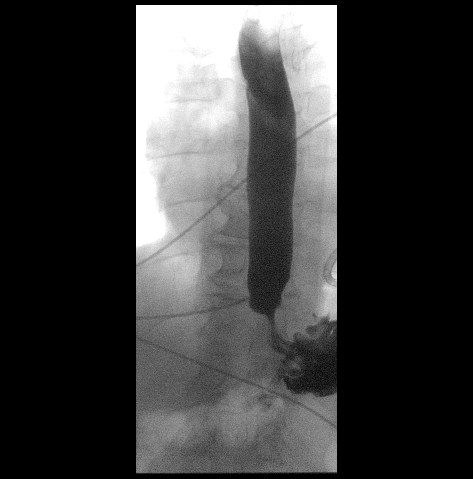

[Series 2: cp_standard · 0.51mm/px · 4 of 282 frames shown (2 of 4)]
[frame 13/282]
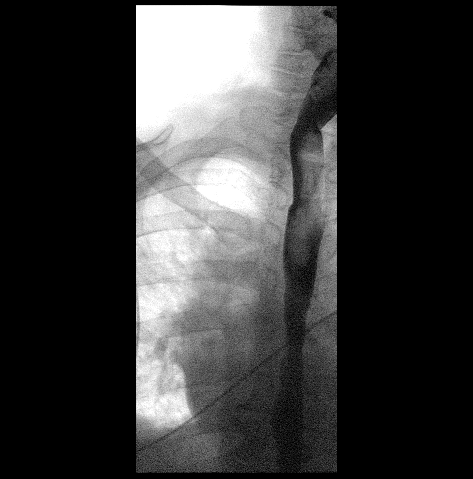
[frame 43/282]
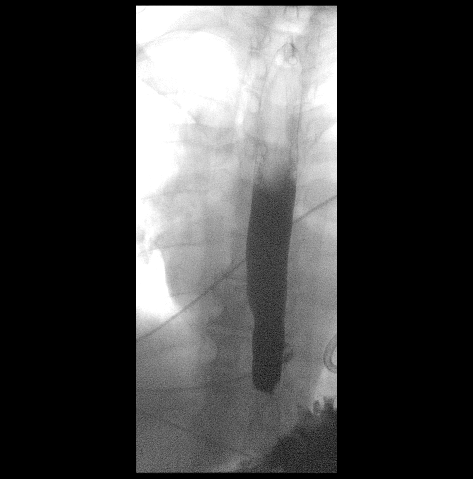
[frame 142/282]
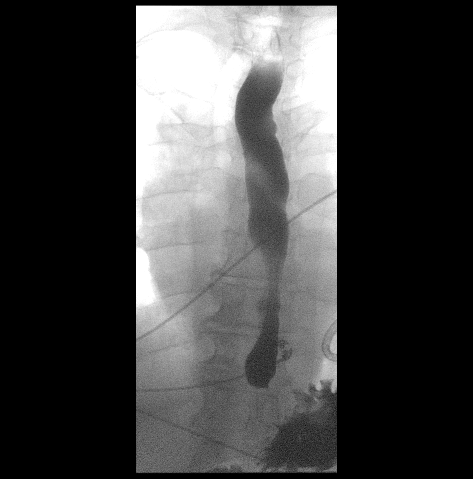
[frame 240/282]
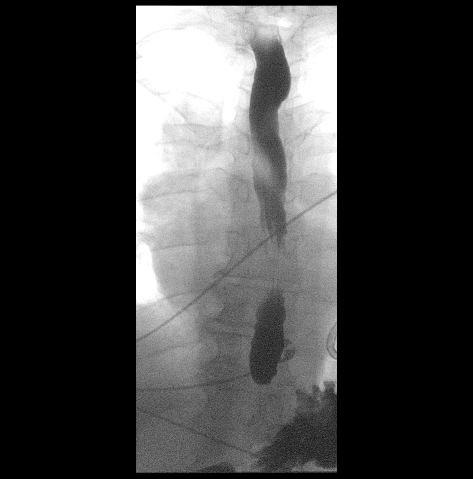

[Series 3: cp_standard · 0.51mm/px · 4 of 449 frames shown (3 of 4)]
[frame 68/449]
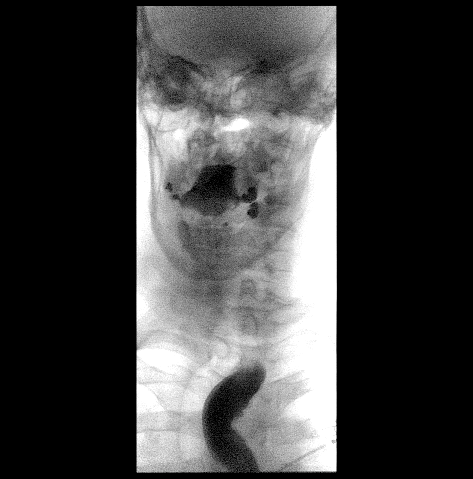
[frame 131/449]
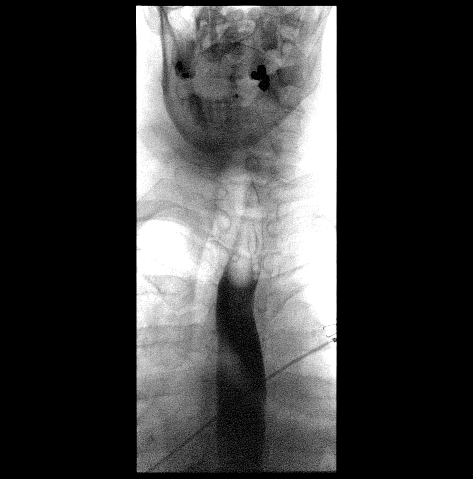
[frame 225/449]
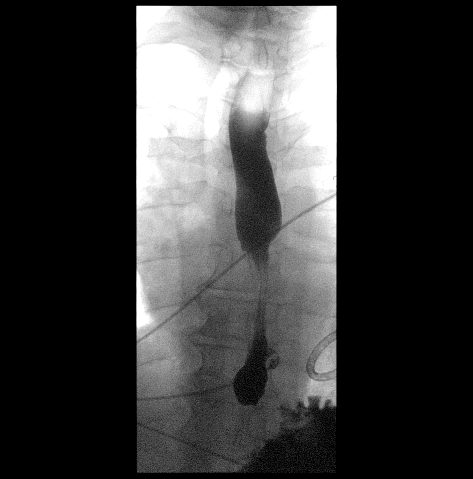
[frame 382/449]
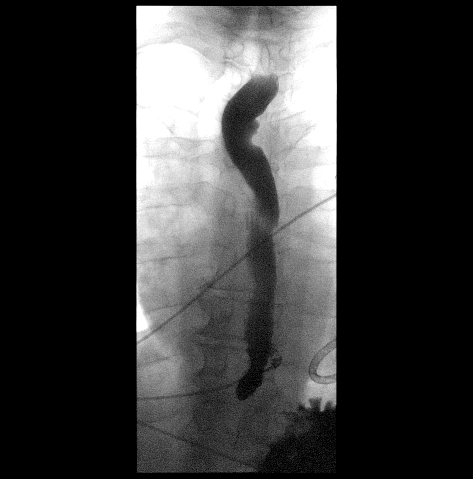

[Series 4: cp_standard · 0.51mm/px · 4 of 448 frames shown (4 of 4)]
[frame 68/448]
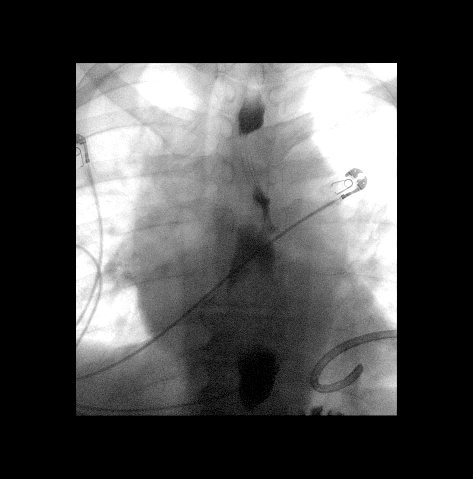
[frame 225/448]
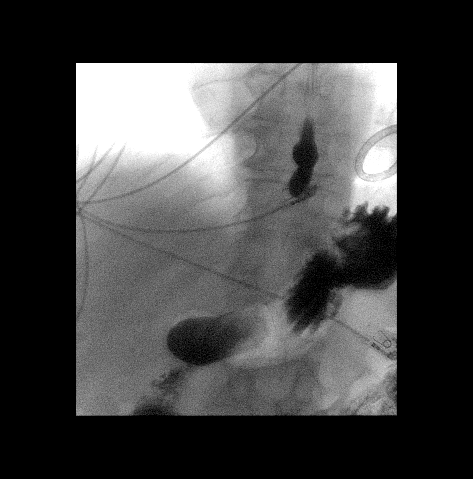
[frame 381/448]
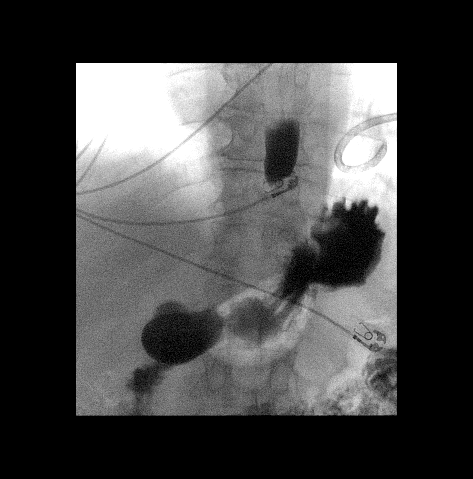
[frame 445/448]
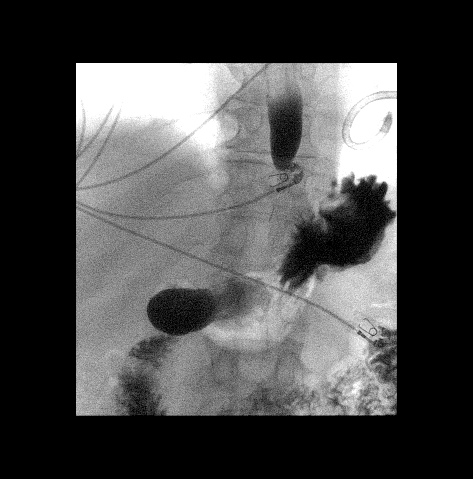

[16 of 16 positions shown; findings below may reference images not displayed]

FINDINGS: No aspiration was seen.

There is moderate-severe esophageal dysmotility. Delayed progression
of contrast through the gastroesophageal junction.

No visible ulceration. The GE junction is below the diaphragm. No
evidence of contrast leak. There is a left-sided pleural drain in
place.
IMPRESSION: No evidence of contrast leak. The GE junction is below the
diaphragm.

## 2023-03-23 NOTE — Telephone Encounter (Signed)
 Copied from CRM [id number]. Topic: Clinical - Red Word Triage >> Mar 23, 2023  1:44 PM Gildardo Pounds wrote: Red Word that prompted transfer to Nurse Triage: asthma patient, congested, feels like someone is standing on his chest. callback number [phone] Reason for Disposition  [1] Longstanding difficulty breathing (e.g., CHF, COPD, emphysema) AND [2] WORSE than normal  Answer Assessment - Initial Assessment Questions 1. RESPIRATORY STATUS: "Describe your breathing?" (e.g., wheezing, shortness of breath, unable to speak, severe coughing)      I have chest pressure from asthma.   Having shortness of breath.   No wheezing.   2. ONSET: "When did this breathing problem begin?"      I'm having a hard time breathing  started yesterday morning. 3. PATTERN "Does the difficult breathing come and go, or has it been constant since it started?"      Constant  4. SEVERITY: "How bad is your breathing?" (e.g., mild, moderate, severe)    - MILD: No SOB at rest, mild SOB with walking, speaks normally in sentences, can lie down, no retractions, pulse < 100.    - MODERATE: SOB at rest, SOB with minimal exertion and prefers to sit, cannot lie down flat, speaks in phrases, mild retractions, audible wheezing, pulse 100-120.    - SEVERE: Very SOB at rest, speaks in single words, struggling to breathe, sitting hunched forward, retractions, pulse > 120      Moderate 5. RECURRENT SYMPTOM: "Have you had difficulty breathing before?" If Yes, ask: "When was the last time?" and "What happened that time?"      Yes asthma 6. CARDIAC HISTORY: "Do you have any history of heart disease?" (e.g., heart attack, angina, bypass surgery, angioplasty)      Not asked 7. LUNG HISTORY: "Do you have any history of lung disease?"  (e.g., pulmonary embolus, asthma, emphysema)     asthma 8. CAUSE: "What do you think is causing the breathing problem?"      asthma 9. OTHER SYMPTOMS: "Do you have any other symptoms? (e.g., dizziness, runny nose,  cough, chest pain, fever)     Just my chest is tight. 10. O2 SATURATION MONITOR:  "Do you use an oxygen saturation monitor (pulse oximeter) at home?" If Yes, ask: "What is your reading (oxygen level) today?" "What is your usual oxygen saturation reading?" (e.g., 95%)       N/A 11. PREGNANCY: "Is there any chance you are pregnant?" "When was your last menstrual period?"       N/A 12. TRAVEL: "Have you traveled out of the country in the last month?" (e.g., travel history, exposures)       N/A  Protocols used: Breathing Difficulty-A-AH  Chief Complaint: Asthma flare up from trees blooming Symptoms: chest tightness and shortness of breath Frequency: Started yesterday (Sunday) Pertinent Negatives: Patient denies wheezing or other symptoms Disposition: [] ED /[] Urgent Care (no appt availability in office) / [x] Appointment(In office/virtual)/ []  [facility]/ [] Home Care/ [] Refused Recommended Disposition /[] [facility]/ []  Follow-up with PCP Additional Notes: Appt made for 03/24/2023 in afternoon at pt's request.

## 2023-03-24 ENCOUNTER — Other Ambulatory Visit: Payer: Self-pay | Admitting: Family Medicine

## 2023-03-24 ENCOUNTER — Other Ambulatory Visit: Payer: Self-pay

## 2023-03-24 ENCOUNTER — Ambulatory Visit: Admitting: Family Medicine

## 2023-03-24 ENCOUNTER — Encounter: Payer: Self-pay | Admitting: Family Medicine

## 2023-03-24 ENCOUNTER — Ambulatory Visit: Payer: BC Managed Care – PPO | Admitting: Gastroenterology

## 2023-03-24 ENCOUNTER — Encounter: Payer: Self-pay | Admitting: Gastroenterology

## 2023-03-24 VITALS — BP 118/68 | HR 60 | Ht 68.0 in | Wt 179.0 lb

## 2023-03-24 VITALS — BP 135/80 | HR 80 | Temp 98.9°F | Ht 68.0 in | Wt 177.0 lb

## 2023-03-24 DIAGNOSIS — J301 Allergic rhinitis due to pollen: Secondary | ICD-10-CM

## 2023-03-24 DIAGNOSIS — Z860100 Personal history of colon polyps, unspecified: Secondary | ICD-10-CM

## 2023-03-24 DIAGNOSIS — Z9889 Other specified postprocedural states: Secondary | ICD-10-CM | POA: Diagnosis not present

## 2023-03-24 DIAGNOSIS — Z7902 Long term (current) use of antithrombotics/antiplatelets: Secondary | ICD-10-CM

## 2023-03-24 DIAGNOSIS — G4709 Other insomnia: Secondary | ICD-10-CM

## 2023-03-24 DIAGNOSIS — K219 Gastro-esophageal reflux disease without esophagitis: Secondary | ICD-10-CM | POA: Diagnosis not present

## 2023-03-24 DIAGNOSIS — J4521 Mild intermittent asthma with (acute) exacerbation: Secondary | ICD-10-CM | POA: Diagnosis not present

## 2023-03-24 IMAGING — DX DG CHEST 2V
2 series · 2 of 2 positions shown · non-contrast
Comparison: 08/28/2020

CLINICAL DATA: LEFT pneumothorax, J

EXAM:
CHEST - 2 VIEW

[chest pa]
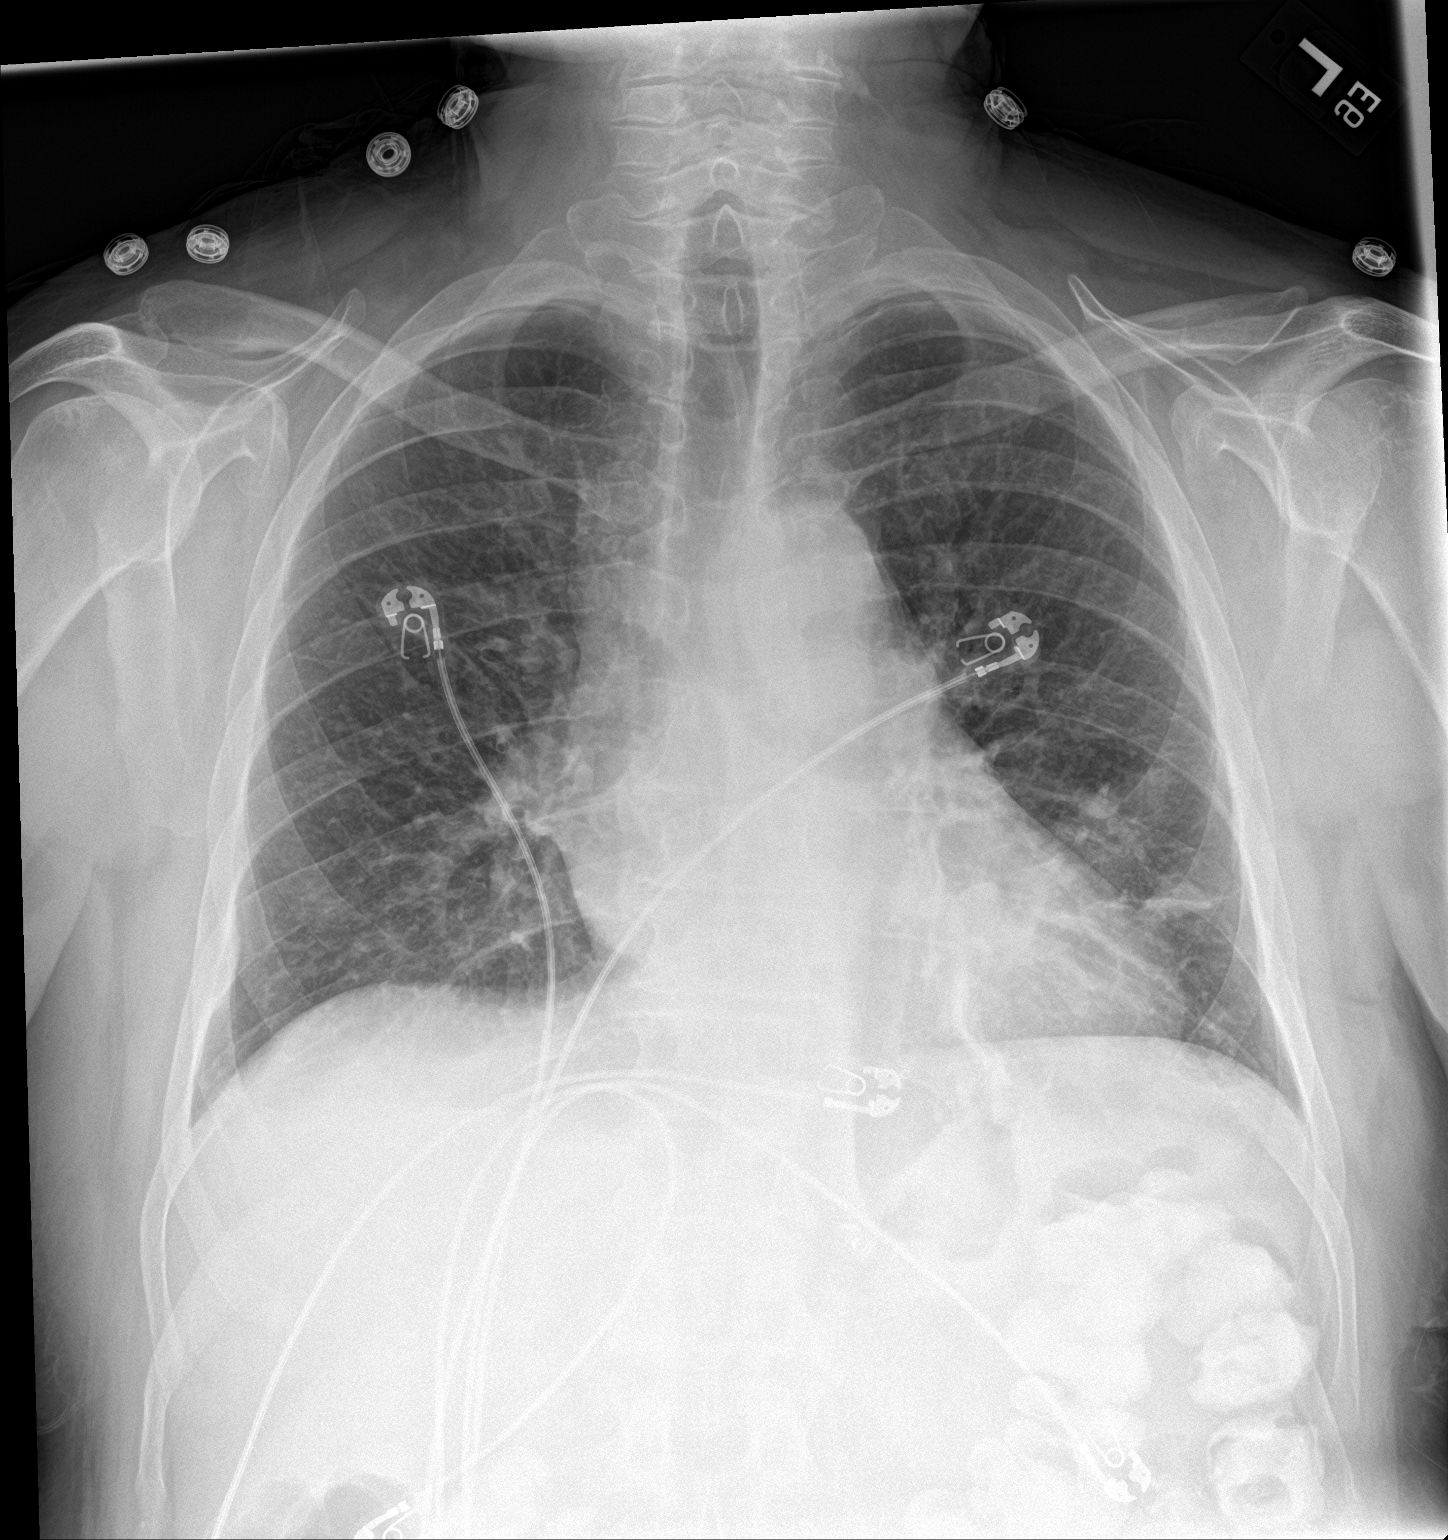

[chest lat]
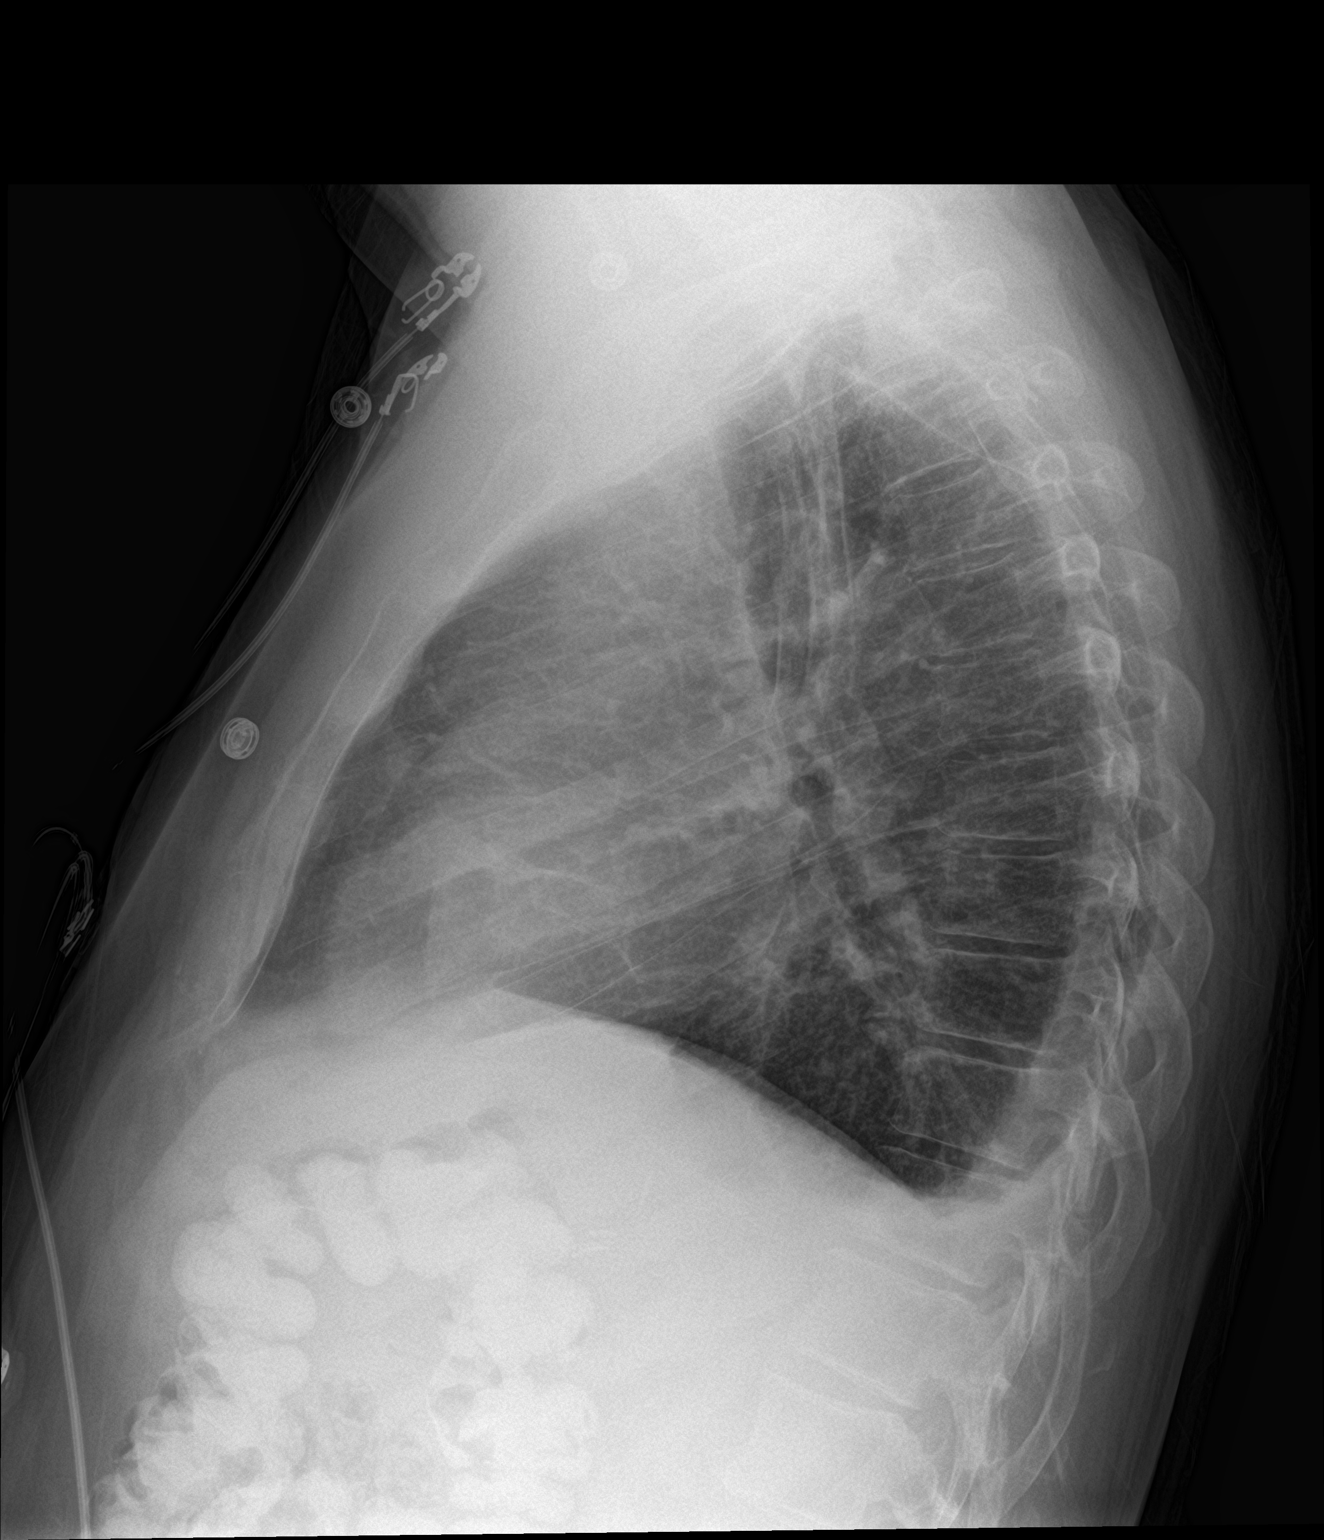

[2 of 2 positions shown; findings below may reference images not displayed]

FINDINGS: Borderline enlargement of cardiac silhouette.

Interval removal of LEFT thoracostomy tube.

Mediastinal contours and pulmonary vascularity normal.

Subsegmental atelectasis LEFT base, minimally at RIGHT base.

Lungs otherwise clear.

No new infiltrate, pleural effusion, or residual pneumothorax.

Bones demineralized.
IMPRESSION: No pneumothorax following LEFT thoracostomy tube removal.

Bibasilar atelectasis greater on LEFT.

## 2023-03-24 MED ORDER — PREDNISONE 20 MG PO TABS: 40.0000 mg | ORAL_TABLET | Freq: Every day | ORAL | 0 refills | Status: AC

## 2023-03-24 MED ORDER — SUFLAVE 178.7 G PO SOLR: 1.0000 | Freq: Once | ORAL | 0 refills | Status: AC

## 2023-03-24 MED ORDER — LEVOCETIRIZINE DIHYDROCHLORIDE 5 MG PO TABS
5.0000 mg | ORAL_TABLET | Freq: Every evening | ORAL | 0 refills | Status: DC
Start: 2023-03-24 — End: 2023-07-27
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
## 2023-03-24 NOTE — Progress Notes (Addendum)
 Subjective:  Patient ID: Johnny Mathews, male    DOB: [birth year], 63 y.o.   MRN: [id number]  Patient Care Team: Dettinger, Elige Radon, MD as PCP - General (Family Medicine) Glendale Chard, DO as Consulting Physician (Neurology)   Chief Complaint:  Asthma  HPI: Johnny Mathews is a 63 y.o. male presenting on 03/24/2023 for Asthma  Asthma He complains of chest tightness, cough, difficulty breathing, frequent throat clearing, hoarse voice and shortness of breath. There is no hemoptysis, sputum production or wheezing. This is a recurrent problem. The current episode started in the past 7 days. The problem occurs constantly. The problem has been gradually worsening. The cough is non-productive. Associated symptoms include chest pain, dyspnea on exertion and headaches. Pertinent negatives include no fever, heartburn, malaise/fatigue, myalgias, nasal congestion, rhinorrhea, sneezing, sore throat, trouble swallowing or weight loss. His symptoms are aggravated by pollen. His symptoms are alleviated by leukotriene antagonist and beta-agonist. He reports minimal improvement on treatment. His symptoms are not alleviated by beta-agonist and leukotriene antagonist (albuterol inhaler BID. nebulizer at night sometimes). His past medical history is significant for asthma.   Relevant past medical, surgical, family, and social history reviewed and updated as indicated.  Allergies and medications reviewed and updated. Data reviewed: Chart in Epic.   Past Medical History:  Diagnosis Date   Allergy    Alpha galactosidase deficiency    Anemia    past hx    Arthritis    Asthma    Blood transfusion without reported diagnosis    BPH (benign prostatic hypertrophy)    Cataract    removed left eye    Complication of anesthesia    Diabetes mellitus without complication (HCC)    Dysrhythmia    GERD (gastroesophageal reflux disease)    Heart murmur    History of kidney stones    HOH (hard of hearing)     Hyperlipidemia    Hypertension    Irregular heart beat    Muscular dystrophy (HCC)    PONV (postoperative nausea and vomiting)    Sleep apnea    Wears hearing aid in both ears     Past Surgical History:  Procedure Laterality Date   CATARACT EXTRACTION Left    COLONOSCOPY     ESOPHAGOGASTRODUODENOSCOPY N/A 08/27/2020   Procedure: ESOPHAGOGASTRODUODENOSCOPY (EGD);  Surgeon: Corliss Skains, MD;  Location: [facility] OR;  Service: Thoracic;  Laterality: N/A;   FEMUR FRACTURE SURGERY     INGUINAL HERNIA REPAIR Right 08/07/2021   Procedure: HERNIA REPAIR INGUINAL ADULT WITH MESH;  Surgeon: Lucretia Roers, MD;  Location: [facility];  Service: General;  Laterality: Right;   INSERTION OF MESH N/A 08/27/2020   Procedure: INSERTION OF ACELL 7.5 x 6cm GENTRIX SURGICAL MATRIX HIATAL MESH;  Surgeon: Corliss Skains, MD;  Location: [facility] OR;  Service: Thoracic;  Laterality: N/A;   KIDNEY STONE SURGERY     x6   KNEE SURGERY Right    x2   MASS EXCISION Right 08/02/2018   Procedure: EXCISION RIGHT LONG FINGER MASS, DEBRIDEMENT PROXIMAL INTERPHALANGEAL JOINT WITH ROTATION FLAP;  Surgeon: Betha Loa, MD;  Location: [facility];  Service: Orthopedics;  Laterality: Right;   NISSEN FUNDOPLICATION     [facility]    SHOULDER OPEN ROTATOR CUFF REPAIR     XI ROBOTIC ASSISTED HIATAL HERNIA REPAIR N/A 08/27/2020   Procedure: XI ROBOTIC ASSISTED REDO HIATAL HERNIA REPAIR;  Surgeon: Corliss Skains, MD;  Location: [facility] OR;  Service:  Thoracic;  Laterality: N/A;    Social History   Socioeconomic History   Marital status: Married    Spouse name: Not on file   Number of children: Not on file   Years of education: Not on file   Highest education level: Not on file  Occupational History   Occupation: Designer, industrial/product  Tobacco Use   Smoking status: Former    Current packs/day: 0.00    Average packs/day: 1 pack/day for 10.0 years (10.0 ttl pk-yrs)    Types: Cigarettes    Start date: 01/07/1984     Quit date: 01/06/1994    Years since quitting: 29.2   Smokeless tobacco: Current    Types: Snuff  Vaping Use   Vaping status: Never Used  Substance and Sexual Activity   Alcohol use: Yes    Alcohol/week: 2.0 - 3.0 standard drinks of alcohol    Types: 2 - 3 Standard drinks or equivalent per week    Comment: social   Drug use: No   Sexual activity: Not on file  Other Topics Concern   Not on file  Social History Narrative   Not on file   Social Drivers of Health   Financial Resource Strain: Not on file  Food Insecurity: Not on file  Transportation Needs: Not on file  Physical Activity: Not on file  Stress: Not on file  Social Connections: Not on file  Intimate Partner Violence: Not on file    Outpatient Encounter Medications as of 03/24/2023  Medication Sig   albuterol (PROVENTIL) (2.5 MG/3ML) 0.083% nebulizer solution NEBULIZE 1 VIAL EVERY 6 HOURS AS NEEDED FOR WHEEZING & SHORTNESS OF BREATH   albuterol (VENTOLIN HFA) 108 (90 Base) MCG/ACT inhaler INHALE 2 PUFFS EVERY 4 HOURS AS NEEDED   amitriptyline (ELAVIL) 100 MG tablet Take 2 tablets (200 mg total) by mouth at bedtime. **NEEDS TO BE SEEN BEFORE NEXT REFILL**   azelastine (ASTELIN) 0.1 % nasal spray USE 1 SPRAY IN EACH NOSTRIL TWICE DAILY AS NEEDED   blood glucose meter kit and supplies Dispense based on patient and insurance preference. Use up to four times daily as directed. (FOR ICD-10 E10.9, E11.9). Pt states needs One Touch Verio Meter and one touch delica plus test strips   Blood Glucose Monitoring Suppl (CONTOUR BLOOD GLUCOSE SYSTEM) w/Device KIT Test blood sugars four times daily   BREZTRI AEROSPHERE 160-9-4.8 MCG/ACT AERO USE 2 PUFFS EVERY MORNING AND AT BEDTIME   cyclobenzaprine (FLEXERIL) 10 MG tablet Take 1 tablet (10 mg total) by mouth 3 (three) times daily as needed for muscle spasms. Take 1 tablet by mouth 3 times daily as needed for muscle spasm. Warning: May cause drowsiness.   EPINEPHRINE 0.3 mg/0.3 mL IJ SOAJ  injection INJECT 0.3ML (0.3MG ) IM ONCE   famotidine (PEPCID) 20 MG tablet Take 1 tablet (20 mg total) by mouth 2 (two) times daily.   ferrous sulfate 325 (65 FE) MG tablet Take 325 mg by mouth daily with breakfast.   finasteride (PROSCAR) 5 MG tablet TAKE ONE (1) TABLET EACH DAY   fluticasone (FLONASE) 50 MCG/ACT nasal spray USE 1 TO 2 SPRAYS IN EACH NOSTRIL DAILY   furosemide (LASIX) 20 MG tablet Take 1 tablet (20 mg total) by mouth daily as needed.   glucose blood (CONTOUR NEXT TEST) test strip Test blood sugars four times daily   Lancets (ONETOUCH DELICA PLUS LANCET33G) MISC    losartan (COZAAR) 50 MG tablet Take 1 tablet (50 mg total) by mouth daily.   meloxicam (  MOBIC) 15 MG tablet Take 1 tablet (15 mg total) by mouth daily.   metoprolol succinate (TOPROL-XL) 25 MG 24 hr tablet Take 25 mg by mouth 2 (two) times daily.   montelukast (SINGULAIR) 10 MG tablet TAKE ONE TABLET DAILY AT BEDTIME   OZEMPIC, 1 MG/DOSE, 4 MG/3ML SOPN INJECT 1MG  AS DIRECTED ONCE A WEEK   pravastatin (PRAVACHOL) 80 MG tablet Take 1 tablet (80 mg total) by mouth daily.   Spacer/Aero-Holding Rudean Curt Use with inhaler daily   SUFLAVE 178.7 g SOLR Take 1 kit by mouth once for 1 dose.   SYRINGE-NEEDLE, DISP, 3 ML 21G X 1-1/2" 3 ML MISC Use to inject testosterone every week   tadalafil (CIALIS) 10 MG tablet TAKE 1 TABLET BY MOUTH EVERY OTHER DAY AS NEEDED   tamsulosin (FLOMAX) 0.4 MG CAPS capsule TAKE 1 CAPSULE DAILY AFTER SUPPER   testosterone cypionate (DEPOTESTOSTERONE CYPIONATE) 200 MG/ML injection INJECT 100MG  (0.5ML) EVERY 10 DAYS (FOR INTRAMUSCULAR USE ONLY)   traZODone (DESYREL) 50 MG tablet Take 0.5-1 tablets (25-50 mg total) by mouth at bedtime as needed for sleep.   ipratropium (ATROVENT) 0.06 % nasal spray 2 sprays into each nostril Three (3) times a day.   No facility-administered encounter medications on file as of 03/24/2023.    Allergies  Allergen Reactions   Egg-Derived Products Swelling     Throat- 03-31-2019 pt states can eat cakes and Pie with no issues  Throat- 03-31-2019 pt states can eat cakes and Pie with no issues    Septra [Sulfamethoxazole-Trimethoprim] Swelling    throat   Sulfonamide Derivatives Hives   Latex Rash    Review of Systems  Constitutional:  Negative for fever, malaise/fatigue and weight loss.  HENT:  Positive for hoarse voice. Negative for rhinorrhea, sneezing, sore throat and trouble swallowing.   Respiratory:  Positive for cough and shortness of breath. Negative for hemoptysis, sputum production and wheezing.   Cardiovascular:  Positive for chest pain and dyspnea on exertion.  Gastrointestinal:  Negative for heartburn.  Musculoskeletal:  Negative for myalgias.  Neurological:  Positive for headaches.   Objective:  BP 135/80   Pulse 80   Temp 98.9 F (37.2 C)   Ht 5\' 8"  (1.727 m)   Wt 177 lb (80.3 kg)   SpO2 97%   BMI 26.91 kg/m    Wt Readings from Last 3 Encounters:  03/24/23 177 lb (80.3 kg)  03/24/23 179 lb (81.2 kg)  03/10/23 179 lb 3.2 oz (81.3 kg)   Physical Exam Constitutional:      General: He is awake. He is not in acute distress.    Appearance: Normal appearance. He is well-developed and well-groomed. He is not ill-appearing, toxic-appearing or diaphoretic.  Cardiovascular:     Rate and Rhythm: Normal rate and regular rhythm.     Pulses: Normal pulses.          Radial pulses are 2+ on the right side and 2+ on the left side.       Posterior tibial pulses are 2+ on the right side and 2+ on the left side.     Heart sounds: Murmur heard.     Systolic murmur is present with a grade of 5/6.     No gallop.  Pulmonary:     Effort: Pulmonary effort is normal. No respiratory distress.     Breath sounds: Normal breath sounds. No stridor, decreased air movement or transmitted upper airway sounds. No decreased breath sounds, wheezing, rhonchi or rales.  Musculoskeletal:  Cervical back: Full passive range of motion without pain and  neck supple.     Right lower leg: No edema.     Left lower leg: No edema.  Skin:    General: Skin is warm.     Capillary Refill: Capillary refill takes less than 2 seconds.  Neurological:     General: No focal deficit present.     Mental Status: He is alert, oriented to person, place, and time and easily aroused. Mental status is at baseline.     GCS: GCS eye subscore is 4. GCS verbal subscore is 5. GCS motor subscore is 6.     Motor: No weakness.  Psychiatric:        Attention and Perception: Attention and perception normal.        Mood and Affect: Mood and affect normal.        Speech: Speech normal.        Behavior: Behavior normal. Behavior is cooperative.        Thought Content: Thought content normal. Thought content does not include homicidal or suicidal ideation. Thought content does not include homicidal or suicidal plan.        Cognition and Memory: Cognition and memory normal.        Judgment: Judgment normal.     Results for orders placed or performed in visit on 03/05/23  CBC with Differential/Platelet   Collection Time: 03/05/23 11:46 AM  Result Value Ref Range   WBC 5.7 3.4 - 10.8 x10E3/uL   RBC 4.17 4.14 - 5.80 x10E6/uL   Hemoglobin 13.2 13.0 - 17.7 g/dL   Hematocrit 16.1 09.6 - 51.0 %   MCV 95 79 - 97 fL   MCH 31.7 26.6 - 33.0 pg   MCHC 33.2 31.5 - 35.7 g/dL   RDW 04.5 40.9 - 81.1 %   Platelets 247 150 - 450 x10E3/uL   Neutrophils 61 Not Estab. %   Lymphs 24 Not Estab. %   Monocytes 9 Not Estab. %   Eos 4 Not Estab. %   Basos 1 Not Estab. %   Neutrophils Absolute 3.5 1.4 - 7.0 x10E3/uL   Lymphocytes Absolute 1.4 0.7 - 3.1 x10E3/uL   Monocytes Absolute 0.5 0.1 - 0.9 x10E3/uL   EOS (ABSOLUTE) 0.2 0.0 - 0.4 x10E3/uL   Basophils Absolute 0.0 0.0 - 0.2 x10E3/uL   Immature Granulocytes 1 Not Estab. %   Immature Grans (Abs) 0.0 0.0 - 0.1 x10E3/uL  CMP14+EGFR   Collection Time: 03/05/23 11:46 AM  Result Value Ref Range   Glucose 71 70 - 99 mg/dL   BUN 18 8  - 27 mg/dL   Creatinine, Ser 9.14 0.76 - 1.27 mg/dL   eGFR 65 >78 GN/FAO/1.30   BUN/Creatinine Ratio 15 10 - 24   Sodium 141 134 - 144 mmol/L   Potassium 4.8 3.5 - 5.2 mmol/L   Chloride 105 96 - 106 mmol/L   CO2 22 20 - 29 mmol/L   Calcium 9.8 8.6 - 10.2 mg/dL   Total Protein 6.7 6.0 - 8.5 g/dL   Albumin 4.3 3.9 - 4.9 g/dL   Globulin, Total 2.4 1.5 - 4.5 g/dL   Bilirubin Total 0.3 0.0 - 1.2 mg/dL   Alkaline Phosphatase 154 (H) 44 - 121 IU/L   AST 31 0 - 40 IU/L   ALT 26 0 - 44 IU/L  Lipid panel   Collection Time: 03/05/23 11:46 AM  Result Value Ref Range   Cholesterol, Total 164 100 - 199 mg/dL  Triglycerides 71 0 - 149 mg/dL   HDL 73 >96 mg/dL   VLDL Cholesterol Cal 14 5 - 40 mg/dL   LDL Chol Calc (NIH) 77 0 - 99 mg/dL   Chol/HDL Ratio 2.2 0.0 - 5.0 ratio  Hgb A1c w/o eAG   Collection Time: 03/05/23 11:46 AM  Result Value Ref Range   Hgb A1c MFr Bld 4.8 4.8 - 5.6 %       03/24/2023    3:50 PM 03/05/2023   11:50 AM 02/04/2023    4:12 PM 12/11/2022    9:12 AM 08/06/2022    1:24 PM  Depression screen PHQ 2/9  Decreased Interest 0 0 0 0 0  Down, Depressed, Hopeless 0 0 0 0 0  PHQ - 2 Score 0 0 0 0 0  Altered sleeping    0 0  Tired, decreased energy    0 0  Change in appetite    0 0  Feeling bad or failure about yourself     0 0  Trouble concentrating    0 0  Moving slowly or fidgety/restless    0 0  Suicidal thoughts    0 0  PHQ-9 Score    0 0  Difficult doing work/chores    Not difficult at all Not difficult at all       03/24/2023    3:50 PM 12/11/2022    9:12 AM 08/06/2022    1:25 PM 07/25/2022   10:23 AM  GAD 7 : Generalized Anxiety Score  Nervous, Anxious, on Edge 0 0 0 0  Control/stop worrying 0 0 0 0  Worry too much - different things 0 0 0 0  Trouble relaxing 0 0 0 0  Restless 0 0 0 0  Easily annoyed or irritable 0 0 0 0  Afraid - awful might happen 0 0 0 0  Total GAD 7 Score 0 0 0 0  Anxiety Difficulty Not difficult at all Not difficult at all Not  difficult at all Not difficult at all   Pertinent labs & imaging results that were available during my care of the patient were reviewed by me and considered in my medical decision making.  Assessment & Plan:  Johnny Mathews was seen today for asthma.  Diagnoses and all orders for this visit:  1. Mild intermittent asthma with acute exacerbation (Primary) Will start medication as below. Discussed red flag symptoms with patient. Encouraged patient to utilize nebulizer in these acute exacerbations.  - predniSONE (DELTASONE) 20 MG tablet; Take 2 tablets (40 mg total) by mouth daily with breakfast for 5 days.  Dispense: 10 tablet; Refill: 0  2. Seasonal allergic rhinitis due to pollen Patient triggered by seasonal allergies. Has been taking zyrtec for >3 months. Recommend cycling to xyzal as below. Encouraged patient to continue saline nasal spray followed by flonase. In addition, continue singulair and pepcid.  - levocetirizine (XYZAL) 5 MG tablet; Take 1 tablet (5 mg total) by mouth every evening.  Dispense: 90 tablet; Refill: 0  Continue all other maintenance medications.  Follow up plan: Return if symptoms worsen or fail to improve.   Continue healthy lifestyle choices, including diet (rich in fruits, vegetables, and lean proteins, and low in salt and simple carbohydrates) and exercise (at least 30 minutes of moderate physical activity daily).  Written and verbal instructions provided   The above assessment and management plan was discussed with the patient. The patient verbalized understanding of and has agreed to the management plan. Patient is  aware to call the clinic if they develop any new symptoms or if symptoms persist or worsen. Patient is aware when to return to the clinic for a follow-up visit. Patient educated on when it is appropriate to go to the emergency department.   Neale Burly, DNP-FNP [facility] [address] [place], [place] [address] [phone]

## 2023-03-24 NOTE — Patient Instructions (Signed)
 Stop Zyrtec. Start Xyzal

## 2023-03-24 NOTE — Patient Instructions (Addendum)
 You have been scheduled for a colonoscopy. Please follow written instructions given to you at your visit today.   If you use inhalers (even only as needed), please bring them with you on the day of your procedure.  DO NOT TAKE 7 DAYS PRIOR TO TEST- Trulicity (dulaglutide) Ozempic, Wegovy (semaglutide) Mounjaro (tirzepatide) Bydureon Bcise (exanatide extended release)  DO NOT TAKE 1 DAY PRIOR TO YOUR TEST Rybelsus (semaglutide) Adlyxin (lixisenatide) Victoza (liraglutide) Byetta (exanatide) ___________________________________________________________________________  Bonita Quin will receive your bowel preparation through [facility], which ensures the lowest copay and home delivery, with outreach via text or call from an [phone] number. Please respond promptly to avoid rescheduling of your procedure. If you are interested in alternative options or have any questions regarding your prep, please contact them at [phone] ____________________________________________________________________________  Your Provider Has Sent Your Bowel Prep Regimen To [facility]   [facility] will contact you to verify your information and collect your copay, if applicable. Enjoy the comfort of your home while your prescription is mailed to you, FREE of any shipping charges.   [facility] accepts all major insurance benefits and applies discounts & coupons.  Have additional questions?   Chat: [email or web address] Call: [phone] Email: [email or web address] NCPDP: [id number]  How will [facility] contact you?  With a Welcome phone call,  a Welcome text and a checkout link in text form.  Texts you receive from [phone] Are NOT Spam.  *To set up delivery, you must complete the checkout process via link or speak to one of the patient care representatives. If [facility] is unable to reach you, your prescription may be delayed.  To avoid long hold times on the phone, you may also utilize the secure chat  feature on the [facility] website to request that they call you back for transaction completion or to expedite your concerns.   _______________________________________________________  If your blood pressure at your visit was 140/90 or greater, please contact your primary care physician to follow up on this.  _______________________________________________________  If you are age 29 or older, your body mass index should be between 23-30. Your Body mass index is 27.22 kg/m. If this is out of the aforementioned range listed, please consider follow up with your Primary Care Provider.  If you are age 27 or younger, your body mass index should be between 19-25. Your Body mass index is 27.22 kg/m. If this is out of the aformentioned range listed, please consider follow up with your Primary Care Provider.   ________________________________________________________  The [facility] providers would like to encourage you to use [facility] to communicate with providers for non-urgent requests or questions.  Due to long hold times on the telephone, sending your provider a message by [facility] may be a faster and more efficient way to get a response.  Please allow 48 business hours for a response.  Please remember that this is for non-urgent requests.  _______________________________________________________ It was a pleasure to see you today!  Thank you for trusting me with your gastrointestinal care!

## 2023-03-27 ENCOUNTER — Encounter: Payer: Self-pay | Admitting: Family Medicine

## 2023-03-31 ENCOUNTER — Telehealth: Payer: Self-pay

## 2023-03-31 NOTE — Telephone Encounter (Signed)
 Called and left voicemail for patient to give me a call back for eliquis clearance.

## 2023-04-03 NOTE — Telephone Encounter (Signed)
 Patient is returning the call

## 2023-04-06 ENCOUNTER — Encounter: Payer: Self-pay | Admitting: *Deleted

## 2023-04-06 ENCOUNTER — Encounter: Payer: Self-pay | Admitting: Family Medicine

## 2023-04-06 NOTE — Telephone Encounter (Signed)
 Tried returning call to patient. Phone rings busy. Called and left message on spouse Albin Felling) cell phone.

## 2023-04-07 ENCOUNTER — Encounter: Payer: Self-pay | Admitting: Family Medicine

## 2023-04-07 ENCOUNTER — Ambulatory Visit: Admitting: Family Medicine

## 2023-04-07 ENCOUNTER — Ambulatory Visit (INDEPENDENT_AMBULATORY_CARE_PROVIDER_SITE_OTHER)

## 2023-04-07 VITALS — BP 134/76 | HR 74 | Temp 98.9°F | Ht 68.0 in | Wt 181.0 lb

## 2023-04-07 DIAGNOSIS — R0602 Shortness of breath: Secondary | ICD-10-CM

## 2023-04-07 DIAGNOSIS — I25118 Atherosclerotic heart disease of native coronary artery with other forms of angina pectoris: Secondary | ICD-10-CM | POA: Diagnosis not present

## 2023-04-07 DIAGNOSIS — I350 Nonrheumatic aortic (valve) stenosis: Secondary | ICD-10-CM | POA: Diagnosis not present

## 2023-04-07 DIAGNOSIS — I5032 Chronic diastolic (congestive) heart failure: Secondary | ICD-10-CM

## 2023-04-07 DIAGNOSIS — J189 Pneumonia, unspecified organism: Secondary | ICD-10-CM

## 2023-04-07 MED ORDER — AMOXICILLIN-POT CLAVULANATE 875-125 MG PO TABS: 1.0000 | ORAL_TABLET | Freq: Two times a day (BID) | ORAL | 0 refills | Status: AC

## 2023-04-07 MED ORDER — AZITHROMYCIN 250 MG PO TABS: ORAL_TABLET | ORAL | 0 refills | Status: AC

## 2023-04-07 NOTE — Progress Notes (Signed)
 Subjective:  Patient ID: Johnny Mathews, male    DOB: [birth year], 63 y.o.   MRN: [id number]  Patient Care Team: Dettinger, Elige Radon, MD as PCP - General (Family Medicine) Glendale Chard, DO as Consulting Physician (Neurology)   Chief Complaint:  asthma (Headaches/allergies)  HPI: Johnny Mathews is a 63 y.o. male presenting on 04/07/2023 for asthma (Headaches/allergies) States that he is continuing to have shortness of breath. Worse at night and in the mornings. Reports that he is coughing more at night. He is coughing up phlegm that is gray/white. He was prescribed prednisone 03/24/23, states that he completed course. States that he is using nebulizer twice at night. He is using albuterol inhaler 2-3 times per day. He is using breztri two inhalations at bedtime. He is established with [facility].   Relevant past medical, surgical, family, and social history reviewed and updated as indicated.  Allergies and medications reviewed and updated. Data reviewed: Chart in Epic.   Past Medical History:  Diagnosis Date   Allergy    Alpha galactosidase deficiency    Anemia    past hx    Arthritis    Asthma    Blood transfusion without reported diagnosis    BPH (benign prostatic hypertrophy)    Cataract    removed left eye    Complication of anesthesia    Diabetes mellitus without complication (HCC)    Dysrhythmia    GERD (gastroesophageal reflux disease)    Heart murmur    History of kidney stones    HOH (hard of hearing)    Hyperlipidemia    Hypertension    Irregular heart beat    Muscular dystrophy (HCC)    PONV (postoperative nausea and vomiting)    Sleep apnea    Wears hearing aid in both ears     Past Surgical History:  Procedure Laterality Date   CATARACT EXTRACTION Left    COLONOSCOPY     ESOPHAGOGASTRODUODENOSCOPY N/A 08/27/2020   Procedure: ESOPHAGOGASTRODUODENOSCOPY (EGD);  Surgeon: Corliss Skains, MD;  Location: [facility] OR;  Service: Thoracic;  Laterality:  N/A;   FEMUR FRACTURE SURGERY     INGUINAL HERNIA REPAIR Right 08/07/2021   Procedure: HERNIA REPAIR INGUINAL ADULT WITH MESH;  Surgeon: Lucretia Roers, MD;  Location: [facility];  Service: General;  Laterality: Right;   INSERTION OF MESH N/A 08/27/2020   Procedure: INSERTION OF ACELL 7.5 x 6cm GENTRIX SURGICAL MATRIX HIATAL MESH;  Surgeon: Corliss Skains, MD;  Location: [facility] OR;  Service: Thoracic;  Laterality: N/A;   KIDNEY STONE SURGERY     x6   KNEE SURGERY Right    x2   MASS EXCISION Right 08/02/2018   Procedure: EXCISION RIGHT LONG FINGER MASS, DEBRIDEMENT PROXIMAL INTERPHALANGEAL JOINT WITH ROTATION FLAP;  Surgeon: Betha Loa, MD;  Location: [facility];  Service: Orthopedics;  Laterality: Right;   NISSEN FUNDOPLICATION     [facility]    SHOULDER OPEN ROTATOR CUFF REPAIR     XI ROBOTIC ASSISTED HIATAL HERNIA REPAIR N/A 08/27/2020   Procedure: XI ROBOTIC ASSISTED REDO HIATAL HERNIA REPAIR;  Surgeon: Corliss Skains, MD;  Location: [facility] OR;  Service: Thoracic;  Laterality: N/A;    Social History   Socioeconomic History   Marital status: Married    Spouse name: Not on file   Number of children: Not on file   Years of education: Not on file   Highest education level: Not on file  Occupational History  Occupation: Designer, industrial/product  Tobacco Use   Smoking status: Former    Current packs/day: 0.00    Average packs/day: 1 pack/day for 10.0 years (10.0 ttl pk-yrs)    Types: Cigarettes    Start date: 01/07/1984    Quit date: 01/06/1994    Years since quitting: 29.2   Smokeless tobacco: Current    Types: Snuff  Vaping Use   Vaping status: Never Used  Substance and Sexual Activity   Alcohol use: Yes    Alcohol/week: 2.0 - 3.0 standard drinks of alcohol    Types: 2 - 3 Standard drinks or equivalent per week    Comment: social   Drug use: No   Sexual activity: Not on file  Other Topics Concern   Not on file  Social History Narrative   Not on file   Social  Drivers of Health   Financial Resource Strain: Not on file  Food Insecurity: Not on file  Transportation Needs: Not on file  Physical Activity: Not on file  Stress: Not on file  Social Connections: Not on file  Intimate Partner Violence: Not on file    Outpatient Encounter Medications as of 04/07/2023  Medication Sig   albuterol (PROVENTIL) (2.5 MG/3ML) 0.083% nebulizer solution NEBULIZE 1 VIAL EVERY 6 HOURS AS NEEDED FOR WHEEZING & SHORTNESS OF BREATH   albuterol (VENTOLIN HFA) 108 (90 Base) MCG/ACT inhaler INHALE 2 PUFFS EVERY 4 HOURS AS NEEDED   amitriptyline (ELAVIL) 100 MG tablet TAKE 2 TABLETS BY MOUTH AT BEDTIME   azelastine (ASTELIN) 0.1 % nasal spray USE 1 SPRAY IN EACH NOSTRIL TWICE DAILY AS NEEDED   blood glucose meter kit and supplies Dispense based on patient and insurance preference. Use up to four times daily as directed. (FOR ICD-10 E10.9, E11.9). Pt states needs One Touch Verio Meter and one touch delica plus test strips   Blood Glucose Monitoring Suppl (CONTOUR BLOOD GLUCOSE SYSTEM) w/Device KIT Test blood sugars four times daily   BREZTRI AEROSPHERE 160-9-4.8 MCG/ACT AERO USE 2 PUFFS EVERY MORNING AND AT BEDTIME   cyclobenzaprine (FLEXERIL) 10 MG tablet Take 1 tablet (10 mg total) by mouth 3 (three) times daily as needed for muscle spasms. Take 1 tablet by mouth 3 times daily as needed for muscle spasm. Warning: May cause drowsiness.   EPINEPHRINE 0.3 mg/0.3 mL IJ SOAJ injection INJECT 0.3ML (0.3MG ) IM ONCE   famotidine (PEPCID) 20 MG tablet Take 1 tablet (20 mg total) by mouth 2 (two) times daily.   ferrous sulfate 325 (65 FE) MG tablet Take 325 mg by mouth daily with breakfast.   finasteride (PROSCAR) 5 MG tablet TAKE ONE (1) TABLET EACH DAY   fluticasone (FLONASE) 50 MCG/ACT nasal spray USE 1 TO 2 SPRAYS IN EACH NOSTRIL DAILY   furosemide (LASIX) 20 MG tablet Take 1 tablet (20 mg total) by mouth daily as needed.   glucose blood (CONTOUR NEXT TEST) test strip Test blood  sugars four times daily   Lancets (ONETOUCH DELICA PLUS LANCET33G) MISC    levocetirizine (XYZAL) 5 MG tablet Take 1 tablet (5 mg total) by mouth every evening.   losartan (COZAAR) 50 MG tablet Take 1 tablet (50 mg total) by mouth daily.   meloxicam (MOBIC) 15 MG tablet Take 1 tablet (15 mg total) by mouth daily.   metoprolol succinate (TOPROL-XL) 25 MG 24 hr tablet Take 25 mg by mouth 2 (two) times daily.   montelukast (SINGULAIR) 10 MG tablet TAKE ONE TABLET DAILY AT BEDTIME   OZEMPIC,  1 MG/DOSE, 4 MG/3ML SOPN INJECT 1MG  AS DIRECTED ONCE A WEEK   pravastatin (PRAVACHOL) 80 MG tablet Take 1 tablet (80 mg total) by mouth daily.   Spacer/Aero-Holding Rudean Curt Use with inhaler daily   SYRINGE-NEEDLE, DISP, 3 ML 21G X 1-1/2" 3 ML MISC Use to inject testosterone every week   tadalafil (CIALIS) 10 MG tablet TAKE 1 TABLET BY MOUTH EVERY OTHER DAY AS NEEDED   tamsulosin (FLOMAX) 0.4 MG CAPS capsule TAKE 1 CAPSULE DAILY AFTER SUPPER   testosterone cypionate (DEPOTESTOSTERONE CYPIONATE) 200 MG/ML injection INJECT 100MG  (0.5ML) EVERY 10 DAYS (FOR INTRAMUSCULAR USE ONLY)   traZODone (DESYREL) 50 MG tablet Take 0.5-1 tablets (25-50 mg total) by mouth at bedtime as needed for sleep.   ipratropium (ATROVENT) 0.06 % nasal spray 2 sprays into each nostril Three (3) times a day.   No facility-administered encounter medications on file as of 04/07/2023.    Allergies  Allergen Reactions   Egg-Derived Products Swelling    Throat- 03-31-2019 pt states can eat cakes and Pie with no issues  Throat- 03-31-2019 pt states can eat cakes and Pie with no issues    Septra [Sulfamethoxazole-Trimethoprim] Swelling    throat   Sulfonamide Derivatives Hives   Latex Rash    Review of Systems As per HPI  Objective:  BP 134/76   Pulse 74   Temp 98.9 F (37.2 C)   Ht 5\' 8"  (1.727 m)   Wt 181 lb (82.1 kg)   SpO2 97%   BMI 27.52 kg/m    Wt Readings from Last 3 Encounters:  04/07/23 181 lb (82.1 kg)  03/24/23  177 lb (80.3 kg)  03/24/23 179 lb (81.2 kg)    Physical Exam Constitutional:      General: He is awake. He is not in acute distress.    Appearance: Normal appearance. He is well-developed and well-groomed. He is not ill-appearing, toxic-appearing or diaphoretic.  Cardiovascular:     Rate and Rhythm: Normal rate and regular rhythm.     Heart sounds: Murmur heard.     Systolic murmur is present with a grade of 5/6.  Pulmonary:     Effort: Pulmonary effort is normal.     Breath sounds: Normal breath sounds. No stridor, decreased air movement or transmitted upper airway sounds. No decreased breath sounds, wheezing, rhonchi or rales.  Skin:    General: Skin is warm.     Capillary Refill: Capillary refill takes less than 2 seconds.  Neurological:     General: No focal deficit present.     Mental Status: He is alert, oriented to person, place, and time and easily aroused. Mental status is at baseline.  Psychiatric:        Behavior: Behavior is cooperative.     Comments: Tic present      Results for orders placed or performed in visit on 03/05/23  CBC with Differential/Platelet   Collection Time: 03/05/23 11:46 AM  Result Value Ref Range   WBC 5.7 3.4 - 10.8 x10E3/uL   RBC 4.17 4.14 - 5.80 x10E6/uL   Hemoglobin 13.2 13.0 - 17.7 g/dL   Hematocrit 14.7 82.9 - 51.0 %   MCV 95 79 - 97 fL   MCH 31.7 26.6 - 33.0 pg   MCHC 33.2 31.5 - 35.7 g/dL   RDW 56.2 13.0 - 86.5 %   Platelets 247 150 - 450 x10E3/uL   Neutrophils 61 Not Estab. %   Lymphs 24 Not Estab. %   Monocytes 9 Not  Estab. %   Eos 4 Not Estab. %   Basos 1 Not Estab. %   Neutrophils Absolute 3.5 1.4 - 7.0 x10E3/uL   Lymphocytes Absolute 1.4 0.7 - 3.1 x10E3/uL   Monocytes Absolute 0.5 0.1 - 0.9 x10E3/uL   EOS (ABSOLUTE) 0.2 0.0 - 0.4 x10E3/uL   Basophils Absolute 0.0 0.0 - 0.2 x10E3/uL   Immature Granulocytes 1 Not Estab. %   Immature Grans (Abs) 0.0 0.0 - 0.1 x10E3/uL  CMP14+EGFR   Collection Time: 03/05/23 11:46 AM   Result Value Ref Range   Glucose 71 70 - 99 mg/dL   BUN 18 8 - 27 mg/dL   Creatinine, Ser 4.09 0.76 - 1.27 mg/dL   eGFR 65 >81 XB/JYN/8.29   BUN/Creatinine Ratio 15 10 - 24   Sodium 141 134 - 144 mmol/L   Potassium 4.8 3.5 - 5.2 mmol/L   Chloride 105 96 - 106 mmol/L   CO2 22 20 - 29 mmol/L   Calcium 9.8 8.6 - 10.2 mg/dL   Total Protein 6.7 6.0 - 8.5 g/dL   Albumin 4.3 3.9 - 4.9 g/dL   Globulin, Total 2.4 1.5 - 4.5 g/dL   Bilirubin Total 0.3 0.0 - 1.2 mg/dL   Alkaline Phosphatase 154 (H) 44 - 121 IU/L   AST 31 0 - 40 IU/L   ALT 26 0 - 44 IU/L  Lipid panel   Collection Time: 03/05/23 11:46 AM  Result Value Ref Range   Cholesterol, Total 164 100 - 199 mg/dL   Triglycerides 71 0 - 149 mg/dL   HDL 73 >56 mg/dL   VLDL Cholesterol Cal 14 5 - 40 mg/dL   LDL Chol Calc (NIH) 77 0 - 99 mg/dL   Chol/HDL Ratio 2.2 0.0 - 5.0 ratio  Hgb A1c w/o eAG   Collection Time: 03/05/23 11:46 AM  Result Value Ref Range   Hgb A1c MFr Bld 4.8 4.8 - 5.6 %       04/07/2023   12:09 PM 03/24/2023    3:50 PM 03/05/2023   11:50 AM 02/04/2023    4:12 PM 12/11/2022    9:12 AM  Depression screen PHQ 2/9  Decreased Interest 0 0 0 0 0  Down, Depressed, Hopeless 0 0 0 0 0  PHQ - 2 Score 0 0 0 0 0  Altered sleeping 0    0  Tired, decreased energy 0    0  Change in appetite 0    0  Feeling bad or failure about yourself  0    0  Trouble concentrating 0    0  Moving slowly or fidgety/restless 0    0  Suicidal thoughts 0    0  PHQ-9 Score 0    0  Difficult doing work/chores     Not difficult at all       04/07/2023   12:09 PM 03/24/2023    3:50 PM 12/11/2022    9:12 AM 08/06/2022    1:25 PM  GAD 7 : Generalized Anxiety Score  Nervous, Anxious, on Edge 0 0 0 0  Control/stop worrying 0 0 0 0  Worry too much - different things 0 0 0 0  Trouble relaxing 0 0 0 0  Restless 0 0 0 0  Easily annoyed or irritable 0 0 0 0  Afraid - awful might happen 0 0 0 0  Total GAD 7 Score 0 0 0 0  Anxiety Difficulty  Not  difficult at all Not difficult at all Not difficult  at all   Pertinent labs & imaging results that were available during my care of the patient were reviewed by me and considered in my medical decision making.  Assessment & Plan:  Hudson was seen today for asthma.  Diagnoses and all orders for this visit:  Shortness of breath Reassuring examination. VSS, lung sounds clear. Will place urgent referral to pulmonology. Imaging as below. Will communicate results to patient once available. Will await results to determine next steps. Discussed strict return precautions. Concern that symptoms may be due to cardiac history. Urged patient to follow up with cardiology. Patient verbalized understanding.  -     Ambulatory referral to Pulmonology -     DG Chest 2 View; Future  Coronary artery disease of native artery of native heart with stable angina pectoris (HCC) As above   Chronic heart failure with preserved ejection fraction (HCC) As above.   Aortic valve stenosis, etiology of cardiac valve disease unspecified As above   Continue all other maintenance medications.  Follow up plan: Return if symptoms worsen or fail to improve.   Continue healthy lifestyle choices, including diet (rich in fruits, vegetables, and lean proteins, and low in salt and simple carbohydrates) and exercise (at least 30 minutes of moderate physical activity daily).  Written and verbal instructions provided   The above assessment and management plan was discussed with the patient. The patient verbalized understanding of and has agreed to the management plan. Patient is aware to call the clinic if they develop any new symptoms or if symptoms persist or worsen. Patient is aware when to return to the clinic for a follow-up visit. Patient educated on when it is appropriate to go to the emergency department.   Neale Burly, DNP-FNP [facility] [address] [place], [place] [address] [phone]

## 2023-04-07 NOTE — Patient Instructions (Signed)
 Follow up with Cardiology

## 2023-04-07 NOTE — Progress Notes (Signed)
 Bilateral bronchopneumonia. Sent in augmentin and zpack. Recommend patient follow up in one week with me or PCP to monitor symptoms.

## 2023-04-07 NOTE — Telephone Encounter (Signed)
 Follow up scheduled with patient today.

## 2023-04-08 ENCOUNTER — Telehealth: Payer: Self-pay | Admitting: Family Medicine

## 2023-04-08 MED ORDER — DEXAMETHASONE 2 MG PO TABS: ORAL_TABLET | ORAL | 0 refills | Status: DC

## 2023-04-08 NOTE — Telephone Encounter (Signed)
Left voicemail asking for return call.

## 2023-04-08 NOTE — Telephone Encounter (Addendum)
 Patient returning phone call. States he did received message regarding holding eliquis.  Please advise, thank you.

## 2023-04-09 ENCOUNTER — Encounter: Payer: Self-pay | Admitting: Internal Medicine

## 2023-04-09 ENCOUNTER — Ambulatory Visit: Admitting: Internal Medicine

## 2023-04-09 VITALS — BP 110/70 | HR 72 | Ht 68.0 in | Wt 176.0 lb

## 2023-04-09 DIAGNOSIS — R0609 Other forms of dyspnea: Secondary | ICD-10-CM | POA: Diagnosis not present

## 2023-04-09 DIAGNOSIS — Z87891 Personal history of nicotine dependence: Secondary | ICD-10-CM

## 2023-04-09 DIAGNOSIS — R918 Other nonspecific abnormal finding of lung field: Secondary | ICD-10-CM

## 2023-04-09 MED ORDER — BUDESONIDE 0.25 MG/2ML IN SUSP: RESPIRATORY_TRACT | 12 refills | Status: DC

## 2023-04-09 NOTE — Patient Instructions (Addendum)
 Finish the antibiotics from your primary care office but hold the prednisone for now   Stop breztri   Continue albuterol 2.5 mg twice daily with budesonide 0.25 mg added in each vial   Only use your albuterol inhaler as a rescue medication to be used if you can't catch your breath by resting or doing a relaxed purse lip breathing pattern.  - The less you use it, the better it will work when you need it. - Ok to use up to 2 puffs  every 4 hours if you must but call for immediate appointment if use goes up over your usual need - Don't leave home without it !!  (think of it like the spare tire for your car)   Work on inhaler technique:  relax and gently blow all the way out then take a nice smooth full deep breath back in, triggering the inhaler at same time you start breathing in.  Hold breath in for at least  5 seconds if you can.     Please remember to go to the lab department   for your tests - we will call you with the results when they are available.  Please schedule a follow up office visit in 1 week  sooner if needed  with all medications /inhalers/ solutions in hand so we can verify exactly what you are taking. This includes all medications from all doctors and over the counters   Cxr before visit

## 2023-04-09 NOTE — Progress Notes (Signed)
 Subjective:     Patient ID: Johnny Mathews, male   DOB: [birth year]    MRN: [id number]    Brief patient profile:  62 yowm quit smoking 1996 but dx of asthma as child and needed inhlalers all his life complicated by pna/ "collapsed lung" but did some better teenager never able to play sports,  eval at [facility]. Had nf,   Started on shots completed by  Teenage years but always used inhalers ? First maint = advair but burned throat but now much worse since 2013 with variable sob/ nightly chest tightness usually better on saba/pred but refractory  for the first time x early Jan 2015 despite dulera dosed 2 puffs at bedtime so [facility] to [facility] 01/26/2014 by Johnny Mathews at the [place]/ Johnny Mathews office with nl pfts on dulera 100 on 02/27/14    History of Present Illness  01/26/2014 1st Johnny Mathews office visit/ Johnny Mathews   Chief Complaint  Patient presents with   Mathews Consult    Referred by Johnny Mathews. Pt c/o dyspnea for the past 3 yrs- worse x 1 month. He states that he gets SOB whith exertion while at work. He also gets SOB when he lies down "feels like sufficating"- uses albuterol 6-7 times per day.   no congestion / mucus but rather dry upper airway  Hacking and poor hfa noted  Worse at hs even  p  dulera  typically feels needs saba within 30 min but only helps a little if at all on nexium at hs rec Plan A = dulera 100 Take 2 puffs first thing in am and then another 2 puffs about 12 hours later and continue singulair 10 mg in evening plus Nexium 40 mg Take 30-60 min before first meal of the day and take zantac 150 mg take 2 at bedtime Plan B Only use your albuterol (proair)  as a rescue medication  GERD (REFLUX)   Stop hyzar and start valsartan 160-25 one daily  Please schedule a follow up office visit in 4 weeks    02/27/2014 f/u ov/Johnny Mathews re:  Asthma   Chief Complaint  Patient presents with   Follow-up    PFT done today. Pt states that his breathing has improved some since the  last visit. He has only used albuterol inhaler x 2 since last seen. No new co's today.   cats are the main problem he's aware of that still triggers any symptoms and as long as avoids them does fine  Rec No change in your medications for now and let's see how you do with the spring allergy season  on this combination  Work on maintaining perfect inhaler technique:  Please schedule a follow up visit in 3 months but call sooner if needed      04/09/2023  f/u ov/Johnny Mathews office/Johnny Mathews re: asthma vs MD  maint on breztri  / just started zpak/ augmentin  04/07/23  Dyspnea:  just walking around the house gets sob  Cough: mostly noct and again in am/ worse for a month > min mucoid / never bloody  Sleeping: 20 degrees and no pillows freq noct cough resp cc  SABA use: neb qam  02: none      No obvious day to day or daytime variability or assoc   mucus plugs or hemoptysis or cp or chest tightness, subjective wheeze or overt sinus or hb symptoms.    Also denies any obvious fluctuation of symptoms with weather or environmental changes or other  aggravating or alleviating factors except as outlined above   No unusual exposure hx or h/o childhood pna or knowledge of premature birth.  Current Allergies, Complete Past Medical History, Past Surgical History, Family History, and Social History were reviewed in [employer] record.  ROS  The following are not active complaints unless bolded Hoarseness, sore throat, dysphagia, dental problems, itching, sneezing,  nasal congestion or discharge of excess mucus or purulent secretions, ear ache,   fever, chills, sweats, unintended wt loss or wt gain, classically pleuritic or exertional cp,  orthopnea pnd or arm/hand swelling  or leg swelling, presyncope, palpitations, abdominal pain, anorexia, nausea, vomiting, diarrhea  or change in bowel habits or change in bladder habits, change in stools or change in urine, dysuria, hematuria,  rash,  arthralgias, visual complaints, headache, numbness, weakness or ataxia or problems with walking or coordination,  change in mood or  memory.        Current Meds  Medication Sig   albuterol (PROVENTIL) (2.5 MG/3ML) 0.083% nebulizer solution NEBULIZE 1 VIAL EVERY 6 HOURS AS NEEDED FOR WHEEZING & SHORTNESS OF BREATH   albuterol (VENTOLIN HFA) 108 (90 Base) MCG/ACT inhaler INHALE 2 PUFFS EVERY 4 HOURS AS NEEDED   amitriptyline (ELAVIL) 100 MG tablet TAKE 2 TABLETS BY MOUTH AT BEDTIME   amoxicillin-clavulanate (AUGMENTIN) 875-125 MG tablet Take 1 tablet by mouth 2 (two) times daily for 7 days.   azelastine (ASTELIN) 0.1 % nasal spray USE 1 SPRAY IN EACH NOSTRIL TWICE DAILY AS NEEDED   azithromycin (ZITHROMAX) 250 MG tablet Take 2 tablets on day 1, then 1 tablet daily on days 2 through 5   blood glucose meter kit and supplies Dispense based on patient and insurance preference. Use up to four times daily as directed. (FOR ICD-10 E10.9, E11.9). Pt states needs One Touch Verio Meter and one touch delica plus test strips   Blood Glucose Monitoring Suppl (CONTOUR BLOOD GLUCOSE SYSTEM) w/Device KIT Test blood sugars four times daily        cyclobenzaprine (FLEXERIL) 10 MG tablet Take 1 tablet (10 mg total) by mouth 3 (three) times daily as needed for muscle spasms. Take 1 tablet by mouth 3 times daily as needed for muscle spasm. Warning: May cause drowsiness.        ELIQUIS 5 MG TABS tablet Take 5 mg by mouth 2 (two) times daily.   EPINEPHRINE 0.3 mg/0.3 mL IJ SOAJ injection INJECT 0.3ML (0.3MG ) IM ONCE   famotidine (PEPCID) 20 MG tablet Take 1 tablet (20 mg total) by mouth 2 (two) times daily.   ferrous sulfate 325 (65 FE) MG tablet Take 325 mg by mouth daily with breakfast.   finasteride (PROSCAR) 5 MG tablet TAKE ONE (1) TABLET EACH DAY   fluticasone (FLONASE) 50 MCG/ACT nasal spray USE 1 TO 2 SPRAYS IN EACH NOSTRIL DAILY   furosemide (LASIX) 20 MG tablet Take 1 tablet (20 mg total) by mouth daily as  needed.   glucose blood (CONTOUR NEXT TEST) test strip Test blood sugars four times daily   Lancets (ONETOUCH DELICA PLUS LANCET33G) MISC    levocetirizine (XYZAL) 5 MG tablet Take 1 tablet (5 mg total) by mouth every evening.   losartan (COZAAR) 50 MG tablet Take 1 tablet (50 mg total) by mouth daily.   meloxicam (MOBIC) 15 MG tablet Take 1 tablet (15 mg total) by mouth daily.   metoprolol succinate (TOPROL-XL) 25 MG 24 hr tablet Take 25 mg by mouth 2 (two) times daily.  montelukast (SINGULAIR) 10 MG tablet TAKE ONE TABLET DAILY AT BEDTIME   OZEMPIC, 1 MG/DOSE, 4 MG/3ML SOPN INJECT 1MG  AS DIRECTED ONCE A WEEK   pravastatin (PRAVACHOL) 80 MG tablet Take 1 tablet (80 mg total) by mouth daily.   Spacer/Aero-Holding Rudean Curt Use with inhaler daily   SYRINGE-NEEDLE, DISP, 3 ML 21G X 1-1/2" 3 ML MISC Use to inject testosterone every week   tadalafil (CIALIS) 10 MG tablet TAKE 1 TABLET BY MOUTH EVERY OTHER DAY AS NEEDED   tamsulosin (FLOMAX) 0.4 MG CAPS capsule TAKE 1 CAPSULE DAILY AFTER SUPPER   testosterone cypionate (DEPOTESTOSTERONE CYPIONATE) 200 MG/ML injection INJECT 100MG  (0.5ML) EVERY 10 DAYS (FOR INTRAMUSCULAR USE ONLY)   traZODone (DESYREL) 50 MG tablet Take 0.5-1 tablets (25-50 mg total) by mouth at bedtime as needed for sleep.    Breztri  2 pufs bid          Objective:   Physical Exam   wts   04/09/2023           176  02/27/2014         194     01/26/14 196 lb (88.905 kg)  01/13/14 192 lb (87.091 kg)  12/26/13 190 lb (86.183 kg)    Vital signs reviewed  04/09/2023  - Note at rest 02 sats  97% on RA   General appearance:    very usual almost spastic face / neck movement in amb wm nad    HEENT : Oropharynx  clear   Nasal turbinates nl    NECK :  without  apparent JVD/ palpable Nodes/TM    LUNGS: no acc muscle use,  Min barrel  contour chest wall with bilateral  slightly decreased bs s audible wheeze and  without cough on insp or exp maneuvers and min  Hyperresonant  to   percussion bilaterally    CV:  RRR  no s3 or murmur or increase in P2, and no edema   ABD:  soft and nontender with pos end  insp Hoover's  in the supine position.  No bruits or organomegaly appreciated   MS:  Nl gait/ ext warm without deformities Or obvious joint restrictions  calf tenderness, cyanosis or clubbing     SKIN: warm and dry without lesions    NEURO:  alert, approp, nl sensorium with  no motor or cerebellar deficits apparent.              I personally reviewed images and agree with radiology impression as follows:  CXR:   pa and lateral  04/07/23  multifocal bilateral patchy airspace disease, with relative sparing of the left upper lobe, consistent with bilateral bronchopneumonia      Assessment:

## 2023-04-09 NOTE — Telephone Encounter (Signed)
 Patient informed and voiced understanding to hold Eliquis x2 days prior to procedure.

## 2023-04-12 NOTE — Progress Notes (Unsigned)
 Subjective:     Patient ID: Johnny Mathews, male   DOB: Sep 24, 1960    MRN: [id number]    Brief patient profile:  63 yowm quit smoking 1996 but dx of asthma as child and needed inhlalers all his life complicated by pna/ "collapsed lung" but did some better teenager never able to play sports,  eval at [facility]. Had nf,   Started on shots completed by  Teenage years but always used inhalers ? First maint = advair but burned throat but now much worse since 2013 with variable sob/ nightly chest tightness usually better on saba/pred but refractory  for the first time x early Jan 2015 despite dulera dosed 2 puffs at bedtime so referred to pulmonary clinic 01/26/2014 by Johnny Mathews at the [place]/ Johnny Mathews office with nl pfts on dulera 100 on 02/27/14    History of Present Illness  01/26/2014 1st [facility] office visit/ Johnny Mathews   Chief Complaint  Patient presents with   Pulmonary Consult    Referred by Johnny Mathews. Pt c/o dyspnea for the past 3 yrs- worse x 1 month. He states that he gets SOB whith exertion while at work. He also gets SOB when he lies down "feels like sufficating"- uses albuterol 6-7 times per day.   no congestion / mucus but rather dry upper airway  Hacking and poor hfa noted  Worse at hs even  p  dulera  typically feels needs saba within 30 min but only helps a little if at all on nexium at hs rec Plan A = dulera 100 Take 2 puffs first thing in am and then another 2 puffs about 12 hours later and continue singulair 10 mg in evening plus Nexium 40 mg Take 30-60 min before first meal of the day and take zantac 150 mg take 2 at bedtime Plan B Only use your albuterol (proair)  as a rescue medication  GERD (REFLUX)   Stop hyzar and start valsartan 160-25 one daily  Please schedule a follow up office visit in 4 weeks    02/27/2014 f/u ov/Johnny Mathews re:  Asthma   Chief Complaint  Patient presents with   Follow-up    PFT done today. Pt states that his breathing has improved some since the  last visit. He has only used albuterol inhaler x 2 since last seen. No new co's today.   cats are the main problem he's aware of that still triggers any symptoms and as long as avoids them does fine  Rec No change in your medications for now and let's see how you do with the spring allergy season  on this combination  Work on maintaining perfect inhaler technique:  Please schedule a follow up visit in 3 months but call sooner if needed      04/09/2023  f/u ov/Johnny Mathews office/Johnny Mathews re: asthma vs MD  maint on breztri  / just started zpak/ augmentin  04/07/23  Dyspnea:  just walking around the house gets sob  Cough: mostly noct and again in am/ worse for a month > min mucoid / never bloody  Sleeping: 20 degrees and no pillows freq noct cough resp cc  SABA use: neb qam  02: none  Rec Finish the antibiotics from your primary care office but hold the prednisone for now  Stop breztri  Continue albuterol 2.5 mg twice daily with budesonide 0.25 mg added in each vial  Only use your albuterol inhaler as a rescue medication  Work on inhaler technique:  Please remember to go to the lab department   for your tests - we will call you with the results when they are available. Please schedule a follow up office visit in 1 week  sooner if needed  with all medications /inhalers/ solutions in hand Cxr before visit      04/15/2023  f/u ov/[facility] office/Johnny Mathews re: asthma/ Moderate AS/ MD maint on singulari/ bud bid and nexium qam/ pepcid hs   did  bring meds   Chief Complaint  Patient presents with   Follow-up    1 week follow up  shortness of breath  pt has all his medication with him   Dyspnea:  improved  Cough: much better  Sleeping: hob up 20 electric bed s  resp cc  -  if flat breathing/coughing  SABA use: just twice daily  02: none     No obvious day to day or daytime variability or assoc excess/ purulent sputum or mucus plugs or hemoptysis or cp or chest tightness, subjective wheeze or  overt sinus or hb symptoms.    Also denies any obvious fluctuation of symptoms with weather or environmental changes or other aggravating or alleviating factors except as outlined above   No unusual exposure hx or h/o childhood pna or knowledge of premature birth.  Current Allergies, Complete Past Medical History, Past Surgical History, Family History, and Social History were reviewed in [employer] record.  ROS  The following are not active complaints unless bolded Hoarseness, sore throat, dysphagia, dental problems, itching, sneezing,  nasal congestion or discharge of excess mucus or purulent secretions, ear ache,   fever, chills, sweats, unintended wt loss or wt gain, classically pleuritic or exertional cp,  orthopnea pnd or arm/hand swelling  or leg swelling, presyncope, palpitations, abdominal pain, anorexia, nausea, vomiting, diarrhea  or change in bowel habits or change in bladder habits, change in stools or change in urine, dysuria, hematuria,  rash, arthralgias, visual complaints, headache, numbness, weakness or ataxia or problems with walking or coordination,  change in mood or  memory.        Current Meds  Medication Sig   albuterol (PROVENTIL) (2.5 MG/3ML) 0.083% nebulizer solution NEBULIZE 1 VIAL EVERY 6 HOURS AS NEEDED FOR WHEEZING & SHORTNESS OF BREATH   albuterol (VENTOLIN HFA) 108 (90 Base) MCG/ACT inhaler INHALE 2 PUFFS EVERY 4 HOURS AS NEEDED   amitriptyline (ELAVIL) 100 MG tablet TAKE 2 TABLETS BY MOUTH AT BEDTIME   azelastine (ASTELIN) 0.1 % nasal spray USE 1 SPRAY IN EACH NOSTRIL TWICE DAILY AS NEEDED   blood glucose meter kit and supplies Dispense based on patient and insurance preference. Use up to four times daily as directed. (FOR ICD-10 E10.9, E11.9). Pt states needs One Touch Verio Meter and one touch delica plus test strips   Blood Glucose Monitoring Suppl (CONTOUR BLOOD GLUCOSE SYSTEM) w/Device KIT Test blood sugars four times daily    budesonide (PULMICORT) 0.25 MG/2ML nebulizer solution One vial twice daily with albuterol   cyclobenzaprine (FLEXERIL) 10 MG tablet Take 1 tablet (10 mg total) by mouth 3 (three) times daily as needed for muscle spasms. Take 1 tablet by mouth 3 times daily as needed for muscle spasm. Warning: May cause drowsiness.   dexamethasone (DECADRON) 2 MG tablet Take 4 tablets for 3 days then 3 tablets for 3 days then 2 tablets for 3 days then 1 tablet for 3 days   ELIQUIS 5 MG TABS tablet Take 5 mg by mouth 2 (two) times  daily.   EPINEPHRINE 0.3 mg/0.3 mL IJ SOAJ injection INJECT 0.3ML (0.3MG ) IM ONCE   famotidine (PEPCID) 20 MG tablet Take 1 tablet (20 mg total) by mouth 2 (two) times daily.   ferrous sulfate 325 (65 FE) MG tablet Take 325 mg by mouth daily with breakfast.   finasteride (PROSCAR) 5 MG tablet TAKE ONE (1) TABLET EACH DAY   fluticasone (FLONASE) 50 MCG/ACT nasal spray USE 1 TO 2 SPRAYS IN EACH NOSTRIL DAILY   furosemide (LASIX) 20 MG tablet Take 1 tablet (20 mg total) by mouth daily as needed.   glucose blood (CONTOUR NEXT TEST) test strip Test blood sugars four times daily   Lancets (ONETOUCH DELICA PLUS LANCET33G) MISC    levocetirizine (XYZAL) 5 MG tablet Take 1 tablet (5 mg total) by mouth every evening.   losartan (COZAAR) 50 MG tablet Take 1 tablet (50 mg total) by mouth daily.   meloxicam (MOBIC) 15 MG tablet Take 1 tablet (15 mg total) by mouth daily.   metoprolol succinate (TOPROL-XL) 25 MG 24 hr tablet Take 25 mg by mouth 2 (two) times daily.   montelukast (SINGULAIR) 10 MG tablet TAKE ONE TABLET DAILY AT BEDTIME   OZEMPIC, 1 MG/DOSE, 4 MG/3ML SOPN INJECT 1MG  AS DIRECTED ONCE A WEEK   pravastatin (PRAVACHOL) 80 MG tablet Take 1 tablet (80 mg total) by mouth daily.   Spacer/Aero-Holding Rudean Curt Use with inhaler daily   SYRINGE-NEEDLE, DISP, 3 ML 21G X 1-1/2" 3 ML MISC Use to inject testosterone every week   tadalafil (CIALIS) 10 MG tablet TAKE 1 TABLET BY MOUTH EVERY OTHER  DAY AS NEEDED   tamsulosin (FLOMAX) 0.4 MG CAPS capsule TAKE 1 CAPSULE DAILY AFTER SUPPER   testosterone cypionate (DEPOTESTOSTERONE CYPIONATE) 200 MG/ML injection INJECT 100MG  (0.5ML) EVERY 10 DAYS (FOR INTRAMUSCULAR USE ONLY)   traZODone (DESYREL) 50 MG tablet Take 0.5-1 tablets (25-50 mg total) by mouth at bedtime as needed for sleep.             Objective:   Physical Exam   wts   04/15/2023           181  04/09/2023           176  02/27/2014         194     01/26/14 196 lb (88.905 kg)  01/13/14 192 lb (87.091 kg)  12/26/13 190 lb (86.183 kg)      Vital signs reviewed  04/15/2023  - Note at rest 02 sats  95% on RA   General appearance:    amb wm with spontaneous unusual movement s of trunk / face/ arms    HEENT : Oropharynx  clear   Nasal turbinates nl    NECK :  without  apparent JVD/ palpable Nodes/TM    LUNGS: no acc muscle use,  Min barrel  contour chest wall with bilateral minimal insp/exp rhonchi R>L  s audible wheeze and  without cough on insp or exp maneuvers and min  Hyperresonant  to  percussion bilaterally    CV:  RRR  no s3 or murmur or increase in P2, and no edema   ABD:  soft and nontender with pos end  insp Hoover's  in the supine position.  No bruits or organomegaly appreciated   MS:  Nl gait/ ext warm without deformities Or obvious joint restrictions  calf tenderness, cyanosis or clubbing     SKIN: warm and dry without lesions    NEURO:  alert, approp,  nl sensorium             CXR PA and Lateral:   04/15/2023 :    I personally reviewed images and impression is as follows:     Improved aeration RUL      Assessment:

## 2023-04-15 ENCOUNTER — Encounter: Payer: Self-pay | Admitting: Internal Medicine

## 2023-04-15 ENCOUNTER — Ambulatory Visit: Admitting: Internal Medicine

## 2023-04-15 ENCOUNTER — Ambulatory Visit (HOSPITAL_COMMUNITY)
Admission: RE | Admit: 2023-04-15 | Discharge: 2023-04-15 | Disposition: A | Discharge status: Home / Self Care | Source: Ambulatory Visit | Attending: Internal Medicine | Admitting: Internal Medicine

## 2023-04-15 VITALS — BP 98/58 | HR 95 | Ht 68.0 in | Wt 181.4 lb

## 2023-04-15 DIAGNOSIS — J189 Pneumonia, unspecified organism: Secondary | ICD-10-CM | POA: Diagnosis not present

## 2023-04-15 DIAGNOSIS — R0609 Other forms of dyspnea: Secondary | ICD-10-CM | POA: Insufficient documentation

## 2023-04-15 DIAGNOSIS — R918 Other nonspecific abnormal finding of lung field: Secondary | ICD-10-CM | POA: Diagnosis not present

## 2023-04-15 MED ORDER — BUDESONIDE-FORMOTEROL FUMARATE 80-4.5 MCG/ACT IN AERO: INHALATION_SPRAY | RESPIRATORY_TRACT | 12 refills | Status: DC

## 2023-04-15 MED ORDER — ESOMEPRAZOLE MAGNESIUM 40 MG PO CPDR: 40.0000 mg | DELAYED_RELEASE_CAPSULE | Freq: Every day | ORAL | 11 refills | Status: AC

## 2023-04-15 NOTE — Assessment & Plan Note (Addendum)
 Quit smoking 1996 with h/o childhood asthma - 04/09/2023   Walked on RA  x  3  lap(s) =  approx 450  ft  @ mod pace, stopped due to end of study  with lowest 02 sats 94%   - 04/15/2023  After extensive coaching inhaler device,  effectiveness =    80% with hfa so start symbicort 80 2bid and use ABC plan

## 2023-04-15 NOTE — Assessment & Plan Note (Addendum)
 Onset of symptoms ? Early March 2025 > rx as pna 04/07/23 with augmentin / zpak 04/07/23 > cxr improved 04/15/2023  - labs for opportunistic organisms pending as of 04/15/2023   >>> no further abx/ f/u cxr in 6 weeks unless clinical worsening  Comment: Overall much improved clinically with expected radiographic response          Each maintenance medication was reviewed in detail including emphasizing most importantly the difference between maintenance and prns and under what circumstances the prns are to be triggered using an action plan format where appropriate.  Total time for H and P, chart review, counseling, reviewing hfa device(s) and generating customized AVS unique to this office visit / same day charting = 30 min

## 2023-04-15 NOTE — Patient Instructions (Addendum)
 Plan A = Automatic = Always=    symbicort 80 Take 2 puffs first thing in am and then another 2 puffs about 12 hours later.    Work on inhaler technique:  relax and gently blow all the way out then take a nice smooth full deep breath back in, triggering the inhaler at same time you start breathing in.  Hold breath in for at least  5 seconds if you can. Blow out symbicort  thru nose. Rinse and gargle with water when done.  If mouth or throat bother you at all,  try brushing teeth/gums/tongue with arm and hammer toothpaste/ make a slurry and gargle and spit out.   Plan B = Backup (to supplement plan A, not to replace it) Only use your albuterol inhaler as a rescue medication to be used if you can't catch your breath by resting or doing a relaxed purse lip breathing pattern.  - The less you use it, the better it will work when you need it. - Ok to use the inhaler up to 2 puffs  every 4 hours if you must but call for appointment if use goes up over your usual need - Don't leave home without it !!  (think of it like the spare tire for your car)   Plan C = Crisis (instead of Plan B but only if Plan B stops working) - only use your albuterol/budesonide  nebulizer if you first try Plan B and it fails to help > ok to use the nebulizer up to every 4 hours but if start needing it regularly call for immediate appointment     Please schedule a follow up office visit in 6 weeks, call sooner if needed with all respiratory medications /inhalers/ solutions in hand so we can verify exactly what you are taking. This includes all medications from all doctors and over the counters

## 2023-04-16 ENCOUNTER — Other Ambulatory Visit: Payer: Self-pay

## 2023-04-16 MED ORDER — BUDESONIDE-FORMOTEROL FUMARATE 80-4.5 MCG/ACT IN AERO: INHALATION_SPRAY | RESPIRATORY_TRACT | 12 refills | Status: AC

## 2023-04-18 DIAGNOSIS — Z95818 Presence of other cardiac implants and grafts: Secondary | ICD-10-CM | POA: Diagnosis not present

## 2023-04-19 LAB — CBC WITH DIFFERENTIAL/PLATELET
Basophils Absolute: 0.1 10*3/uL (ref 0.0–0.2)
Basos: 1 %
EOS (ABSOLUTE): 0.6 10*3/uL — ABNORMAL HIGH (ref 0.0–0.4)
Eos: 9 %
Hematocrit: 40.1 % (ref 37.5–51.0)
Hemoglobin: 12.8 g/dL — ABNORMAL LOW (ref 13.0–17.7)
Immature Grans (Abs): 0.1 10*3/uL (ref 0.0–0.1)
Immature Granulocytes: 1 %
Lymphocytes Absolute: 2.1 10*3/uL (ref 0.7–3.1)
Lymphs: 30 %
MCH: 31.5 pg (ref 26.6–33.0)
MCHC: 31.9 g/dL (ref 31.5–35.7)
MCV: 99 fL — ABNORMAL HIGH (ref 79–97)
Monocytes Absolute: 0.6 10*3/uL (ref 0.1–0.9)
Monocytes: 8 %
Neutrophils Absolute: 3.5 10*3/uL (ref 1.4–7.0)
Neutrophils: 51 %
Platelets: 315 10*3/uL (ref 150–450)
RBC: 4.06 x10E6/uL — ABNORMAL LOW (ref 4.14–5.80)
RDW: 12.3 % (ref 11.6–15.4)
WBC: 6.9 10*3/uL (ref 3.4–10.8)

## 2023-04-19 LAB — QUANTIFERON-TB GOLD PLUS
QuantiFERON Mitogen Value: 9.69 [IU]/mL
QuantiFERON Nil Value: 0.08 [IU]/mL
QuantiFERON TB1 Ag Value: 0.06 [IU]/mL
QuantiFERON TB2 Ag Value: 0.06 [IU]/mL
QuantiFERON-TB Gold Plus: NEGATIVE

## 2023-04-19 LAB — SEDIMENTATION RATE: Sed Rate: 43 mm/h — ABNORMAL HIGH (ref 0–30)

## 2023-04-19 LAB — BASIC METABOLIC PANEL WITH GFR
BUN/Creatinine Ratio: 15 (ref 10–24)
BUN: 16 mg/dL (ref 8–27)
CO2: 25 mmol/L (ref 20–29)
Calcium: 9.5 mg/dL (ref 8.6–10.2)
Chloride: 101 mmol/L (ref 96–106)
Creatinine, Ser: 1.04 mg/dL (ref 0.76–1.27)
Glucose: 59 mg/dL — ABNORMAL LOW (ref 70–99)
Potassium: 4.4 mmol/L (ref 3.5–5.2)
Sodium: 142 mmol/L (ref 134–144)
eGFR: 80 mL/min/{1.73_m2} (ref >59)

## 2023-04-19 LAB — IGE: IgE (Immunoglobulin E), Serum: 1226 [IU]/mL — ABNORMAL HIGH (ref 6–495)

## 2023-04-19 LAB — BLASTOMYCES ANTIGEN
Blastomyces Antigen: NOT DETECTED ng/mL
Interpretation: NEGATIVE

## 2023-04-19 LAB — CRYPTOCOCCAL ANTIGEN: Cryptococcus Antigen, Serum: NEGATIVE

## 2023-04-19 LAB — BRAIN NATRIURETIC PEPTIDE: BNP: 153.3 pg/mL — ABNORMAL HIGH (ref 0.0–100.0)

## 2023-04-19 LAB — HISTOPLASMA ANTIGEN, URINE

## 2023-04-21 ENCOUNTER — Encounter: Payer: Self-pay | Admitting: Nurse Practitioner

## 2023-04-21 ENCOUNTER — Ambulatory Visit (INDEPENDENT_AMBULATORY_CARE_PROVIDER_SITE_OTHER): Admitting: Nurse Practitioner

## 2023-04-21 VITALS — BP 133/74 | HR 76

## 2023-04-21 DIAGNOSIS — S61012A Laceration without foreign body of left thumb without damage to nail, initial encounter: Secondary | ICD-10-CM | POA: Diagnosis not present

## 2023-04-21 MED ORDER — AMOXICILLIN 875 MG PO TABS: 875.0000 mg | ORAL_TABLET | Freq: Two times a day (BID) | ORAL | 0 refills | Status: DC

## 2023-04-21 NOTE — Progress Notes (Signed)
 Acute Office Visit  Subjective:     Patient ID: Johnny Mathews, male    DOB: 07/25/1960, 63 y.o.   MRN: [id number]  Chief Complaint  Patient presents with   Laceration    Cut to left thumb    HPI The patient is a 63 year old male who presents 04/21/2023 for an acute visit concerns for  left thumb laceration. He reports that he cut himself with a knife while working and was not wearing gloves at the time sustained laceration of left thumb 1 hours ago. Nature of injury: knife. 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   Anginal equivalent (HCC) 01/18/2019   Chronic heart failure with preserved ejection fraction (HCC) 01/18/2019   DOE (dyspnea on exertion) 01/18/2019   BPH (benign prostatic hyperplasia) 11/09/2019   Hiatal hernia 08/27/2020   Recurrent right inguinal hernia    Anemia 04/05/2020   Status post placement of implantable loop recorder 12/10/2020   PAF (paroxysmal atrial fibrillation) (HCC) 01/17/2021   Myopathy 03/20/2023   Mitochondrial myopathy 07/25/2014   Coronary artery disease of native artery of native heart with stable angina pectoris (HCC) 04/18/2019   Asthma 03/20/2023   Aortic valve stenosis 07/19/2019   Pulmonary infiltrates on CXR 04/09/2023   Laceration of left thumb without foreign body without damage to nail 04/21/2023   Resolved Ambulatory Problems    Diagnosis Date Noted   Right shoulder pain 03/02/2012   Fluid retention  12/23/2012   Right knee meniscal tear 10/26/2013   Diabetes mellitus without complication (HCC)    Influenza A 01/19/2016   Flu-like symptoms 01/19/2016   Acute bronchitis 01/19/2016   Mass of right upper extremity 07/21/2018   History of 2019 novel coronavirus disease (COVID-19) 01/18/2019   Rib pain on right side 05/05/2019   History of COVID-19 05/10/2020   Bilateral inguinal hernia without obstruction or gangrene 07/18/2021   Past Medical History:  Diagnosis Date   Allergy    Alpha galactosidase deficiency    Arthritis    Blood transfusion without reported diagnosis    BPH (benign prostatic hypertrophy)    Cataract    Complication of anesthesia    Dysrhythmia    Heart murmur    History of kidney stones    HOH (hard of hearing)    Hyperlipidemia    Hypertension    Irregular heart beat    PONV (postoperative nausea and vomiting)    Sleep apnea    Wears hearing aid in both ears     ROS Negative unless indicated in HPI    Objective:    BP 133/74   Pulse 76   SpO2 94%  BP Readings from Last 3 Encounters:  04/21/23 133/74  04/15/23 (!) 98/58  04/09/23 110/70   Wt Readings from Last 3 Encounters:  04/15/23 181 lb 6.4 oz (82.3 kg)  04/09/23 176 lb (79.8 kg)  04/07/23 181 lb (82.1 kg)      Physical Exam  Patient appears well, vitals are normal. Laceration 6 cm noted.  Description: clean wound edges, no foreign bodies. Neurovascular and tendon structures are intact. No results found for any visits on 04/21/23.      Assessment & Plan:  Laceration of left thumb without foreign body without damage to nail, initial encounter -     Amoxicillin; Take 1 tablet (875 mg total) by mouth 2 (two) times daily.  Dispense: 14 tablet; Refill: 0   Johnny Mathews is a 63 year old Caucasian male seen today for laceration repair, no acute distress Laceration as described. Laceration repair  Date/Time: 04/21/2023 8:50 PM  Performed by: Martina Sinner, NP Authorized by: Martina Sinner, NP   Consent:    Consent obtained:  Verbal   Consent given by:  Patient   Risks, benefits, and alternatives were discussed: yes     Risks discussed:  Infection and pain   Alternatives discussed:  No treatment Anesthesia:    Anesthesia method:  None Laceration details:    Location:  Finger   Finger location:  L thumb   Length (cm):  6   Depth (mm):  1.5 Pre-procedure details:    Preparation:  Patient was prepped and draped in usual sterile fashion Exploration:    Limited defect created (wound extended): no     Hemostasis achieved with:  Direct pressure   Imaging obtained comment:  No imaging   Foreign body noted: no imaging.     Wound exploration: wound explored through full range of motion     Wound extent: no fascia violation noted, no foreign bodies/material noted, no muscle damage noted, no nerve damage noted, no tendon damage noted, no underlying fracture noted and no vascular damage noted     Contaminated: no   Treatment:    Area cleansed with:  Povidone-iodine   Amount of cleaning:  Standard   Irrigation solution:  Sterile water   Irrigation method:  Syringe   Visualized foreign bodies/material removed: no (no foreign body)     Debridement:  None   Undermining:  None   Scar revision: no   Skin repair:    Repair method:  Sutures   Suture size:  3-0   Suture material:  Prolene   Suture technique:  Simple interrupted   Number of sutures:  5 Approximation:    Approximation:  Close Repair type:    Repair type:  Simple Post-procedure details:    Dressing:  Antibiotic ointment and non-adherent dressing   Procedure completion:  Tolerated well, no immediate complications   PLAN:  Amoxicillin 875 twice daily for 7 days #14 dispense  Antibiotic ointment and dressing applied.   Wound care instructions provided.   Observe for any signs of infection or other problems.  Return for suture removal in 10 days.       The above assessment and  management plan was discussed with the patient. The patient verbalized understanding of and has agreed to the management plan. Patient is aware to call the clinic if they develop any new symptoms or if symptoms persist or worsen. Patient is aware when to return to the clinic for a follow-up visit. Patient educated on when it is appropriate to go to the emergency department.  Return in about 10 days (around 05/01/2023) for suture removal.  Martina Sinner, DNP [facility] [address] [place], [place] [address] [phone]  Note: This document was prepared by [facility] voice dictation technology and any errors that results from this process are unintentional.

## 2023-04-22 ENCOUNTER — Encounter: Payer: Self-pay | Admitting: Gastroenterology

## 2023-04-22 ENCOUNTER — Ambulatory Visit (AMBULATORY_SURGERY_CENTER): Admitting: Gastroenterology

## 2023-04-22 VITALS — BP 144/76 | HR 66 | Temp 98.2°F | Resp 14 | Ht 68.0 in | Wt 179.0 lb

## 2023-04-22 DIAGNOSIS — Z860100 Personal history of colon polyps, unspecified: Secondary | ICD-10-CM | POA: Diagnosis not present

## 2023-04-22 DIAGNOSIS — Z860101 Personal history of adenomatous and serrated colon polyps: Secondary | ICD-10-CM | POA: Diagnosis not present

## 2023-04-22 DIAGNOSIS — K219 Gastro-esophageal reflux disease without esophagitis: Secondary | ICD-10-CM | POA: Diagnosis not present

## 2023-04-22 DIAGNOSIS — Z1211 Encounter for screening for malignant neoplasm of colon: Secondary | ICD-10-CM

## 2023-04-22 DIAGNOSIS — Z9889 Other specified postprocedural states: Secondary | ICD-10-CM | POA: Diagnosis not present

## 2023-04-22 MED ORDER — SODIUM CHLORIDE 0.9 % IV SOLN: 500.0000 mL | Freq: Once | INTRAVENOUS | Status: DC

## 2023-04-22 NOTE — Op Note (Signed)
 [facility] Patient Name: Johnny Mathews Procedure Date: 04/22/2023 1:17 PM MRN: [id number] Endoscopist: Sherilyn Cooter L. Myrtie Neither , MD, [id number] Age: 63 Referring MD:  Date of Birth: [birth year] Gender: Male Account #: [id number] Procedure:                Upper GI endoscopy Indications:              Screening for Barrett's esophagus in patient at                            risk for this condition, Assessment following                            Nissen fundoplication (distant fundoplication, then                            redo in 2022 Medicines:                Monitored Anesthesia Care Procedure:                Pre-Anesthesia Assessment:                           - Prior to the procedure, a History and Physical                            was performed, and patient medications and                            allergies were reviewed. The patient's tolerance of                            previous anesthesia was also reviewed. The risks                            and benefits of the procedure and the sedation                            options and risks were discussed with the patient.                            All questions were answered, and informed consent                            was obtained. Prior Anticoagulants: The patient has                            taken Eliquis (apixaban), last dose was 2 days                            prior to procedure. ASA Grade Assessment: III - A                            patient with severe systemic disease. After  reviewing the risks and benefits, the patient was                            deemed in satisfactory condition to undergo the                            procedure.                           After obtaining informed consent, the endoscope was                            passed under direct vision. Throughout the                            procedure, the patient's blood pressure, pulse, and                             oxygen saturations were monitored continuously. The                            Olympus scope [id number] was introduced through the                            mouth, and advanced to the second part of duodenum.                            The upper GI endoscopy was accomplished without                            difficulty. The patient tolerated the procedure                            well. Scope In: Scope Out: Findings:                 There is no endoscopic evidence of Barrett's                            esophagus, esophagitis or hiatal hernia in the                            entire esophagus.                           Evidence of a fundoplication was found in the                            gastric fundus. The wrap appeared intact. This was                            traversed.                           The exam of the stomach was otherwise normal.  The examined duodenum was normal. Complications:            No immediate complications. Estimated Blood Loss:     Estimated blood loss: none. Impression:               - A fundoplication was found. The wrap appears                            intact.                           - Normal examined duodenum.                           - No specimens collected. Recommendation:           - Patient has a contact number available for                            emergencies. The signs and symptoms of potential                            delayed complications were discussed with the                            patient. Return to normal activities tomorrow.                            Written discharge instructions were provided to the                            patient.                           - Resume previous diet.                           - Resume Eliquis (apixaban) at prior dose today.                           - See the other procedure note for documentation of                            additional recommendations.                            Consider stopping the famotidine since you might                            not need it any longer for heartburn after your                            fundoplication surgery. Solomia Harrell L. Myrtie Neither, MD 04/22/2023 2:07:10 PM This report has been signed electronically.

## 2023-04-22 NOTE — Op Note (Signed)
 [facility] Patient Name: Johnny Mathews Procedure Date: 04/22/2023 1:17 PM MRN: [id number] Endoscopist: Sherilyn Cooter L. Myrtie Neither , MD, [id number] Age: 63 Referring MD:  Date of Birth: 11/01/1960 Gender: Male Account #: [id number] Procedure:                Colonoscopy Indications:              Increased risk colon polyp surveillance: Personal                            history of colonic polyps                           sever adenomatous and serrated polyps (some > 10mm)                            April 2021 Medicines:                Monitored Anesthesia Care Procedure:                Pre-Anesthesia Assessment:                           - Prior to the procedure, a History and Physical                            was performed, and patient medications and                            allergies were reviewed. The patient's tolerance of                            previous anesthesia was also reviewed. The risks                            and benefits of the procedure and the sedation                            options and risks were discussed with the patient.                            All questions were answered, and informed consent                            was obtained. Prior Anticoagulants: The patient has                            taken Eliquis (apixaban), last dose was 2 days                            prior to procedure. ASA Grade Assessment: III - A                            patient with severe systemic disease. After  reviewing the risks and benefits, the patient was                            deemed in satisfactory condition to undergo the                            procedure.                           After obtaining informed consent, the colonoscope                            was passed under direct vision. Throughout the                            procedure, the patient's blood pressure, pulse, and                            oxygen saturations were  monitored continuously. The                            CF [id number] #[id number] was introduced through the anus                            and advanced to the the cecum, identified by                            appendiceal orifice and ileocecal valve. The                            colonoscopy was somewhat difficult due to poor                            bowel prep and a redundant colon. Successful                            completion of the procedure was aided by changing                            the patient to a semi-prone position, using manual                            pressure and straightening and shortening the scope                            to obtain bowel loop reduction. The patient                            tolerated the procedure well. The quality of the                            bowel preparation was poor. The ileocecal valve,  appendiceal orifice, and rectum were photographed.                            The bowel preparation used was SUFLAVE. Scope In: 1:27:44 PM Scope Out: 1:42:39 PM Scope Withdrawal Time: 0 hours 7 minutes 40 seconds  Total Procedure Duration: 0 hours 14 minutes 55 seconds  Findings:                 The perianal and digital rectal examinations were                            normal.                           A large amount of opaque liquid and solid stool was                            found in the entire colon, interfering with                            visualization. Could not be cleared well with                            lavage.                           The exam was otherwise without abnormality (given                            limitations of visualization). Complications:            No immediate complications. Estimated Blood Loss:     Estimated blood loss: none. Impression:               - Preparation of the colon was poor.                           - Stool in the entire examined colon.                           - The  examination was otherwise normal.                           - No specimens collected. Recommendation:           - Patient has a contact number available for                            emergencies. The signs and symptoms of potential                            delayed complications were discussed with the                            patient. Return to normal activities tomorrow.  Written discharge instructions were provided to the                            patient.                           - Resume previous diet.                           - Resume Eliquis (apixaban) at prior dose today.                           - Reschedule colonoscopy with split-dose PEG prep                            within 6 months.                           see EGD report Ace Abu L. Dominic Friendly, MD 04/22/2023 1:56:05 PM This report has been signed electronically.

## 2023-04-22 NOTE — Patient Instructions (Signed)
 Discharge instructions given. Normal exam. Recall placed for repeat colonoscopy within 6 months. Resume previous medications. YOU HAD AN ENDOSCOPIC PROCEDURE TODAY AT THE [facility]:   Refer to the procedure report that was given to you for any specific questions about what was found during the examination.  If the procedure report does not answer your questions, please call your gastroenterologist to clarify.  If you requested that your care partner not be given the details of your procedure findings, then the procedure report has been included in a sealed envelope for you to review at your convenience later.  YOU SHOULD EXPECT: Some feelings of bloating in the abdomen. Passage of more gas than usual.  Walking can help get rid of the air that was put into your GI tract during the procedure and reduce the bloating. If you had a lower endoscopy (such as a colonoscopy or flexible sigmoidoscopy) you may notice spotting of blood in your stool or on the toilet paper. If you underwent a bowel prep for your procedure, you may not have a normal bowel movement for a few days.  Please Note:  You might notice some irritation and congestion in your nose or some drainage.  This is from the oxygen used during your procedure.  There is no need for concern and it should clear up in a day or so.  SYMPTOMS TO REPORT IMMEDIATELY:  Following lower endoscopy (colonoscopy or flexible sigmoidoscopy):  Excessive amounts of blood in the stool  Significant tenderness or worsening of abdominal pains  Swelling of the abdomen that is new, acute  Fever of 100F or higher  Following upper endoscopy (EGD)  Vomiting of blood or coffee ground material  New chest pain or pain under the shoulder blades  Painful or persistently difficult swallowing  New shortness of breath  Fever of 100F or higher  Black, tarry-looking stools  For urgent or emergent issues, a gastroenterologist can be reached at any hour by  calling [phone]. Do not use MyChart messaging for urgent concerns.    DIET:  We do recommend a small meal at first, but then you may proceed to your regular diet.  Drink plenty of fluids but you should avoid alcoholic beverages for 24 hours.  ACTIVITY:  You should plan to take it easy for the rest of today and you should NOT DRIVE or use heavy machinery until tomorrow (because of the sedation medicines used during the test).    FOLLOW UP: Our staff will call the number listed on your records the next business day following your procedure.  We will call around 7:15- 8:00 am to check on you and address any questions or concerns that you may have regarding the information given to you following your procedure. If we do not reach you, we will leave a message.     If any biopsies were taken you will be contacted by phone or by letter within the next 1-3 weeks.  Please call us at [phone] if you have not heard about the biopsies in 3 weeks.    SIGNATURES/CONFIDENTIALITY: You and/or your care partner have signed paperwork which will be entered into your electronic medical record.  These signatures attest to the fact that that the information above on your After Visit Summary has been reviewed and is understood.  Full responsibility of the confidentiality of this discharge information lies with you and/or your care-partner.

## 2023-04-22 NOTE — Progress Notes (Signed)
 No significant changes to clinical history since GI office visit on 03/24/23.  The patient is appropriate for an endoscopic procedure in the ambulatory setting.  - Amada Jupiter, MD

## 2023-04-22 NOTE — Progress Notes (Signed)
 Sedate, gd SR, tolerated procedure well, VSS, report to RN

## 2023-04-23 ENCOUNTER — Telehealth: Payer: Self-pay | Admitting: *Deleted

## 2023-04-23 NOTE — Telephone Encounter (Signed)
  Follow up Call-     04/22/2023   12:42 PM  Call back number  Post procedure Call Back phone  # [phone]  Permission to leave phone message Yes     Patient questions:  Message left to call us  if necessary.

## 2023-05-05 ENCOUNTER — Encounter: Payer: Self-pay | Admitting: Family Medicine

## 2023-05-05 ENCOUNTER — Ambulatory Visit: Admitting: Nurse Practitioner

## 2023-05-06 ENCOUNTER — Encounter: Payer: Self-pay | Admitting: Family Medicine

## 2023-05-06 ENCOUNTER — Ambulatory Visit: Admitting: Nurse Practitioner

## 2023-05-11 DIAGNOSIS — I480 Paroxysmal atrial fibrillation: Secondary | ICD-10-CM | POA: Diagnosis not present

## 2023-05-11 DIAGNOSIS — Z95818 Presence of other cardiac implants and grafts: Secondary | ICD-10-CM | POA: Diagnosis not present

## 2023-05-11 DIAGNOSIS — Z7901 Long term (current) use of anticoagulants: Secondary | ICD-10-CM | POA: Diagnosis not present

## 2023-05-18 ENCOUNTER — Encounter (HOSPITAL_COMMUNITY): Payer: Self-pay

## 2023-05-19 DIAGNOSIS — Z95818 Presence of other cardiac implants and grafts: Secondary | ICD-10-CM | POA: Diagnosis not present

## 2023-05-25 ENCOUNTER — Ambulatory Visit: Admitting: Family Medicine

## 2023-05-25 ENCOUNTER — Encounter: Payer: Self-pay | Admitting: Family Medicine

## 2023-05-25 VITALS — BP 133/77 | HR 76 | Ht 68.0 in | Wt 184.0 lb

## 2023-05-25 DIAGNOSIS — S61012D Laceration without foreign body of left thumb without damage to nail, subsequent encounter: Secondary | ICD-10-CM | POA: Diagnosis not present

## 2023-05-25 DIAGNOSIS — G7100 Muscular dystrophy, unspecified: Secondary | ICD-10-CM

## 2023-05-25 NOTE — Progress Notes (Signed)
 BP 133/77   Pulse 76   Ht 5\' 8"  (1.727 m)   Wt 184 lb (83.5 kg)   SpO2 97%   BMI 27.98 kg/m    Subjective:   Patient ID: Johnny Mathews, male    DOB: 01-Oct-1960, 63 y.o.   MRN: [id number]  HPI: Johnny Mathews is a 63 y.o. male presenting on 05/25/2023 for Medical Management of Chronic Issues   HPI Thumb laceration Patient is a thumb laceration on the dorsum of his right thumb overlying the CMC joint.  Sutures removed 2 weeks ago by him and it looks like it is healing well, no signs of infection.  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  Patient still having some shortness of breath and is seeing pulmonology for this.  Relevant past medical, surgical, family and social history reviewed and updated as indicated. Interim medical history since our last visit reviewed. Allergies and medications reviewed and updated.  Review of Systems  Constitutional:  Negative for chills and fever.  Eyes:  Negative for visual disturbance.  Respiratory:  Positive for shortness of breath. Negative for wheezing.   Cardiovascular:  Negative for chest pain and leg swelling.  Musculoskeletal:  Positive for myalgias. Negative for back pain and gait problem.  Skin:  Negative for rash.  Neurological:  Positive for tremors and weakness.  All other systems reviewed and are negative.   Per HPI unless specifically indicated above   Allergies as of 05/25/2023       Reactions   Egg-derived Products Swelling   Throat- 03-31-2019 pt states can eat cakes and Pie with no issues  Throat- 03-31-2019 pt states can eat cakes and Pie with no issues    Septra [sulfamethoxazole-trimethoprim] Swelling   throat   Sulfonamide Derivatives Hives   Latex Rash        Medication List        Accurate as of May 25, 2023  3:42 PM. If you have any questions, ask your nurse or doctor.           albuterol  (2.5 MG/3ML) 0.083% nebulizer solution Commonly known as: PROVENTIL  NEBULIZE 1 VIAL EVERY 6 HOURS AS NEEDED FOR WHEEZING & SHORTNESS OF BREATH   albuterol  108 (90 Base) MCG/ACT inhaler Commonly known as: VENTOLIN  HFA INHALE 2 PUFFS EVERY 4 HOURS AS NEEDED   amitriptyline  100 MG tablet Commonly known as: ELAVIL  TAKE 2 TABLETS BY MOUTH AT BEDTIME   azelastine  0.1 % nasal spray Commonly known as: ASTELIN  USE 1 SPRAY IN EACH NOSTRIL TWICE DAILY AS NEEDED   blood glucose meter kit and supplies Dispense based on patient and insurance preference. Use up to four times daily as directed. (FOR ICD-10 E10.9, E11.9). Pt states needs One Touch Verio Meter and one touch delica plus test strips   budesonide  0.25 MG/2ML nebulizer solution Commonly known as: Pulmicort  One vial twice daily with albuterol    budesonide -formoterol  80-4.5 MCG/ACT inhaler Commonly known as: Symbicort  Take 2 puffs first thing in am and then another 2 puffs about 12 hours later.   Contour Blood Glucose System w/Device Kit Test blood sugars four times daily   cyclobenzaprine  10 MG tablet Commonly known as: FLEXERIL  Take 1 tablet (10 mg total) by mouth 3 (three) times daily as needed for muscle spasms. Take 1 tablet by mouth 3 times daily as needed for muscle spasm. Warning:  May cause drowsiness.   Eliquis 5 MG Tabs tablet Generic drug: apixaban Take 5 mg by mouth 2 (two) times daily.   EPINEPHrine  0.3 mg/0.3 mL Soaj injection Commonly known as: EPI-PEN INJECT 0.3ML (0.3MG ) IM ONCE   esomeprazole  40 MG capsule Commonly known as: NexIUM  Take 1 capsule (40 mg total) by mouth daily at 12 noon. Take 30-60 min before first meal of the day   famotidine  20 MG tablet Commonly known as: PEPCID  Take 1 tablet (20 mg total) by mouth 2 (two) times daily.   ferrous sulfate  325 (65 FE) MG tablet Take 325 mg by mouth daily with breakfast.   finasteride  5 MG tablet Commonly known as: PROSCAR  TAKE ONE  (1) TABLET EACH DAY   fluticasone  50 MCG/ACT nasal spray Commonly known as: FLONASE  USE 1 TO 2 SPRAYS IN EACH NOSTRIL DAILY   furosemide  20 MG tablet Commonly known as: LASIX  Take 1 tablet (20 mg total) by mouth daily as needed.   glucose blood test strip Commonly known as: Contour Next Test Test blood sugars four times daily   ipratropium 0.06 % nasal spray Commonly known as: ATROVENT 2 sprays into each nostril Three (3) times a day.   levocetirizine 5 MG tablet Commonly known as: XYZAL  Take 1 tablet (5 mg total) by mouth every evening.   losartan  50 MG tablet Commonly known as: COZAAR  Take 1 tablet (50 mg total) by mouth daily.   meloxicam  15 MG tablet Commonly known as: MOBIC  Take 1 tablet (15 mg total) by mouth daily.   metoprolol succinate 25 MG 24 hr tablet Commonly known as: TOPROL-XL Take 25 mg by mouth 2 (two) times daily.   montelukast  10 MG tablet Commonly known as: SINGULAIR  TAKE ONE TABLET DAILY AT BEDTIME   OneTouch Delica Plus Lancet33G Misc   Ozempic  (1 MG/DOSE) 4 MG/3ML Sopn Generic drug: Semaglutide  (1 MG/DOSE) INJECT 1MG  AS DIRECTED ONCE A WEEK   pravastatin  80 MG tablet Commonly known as: PRAVACHOL  Take 1 tablet (80 mg total) by mouth daily.   Spacer/Aero-Holding [employer] Use with inhaler daily   SYRINGE-NEEDLE (DISP) 3 ML 21G X 1-1/2" 3 ML Misc Use to inject testosterone  every week   tadalafil  10 MG tablet Commonly known as: CIALIS  TAKE 1 TABLET BY MOUTH EVERY OTHER DAY AS NEEDED   tamsulosin  0.4 MG Caps capsule Commonly known as: FLOMAX  TAKE 1 CAPSULE DAILY AFTER SUPPER   testosterone  cypionate 200 MG/ML injection Commonly known as: DEPOTESTOSTERONE CYPIONATE INJECT 100MG  (0.5ML) EVERY 10 DAYS (FOR INTRAMUSCULAR USE ONLY)   traZODone  50 MG tablet Commonly known as: DESYREL  Take 0.5-1 tablets (25-50 mg total) by mouth at bedtime as needed for sleep.         Objective:   BP 133/77   Pulse 76   Ht 5\' 8"  (1.727 m)    Wt 184 lb (83.5 kg)   SpO2 97%   BMI 27.98 kg/m   Wt Readings from Last 3 Encounters:  05/25/23 184 lb (83.5 kg)  04/22/23 179 lb (81.2 kg)  04/15/23 181 lb 6.4 oz (82.3 kg)    Physical Exam Vitals and nursing note reviewed.  Constitutional:      General: He is not in acute distress.    Appearance: He is well-developed. He is not diaphoretic.  Eyes:     General: No scleral icterus.    Conjunctiva/sclera: Conjunctivae normal.  Neck:     Thyroid : No thyromegaly.  Cardiovascular:     Rate and Rhythm: Normal rate and regular  rhythm.     Heart sounds: Normal heart sounds. No murmur heard. Pulmonary:     Effort: Pulmonary effort is normal. No respiratory distress.     Breath sounds: Normal breath sounds. No wheezing.  Musculoskeletal:        General: No swelling. Normal range of motion.     Cervical back: Neck supple.  Lymphadenopathy:     Cervical: No cervical adenopathy.  Skin:    General: Skin is warm and dry.     Findings: No rash.  Neurological:     Mental Status: He is alert and oriented to person, place, and time.     Coordination: Coordination normal.  Psychiatric:        Behavior: Behavior normal.     Laceration is well-healed, no erythema  Assessment & Plan:   Problem List Items Addressed This Visit       Musculoskeletal and Integument   Muscular dystrophy (HCC) - Primary   Relevant Orders   Ambulatory referral to Neurology     Other   Laceration of left thumb without foreign body without damage to nail    Will refer to neurology for his muscular issues.  Still sees pulmonology for the lung issues. Follow up plan: Return in about 2 months (around 07/25/2023), or if symptoms worsen or fail to improve, for Diabetes and hypertension and cholesterol..  Counseling provided for all of the vaccine components Orders Placed This Encounter  Procedures   Ambulatory referral to Neurology    Jolyne Needs, MD [facility] 05/25/2023,  3:42 PM

## 2023-05-27 ENCOUNTER — Other Ambulatory Visit: Payer: Self-pay | Admitting: Family Medicine

## 2023-06-07 DIAGNOSIS — E119 Type 2 diabetes mellitus without complications: Secondary | ICD-10-CM | POA: Diagnosis not present

## 2023-06-10 ENCOUNTER — Encounter: Payer: Self-pay | Admitting: Gastroenterology

## 2023-06-16 ENCOUNTER — Encounter

## 2023-06-19 DIAGNOSIS — Z95818 Presence of other cardiac implants and grafts: Secondary | ICD-10-CM | POA: Diagnosis not present

## 2023-06-22 DIAGNOSIS — I4819 Other persistent atrial fibrillation: Secondary | ICD-10-CM | POA: Diagnosis not present

## 2023-06-24 ENCOUNTER — Telehealth: Payer: Self-pay | Admitting: Family Medicine

## 2023-06-27 ENCOUNTER — Other Ambulatory Visit: Payer: Self-pay | Admitting: Family Medicine

## 2023-06-29 ENCOUNTER — Other Ambulatory Visit: Payer: Self-pay | Admitting: Nurse Practitioner

## 2023-07-01 ENCOUNTER — Encounter: Admitting: Gastroenterology

## 2023-07-02 ENCOUNTER — Other Ambulatory Visit: Payer: Self-pay | Admitting: Family Medicine

## 2023-07-02 DIAGNOSIS — G4709 Other insomnia: Secondary | ICD-10-CM

## 2023-07-07 ENCOUNTER — Telehealth: Payer: Self-pay | Admitting: Family Medicine

## 2023-07-07 ENCOUNTER — Telehealth: Payer: Self-pay

## 2023-07-07 DIAGNOSIS — E119 Type 2 diabetes mellitus without complications: Secondary | ICD-10-CM | POA: Diagnosis not present

## 2023-07-07 NOTE — Telephone Encounter (Signed)
 Copied from CRM [id number]. Topic: Clinical - Request for Lab/Test Order >> Jul 07, 2023  1:22 PM Everette C wrote: Reason for CRM: The patient has called to request lab orders for an A1C check as well as other blood work from additional orders from Dr. Lenis that need to be completed at the practice when they come for their A1C check  Please contact the patient further if/when possible to discuss the additional testing needed

## 2023-07-07 NOTE — Telephone Encounter (Signed)
 LMTCB

## 2023-07-07 NOTE — Telephone Encounter (Unsigned)
 Copied from CRM [id number]. Topic: Clinical - Request for Lab/Test Order >> Jul 07, 2023  1:22 PM Everette C wrote: Reason for CRM: The patient has called to request lab orders for an A1C check as well as other blood work from additional orders from Dr. Lenis that need to be completed at the practice when they come for their A1C check  Please contact the patient further if/when possible to discuss the additional testing needed >> Jul 07, 2023  3:16 PM Emylou G wrote: Pls call patient back.. he returned our call?  Unsure of message to relay.. He seemed a bit confused too

## 2023-07-08 NOTE — Telephone Encounter (Signed)
 Duplicate message

## 2023-07-08 NOTE — Telephone Encounter (Signed)
 LMTCB

## 2023-07-09 ENCOUNTER — Other Ambulatory Visit

## 2023-07-09 ENCOUNTER — Other Ambulatory Visit: Payer: Self-pay

## 2023-07-09 ENCOUNTER — Telehealth: Payer: Self-pay

## 2023-07-09 DIAGNOSIS — E785 Hyperlipidemia, unspecified: Secondary | ICD-10-CM

## 2023-07-09 DIAGNOSIS — I10 Essential (primary) hypertension: Secondary | ICD-10-CM

## 2023-07-09 DIAGNOSIS — E119 Type 2 diabetes mellitus without complications: Secondary | ICD-10-CM

## 2023-07-09 LAB — LIPID PANEL

## 2023-07-09 LAB — BAYER DCA HB A1C WAIVED: HB A1C (BAYER DCA - WAIVED): 4.8 % (ref 4.8–5.6)

## 2023-07-09 NOTE — Telephone Encounter (Signed)
 Duplicate. Message sent to pcp and waiting to see if it is okay to place orders. LS

## 2023-07-09 NOTE — Telephone Encounter (Signed)
 R/C on A1C orders from [facility].

## 2023-07-09 NOTE — Telephone Encounter (Signed)
 Copied from CRM [id number]. Topic: Clinical - Request for Lab/Test Order >> Jul 07, 2023  1:22 PM Everette C wrote: Reason for CRM: The patient has called to request lab orders for an A1C check as well as other blood work from additional orders from Dr. Lenis that need to be completed at the practice when they come for their A1C check  Please contact the patient further if/when possible to discuss the additional testing needed >> Jul 09, 2023 10:47 AM Ivette P wrote: Pt called in due to a missed call from office. Called [facility] and spoke to Miitzy, advised orders are there fro Dr. Lenis, seems as if there is a confusion where pt was advised he needed to check A1C, nurse were busy. Pt will wait for phone call back to check A1C work order.  >> Jul 07, 2023  3:16 PM Emylou G wrote: Pls call patient back.. he returned our call?  Unsure of message to relay.. He seemed a bit confused too

## 2023-07-09 NOTE — Telephone Encounter (Signed)
 This has already been taking care of by [facility].

## 2023-07-10 LAB — CBC WITH DIFFERENTIAL/PLATELET
Basophils Absolute: 0 x10E3/uL (ref 0.0–0.2)
Basos: 1 %
EOS (ABSOLUTE): 0.6 x10E3/uL — ABNORMAL HIGH (ref 0.0–0.4)
Eos: 9 %
Hematocrit: 38.6 % (ref 37.5–51.0)
Hemoglobin: 12.6 g/dL — ABNORMAL LOW (ref 13.0–17.7)
Immature Grans (Abs): 0 x10E3/uL (ref 0.0–0.1)
Immature Granulocytes: 0 %
Lymphocytes Absolute: 1.4 x10E3/uL (ref 0.7–3.1)
Lymphs: 19 %
MCH: 31.6 pg (ref 26.6–33.0)
MCHC: 32.6 g/dL (ref 31.5–35.7)
MCV: 97 fL (ref 79–97)
Monocytes Absolute: 0.6 x10E3/uL (ref 0.1–0.9)
Monocytes: 8 %
Neutrophils Absolute: 4.5 x10E3/uL (ref 1.4–7.0)
Neutrophils: 63 %
Platelets: 253 x10E3/uL (ref 150–450)
RBC: 3.99 x10E6/uL — ABNORMAL LOW (ref 4.14–5.80)
RDW: 12.1 % (ref 11.6–15.4)
WBC: 7.1 x10E3/uL (ref 3.4–10.8)

## 2023-07-10 LAB — CMP14+EGFR
ALT: 39 IU/L (ref 0–44)
AST: 44 IU/L — ABNORMAL HIGH (ref 0–40)
Albumin: 4.5 g/dL (ref 3.9–4.9)
Alkaline Phosphatase: 150 IU/L — ABNORMAL HIGH (ref 44–121)
BUN/Creatinine Ratio: 16 (ref 10–24)
BUN: 23 mg/dL (ref 8–27)
Bilirubin Total: 0.4 mg/dL (ref 0.0–1.2)
CO2: 21 mmol/L (ref 20–29)
Calcium: 9.7 mg/dL (ref 8.6–10.2)
Chloride: 105 mmol/L (ref 96–106)
Creatinine, Ser: 1.4 mg/dL — ABNORMAL HIGH (ref 0.76–1.27)
Globulin, Total: 2.6 g/dL (ref 1.5–4.5)
Glucose: 72 mg/dL (ref 70–99)
Potassium: 5.2 mmol/L (ref 3.5–5.2)
Sodium: 141 mmol/L (ref 134–144)
Total Protein: 7.1 g/dL (ref 6.0–8.5)
eGFR: 56 mL/min/1.73 — ABNORMAL LOW (ref >59)

## 2023-07-10 LAB — LIPID PANEL
Chol/HDL Ratio: 2.6 ratio (ref 0.0–5.0)
Cholesterol, Total: 179 mg/dL (ref 100–199)
HDL: 69 mg/dL (ref >39)
LDL Chol Calc (NIH): 88 mg/dL (ref 0–99)
Triglycerides: 126 mg/dL (ref 0–149)
VLDL Cholesterol Cal: 22 mg/dL (ref 5–40)

## 2023-07-13 ENCOUNTER — Ambulatory Visit: Payer: Self-pay | Admitting: Family Medicine

## 2023-07-13 DIAGNOSIS — E860 Dehydration: Secondary | ICD-10-CM

## 2023-07-14 ENCOUNTER — Encounter: Payer: Self-pay | Admitting: "Endocrinology

## 2023-07-14 ENCOUNTER — Ambulatory Visit: Admitting: "Endocrinology

## 2023-07-14 DIAGNOSIS — E291 Testicular hypofunction: Secondary | ICD-10-CM | POA: Diagnosis not present

## 2023-07-15 ENCOUNTER — Encounter: Payer: Self-pay | Admitting: Gastroenterology

## 2023-07-15 ENCOUNTER — Ambulatory Visit (AMBULATORY_SURGERY_CENTER)

## 2023-07-15 VITALS — Ht 68.0 in | Wt 178.0 lb

## 2023-07-15 DIAGNOSIS — Z860100 Personal history of colon polyps, unspecified: Secondary | ICD-10-CM

## 2023-07-15 LAB — TESTOSTERONE, FREE, TOTAL, SHBG
Sex Hormone Binding: 71 nmol/L (ref 19.3–76.4)
Testosterone, Free: 4.7 pg/mL — ABNORMAL LOW (ref 6.6–18.1)
Testosterone: 398 ng/dL (ref 264–916)

## 2023-07-15 LAB — PSA: Prostate Specific Ag, Serum: 0.7 ng/mL (ref 0.0–4.0)

## 2023-07-15 MED ORDER — PEG 3350-KCL-NA BICARB-NACL 420 G PO SOLR: 4000.0000 mL | Freq: Once | ORAL | 0 refills | Status: AC

## 2023-07-15 NOTE — Progress Notes (Signed)
 No egg or soy allergy known to patient  No issues known to pt with past sedation with any surgeries or procedures Patient denies ever being told they had issues or difficulty with intubation  No FH of Malignant Hyperthermia Pt is not on diet pills Pt is not on  home 02  Pt is is on blood thinners; Eliquis 2 day hold  Pt denies issues with constipation  Hx AIB s/p ablation x 2  Have any cardiac testing pending-- no  LOA: independent  Prep: golytely   Patient's chart reviewed by Norleen Schillings CNRA prior to previsit and patient appropriate for the [facility].  Previsit completed and red dot placed by patient's name on their procedure day (on provider's schedule).     PV completed with patient. Prep instructions sent via mychart and home address.

## 2023-07-15 NOTE — Patient Instructions (Addendum)
 HOLD ELIQUIS (BLOOD THINNER) - 2 DAY BEFORE YOUR PROCEDURE LAST DOSE TO BE TAKEN ON FRIDAY 07/31/23 AND THEN HOLD UNTIL AFTER YOUR PROCEDURE    If you use chewing tobacco or snuff, please DO NOT use after midnight the day of your procedure. If you do, your procedure will be cancelled or delayed!!

## 2023-07-16 ENCOUNTER — Other Ambulatory Visit

## 2023-07-16 DIAGNOSIS — E860 Dehydration: Secondary | ICD-10-CM | POA: Diagnosis not present

## 2023-07-17 LAB — CMP14+EGFR
ALT: 26 IU/L (ref 0–44)
AST: 28 IU/L (ref 0–40)
Albumin: 4.3 g/dL (ref 3.9–4.9)
Alkaline Phosphatase: 150 IU/L — ABNORMAL HIGH (ref 44–121)
BUN/Creatinine Ratio: 16 (ref 10–24)
BUN: 21 mg/dL (ref 8–27)
Bilirubin Total: 0.3 mg/dL (ref 0.0–1.2)
CO2: 23 mmol/L (ref 20–29)
Calcium: 9.3 mg/dL (ref 8.6–10.2)
Chloride: 103 mmol/L (ref 96–106)
Creatinine, Ser: 1.29 mg/dL — ABNORMAL HIGH (ref 0.76–1.27)
Globulin, Total: 2.4 g/dL (ref 1.5–4.5)
Glucose: 89 mg/dL (ref 70–99)
Potassium: 5.1 mmol/L (ref 3.5–5.2)
Sodium: 139 mmol/L (ref 134–144)
Total Protein: 6.7 g/dL (ref 6.0–8.5)
eGFR: 62 mL/min/1.73 (ref >59)

## 2023-07-20 ENCOUNTER — Ambulatory Visit: Admitting: "Endocrinology

## 2023-07-20 ENCOUNTER — Encounter: Payer: Self-pay | Admitting: "Endocrinology

## 2023-07-20 VITALS — BP 124/70 | HR 64 | Ht 68.0 in | Wt 181.6 lb

## 2023-07-20 DIAGNOSIS — E119 Type 2 diabetes mellitus without complications: Secondary | ICD-10-CM

## 2023-07-20 DIAGNOSIS — E291 Testicular hypofunction: Secondary | ICD-10-CM

## 2023-07-20 DIAGNOSIS — E782 Mixed hyperlipidemia: Secondary | ICD-10-CM | POA: Diagnosis not present

## 2023-07-20 DIAGNOSIS — Z95818 Presence of other cardiac implants and grafts: Secondary | ICD-10-CM | POA: Diagnosis not present

## 2023-07-20 NOTE — Progress Notes (Signed)
 07/20/2023     Endocrinology follow-up note   Johnny Mathews, 63 y.o., male   Chief Complaint  Patient presents with   Follow-up    Hypogonadism, male     Past Medical History:  Diagnosis Date   Allergy    Alpha galactosidase deficiency    Anemia    past hx    Arthritis    Asthma    Blood transfusion without reported diagnosis    BPH (benign prostatic hypertrophy)    Cataract    removed left eye    Complication of anesthesia    Diabetes mellitus without complication (HCC)    Dysrhythmia    GERD (gastroesophageal reflux disease)    Heart murmur    History of kidney stones    HOH (hard of hearing)    Hyperlipidemia    Hypertension    Irregular heart beat    Muscular dystrophy (HCC)    PONV (postoperative nausea and vomiting)    Sleep apnea    Wears hearing aid in both ears    Past Surgical History:  Procedure Laterality Date   CARDIAC ELECTROPHYSIOLOGY STUDY AND ABLATION  11/2022   CATARACT EXTRACTION Left    COLONOSCOPY     ESOPHAGOGASTRODUODENOSCOPY N/A 08/27/2020   Procedure: ESOPHAGOGASTRODUODENOSCOPY (EGD);  Surgeon: Shyrl Linnie KIDD, MD;  Location: [facility] OR;  Service: Thoracic;  Laterality: N/A;   FEMUR FRACTURE SURGERY     INGUINAL HERNIA REPAIR Right 08/07/2021   Procedure: HERNIA REPAIR INGUINAL ADULT WITH MESH;  Surgeon: Kallie Manuelita BROCKS, MD;  Location: [facility];  Service: General;  Laterality: Right;   INSERTION OF MESH N/A 08/27/2020   Procedure: INSERTION OF ACELL 7.5 x 6cm GENTRIX SURGICAL MATRIX HIATAL MESH;  Surgeon: Shyrl Linnie KIDD, MD;  Location: [facility] OR;  Service: Thoracic;  Laterality: N/A;   KIDNEY STONE SURGERY     x6   KNEE SURGERY Right    x2   MASS EXCISION Right 08/02/2018   Procedure: EXCISION RIGHT LONG FINGER MASS, DEBRIDEMENT PROXIMAL INTERPHALANGEAL JOINT WITH ROTATION FLAP;  Surgeon: Murrell Drivers, MD;  Location: [facility];  Service: Orthopedics;  Laterality: Right;    NISSEN FUNDOPLICATION     [facility]    SHOULDER OPEN ROTATOR CUFF REPAIR     XI ROBOTIC ASSISTED HIATAL HERNIA REPAIR N/A 08/27/2020   Procedure: XI ROBOTIC ASSISTED REDO HIATAL HERNIA REPAIR;  Surgeon: Shyrl Linnie KIDD, MD;  Location: [facility] OR;  Service: Thoracic;  Laterality: N/A;   Social History   Socioeconomic History   Marital status: Married    Spouse name: Not on file   Number of children: Not on file   Years of education: Not on file   Highest education level: Not on file  Occupational History   Occupation: Designer, industrial/product  Tobacco Use   Smoking status: Former    Current packs/day: 0.00    Average packs/day: 1 pack/day for 10.0 years (10.0 ttl pk-yrs)    Types: Cigarettes    Start date: 01/07/1984    Quit date: 01/06/1994    Years since quitting: 29.5   Smokeless tobacco: Current    Types: Snuff  Vaping Use   Vaping status: Never Used  Substance and Sexual Activity   Alcohol use: Yes    Alcohol/week: 2.0 - 3.0 standard drinks of alcohol    Types: 2 - 3 Standard drinks or equivalent per week    Comment: social   Drug use: No   Sexual activity: Not on file  Other  Topics Concern   Not on file  Social History Narrative   Not on file   Social Drivers of Health   Financial Resource Strain: Medium Risk (06/21/2023)   Received from [facility]   Overall Financial Resource Strain (CARDIA)    How hard is it for you to pay for the very basics like food, housing, medical care, and heating?: Somewhat hard  Food Insecurity: No Food Insecurity (06/21/2023)   Received from [facility]   Hunger Vital Sign    Within the past 12 months, you worried that your food would run out before you got the money to buy more.: Never true    Within the past 12 months, the food you bought just didn't last and you didn't have money to get more.: Never true  Transportation Needs: No Transportation Needs (06/21/2023)   Received from [facility]   PRAPARE - Transportation    Lack  of Transportation (Medical): No    Lack of Transportation (Non-Medical): No  Physical Activity: Not on file  Stress: Not on file  Social Connections: Not on file   Outpatient Encounter Medications as of 07/20/2023  Medication Sig   albuterol  (PROVENTIL ) (2.5 MG/3ML) 0.083% nebulizer solution NEBULIZE 1 VIAL EVERY 6 HOURS AS NEEDED FOR WHEEZING & SHORTNESS OF BREATH   albuterol  (VENTOLIN  HFA) 108 (90 Base) MCG/ACT inhaler INHALE 2 PUFFS EVERY 4 HOURS AS NEEDED   amitriptyline  (ELAVIL ) 100 MG tablet TAKE 2 TABLETS BY MOUTH AT BEDTIME   azelastine  (ASTELIN ) 0.1 % nasal spray USE 1 SPRAY IN EACH NOSTRIL TWICE DAILY AS NEEDED   blood glucose meter kit and supplies Dispense based on patient and insurance preference. Use up to four times daily as directed. (FOR ICD-10 E10.9, E11.9). Pt states needs One Touch Verio Meter and one touch delica plus test strips   Blood Glucose Monitoring Suppl (CONTOUR BLOOD GLUCOSE SYSTEM) w/Device KIT Test blood sugars four times daily   budesonide  (PULMICORT ) 0.25 MG/2ML nebulizer solution One vial twice daily with albuterol  (Patient taking differently: Take 0.25 mg by nebulization as needed. One vial twice daily with albuterol )   budesonide -formoterol  (SYMBICORT ) 80-4.5 MCG/ACT inhaler Take 2 puffs first thing in am and then another 2 puffs about 12 hours later.   budesonide -glycopyrrolate -formoterol  (BREZTRI  AEROSPHERE) 160-9-4.8 MCG/ACT AERO inhaler Inhale into the lungs.   cyclobenzaprine  (FLEXERIL ) 10 MG tablet Take 1 tablet (10 mg total) by mouth 3 (three) times daily as needed for muscle spasms. Take 1 tablet by mouth 3 times daily as needed for muscle spasm. Warning: May cause drowsiness.   ELIQUIS 5 MG TABS tablet Take 5 mg by mouth 2 (two) times daily.   EPINEPHRINE  0.3 mg/0.3 mL IJ SOAJ injection INJECT 0.3ML (0.3MG ) IM ONCE   esomeprazole  (NEXIUM ) 40 MG capsule Take 1 capsule (40 mg total) by mouth daily at 12 noon. Take 30-60 min before first meal of the day    famotidine  (PEPCID ) 20 MG tablet Take 1 tablet (20 mg total) by mouth 2 (two) times daily.   ferrous sulfate  325 (65 FE) MG tablet Take 325 mg by mouth daily with breakfast. (Patient not taking: Reported on 07/15/2023)   finasteride  (PROSCAR ) 5 MG tablet TAKE ONE (1) TABLET EACH DAY   fluticasone  (FLONASE ) 50 MCG/ACT nasal spray USE 1 TO 2 SPRAYS IN EACH NOSTRIL DAILY   furosemide  (LASIX ) 20 MG tablet Take 1 tablet (20 mg total) by mouth daily as needed. (Patient taking differently: Take 20 mg by mouth daily.)  glucose blood (CONTOUR NEXT TEST) test strip Test blood sugars four times daily   ipratropium (ATROVENT) 0.06 % nasal spray 2 sprays into each nostril Three (3) times a day.   Lancets (ONETOUCH DELICA PLUS LANCET33G) MISC    levocetirizine (XYZAL ) 5 MG tablet Take 1 tablet (5 mg total) by mouth every evening.   losartan  (COZAAR ) 50 MG tablet Take 1 tablet (50 mg total) by mouth daily.   meloxicam  (MOBIC ) 15 MG tablet Take 1 tablet (15 mg total) by mouth daily.   metoprolol succinate (TOPROL-XL) 25 MG 24 hr tablet Take 25 mg by mouth 2 (two) times daily.   montelukast  (SINGULAIR ) 10 MG tablet TAKE ONE TABLET DAILY AT BEDTIME   OZEMPIC , 1 MG/DOSE, 4 MG/3ML SOPN INJECT 1MG  AS DIRECTED ONCE A WEEK   pravastatin  (PRAVACHOL ) 80 MG tablet Take 1 tablet (80 mg total) by mouth daily.   Spacer/Aero-Holding Raguel FRENCH Use with inhaler daily   SYRINGE-NEEDLE, DISP, 3 ML 21G X 1-1/2 3 ML MISC Use to inject testosterone  every week   tadalafil  (CIALIS ) 10 MG tablet TAKE 1 TABLET BY MOUTH EVERY OTHER DAY AS NEEDED   tamsulosin  (FLOMAX ) 0.4 MG CAPS capsule TAKE 1 CAPSULE DAILY AFTER SUPPER   testosterone  cypionate (DEPOTESTOSTERONE CYPIONATE) 200 MG/ML injection INJECT 100MG  (0.5ML) EVERY 10 DAYS (FOR INTRAMUSCULAR USE ONLY)   traZODone  (DESYREL ) 50 MG tablet Take 0.5-1 tablets (25-50 mg total) by mouth at bedtime as needed for sleep. (Patient not taking: Reported on 07/15/2023)   No  facility-administered encounter medications on file as of 07/20/2023.   ALLERGIES: Allergies  Allergen Reactions   Egg-Derived Products Swelling    Throat- 03-31-2019 pt states can eat cakes and Pie with no issues  Throat- 03-31-2019 pt states can eat cakes and Pie with no issues    Septra [Sulfamethoxazole-Trimethoprim] Swelling    throat   Sulfonamide Derivatives Hives   Latex Rash    VACCINATION STATUS: Immunization History  Administered Date(s) Administered   Influenza Inj Mdck Quad Pf 09/24/2018, 10/22/2020   Influenza, Quadrivalent, Recombinant, Inj, Pf 01/15/2017   Influenza, Seasonal, Injecte, Preservative Fre 12/11/2022   Influenza-Unspecified 09/24/2018, 10/31/2019   Moderna SARS-COV2 Booster Vaccination 04/26/2020   Moderna Sars-Covid-2 Vaccination 01/15/2019, 02/12/2019, 09/07/2019   Pneumococcal Polysaccharide-23 11/09/2019   Tdap 07/20/2009, 11/09/2019   Zoster Recombinant(Shingrix) 08/22/2020, 02/22/2021    HPI: Johnny Mathews is a 63 y.o.-year-old man.  He is returning for a follow-up after he was seen in consultation for hypogonadism.  He does not have new complaints today.  His previsit labs show total testosterone  398, currently on testosterone  100 mg IM every 10 days.    He notes from previous visits. He reports that he has hypogonadism for as long as he remembers, does not father any biological children although he adopted 3 kids.  He reports to have been told that he cannot father any children due to low sperm count.  He reports consistency with injection of his testosterone  every other week.    He denies  trauma to testes,  chemotherapy,  testicular irradiation,  nor genitourinary surgery. Denies new complaints since last visit.  He wishes to be continued on testosterone  treatment.   -His recent labs show normal CBC, PSA   -  He has asthma on various inhalers. No chronic pain. Not on opiates, does not take steroids.    He does not have family  Or  personal history of premature  cardiac disease.  ROS: Limited as above.  PE: BP  124/70   Pulse 64   Ht 5' 8 (1.727 m)   Wt 181 lb 9.6 oz (82.4 kg)   BMI 27.61 kg/m  Wt Readings from Last 3 Encounters:  07/20/23 181 lb 9.6 oz (82.4 kg)  07/15/23 178 lb (80.7 kg)  05/25/23 184 lb (83.5 kg)    Genital exam: normal male escutcheon, no inguinal LAD, normal phallus, significantly shrunk testes bilaterally to 5 mL, no testicular or scrotal mass lesions.  no penile discharge.  No gynecomastia.   CMP ( most recent) CMP     Component Value Date/Time   NA 139 07/16/2023 1249   K 5.1 07/16/2023 1249   CL 103 07/16/2023 1249   CO2 23 07/16/2023 1249   GLUCOSE 89 07/16/2023 1249   GLUCOSE 78 11/10/2022 1201   BUN 21 07/16/2023 1249   CREATININE 1.29 (H) 07/16/2023 1249   CALCIUM 9.3 07/16/2023 1249   PROT 6.7 07/16/2023 1249   ALBUMIN 4.3 07/16/2023 1249   AST 28 07/16/2023 1249   ALT 26 07/16/2023 1249   ALKPHOS 150 (H) 07/16/2023 1249   BILITOT 0.3 07/16/2023 1249   GFRNONAA >60 11/10/2022 1201   GFRAA 74 02/23/2020 0929     Diabetic Labs (most recent): Lab Results  Component Value Date   HGBA1C 4.8 07/09/2023   HGBA1C 4.8 03/05/2023   HGBA1C 5.2 03/13/2022     Lipid Panel ( most recent) Lipid Panel     Component Value Date/Time   CHOL 179 07/09/2023 1147   TRIG 126 07/09/2023 1147   HDL 69 07/09/2023 1147   CHOLHDL 2.6 07/09/2023 1147   CHOLHDL 4.8 03/30/2009 1807   VLDL 20 03/30/2009 1807   LDLCALC 88 07/09/2023 1147    Recent Results (from the past 2160 hours)  Bayer DCA Hb A1c Waived     Status: None   Collection Time: 07/09/23 11:45 AM  Result Value Ref Range   HB A1C (BAYER DCA - WAIVED) 4.8 4.8 - 5.6 %    Comment:          Prediabetes: 5.7 - 6.4          Diabetes: >6.4          Glycemic control for adults with diabetes: <7.0   Lipid panel     Status: None   Collection Time: 07/09/23 11:47 AM  Result Value Ref Range   Cholesterol, Total 179 100  - 199 mg/dL   Triglycerides 873 0 - 149 mg/dL   HDL 69 >60 mg/dL   VLDL Cholesterol Cal 22 5 - 40 mg/dL   LDL Chol Calc (NIH) 88 0 - 99 mg/dL   Chol/HDL Ratio 2.6 0.0 - 5.0 ratio    Comment:                                   T. Chol/HDL Ratio                                             Men  Women                               1/2 Avg.Risk  3.4    3.3  Avg.Risk  5.0    4.4                                2X Avg.Risk  9.6    7.1                                3X Avg.Risk 23.4   11.0   CMP14+EGFR     Status: Abnormal   Collection Time: 07/09/23 11:47 AM  Result Value Ref Range   Glucose 72 70 - 99 mg/dL   BUN 23 8 - 27 mg/dL   Creatinine, Ser 8.59 (H) 0.76 - 1.27 mg/dL   eGFR 56 (L) >40 fO/fpw/8.26   BUN/Creatinine Ratio 16 10 - 24   Sodium 141 134 - 144 mmol/L   Potassium 5.2 3.5 - 5.2 mmol/L   Chloride 105 96 - 106 mmol/L   CO2 21 20 - 29 mmol/L   Calcium 9.7 8.6 - 10.2 mg/dL   Total Protein 7.1 6.0 - 8.5 g/dL   Albumin 4.5 3.9 - 4.9 g/dL   Globulin, Total 2.6 1.5 - 4.5 g/dL   Bilirubin Total 0.4 0.0 - 1.2 mg/dL   Alkaline Phosphatase 150 (H) 44 - 121 IU/L   AST 44 (H) 0 - 40 IU/L   ALT 39 0 - 44 IU/L  CBC with Differential/Platelet     Status: Abnormal   Collection Time: 07/09/23 11:47 AM  Result Value Ref Range   WBC 7.1 3.4 - 10.8 x10E3/uL   RBC 3.99 (L) 4.14 - 5.80 x10E6/uL   Hemoglobin 12.6 (L) 13.0 - 17.7 g/dL   Hematocrit 61.3 62.4 - 51.0 %   MCV 97 79 - 97 fL   MCH 31.6 26.6 - 33.0 pg   MCHC 32.6 31.5 - 35.7 g/dL   RDW 87.8 88.3 - 84.5 %   Platelets 253 150 - 450 x10E3/uL   Neutrophils 63 Not Estab. %   Lymphs 19 Not Estab. %   Monocytes 8 Not Estab. %   Eos 9 Not Estab. %   Basos 1 Not Estab. %   Neutrophils Absolute 4.5 1.4 - 7.0 x10E3/uL   Lymphocytes Absolute 1.4 0.7 - 3.1 x10E3/uL   Monocytes Absolute 0.6 0.1 - 0.9 x10E3/uL   EOS (ABSOLUTE) 0.6 (H) 0.0 - 0.4 x10E3/uL   Basophils Absolute 0.0 0.0 - 0.2 x10E3/uL    Immature Granulocytes 0 Not Estab. %   Immature Grans (Abs) 0.0 0.0 - 0.1 x10E3/uL  Testosterone , Free, Total, SHBG     Status: Abnormal   Collection Time: 07/14/23 11:44 AM  Result Value Ref Range   Testosterone  398 264 - 916 ng/dL    Comment: Adult male reference interval is based on a population of healthy nonobese males (BMI <30) between 78 and 70 years old. Travison, et.al. JCEM [id number]. PMID: 71675896.    Testosterone , Free 4.7 (L) 6.6 - 18.1 pg/mL   Sex Hormone Binding 71.0 19.3 - 76.4 nmol/L  PSA     Status: None   Collection Time: 07/14/23 11:44 AM  Result Value Ref Range   Prostate Specific Ag, Serum 0.7 0.0 - 4.0 ng/mL    Comment: Roche ECLIA methodology. According to the American Urological Association, Serum PSA should decrease and remain at undetectable levels after radical prostatectomy. The AUA defines biochemical recurrence as an initial PSA value 0.2 ng/mL or greater followed by a subsequent  confirmatory PSA value 0.2 ng/mL or greater. Values obtained with different assay methods or kits cannot be used interchangeably. Results cannot be interpreted as absolute evidence of the presence or absence of malignant disease.   CMP14+EGFR     Status: Abnormal   Collection Time: 07/16/23 12:49 PM  Result Value Ref Range   Glucose 89 70 - 99 mg/dL   BUN 21 8 - 27 mg/dL   Creatinine, Ser 8.70 (H) 0.76 - 1.27 mg/dL   eGFR 62 >40 fO/fpw/8.26   BUN/Creatinine Ratio 16 10 - 24   Sodium 139 134 - 144 mmol/L   Potassium 5.1 3.5 - 5.2 mmol/L   Chloride 103 96 - 106 mmol/L   CO2 23 20 - 29 mmol/L   Calcium 9.3 8.6 - 10.2 mg/dL   Total Protein 6.7 6.0 - 8.5 g/dL   Albumin 4.3 3.9 - 4.9 g/dL   Globulin, Total 2.4 1.5 - 4.5 g/dL   Bilirubin Total 0.3 0.0 - 1.2 mg/dL   Alkaline Phosphatase 150 (H) 44 - 121 IU/L   AST 28 0 - 40 IU/L   ALT 26 0 - 44 IU/L     ASSESSMENT:  1. Hypogonadism-primary testicular hypofunction   PLAN:   See notes from previous  visits.  He returns with acceptable total testosterone  level at 398, progressively improving from 93.  He is advised to continue testosterone   100 mg IM every 10 days with plan to repeat total testosterone  before his next visit in 4 months.  This will give him a total of 300 mg of testosterone  monthly.    -  I discussed adverse effects of unnecessary testosterone  replacement short-term and long-term.  In this patient with bilaterally shrunk testicles, his hypogonadism is likely chronic and primary. I discussed with him the fact that testosterone  replacement will further diminish his chance of fertility.  At this point, he is not interested to keep his fertility.  -Given his medical history of sleep apnea and BPH which are relative contraindications for testosterone  replacement, his testosterone  dose will be titrated slowly based on his clinical response.    -He is already on PDE 5 inhibitors for ED.  He is advised to stay on pravastatin  80 mg p.o. nightly for hyperlipidemia.    Patient does have history of diabetes,  which seems to have reversed.  His A1c was 4.8% in May 2023.  He is now on Ozempic  1 mg subcutaneously weekly.  He is being cared for for by his PMD for his diabetes.   He is encouraged to keep close follow-up with his PMD.   I spent  21  minutes in the care of the patient today including review of labs from Thyroid  Function, CMP, and other relevant labs ; imaging/biopsy records (current and previous including abstractions from other facilities); face-to-face time discussing  his lab results and symptoms, medications doses, his options of short and long term treatment based on the latest standards of care / guidelines;   and documenting the encounter.  Johnny Mathews  participated in the discussions, expressed understanding, and voiced agreement with the above plans.  All questions were answered to his satisfaction. he is encouraged to contact clinic should he have any questions or  concerns prior to his return visit.    Return in about 4 months (around 11/20/2023) for Fasting Labs  in AM B4 8.  Ranny Earl, MD [facility] [address] [place], [place] [address] Phone: [phone]  Fax:  [phone]   07/20/2023, 1:06 PM  This note was partially dictated with voice recognition software. Similar sounding words can be transcribed inadequately or may not  be corrected upon review.

## 2023-07-23 ENCOUNTER — Ambulatory Visit: Payer: Self-pay | Admitting: Family Medicine

## 2023-07-27 ENCOUNTER — Encounter: Payer: Self-pay | Admitting: Family Medicine

## 2023-07-27 ENCOUNTER — Ambulatory Visit: Admitting: Family Medicine

## 2023-07-27 VITALS — BP 116/62 | HR 73 | Ht 68.0 in | Wt 179.0 lb

## 2023-07-27 DIAGNOSIS — Z23 Encounter for immunization: Secondary | ICD-10-CM

## 2023-07-27 DIAGNOSIS — E1159 Type 2 diabetes mellitus with other circulatory complications: Secondary | ICD-10-CM

## 2023-07-27 DIAGNOSIS — M25512 Pain in left shoulder: Secondary | ICD-10-CM

## 2023-07-27 DIAGNOSIS — I152 Hypertension secondary to endocrine disorders: Secondary | ICD-10-CM

## 2023-07-27 DIAGNOSIS — E1169 Type 2 diabetes mellitus with other specified complication: Secondary | ICD-10-CM

## 2023-07-27 DIAGNOSIS — Z7985 Long-term (current) use of injectable non-insulin antidiabetic drugs: Secondary | ICD-10-CM | POA: Diagnosis not present

## 2023-07-27 DIAGNOSIS — N179 Acute kidney failure, unspecified: Secondary | ICD-10-CM | POA: Diagnosis not present

## 2023-07-27 DIAGNOSIS — E785 Hyperlipidemia, unspecified: Secondary | ICD-10-CM

## 2023-07-27 DIAGNOSIS — E119 Type 2 diabetes mellitus without complications: Secondary | ICD-10-CM

## 2023-07-27 NOTE — Progress Notes (Signed)
 BP 116/62   Pulse 73   Ht 5' 8 (1.727 m)   Wt 179 lb (81.2 kg)   SpO2 99%   BMI 27.22 kg/m    Subjective:   Patient ID: Johnny Mathews, male    DOB: 1960/08/03, 63 y.o.   MRN: [id number]  HPI: Johnny Mathews is a 63 y.o. male presenting on 07/27/2023 for Medical Management of Chronic Issues, Diabetes, and Hyperlipidemia   HPI Type 2 diabetes mellitus Patient comes in today for recheck of his diabetes. Patient has been currently taking no medicine, diet control. Patient is currently on an ACE inhibitor/ARB. Patient has seen an ophthalmologist this year. Patient denies any new issues with their feet. The symptom started onset as an adult hypertension and hyperlipidemia and CAD and CHF ARE RELATED TO DM   Hypertension Patient is currently on furosemide  and losartan  and metoprolol, and their blood pressure today is 116/62. Patient denies any lightheadedness or dizziness. Patient denies headaches, blurred vision, chest pains, shortness of breath, or weakness. Denies any side effects from medication and is content with current medication.   Hyperlipidemia Patient is coming in for recheck of his hyperlipidemia. The patient is currently taking pravastatin . They deny any issues with myalgias or history of liver damage from it. They deny any focal numbness or weakness or chest pain.   Patient's other biggest complaint today is that his left shoulder is been bothering him, it mainly hurts when he tries to push on things, when he pulls it is fine or when he moves it around it is fine, it is mainly when he tries to push things it hurts on the posterior aspect of his left shoulder.  Is been bothering him over the past couple months.  Relevant past medical, surgical, family and social history reviewed and updated as indicated. Interim medical history since our last visit reviewed. Allergies and medications reviewed and updated.  Review of Systems  Constitutional:  Negative for chills and fever.   Eyes:  Negative for visual disturbance.  Respiratory:  Negative for shortness of breath and wheezing.   Cardiovascular:  Negative for chest pain and leg swelling.  Musculoskeletal:  Positive for arthralgias. Negative for back pain, gait problem, neck pain and neck stiffness.  Skin:  Negative for rash.  Neurological:  Negative for dizziness and light-headedness.  All other systems reviewed and are negative.   Per HPI unless specifically indicated above   Allergies as of 07/27/2023       Reactions   Egg-derived Products Swelling   Throat- 03-31-2019 pt states can eat cakes and Pie with no issues  Throat- 03-31-2019 pt states can eat cakes and Pie with no issues    Septra [sulfamethoxazole-trimethoprim] Swelling   throat   Sulfonamide Derivatives Hives   Latex Rash        Medication List        Accurate as of July 27, 2023 11:55 AM. If you have any questions, ask your nurse or doctor.          STOP taking these medications    azelastine  0.1 % nasal spray Commonly known as: ASTELIN  Stopped by: [facility] Kassadee Carawan   ipratropium 0.06 % nasal spray Commonly known as: ATROVENT Stopped by: [facility] Myson Levi   levocetirizine 5 MG tablet Commonly known as: XYZAL  Stopped by: [facility] Tyiana Hill   traZODone  50 MG tablet Commonly known as: DESYREL  Stopped by: [facility] Herny Scurlock       TAKE these medications  albuterol  (2.5 MG/3ML) 0.083% nebulizer solution Commonly known as: PROVENTIL  NEBULIZE 1 VIAL EVERY 6 HOURS AS NEEDED FOR WHEEZING & SHORTNESS OF BREATH   albuterol  108 (90 Base) MCG/ACT inhaler Commonly known as: VENTOLIN  HFA INHALE 2 PUFFS EVERY 4 HOURS AS NEEDED   amitriptyline  100 MG tablet Commonly known as: ELAVIL  TAKE 2 TABLETS BY MOUTH AT BEDTIME   blood glucose meter kit and supplies Dispense based on patient and insurance preference. Use up to four times daily as directed. (FOR ICD-10 E10.9, E11.9). Pt states needs One Touch Verio Meter and one  touch delica plus test strips   Breztri  Aerosphere 160-9-4.8 MCG/ACT Aero inhaler Generic drug: budesonide -glycopyrrolate -formoterol  Inhale into the lungs.   budesonide  0.25 MG/2ML nebulizer solution Commonly known as: Pulmicort  One vial twice daily with albuterol  What changed:  how much to take how to take this when to take this reasons to take this   budesonide -formoterol  80-4.5 MCG/ACT inhaler Commonly known as: Symbicort  Take 2 puffs first thing in am and then another 2 puffs about 12 hours later.   Contour Blood Glucose System w/Device Kit Test blood sugars four times daily   cyclobenzaprine  10 MG tablet Commonly known as: FLEXERIL  Take 1 tablet (10 mg total) by mouth 3 (three) times daily as needed for muscle spasms. Take 1 tablet by mouth 3 times daily as needed for muscle spasm. Warning: May cause drowsiness.   Eliquis 5 MG Tabs tablet Generic drug: apixaban Take 5 mg by mouth 2 (two) times daily.   EPINEPHrine  0.3 mg/0.3 mL Soaj injection Commonly known as: EPI-PEN INJECT 0.3ML (0.3MG ) IM ONCE   esomeprazole  40 MG capsule Commonly known as: NexIUM  Take 1 capsule (40 mg total) by mouth daily at 12 noon. Take 30-60 min before first meal of the day   famotidine  20 MG tablet Commonly known as: PEPCID  Take 1 tablet (20 mg total) by mouth 2 (two) times daily.   ferrous sulfate  325 (65 FE) MG tablet Take 325 mg by mouth daily with breakfast.   finasteride  5 MG tablet Commonly known as: PROSCAR  TAKE ONE (1) TABLET EACH DAY   fluticasone  50 MCG/ACT nasal spray Commonly known as: FLONASE  USE 1 TO 2 SPRAYS IN EACH NOSTRIL DAILY   furosemide  20 MG tablet Commonly known as: LASIX  Take 1 tablet (20 mg total) by mouth daily as needed. What changed: when to take this   glucose blood test strip Commonly known as: Contour Next Test Test blood sugars four times daily   losartan  50 MG tablet Commonly known as: COZAAR  Take 1 tablet (50 mg total) by mouth daily.    meloxicam  15 MG tablet Commonly known as: MOBIC  Take 1 tablet (15 mg total) by mouth daily.   metoprolol succinate 25 MG 24 hr tablet Commonly known as: TOPROL-XL Take 25 mg by mouth 2 (two) times daily.   montelukast  10 MG tablet Commonly known as: SINGULAIR  TAKE ONE TABLET DAILY AT BEDTIME   OneTouch Delica Plus Lancet33G Misc   Ozempic  (1 MG/DOSE) 4 MG/3ML Sopn Generic drug: Semaglutide  (1 MG/DOSE) INJECT 1MG  AS DIRECTED ONCE A WEEK   pravastatin  80 MG tablet Commonly known as: PRAVACHOL  Take 1 tablet (80 mg total) by mouth daily.   Spacer/Aero-Holding [employer] Use with inhaler daily   SYRINGE-NEEDLE (DISP) 3 ML 21G X 1-1/2 3 ML Misc Use to inject testosterone  every week   tadalafil  10 MG tablet Commonly known as: CIALIS  TAKE 1 TABLET BY MOUTH EVERY OTHER DAY AS NEEDED   tamsulosin  0.4  MG Caps capsule Commonly known as: FLOMAX  TAKE 1 CAPSULE DAILY AFTER SUPPER   testosterone  cypionate 200 MG/ML injection Commonly known as: DEPOTESTOSTERONE CYPIONATE INJECT 100MG  (0.5ML) EVERY 10 DAYS (FOR INTRAMUSCULAR USE ONLY)         Objective:   BP 116/62   Pulse 73   Ht 5' 8 (1.727 m)   Wt 179 lb (81.2 kg)   SpO2 99%   BMI 27.22 kg/m   Wt Readings from Last 3 Encounters:  07/27/23 179 lb (81.2 kg)  07/20/23 181 lb 9.6 oz (82.4 kg)  07/15/23 178 lb (80.7 kg)    Physical Exam Vitals and nursing note reviewed.  Constitutional:      General: He is not in acute distress.    Appearance: He is well-developed. He is not diaphoretic.  Eyes:     General: No scleral icterus.    Conjunctiva/sclera: Conjunctivae normal.  Neck:     Thyroid : No thyromegaly.  Cardiovascular:     Rate and Rhythm: Normal rate and regular rhythm.     Heart sounds: Normal heart sounds. No murmur heard. Pulmonary:     Effort: Pulmonary effort is normal. No respiratory distress.     Breath sounds: Normal breath sounds. No wheezing.  Musculoskeletal:        General: Normal range  of motion.     Cervical back: Neck supple.  Lymphadenopathy:     Cervical: No cervical adenopathy.  Skin:    General: Skin is warm and dry.     Findings: No rash.  Neurological:     Mental Status: He is alert and oriented to person, place, and time.     Coordination: Coordination normal.  Psychiatric:        Behavior: Behavior normal.     Results for orders placed or performed in visit on 07/16/23  CMP14+EGFR   Collection Time: 07/16/23 12:49 PM  Result Value Ref Range   Glucose 89 70 - 99 mg/dL   BUN 21 8 - 27 mg/dL   Creatinine, Ser 8.70 (H) 0.76 - 1.27 mg/dL   eGFR 62 >40 fO/fpw/8.26   BUN/Creatinine Ratio 16 10 - 24   Sodium 139 134 - 144 mmol/L   Potassium 5.1 3.5 - 5.2 mmol/L   Chloride 103 96 - 106 mmol/L   CO2 23 20 - 29 mmol/L   Calcium 9.3 8.6 - 10.2 mg/dL   Total Protein 6.7 6.0 - 8.5 g/dL   Albumin 4.3 3.9 - 4.9 g/dL   Globulin, Total 2.4 1.5 - 4.5 g/dL   Bilirubin Total 0.3 0.0 - 1.2 mg/dL   Alkaline Phosphatase 150 (H) 44 - 121 IU/L   AST 28 0 - 40 IU/L   ALT 26 0 - 44 IU/L    Assessment & Plan:   Problem List Items Addressed This Visit       Cardiovascular and Mediastinum   Hypertension associated with diabetes (HCC)     Endocrine   Hyperlipidemia associated with type 2 diabetes mellitus (HCC)   Diabetes mellitus, stable (HCC) - Primary   Relevant Orders   BMP8+EGFR   Other Visit Diagnoses       Acute kidney injury (HCC)         Acute pain of left shoulder       Appears to be possibly a tendinitis versus bursitis   Relevant Orders   Ambulatory referral to Sports Medicine     A1c and blood work from a month ago was pretty good except for  the kidney function was up mildly, we will recheck the kidney function today but did improve when we rechecked it it a couple weeks ago.  Placed referral to sports medicine for him because of his shoulder.  Follow up plan: Return in about 3 months (around 10/27/2023), or if symptoms worsen or fail to  improve, for Diabetes recheck.  Counseling provided for all of the vaccine components Orders Placed This Encounter  Procedures   BMP8+EGFR   Ambulatory referral to Sports Medicine    Fonda Levins, MD [facility] 07/27/2023, 11:55 AM

## 2023-07-30 ENCOUNTER — Other Ambulatory Visit: Payer: Self-pay | Admitting: Family Medicine

## 2023-07-30 DIAGNOSIS — G4709 Other insomnia: Secondary | ICD-10-CM

## 2023-08-03 ENCOUNTER — Ambulatory Visit (AMBULATORY_SURGERY_CENTER): Admitting: Gastroenterology

## 2023-08-03 ENCOUNTER — Encounter: Payer: Self-pay | Admitting: Gastroenterology

## 2023-08-03 VITALS — BP 148/81 | HR 64 | Temp 98.3°F | Resp 19 | Ht 68.0 in | Wt 179.0 lb

## 2023-08-03 DIAGNOSIS — Q438 Other specified congenital malformations of intestine: Secondary | ICD-10-CM | POA: Diagnosis not present

## 2023-08-03 DIAGNOSIS — Z1211 Encounter for screening for malignant neoplasm of colon: Secondary | ICD-10-CM | POA: Diagnosis not present

## 2023-08-03 DIAGNOSIS — Z860100 Personal history of colon polyps, unspecified: Secondary | ICD-10-CM

## 2023-08-03 DIAGNOSIS — D120 Benign neoplasm of cecum: Secondary | ICD-10-CM | POA: Diagnosis not present

## 2023-08-03 MED ORDER — SODIUM CHLORIDE 0.9 % IV SOLN: 500.0000 mL | Freq: Once | INTRAVENOUS | Status: DC

## 2023-08-03 NOTE — Op Note (Addendum)
 [name] Patient Name: Johnny Mathews Procedure Date: 08/03/2023 8:38 AM MRN: [id number] Endoscopist: Victory L. Mathews , MD, [id number] Age: 63 Referring MD:  Date of Birth: 1960/08/18 Gender: Male Account #: [id number] Procedure:                Colonoscopy Indications:              Personal history of colonic polyps                           Several TA and SSP (some > 10mm) April 2021                           poor prep with Suflav Apr 2025 Medicines:                Monitored Anesthesia Care Procedure:                Pre-Anesthesia Assessment:                           - Prior to the procedure, a History and Physical                            was performed, and patient medications and                            allergies were reviewed. The patient's tolerance of                            previous anesthesia was also reviewed. The risks                            and benefits of the procedure and the sedation                            options and risks were discussed with the patient.                            All questions were answered, and informed consent                            was obtained. Prior Anticoagulants: The patient has                            taken Eliquis (apixaban), last dose was 6 days                            prior to procedure (patient decided 6 days; 2 day                            hold was instructed). ASA Grade Assessment: III - A                            patient with severe systemic disease. After  reviewing the risks and benefits, the patient was                            deemed in satisfactory condition to undergo the                            procedure.                           After obtaining informed consent, the colonoscope                            was passed under direct vision. Throughout the                            procedure, the patient's blood pressure, pulse, and                             oxygen saturations were monitored continuously. The                            CF [id number] #[id number] was introduced through the anus                            and advanced to the the terminal ileum, with                            identification of the appendiceal orifice and IC                            valve. The colonoscopy was somewhat difficult due                            to a redundant colon. Successful completion of the                            procedure was aided by changing the patient to a                            semi-prone position, using manual pressure and                            straightening and shortening the scope to obtain                            bowel loop reduction. The quality of the bowel                            preparation was good. The terminal ileum, ileocecal                            valve, appendiceal orifice, and rectum were  photographed. The bowel preparation used was                            GoLYTELY. Scope In: 8:54:49 AM Scope Out: 9:12:41 AM Scope Withdrawal Time: 0 hours 12 minutes 27 seconds  Total Procedure Duration: 0 hours 17 minutes 52 seconds  Findings:                 The perianal and digital rectal examinations were                            normal.                           The terminal ileum appeared normal.                           Repeat examination of right colon under NBI                            performed.                           A diminutive polyp was found in the cecum. The                            polyp was semi-sessile. The polyp was removed with                            a cold snare. Resection and retrieval were complete.                           The sigmoid colon was redundant.                           The exam was otherwise without abnormality on                            direct and retroflexion views. Complications:            No immediate complications. Estimated Blood  Loss:     Estimated blood loss was minimal. Impression:               - The examined portion of the ileum was normal.                           - One diminutive polyp in the cecum, removed with a                            cold snare. Resected and retrieved.                           - Redundant colon.                           - The examination was otherwise normal on direct  and retroflexion views. Recommendation:           - Patient has a contact number available for                            emergencies. The signs and symptoms of potential                            delayed complications were discussed with the                            patient. Return to normal activities tomorrow.                            Written discharge instructions were provided to the                            patient.                           - Resume previous diet.                           - Continue present medications.                           - Await pathology results.                           - Repeat colonoscopy in 5 years for surveillance.                            (use golytely again for prep on next exam)                           - Resume Eliquis at usual dose tomorrow                           - esume Johnny Cadden L. Legrand, MD 08/03/2023 9:17:48 AM This report has been signed electronically.

## 2023-08-03 NOTE — Patient Instructions (Signed)
 YOU HAD AN ENDOSCOPIC PROCEDURE TODAY AT THE [facility]:   Refer to the procedure report that was given to you for any specific questions about what was found during the examination.  If the procedure report does not answer your questions, please call your gastroenterologist to clarify.  If you requested that your care partner not be given the details of your procedure findings, then the procedure report has been included in a sealed envelope for you to review at your convenience later.  YOU SHOULD EXPECT: Some feelings of bloating in the abdomen. Passage of more gas than usual.  Walking can help get rid of the air that was put into your GI tract during the procedure and reduce the bloating. If you had a lower endoscopy (such as a colonoscopy or flexible sigmoidoscopy) you may notice spotting of blood in your stool or on the toilet paper. If you underwent a bowel prep for your procedure, you may not have a normal bowel movement for a few days.  Please Note:  You might notice some irritation and congestion in your nose or some drainage.  This is from the oxygen used during your procedure.  There is no need for concern and it should clear up in a day or so.  SYMPTOMS TO REPORT IMMEDIATELY:  Following lower endoscopy (colonoscopy or flexible sigmoidoscopy):  Excessive amounts of blood in the stool  Significant tenderness or worsening of abdominal pains  Swelling of the abdomen that is new, acute  Fever of 100F or higher Resume previous diet Continue present medications Await pathology results Resume Eliquis at usual dose tomorrow Handout on polyps given    For urgent or emergent issues, a gastroenterologist can be reached at any hour by calling [phone]. Do not use MyChart messaging for urgent concerns.    DIET:  We do recommend a small meal at first, but then you may proceed to your regular diet.  Drink plenty of fluids but you should avoid alcoholic beverages for 24  hours.  ACTIVITY:  You should plan to take it easy for the rest of today and you should NOT DRIVE or use heavy machinery until tomorrow (because of the sedation medicines used during the test).    FOLLOW UP: Our staff will call the number listed on your records the next business day following your procedure.  We will call around 7:15- 8:00 am to check on you and address any questions or concerns that you may have regarding the information given to you following your procedure. If we do not reach you, we will leave a message.     If any biopsies were taken you will be contacted by phone or by letter within the next 1-3 weeks.  Please call us  at [phone] if you have not heard about the biopsies in 3 weeks.    SIGNATURES/CONFIDENTIALITY: You and/or your care partner have signed paperwork which will be entered into your electronic medical record.  These signatures attest to the fact that that the information above on your After Visit Summary has been reviewed and is understood.  Full responsibility of the confidentiality of this discharge information lies with you and/or your care-partner.

## 2023-08-03 NOTE — Progress Notes (Signed)
 History and Physical:  This patient presents for endoscopic testing for: Encounter Diagnosis  Name Primary?   Hx of colonic polyps Yes    Surveillance colonoscopy today for Hx colon polyps Several TA and SSP (some > 10mm) April 2021 Poor prep (Suflave ) April 2025  Was instructed to hold Eliquis 2 days, but he decided to hold it for 6 days before today's procedure.   Patient is otherwise without complaints or active issues today.   Past Medical History: Past Medical History:  Diagnosis Date   Allergy    Alpha galactosidase deficiency    Anemia    past hx    Arthritis    Asthma    Blood transfusion without reported diagnosis    BPH (benign prostatic hypertrophy)    Cataract    removed left eye    Complication of anesthesia    Diabetes mellitus without complication (HCC)    Dysrhythmia    GERD (gastroesophageal reflux disease)    Heart murmur    History of kidney stones    HOH (hard of hearing)    Hyperlipidemia    Hypertension    Irregular heart beat    Muscular dystrophy (HCC)    PONV (postoperative nausea and vomiting)    Sleep apnea    Wears hearing aid in both ears      Past Surgical History: Past Surgical History:  Procedure Laterality Date   CARDIAC ELECTROPHYSIOLOGY STUDY AND ABLATION  11/2022   CATARACT EXTRACTION Left    COLONOSCOPY     ESOPHAGOGASTRODUODENOSCOPY N/A 08/27/2020   Procedure: ESOPHAGOGASTRODUODENOSCOPY (EGD);  Surgeon: Shyrl Linnie KIDD, MD;  Location: [facility] OR;  Service: Thoracic;  Laterality: N/A;   FEMUR FRACTURE SURGERY     INGUINAL HERNIA REPAIR Right 08/07/2021   Procedure: HERNIA REPAIR INGUINAL ADULT WITH MESH;  Surgeon: Kallie Manuelita BROCKS, MD;  Location: [facility];  Service: General;  Laterality: Right;   INSERTION OF MESH N/A 08/27/2020   Procedure: INSERTION OF ACELL 7.5 x 6cm GENTRIX SURGICAL MATRIX HIATAL MESH;  Surgeon: Shyrl Linnie KIDD, MD;  Location: [facility] OR;  Service: Thoracic;  Laterality: N/A;   KIDNEY STONE SURGERY      x6   KNEE SURGERY Right    x2   MASS EXCISION Right 08/02/2018   Procedure: EXCISION RIGHT LONG FINGER MASS, DEBRIDEMENT PROXIMAL INTERPHALANGEAL JOINT WITH ROTATION FLAP;  Surgeon: Murrell Drivers, MD;  Location: [facility];  Service: Orthopedics;  Laterality: Right;   NISSEN FUNDOPLICATION     [facility]    SHOULDER OPEN ROTATOR CUFF REPAIR     XI ROBOTIC ASSISTED HIATAL HERNIA REPAIR N/A 08/27/2020   Procedure: XI ROBOTIC ASSISTED REDO HIATAL HERNIA REPAIR;  Surgeon: Shyrl Linnie KIDD, MD;  Location: [facility] OR;  Service: Thoracic;  Laterality: N/A;    Allergies: Allergies  Allergen Reactions   Egg-Derived Products Swelling    Throat- 03-31-2019 pt states can eat cakes and Pie with no issues  Throat- 03-31-2019 pt states can eat cakes and Pie with no issues    Septra [Sulfamethoxazole-Trimethoprim] Swelling    throat   Sulfonamide Derivatives Hives    Outpatient Meds: Current Outpatient Medications  Medication Sig Dispense Refill   amitriptyline  (ELAVIL ) 100 MG tablet TAKE 2 TABLETS BY MOUTH AT BEDTIME 60 tablet 5   blood glucose meter kit and supplies Dispense based on patient and insurance preference. Use up to four times daily as directed. (FOR ICD-10 E10.9, E11.9). Pt states needs One Touch Verio Meter and one touch delica  plus test strips 1 each 0   Blood Glucose Monitoring Suppl (CONTOUR BLOOD GLUCOSE SYSTEM) w/Device KIT Test blood sugars four times daily 200 each 5   budesonide -formoterol  (SYMBICORT ) 80-4.5 MCG/ACT inhaler Take 2 puffs first thing in am and then another 2 puffs about 12 hours later. 10.2 g 12   budesonide -glycopyrrolate -formoterol  (BREZTRI  AEROSPHERE) 160-9-4.8 MCG/ACT AERO inhaler Inhale into the lungs.     esomeprazole  (NEXIUM ) 40 MG capsule Take 1 capsule (40 mg total) by mouth daily at 12 noon. Take 30-60 min before first meal of the day 30 capsule 11   famotidine  (PEPCID ) 20 MG tablet Take 1 tablet (20 mg total) by mouth 2 (two) times daily.  180 tablet 3   finasteride  (PROSCAR ) 5 MG tablet TAKE ONE (1) TABLET EACH DAY 90 tablet 1   fluticasone  (FLONASE ) 50 MCG/ACT nasal spray USE 1 TO 2 SPRAYS IN EACH NOSTRIL DAILY 48 g 1   furosemide  (LASIX ) 20 MG tablet Take 1 tablet (20 mg total) by mouth daily as needed. (Patient taking differently: Take 20 mg by mouth daily.) 90 tablet 3   glucose blood (CONTOUR NEXT TEST) test strip Test blood sugars four times daily 200 each 5   Lancets (ONETOUCH DELICA PLUS LANCET33G) MISC      montelukast  (SINGULAIR ) 10 MG tablet TAKE ONE TABLET DAILY AT BEDTIME 90 tablet 1   pravastatin  (PRAVACHOL ) 80 MG tablet Take 1 tablet (80 mg total) by mouth daily. 90 tablet 3   Spacer/Aero-Holding Chambers DEVI Use with inhaler daily 1 each 0   SYRINGE-NEEDLE, DISP, 3 ML 21G X 1-1/2 3 ML MISC Use to inject testosterone  every week 50 each 0   tadalafil  (CIALIS ) 10 MG tablet TAKE 1 TABLET BY MOUTH EVERY OTHER DAY AS NEEDED 30 tablet 2   tamsulosin  (FLOMAX ) 0.4 MG CAPS capsule TAKE 1 CAPSULE DAILY AFTER SUPPER 90 capsule 3   testosterone  cypionate (DEPOTESTOSTERONE CYPIONATE) 200 MG/ML injection INJECT 100MG  (0.5ML) EVERY 10 DAYS (FOR INTRAMUSCULAR USE ONLY) 10 mL 0   albuterol  (PROVENTIL ) (2.5 MG/3ML) 0.083% nebulizer solution NEBULIZE 1 VIAL EVERY 6 HOURS AS NEEDED FOR WHEEZING & SHORTNESS OF BREATH 150 mL 0   albuterol  (VENTOLIN  HFA) 108 (90 Base) MCG/ACT inhaler INHALE 2 PUFFS EVERY 4 HOURS AS NEEDED 8.5 g 2   budesonide  (PULMICORT ) 0.25 MG/2ML nebulizer solution One vial twice daily with albuterol  (Patient taking differently: Take 0.25 mg by nebulization as needed. One vial twice daily with albuterol ) 60 mL 12   cyclobenzaprine  (FLEXERIL ) 10 MG tablet Take 1 tablet (10 mg total) by mouth 3 (three) times daily as needed for muscle spasms. Take 1 tablet by mouth 3 times daily as needed for muscle spasm. Warning: May cause drowsiness. 90 tablet 2   ELIQUIS 5 MG TABS tablet Take 5 mg by mouth 2 (two) times daily.      EPINEPHRINE  0.3 mg/0.3 mL IJ SOAJ injection INJECT 0.3ML (0.3MG ) IM ONCE 2 each 1   ferrous sulfate  325 (65 FE) MG tablet Take 325 mg by mouth daily with breakfast.     losartan  (COZAAR ) 50 MG tablet Take 1 tablet (50 mg total) by mouth daily. 90 tablet 3   meloxicam  (MOBIC ) 15 MG tablet Take 1 tablet (15 mg total) by mouth daily. 90 tablet 3   metoprolol succinate (TOPROL-XL) 25 MG 24 hr tablet Take 25 mg by mouth 2 (two) times daily.     OZEMPIC , 1 MG/DOSE, 4 MG/3ML SOPN INJECT 1MG  AS DIRECTED ONCE A WEEK 3 mL 5  Current Facility-Administered Medications  Medication Dose Route Frequency Provider Last Rate Last Admin   0.9 %  sodium chloride  infusion  500 mL Intravenous Once Danis, Victory CROME III, MD          ___________________________________________________________________ Objective   Exam:  BP (!) 146/93   Pulse 66   Temp 98.3 F (36.8 C) (Skin)   Ht 5' 8 (1.727 m)   Wt 179 lb (81.2 kg)   SpO2 98%   BMI 27.22 kg/m   CV: regular , S1/S2 Resp: clear to auscultation bilaterally, normal RR and effort noted. + systolic murmur GI: soft, no tenderness, with active bowel sounds.   Assessment: Encounter Diagnosis  Name Primary?   Hx of colonic polyps Yes     Plan: Colonoscopy   The benefits and risks of the planned procedure(s) were described in detail with the patient or (when appropriate) their health care proxy.  Risks were outlined as including, but not limited to, bleeding, infection, perforation, adverse medication reaction leading to cardiac or pulmonary decompensation, pancreatitis (if ERCP).  The limitation of incomplete mucosal visualization was also discussed.  No guarantees or warranties were given.  The patient is appropriate for an endoscopic procedure in the ambulatory setting.   - Victory Brand, MD

## 2023-08-03 NOTE — Progress Notes (Signed)
 Called to room to assist during endoscopic procedure.  Patient ID and intended procedure confirmed with present staff. Received instructions for my participation in the procedure from the performing physician.

## 2023-08-03 NOTE — Progress Notes (Signed)
 Pt resting comfortably. VSS. Airway intact. SBAR complete to RN. All questions answered.

## 2023-08-03 NOTE — Progress Notes (Signed)
 Per pt last used chewing tobacco 08-02-23 am  Pt's states no medical or surgical changes since previsit or office visit.

## 2023-08-04 ENCOUNTER — Telehealth: Payer: Self-pay

## 2023-08-04 NOTE — Telephone Encounter (Signed)
 Patient called back and stated that he is doing better but he noticed yesterday when he got home after his procedure that his ear's looked swollen and today they seem to still be pretty swollen. Patient stated that he is not sure what it might be related to. Patient is requesting a call back. Please advise.

## 2023-08-04 NOTE — Telephone Encounter (Signed)
 Returned pt's call. States he noticed swelling in both his ears after his procedure yesterday. Denies having this prior to the procedure. Denies any swelling around his nasal area or difficulty breathing. Asked if swelling seemed worse today or about the same, pt unable to tell RN as he states he did not look at them in a mirror, he just can feel that they are tight and painful. Pt has not taken any medication for this. Recommended to pt that he could take Benadryl  if needed. Also instructed pt that he needed to f/u with his PCP or an Urgent Care ASAP to be evaluated, especially if he develops SOB. Pt verbalized understanding and stated he would contact his PCP.

## 2023-08-04 NOTE — Telephone Encounter (Signed)
 No answer after follow up call. Voice message left.

## 2023-08-05 ENCOUNTER — Ambulatory Visit: Admitting: Family Medicine

## 2023-08-05 ENCOUNTER — Other Ambulatory Visit: Payer: Self-pay

## 2023-08-05 VITALS — BP 138/84 | Ht 68.0 in | Wt 178.0 lb

## 2023-08-05 DIAGNOSIS — M19019 Primary osteoarthritis, unspecified shoulder: Secondary | ICD-10-CM

## 2023-08-05 DIAGNOSIS — M25812 Other specified joint disorders, left shoulder: Secondary | ICD-10-CM | POA: Diagnosis not present

## 2023-08-05 MED ORDER — METHYLPREDNISOLONE ACETATE 40 MG/ML IJ SUSP
20.0000 mg | Freq: Once | INTRAMUSCULAR | Status: AC
  Administered 2023-08-05: 20 mg via INTRA_ARTICULAR

## 2023-08-05 MED ORDER — DICLOFENAC SODIUM 75 MG PO TBEC
75.0000 mg | DELAYED_RELEASE_TABLET | Freq: Two times a day (BID) | ORAL | 1 refills | Status: DC
Start: 2023-08-05 — End: 2023-08-05

## 2023-08-05 NOTE — Progress Notes (Signed)
 PCP: Dettinger, Fonda LABOR, MD  Subjective:   HPI: Patient is a 63 y.o. male here for left shoulder pain.  Patient reports about a month of left shoulder pain that usually happens with pushing.  He has not had any trauma or injury.  States that he has pain with pressing motions such as bench press.  Patient states he was here in 2020 for similar pain and got a steroid injection and has been pain-free since then.   Past Medical History:  Diagnosis Date   Allergy    Alpha galactosidase deficiency    Anemia    past hx    Arthritis    Asthma    Blood transfusion without reported diagnosis    BPH (benign prostatic hypertrophy)    Cataract    removed left eye    Complication of anesthesia    Diabetes mellitus without complication (HCC)    Dysrhythmia    GERD (gastroesophageal reflux disease)    Heart murmur    History of kidney stones    HOH (hard of hearing)    Hyperlipidemia    Hypertension    Irregular heart beat    Muscular dystrophy (HCC)    PONV (postoperative nausea and vomiting)    Sleep apnea    Wears hearing aid in both ears     Current Outpatient Medications on File Prior to Visit  Medication Sig Dispense Refill   albuterol  (PROVENTIL ) (2.5 MG/3ML) 0.083% nebulizer solution NEBULIZE 1 VIAL EVERY 6 HOURS AS NEEDED FOR WHEEZING & SHORTNESS OF BREATH 150 mL 0   albuterol  (VENTOLIN  HFA) 108 (90 Base) MCG/ACT inhaler INHALE 2 PUFFS EVERY 4 HOURS AS NEEDED 8.5 g 2   amitriptyline  (ELAVIL ) 100 MG tablet TAKE 2 TABLETS BY MOUTH AT BEDTIME 60 tablet 5   blood glucose meter kit and supplies Dispense based on patient and insurance preference. Use up to four times daily as directed. (FOR ICD-10 E10.9, E11.9). Pt states needs One Touch Verio Meter and one touch delica plus test strips 1 each 0   Blood Glucose Monitoring Suppl (CONTOUR BLOOD GLUCOSE SYSTEM) w/Device KIT Test blood sugars four times daily 200 each 5   budesonide  (PULMICORT ) 0.25 MG/2ML nebulizer solution One vial  twice daily with albuterol  (Patient taking differently: Take 0.25 mg by nebulization as needed. One vial twice daily with albuterol ) 60 mL 12   budesonide -formoterol  (SYMBICORT ) 80-4.5 MCG/ACT inhaler Take 2 puffs first thing in am and then another 2 puffs about 12 hours later. 10.2 g 12   budesonide -glycopyrrolate -formoterol  (BREZTRI  AEROSPHERE) 160-9-4.8 MCG/ACT AERO inhaler Inhale into the lungs.     cyclobenzaprine  (FLEXERIL ) 10 MG tablet Take 1 tablet (10 mg total) by mouth 3 (three) times daily as needed for muscle spasms. Take 1 tablet by mouth 3 times daily as needed for muscle spasm. Warning: May cause drowsiness. 90 tablet 2   ELIQUIS 5 MG TABS tablet Take 5 mg by mouth 2 (two) times daily.     EPINEPHRINE  0.3 mg/0.3 mL IJ SOAJ injection INJECT 0.3ML (0.3MG ) IM ONCE 2 each 1   esomeprazole  (NEXIUM ) 40 MG capsule Take 1 capsule (40 mg total) by mouth daily at 12 noon. Take 30-60 min before first meal of the day 30 capsule 11   famotidine  (PEPCID ) 20 MG tablet Take 1 tablet (20 mg total) by mouth 2 (two) times daily. 180 tablet 3   ferrous sulfate  325 (65 FE) MG tablet Take 325 mg by mouth daily with breakfast.     finasteride  (  PROSCAR ) 5 MG tablet TAKE ONE (1) TABLET EACH DAY 90 tablet 1   fluticasone  (FLONASE ) 50 MCG/ACT nasal spray USE 1 TO 2 SPRAYS IN EACH NOSTRIL DAILY 48 g 1   furosemide  (LASIX ) 20 MG tablet Take 1 tablet (20 mg total) by mouth daily as needed. (Patient taking differently: Take 20 mg by mouth daily.) 90 tablet 3   glucose blood (CONTOUR NEXT TEST) test strip Test blood sugars four times daily 200 each 5   Lancets (ONETOUCH DELICA PLUS LANCET33G) MISC      losartan  (COZAAR ) 50 MG tablet Take 1 tablet (50 mg total) by mouth daily. 90 tablet 3   metoprolol succinate (TOPROL-XL) 25 MG 24 hr tablet Take 25 mg by mouth 2 (two) times daily.     montelukast  (SINGULAIR ) 10 MG tablet TAKE ONE TABLET DAILY AT BEDTIME 90 tablet 1   OZEMPIC , 1 MG/DOSE, 4 MG/3ML SOPN INJECT 1MG  AS  DIRECTED ONCE A WEEK 3 mL 5   pravastatin  (PRAVACHOL ) 80 MG tablet Take 1 tablet (80 mg total) by mouth daily. 90 tablet 3   Spacer/Aero-Holding Chambers DEVI Use with inhaler daily 1 each 0   SYRINGE-NEEDLE, DISP, 3 ML 21G X 1-1/2 3 ML MISC Use to inject testosterone  every week 50 each 0   tadalafil  (CIALIS ) 10 MG tablet TAKE 1 TABLET BY MOUTH EVERY OTHER DAY AS NEEDED 30 tablet 2   tamsulosin  (FLOMAX ) 0.4 MG CAPS capsule TAKE 1 CAPSULE DAILY AFTER SUPPER 90 capsule 3   testosterone  cypionate (DEPOTESTOSTERONE CYPIONATE) 200 MG/ML injection INJECT 100MG  (0.5ML) EVERY 10 DAYS (FOR INTRAMUSCULAR USE ONLY) 10 mL 0   No current facility-administered medications on file prior to visit.    Past Surgical History:  Procedure Laterality Date   CARDIAC ELECTROPHYSIOLOGY STUDY AND ABLATION  11/2022   CATARACT EXTRACTION Left    COLONOSCOPY     ESOPHAGOGASTRODUODENOSCOPY N/A 08/27/2020   Procedure: ESOPHAGOGASTRODUODENOSCOPY (EGD);  Surgeon: Shyrl Linnie KIDD, MD;  Location: [facility] OR;  Service: Thoracic;  Laterality: N/A;   FEMUR FRACTURE SURGERY     INGUINAL HERNIA REPAIR Right 08/07/2021   Procedure: HERNIA REPAIR INGUINAL ADULT WITH MESH;  Surgeon: Kallie Manuelita BROCKS, MD;  Location: [facility];  Service: General;  Laterality: Right;   INSERTION OF MESH N/A 08/27/2020   Procedure: INSERTION OF ACELL 7.5 x 6cm GENTRIX SURGICAL MATRIX HIATAL MESH;  Surgeon: Shyrl Linnie KIDD, MD;  Location: [facility] OR;  Service: Thoracic;  Laterality: N/A;   KIDNEY STONE SURGERY     x6   KNEE SURGERY Right    x2   MASS EXCISION Right 08/02/2018   Procedure: EXCISION RIGHT LONG FINGER MASS, DEBRIDEMENT PROXIMAL INTERPHALANGEAL JOINT WITH ROTATION FLAP;  Surgeon: Murrell Drivers, MD;  Location: [facility];  Service: Orthopedics;  Laterality: Right;   NISSEN FUNDOPLICATION     [facility]    SHOULDER OPEN ROTATOR CUFF REPAIR     XI ROBOTIC ASSISTED HIATAL HERNIA REPAIR N/A 08/27/2020   Procedure: XI ROBOTIC  ASSISTED REDO HIATAL HERNIA REPAIR;  Surgeon: Shyrl Linnie KIDD, MD;  Location: [facility] OR;  Service: Thoracic;  Laterality: N/A;    Allergies  Allergen Reactions   Egg-Derived Products Swelling    Throat- 03-31-2019 pt states can eat cakes and Pie with no issues  Throat- 03-31-2019 pt states can eat cakes and Pie with no issues    Septra [Sulfamethoxazole-Trimethoprim] Swelling    throat   Sulfonamide Derivatives Hives    BP 138/84   Ht 5' 8 (1.727  m)   Wt 178 lb (80.7 kg)   BMI 27.06 kg/m       No data to display              No data to display              Objective:  Physical Exam:  Gen: NAD, comfortable in exam room  Shoulder: No asymmetry, muscular atrophy, deformities or swelling appreciated. Tender to palpation at [facility] joint FROM with forward flexion, extension, external and internal rotation, abduction. Decreased strength with external rotation at 5-/5 level.  Negative empty can, negative speeds and Yergason's.  Positive Neer's and Hawkins with internal rotation.  Negative labral grind test Neurovascularly intact   Assessment & Plan:   1. AC joint arthropathy 2. Impingement of left shoulder Given patient's symptomatology and exam findings likely has AC joint arthritis - Will do rotator cuff and scapular strengthening home physical therapy -Defer NSAID therapy as patient has a history of HFpEF and is taking Eliquis - initially sent in diclofenac  but told patient not to fill - he verbalized understanding -proceed with steroid injection of the [facility] joint.  Discussed with the patient as he is having symptoms in 2 different regions this may not cause complete resolution of his symptoms  After informed written consent timeout was performed, patient was seated in chair in exam room. Left shoulder was prepped with alcohol swab and utilizing lateral approach with ultrasound guidance, patient's left AC joint injection was injected with 0.5:0.66mL lidocaine : depomedrol.  Patient tolerated the procedure well without immediate complications.

## 2023-08-05 NOTE — Telephone Encounter (Signed)
 Thank you for the note and for sending this to the covering physician since I was out of the office yesterday.  Unclear why that occurred for him, would not be typical for an allergic reaction to anything procedure related.  VEAR Brand MD

## 2023-08-05 NOTE — Patient Instructions (Addendum)
 You have rotator cuff impingement and AC arthritis. Try to avoid painful activities (overhead activities, lifting with extended arm) as much as possible. Voltaren  gel up to 4 times a day topically as needed. You were given an injection at the [facility] joint today. Consider physical therapy with transition to home exercise program. Do home exercise program with theraband and scapular stabilization exercises daily 3 sets of 10 once a day. If not improving at follow-up we will consider further imaging, subacromial injection, physical therapy, and/or nitro patches. Follow up with me in 1 month to 6 weeks.

## 2023-08-06 LAB — SURGICAL PATHOLOGY

## 2023-08-07 DIAGNOSIS — E119 Type 2 diabetes mellitus without complications: Secondary | ICD-10-CM | POA: Diagnosis not present

## 2023-08-08 ENCOUNTER — Encounter: Payer: Self-pay | Admitting: Gastroenterology

## 2023-08-11 ENCOUNTER — Ambulatory Visit: Payer: Self-pay | Admitting: Gastroenterology

## 2023-08-11 ENCOUNTER — Encounter: Payer: Self-pay | Admitting: Family Medicine

## 2023-08-11 ENCOUNTER — Ambulatory Visit: Admitting: Family

## 2023-08-12 ENCOUNTER — Other Ambulatory Visit: Payer: Self-pay | Admitting: Medical Genetics

## 2023-08-12 ENCOUNTER — Other Ambulatory Visit: Payer: Self-pay

## 2023-08-12 ENCOUNTER — Encounter: Payer: Self-pay | Admitting: Family Medicine

## 2023-08-12 ENCOUNTER — Ambulatory Visit (INDEPENDENT_AMBULATORY_CARE_PROVIDER_SITE_OTHER): Admitting: Family Medicine

## 2023-08-12 VITALS — BP 133/76 | HR 91 | Ht 68.0 in | Wt 178.0 lb

## 2023-08-12 DIAGNOSIS — K121 Other forms of stomatitis: Secondary | ICD-10-CM | POA: Diagnosis not present

## 2023-08-12 MED ORDER — NYSTATIN 100000 UNIT/ML MT SUSP: 5.0000 mL | Freq: Three times a day (TID) | OROMUCOSAL | 0 refills | Status: DC

## 2023-08-12 MED ORDER — NYSTATIN 100000 UNIT/ML MT SUSP: 5.0000 mL | Freq: Three times a day (TID) | OROMUCOSAL | 0 refills | Status: AC

## 2023-08-12 MED ORDER — VALACYCLOVIR HCL 1 G PO TABS: 1000.0000 mg | ORAL_TABLET | Freq: Two times a day (BID) | ORAL | 0 refills | Status: AC

## 2023-08-12 NOTE — Progress Notes (Signed)
 BP 133/76   Pulse 91   Ht 5' 8 (1.727 m)   Wt 178 lb (80.7 kg)   SpO2 98%   BMI 27.06 kg/m    Subjective:   Patient ID: Johnny Mathews, male    DOB: [birth year], 63 y.o.   MRN: [id number]  HPI: Johnny Mathews is a 63 y.o. male presenting on 08/12/2023 for Oral Swelling (Roof of mouth swelling x2d. Unsure what caused. No changes.) and Shortness of Breath (Not new for pt)   Discussed the use of AI scribe software for clinical note transcription with the patient, who gave verbal consent to proceed.  History of Present Illness   Johnny Mathews is a 63 year old male who presents with cough and throat swelling.  Symptoms began approximately four days ago and were real light the first two days. He has a persistent cough that occurs throughout the night, accompanied by a sensation of swelling in the back of his mouth, particularly at the top right where the nasal passages connect to the throat.  He experiences nasal congestion and coughs up a small amount of yellow phlegm. The severity of the cough and associated symptoms has disrupted his sleep, leading to daytime fatigue. No fevers or chills.  He uses his albuterol  inhaler, which provides temporary relief, and Symbicort  at night. He also takes Mucinex with salt every night, which he finds helpful. There have been no recent changes in his medications or diet.  During the review of symptoms, he mentions feeling dizzy today and experiencing pain in his sinuses, particularly under the eyes and at the top of the nostrils. No recent illness in those around him.          Relevant past medical, surgical, family and social history reviewed and updated as indicated. Interim medical history since our last visit reviewed. Allergies and medications reviewed and updated.  Review of Systems  Constitutional:  Negative for chills and fever.  HENT:  Positive for congestion, sinus pressure and sinus pain.   Respiratory:  Positive for cough. Negative  for shortness of breath and wheezing.   Cardiovascular:  Negative for chest pain and leg swelling.  Musculoskeletal:  Negative for back pain and gait problem.  Skin:  Negative for rash.  All other systems reviewed and are negative.   Per HPI unless specifically indicated above   Allergies as of 08/12/2023       Reactions   Egg-derived Products Swelling   Throat- 03-31-2019 pt states can eat cakes and Pie with no issues  Throat- 03-31-2019 pt states can eat cakes and Pie with no issues    Septra [sulfamethoxazole-trimethoprim] Swelling   throat   Sulfonamide Derivatives Hives        Medication List        Accurate as of August 12, 2023  8:50 AM. If you have any questions, ask your nurse or doctor.          albuterol  (2.5 MG/3ML) 0.083% nebulizer solution Commonly known as: PROVENTIL  NEBULIZE 1 VIAL EVERY 6 HOURS AS NEEDED FOR WHEEZING & SHORTNESS OF BREATH   albuterol  108 (90 Base) MCG/ACT inhaler Commonly known as: VENTOLIN  HFA INHALE 2 PUFFS EVERY 4 HOURS AS NEEDED   amitriptyline  100 MG tablet Commonly known as: ELAVIL  TAKE 2 TABLETS BY MOUTH AT BEDTIME   blood glucose meter kit and supplies Dispense based on patient and insurance preference. Use up to four times daily as directed. (FOR ICD-10 E10.9, E11.9). Pt states needs  One Touch Verio Meter and one touch delica plus test strips   Breztri  Aerosphere 160-9-4.8 MCG/ACT Aero inhaler Generic drug: budesonide -glycopyrrolate -formoterol  Inhale into the lungs.   budesonide  0.25 MG/2ML nebulizer solution Commonly known as: Pulmicort  One vial twice daily with albuterol  What changed:  how much to take how to take this when to take this reasons to take this   budesonide -formoterol  80-4.5 MCG/ACT inhaler Commonly known as: Symbicort  Take 2 puffs first thing in am and then another 2 puffs about 12 hours later.   Contour Blood Glucose System w/Device Kit Test blood sugars four times daily   cyclobenzaprine  10 MG  tablet Commonly known as: FLEXERIL  Take 1 tablet (10 mg total) by mouth 3 (three) times daily as needed for muscle spasms. Take 1 tablet by mouth 3 times daily as needed for muscle spasm. Warning: May cause drowsiness.   Eliquis 5 MG Tabs tablet Generic drug: apixaban Take 5 mg by mouth 2 (two) times daily.   EPINEPHrine  0.3 mg/0.3 mL Soaj injection Commonly known as: EPI-PEN INJECT 0.3ML (0.3MG ) IM ONCE   esomeprazole  40 MG capsule Commonly known as: NexIUM  Take 1 capsule (40 mg total) by mouth daily at 12 noon. Take 30-60 min before first meal of the day   famotidine  20 MG tablet Commonly known as: PEPCID  Take 1 tablet (20 mg total) by mouth 2 (two) times daily.   ferrous sulfate  325 (65 FE) MG tablet Take 325 mg by mouth daily with breakfast.   finasteride  5 MG tablet Commonly known as: PROSCAR  TAKE ONE (1) TABLET EACH DAY   fluticasone  50 MCG/ACT nasal spray Commonly known as: FLONASE  USE 1 TO 2 SPRAYS IN EACH NOSTRIL DAILY   furosemide  20 MG tablet Commonly known as: LASIX  Take 1 tablet (20 mg total) by mouth daily as needed. What changed: when to take this   glucose blood test strip Commonly known as: Contour Next Test Test blood sugars four times daily   losartan  50 MG tablet Commonly known as: COZAAR  Take 1 tablet (50 mg total) by mouth daily.   magic mouthwash (nystatin , lidocaine , diphenhydrAMINE , alum & mag hydroxide) suspension Swish and swallow 5 mLs 3 (three) times daily for 10 days. Started by: Fonda LABOR Daly Whipkey   metoprolol succinate 25 MG 24 hr tablet Commonly known as: TOPROL-XL Take 25 mg by mouth 2 (two) times daily.   montelukast  10 MG tablet Commonly known as: SINGULAIR  TAKE ONE TABLET DAILY AT BEDTIME   OneTouch Delica Plus Lancet33G Misc   Ozempic  (1 MG/DOSE) 4 MG/3ML Sopn Generic drug: Semaglutide  (1 MG/DOSE) INJECT 1MG  AS DIRECTED ONCE A WEEK   pravastatin  80 MG tablet Commonly known as: PRAVACHOL  Take 1 tablet (80 mg total)  by mouth daily.   Spacer/Aero-Holding [employer] Use with inhaler daily   SYRINGE-NEEDLE (DISP) 3 ML 21G X 1-1/2 3 ML Misc Use to inject testosterone  every week   tadalafil  10 MG tablet Commonly known as: CIALIS  TAKE 1 TABLET BY MOUTH EVERY OTHER DAY AS NEEDED   tamsulosin  0.4 MG Caps capsule Commonly known as: FLOMAX  TAKE 1 CAPSULE DAILY AFTER SUPPER   testosterone  cypionate 200 MG/ML injection Commonly known as: DEPOTESTOSTERONE CYPIONATE INJECT 100MG  (0.5ML) EVERY 10 DAYS (FOR INTRAMUSCULAR USE ONLY)   valACYclovir  1000 MG tablet Commonly known as: VALTREX  Take 1 tablet (1,000 mg total) by mouth 2 (two) times daily for 10 days. Started by: Fonda LABOR Yarelis Ambrosino         Objective:   BP 133/76   Pulse 91  Ht 5' 8 (1.727 m)   Wt 178 lb (80.7 kg)   SpO2 98%   BMI 27.06 kg/m   Wt Readings from Last 3 Encounters:  08/12/23 178 lb (80.7 kg)  08/05/23 178 lb (80.7 kg)  08/03/23 179 lb (81.2 kg)    Physical Exam Vitals and nursing note reviewed.  Constitutional:      General: He is not in acute distress.    Appearance: He is well-developed. He is not diaphoretic.  Eyes:     General: No scleral icterus.       Right eye: No discharge.     Conjunctiva/sclera: Conjunctivae normal.     Pupils: Pupils are equal, round, and reactive to light.  Neck:     Thyroid : No thyromegaly.  Cardiovascular:     Rate and Rhythm: Normal rate and regular rhythm.     Heart sounds: Normal heart sounds. No murmur heard. Pulmonary:     Effort: Pulmonary effort is normal. No respiratory distress.     Breath sounds: Normal breath sounds. No wheezing.  Musculoskeletal:        General: Normal range of motion.     Cervical back: Neck supple.  Lymphadenopathy:     Cervical: No cervical adenopathy.  Skin:    General: Skin is warm and dry.     Findings: No rash.  Neurological:     Mental Status: He is alert and oriented to person, place, and time.     Coordination: Coordination  normal.  Psychiatric:        Behavior: Behavior normal.    Physical Exam   HEENT: Ears without infection. Sores in the posterior pharynx. Maxillary sinus tenderness. NECK: No cervical lymphadenopathy. Thyroid  normal. CHEST: Lungs clear to auscultation bilaterally. Cardiac : irregular rate and rhythm. a 3 out of 6 systolic murmur. heard throughout         Assessment & Plan:   Problem List Items Addressed This Visit   None Visit Diagnoses       Oral ulcer    -  Primary   Relevant Medications   magic mouthwash (nystatin , lidocaine , diphenhydrAMINE , alum & mag hydroxide) suspension   valACYclovir  (VALTREX ) 1000 MG tablet         Viral pharyngitis with oropharyngeal ulcers Likely viral etiology due to sores and absence of bacterial infection signs. - Prescribed antiviral medication. - Prescribed Magic Mouthwash with antiviral agents. - Advised Benadryl  for swelling and sleep aid. - Recommended continued use of Mucinex. - Suggested Tylenol  for pain management.  Cough Persistent cough associated with viral pharyngitis, causing sleep disturbances and fatigue. - Advised continued use of albuterol  inhaler as needed. - Recommended Mucinex for mucus clearance.  Fatigue due to illness Fatigue secondary to persistent cough and sleep disruption from viral pharyngitis. - Encouraged hydration. - Advised Benadryl  to aid sleep.  Systolic heart murmur Chronic systolic heart murmur, unchanged from previous evaluations.          Follow up plan: No follow-ups on file.  Counseling provided for all of the vaccine components No orders of the defined types were placed in this encounter.   Fonda Levins, MD [facility] 08/12/2023, 8:50 AM

## 2023-08-17 ENCOUNTER — Other Ambulatory Visit: Payer: Self-pay

## 2023-08-17 DIAGNOSIS — K121 Other forms of stomatitis: Secondary | ICD-10-CM

## 2023-08-18 ENCOUNTER — Other Ambulatory Visit (HOSPITAL_COMMUNITY)
Admission: RE | Admit: 2023-08-18 | Discharge: 2023-08-18 | Disposition: A | Discharge status: Home / Self Care | Payer: Self-pay | Source: Ambulatory Visit | Attending: Medical Genetics | Admitting: Medical Genetics

## 2023-08-20 DIAGNOSIS — Z95818 Presence of other cardiac implants and grafts: Secondary | ICD-10-CM | POA: Diagnosis not present

## 2023-08-29 LAB — GENECONNECT MOLECULAR SCREEN: Genetic Analysis Overall Interpretation: NEGATIVE

## 2023-09-02 ENCOUNTER — Ambulatory Visit: Admitting: Family Medicine

## 2023-09-04 ENCOUNTER — Other Ambulatory Visit (HOSPITAL_COMMUNITY): Payer: Self-pay

## 2023-09-04 NOTE — Telephone Encounter (Signed)
 A user error has taken place: orders placed in error, not carried out on this patient.

## 2023-09-07 DIAGNOSIS — E119 Type 2 diabetes mellitus without complications: Secondary | ICD-10-CM | POA: Diagnosis not present

## 2023-09-20 DIAGNOSIS — Z95818 Presence of other cardiac implants and grafts: Secondary | ICD-10-CM | POA: Diagnosis not present

## 2023-09-24 ENCOUNTER — Other Ambulatory Visit: Payer: Self-pay | Admitting: Family Medicine

## 2023-09-28 ENCOUNTER — Ambulatory Visit (INDEPENDENT_AMBULATORY_CARE_PROVIDER_SITE_OTHER): Admitting: Family Medicine

## 2023-09-28 ENCOUNTER — Encounter: Payer: Self-pay | Admitting: Family Medicine

## 2023-09-28 ENCOUNTER — Other Ambulatory Visit: Payer: Self-pay

## 2023-09-28 VITALS — BP 130/84 | Ht 68.5 in | Wt 178.0 lb

## 2023-09-28 DIAGNOSIS — M25512 Pain in left shoulder: Secondary | ICD-10-CM | POA: Diagnosis not present

## 2023-09-28 DIAGNOSIS — M25812 Other specified joint disorders, left shoulder: Secondary | ICD-10-CM

## 2023-09-28 DIAGNOSIS — G8929 Other chronic pain: Secondary | ICD-10-CM

## 2023-09-28 MED ORDER — METHYLPREDNISOLONE ACETATE 40 MG/ML IJ SUSP
40.0000 mg | Freq: Once | INTRAMUSCULAR | Status: AC
Start: 2023-09-28 — End: 2023-09-28
  Administered 2023-09-28: 40 mg via INTRA_ARTICULAR

## 2023-09-28 NOTE — Patient Instructions (Signed)
 Your shoulder pain is due either to early frozen shoulder or more likely arthritis of the shoulder joint. Start physical therapy and do home exercises on days you don't go to therapy. You were given an injection today. Ice or heat 15 minutes at a time as needed. Voltaren  gel topically up to 4 times a day as needed. Follow up in 5-6 weeks.

## 2023-09-28 NOTE — Progress Notes (Signed)
 PCP: Dettinger, Fonda LABOR, MD  Subjective:   HPI: Patient is a 63 y.o. male here for left shoulder pain.  Patient reports AC joint injection last visit helped quite a bit though didn't take pain away in his left shoulder. Pain currently in a different location within left shoulder and not superiorly. Locked up on him yesterday and felt like a cramp. No new injuries. Not doing anything to treat this currently.  Past Medical History:  Diagnosis Date   Allergy    Alpha galactosidase deficiency    Anemia    past hx    Arthritis    Asthma    Blood transfusion without reported diagnosis    BPH (benign prostatic hypertrophy)    Cataract    removed left eye    Complication of anesthesia    Diabetes mellitus without complication (HCC)    Dysrhythmia    GERD (gastroesophageal reflux disease)    Heart murmur    History of kidney stones    HOH (hard of hearing)    Hyperlipidemia    Hypertension    Irregular heart beat    Muscular dystrophy (HCC)    PONV (postoperative nausea and vomiting)    Sleep apnea    Wears hearing aid in both ears     Current Outpatient Medications on File Prior to Visit  Medication Sig Dispense Refill   albuterol  (PROVENTIL ) (2.5 MG/3ML) 0.083% nebulizer solution NEBULIZE 1 VIAL EVERY 6 HOURS AS NEEDED FOR WHEEZING & SHORTNESS OF BREATH 150 mL 0   albuterol  (VENTOLIN  HFA) 108 (90 Base) MCG/ACT inhaler INHALE 2 PUFFS EVERY 4 HOURS AS NEEDED 8.5 g 2   amitriptyline  (ELAVIL ) 100 MG tablet TAKE 2 TABLETS BY MOUTH AT BEDTIME 60 tablet 5   blood glucose meter kit and supplies Dispense based on patient and insurance preference. Use up to four times daily as directed. (FOR ICD-10 E10.9, E11.9). Pt states needs One Touch Verio Meter and one touch delica plus test strips 1 each 0   Blood Glucose Monitoring Suppl (CONTOUR BLOOD GLUCOSE SYSTEM) w/Device KIT Test blood sugars four times daily 200 each 5   budesonide  (PULMICORT ) 0.25 MG/2ML nebulizer solution One vial  twice daily with albuterol  (Patient taking differently: Take 0.25 mg by nebulization as needed. One vial twice daily with albuterol ) 60 mL 12   budesonide -formoterol  (SYMBICORT ) 80-4.5 MCG/ACT inhaler Take 2 puffs first thing in am and then another 2 puffs about 12 hours later. 10.2 g 12   budesonide -glycopyrrolate -formoterol  (BREZTRI  AEROSPHERE) 160-9-4.8 MCG/ACT AERO inhaler Inhale into the lungs.     cyclobenzaprine  (FLEXERIL ) 10 MG tablet Take 1 tablet (10 mg total) by mouth 3 (three) times daily as needed for muscle spasms. Take 1 tablet by mouth 3 times daily as needed for muscle spasm. Warning: May cause drowsiness. 90 tablet 2   ELIQUIS 5 MG TABS tablet Take 5 mg by mouth 2 (two) times daily.     EPINEPHRINE  0.3 mg/0.3 mL IJ SOAJ injection INJECT 0.3ML (0.3MG ) IM ONCE 2 each 1   esomeprazole  (NEXIUM ) 40 MG capsule Take 1 capsule (40 mg total) by mouth daily at 12 noon. Take 30-60 min before first meal of the day 30 capsule 11   famotidine  (PEPCID ) 20 MG tablet TAKE ONE (1) TABLET BY MOUTH TWO (2) TIMES DAILY 180 tablet 0   ferrous sulfate  325 (65 FE) MG tablet Take 325 mg by mouth daily with breakfast.     finasteride  (PROSCAR ) 5 MG tablet TAKE ONE (1) TABLET EACH  DAY 90 tablet 1   fluticasone  (FLONASE ) 50 MCG/ACT nasal spray USE 1 TO 2 SPRAYS IN EACH NOSTRIL DAILY 48 g 1   furosemide  (LASIX ) 20 MG tablet Take 1 tablet (20 mg total) by mouth daily as needed. (Patient taking differently: Take 20 mg by mouth daily.) 90 tablet 3   glucose blood (CONTOUR NEXT TEST) test strip Test blood sugars four times daily 200 each 5   Lancets (ONETOUCH DELICA PLUS LANCET33G) MISC      losartan  (COZAAR ) 50 MG tablet Take 1 tablet (50 mg total) by mouth daily. 90 tablet 3   metoprolol succinate (TOPROL-XL) 25 MG 24 hr tablet Take 25 mg by mouth 2 (two) times daily.     montelukast  (SINGULAIR ) 10 MG tablet TAKE ONE TABLET DAILY AT BEDTIME 90 tablet 1   OZEMPIC , 1 MG/DOSE, 4 MG/3ML SOPN INJECT 1MG  AS DIRECTED  ONCE A WEEK 3 mL 5   pravastatin  (PRAVACHOL ) 80 MG tablet Take 1 tablet (80 mg total) by mouth daily. 90 tablet 3   Spacer/Aero-Holding [employer] Use with inhaler daily 1 each 0   SYRINGE-NEEDLE, DISP, 3 ML 21G X 1-1/2 3 ML MISC Use to inject testosterone  every week 50 each 0   tadalafil  (CIALIS ) 10 MG tablet TAKE 1 TABLET BY MOUTH EVERY OTHER DAY AS NEEDED 30 tablet 2   tamsulosin  (FLOMAX ) 0.4 MG CAPS capsule TAKE 1 CAPSULE DAILY AFTER SUPPER 90 capsule 3   testosterone  cypionate (DEPOTESTOSTERONE CYPIONATE) 200 MG/ML injection INJECT 100MG  (0.5ML) EVERY 10 DAYS (FOR INTRAMUSCULAR USE ONLY) 10 mL 0   No current facility-administered medications on file prior to visit.    Past Surgical History:  Procedure Laterality Date   CARDIAC ELECTROPHYSIOLOGY STUDY AND ABLATION  11/2022   CATARACT EXTRACTION Left    COLONOSCOPY     ESOPHAGOGASTRODUODENOSCOPY N/A 08/27/2020   Procedure: ESOPHAGOGASTRODUODENOSCOPY (EGD);  Surgeon: Shyrl Linnie KIDD, MD;  Location: [facility] OR;  Service: Thoracic;  Laterality: N/A;   FEMUR FRACTURE SURGERY     INGUINAL HERNIA REPAIR Right 08/07/2021   Procedure: HERNIA REPAIR INGUINAL ADULT WITH MESH;  Surgeon: Kallie Manuelita BROCKS, MD;  Location: [facility] ORS;  Service: General;  Laterality: Right;   INSERTION OF MESH N/A 08/27/2020   Procedure: INSERTION OF ACELL 7.5 x 6cm GENTRIX SURGICAL MATRIX HIATAL MESH;  Surgeon: Shyrl Linnie KIDD, MD;  Location: [facility] OR;  Service: Thoracic;  Laterality: N/A;   KIDNEY STONE SURGERY     x6   KNEE SURGERY Right    x2   MASS EXCISION Right 08/02/2018   Procedure: EXCISION RIGHT LONG FINGER MASS, DEBRIDEMENT PROXIMAL INTERPHALANGEAL JOINT WITH ROTATION FLAP;  Surgeon: Murrell Drivers, MD;  Location: [facility];  Service: Orthopedics;  Laterality: Right;   NISSEN FUNDOPLICATION     [facility]    SHOULDER OPEN ROTATOR CUFF REPAIR     XI ROBOTIC ASSISTED HIATAL HERNIA REPAIR N/A 08/27/2020   Procedure: XI ROBOTIC ASSISTED  REDO HIATAL HERNIA REPAIR;  Surgeon: Shyrl Linnie KIDD, MD;  Location: [facility] OR;  Service: Thoracic;  Laterality: N/A;    Allergies  Allergen Reactions   Egg-Derived Products Swelling    Throat- 03-31-2019 pt states can eat cakes and Pie with no issues  Throat- 03-31-2019 pt states can eat cakes and Pie with no issues    Septra [Sulfamethoxazole-Trimethoprim] Swelling    throat   Sulfonamide Derivatives Hives    BP 130/84   Ht 5' 8.5 (1.74 m)   Wt 178 lb (80.7 kg)  BMI 26.67 kg/m       No data to display              No data to display              Objective:  Physical Exam:  Gen: NAD, comfortable in exam room  Left shoulder: No swelling, ecchymoses.  No gross deformity. Mild TTP AC joint but does not reproduce his currently pain ER only to 45 degrees compared to 80 on right.  Full other motions. Negative Hawkins, Neers. Strength 5/5 with empty can and resisted external rotation. 4/5 resisted internal rotation Negative o'briens NV intact distally.   Assessment & Plan:  1. Left shoulder pain - consistent with either arthritis of the glenohumeral joint or early adhesive capsulitis.  Would expect more pain with the latter.  Both treated similarly.  He will start physical therapy.  Glenohumeral injection given as well.  Ice/heat if needed, voltaren  gel if needed.  Follow up in 5-6 weeks.  After informed written consent timeout was performed, patient was seated in chair in exam room. Left shoulder was prepped with alcohol swab and utilizing ultrasound guidance, patient's left glenohumeral space was injected with 3:1 lidocaine : depomedrol. Patient tolerated the procedure well without immediate complications.

## 2023-10-07 ENCOUNTER — Encounter: Payer: Self-pay | Admitting: Family Medicine

## 2023-10-08 ENCOUNTER — Encounter: Payer: Self-pay | Admitting: Family Medicine

## 2023-10-08 ENCOUNTER — Ambulatory Visit (INDEPENDENT_AMBULATORY_CARE_PROVIDER_SITE_OTHER)

## 2023-10-08 ENCOUNTER — Ambulatory Visit (INDEPENDENT_AMBULATORY_CARE_PROVIDER_SITE_OTHER): Admitting: Family Medicine

## 2023-10-08 VITALS — BP 141/81 | HR 70 | Temp 98.5°F | Ht 68.0 in | Wt 182.0 lb

## 2023-10-08 DIAGNOSIS — M79675 Pain in left toe(s): Secondary | ICD-10-CM | POA: Diagnosis not present

## 2023-10-08 DIAGNOSIS — Z23 Encounter for immunization: Secondary | ICD-10-CM | POA: Diagnosis not present

## 2023-10-08 DIAGNOSIS — L03032 Cellulitis of left toe: Secondary | ICD-10-CM | POA: Diagnosis not present

## 2023-10-08 MED ORDER — CEPHALEXIN 500 MG PO CAPS: 500.0000 mg | ORAL_CAPSULE | Freq: Four times a day (QID) | ORAL | 0 refills | Status: DC

## 2023-10-08 NOTE — Progress Notes (Signed)
 BP (!) 141/81   Pulse 70   Temp 98.5 F (36.9 C) (Temporal)   Ht 5' 8 (1.727 m)   Wt 182 lb (82.6 kg)   SpO2 96%   BMI 27.67 kg/m    Subjective:   Patient ID: Johnny Mathews, male    DOB: [birth year], 63 y.o.   MRN: [id number]  HPI: Johnny Mathews is a 63 y.o. male presenting on 10/08/2023 for Toes red and swollen (Dropped something on foot 1 week ago)   Discussed the use of AI scribe software for clinical note transcription with the patient, who gave verbal consent to proceed.  History of Present Illness   Johnny Mathews is a 63 year old male who presents with a toe injury after dropping a metal sawhorse on it.  Toe trauma and laceration - Sustained injury to toe one week ago after dropping a heavy metal sawhorse on it while wearing shoes - Laceration located behind the toenail - Initial bleeding at the time of injury - Toenail appears likely to fall off  Signs and symptoms of local infection - Progressive erythema around the injured area over the past week - Throbbing pain in the toe today - No current pus or oozing observed - Vigilant for signs of infection  Specialist communication - Contacted by an ear, nose, and throat doctor several days ago but has not yet followed up          Relevant past medical, surgical, family and social history reviewed and updated as indicated. Interim medical history since our last visit reviewed. Allergies and medications reviewed and updated.  Review of Systems  Constitutional:  Negative for chills and fever.  Respiratory:  Negative for shortness of breath and wheezing.   Cardiovascular:  Negative for chest pain and leg swelling.  Musculoskeletal:  Negative for back pain and gait problem.  Skin:  Positive for color change and wound. Negative for rash.  All other systems reviewed and are negative.   Per HPI unless specifically indicated above   Allergies as of 10/08/2023       Reactions   Egg-derived Products Swelling    Throat- 03-31-2019 pt states can eat cakes and Pie with no issues  Throat- 03-31-2019 pt states can eat cakes and Pie with no issues    Septra [sulfamethoxazole-trimethoprim] Swelling   throat   Sulfonamide Derivatives Hives        Medication List        Accurate as of October 08, 2023  3:58 PM. If you have any questions, ask your nurse or doctor.          albuterol  (2.5 MG/3ML) 0.083% nebulizer solution Commonly known as: PROVENTIL  NEBULIZE 1 VIAL EVERY 6 HOURS AS NEEDED FOR WHEEZING & SHORTNESS OF BREATH   albuterol  108 (90 Base) MCG/ACT inhaler Commonly known as: VENTOLIN  HFA INHALE 2 PUFFS EVERY 4 HOURS AS NEEDED   amitriptyline  100 MG tablet Commonly known as: ELAVIL  TAKE 2 TABLETS BY MOUTH AT BEDTIME   blood glucose meter kit and supplies Dispense based on patient and insurance preference. Use up to four times daily as directed. (FOR ICD-10 E10.9, E11.9). Pt states needs One Touch Verio Meter and one touch delica plus test strips   Breztri  Aerosphere 160-9-4.8 MCG/ACT Aero inhaler Generic drug: budesonide -glycopyrrolate -formoterol  Inhale into the lungs.   budesonide  0.25 MG/2ML nebulizer solution Commonly known as: Pulmicort  One vial twice daily with albuterol  What changed:  how much to take how to take this when  to take this reasons to take this   budesonide -formoterol  80-4.5 MCG/ACT inhaler Commonly known as: Symbicort  Take 2 puffs first thing in am and then another 2 puffs about 12 hours later.   cephALEXin  500 MG capsule Commonly known as: KEFLEX  Take 1 capsule (500 mg total) by mouth 4 (four) times daily. Started by: Fonda LABOR Johnny Mathews   Contour Blood Glucose System w/Device Kit Test blood sugars four times daily   cyclobenzaprine  10 MG tablet Commonly known as: FLEXERIL  Take 1 tablet (10 mg total) by mouth 3 (three) times daily as needed for muscle spasms. Take 1 tablet by mouth 3 times daily as needed for muscle spasm. Warning: May cause  drowsiness.   Eliquis 5 MG Tabs tablet Generic drug: apixaban Take 5 mg by mouth 2 (two) times daily.   EPINEPHrine  0.3 mg/0.3 mL Soaj injection Commonly known as: EPI-PEN INJECT 0.3ML (0.3MG ) IM ONCE   esomeprazole  40 MG capsule Commonly known as: NexIUM  Take 1 capsule (40 mg total) by mouth daily at 12 noon. Take 30-60 min before first meal of the day   famotidine  20 MG tablet Commonly known as: PEPCID  TAKE ONE (1) TABLET BY MOUTH TWO (2) TIMES DAILY   ferrous sulfate  325 (65 FE) MG tablet Take 325 mg by mouth daily with breakfast.   finasteride  5 MG tablet Commonly known as: PROSCAR  TAKE ONE (1) TABLET EACH DAY   fluticasone  50 MCG/ACT nasal spray Commonly known as: FLONASE  USE 1 TO 2 SPRAYS IN EACH NOSTRIL DAILY   furosemide  20 MG tablet Commonly known as: LASIX  Take 1 tablet (20 mg total) by mouth daily as needed. What changed: when to take this   glucose blood test strip Commonly known as: Contour Next Test Test blood sugars four times daily   losartan  50 MG tablet Commonly known as: COZAAR  Take 1 tablet (50 mg total) by mouth daily.   metoprolol succinate 25 MG 24 hr tablet Commonly known as: TOPROL-XL Take 25 mg by mouth 2 (two) times daily.   montelukast  10 MG tablet Commonly known as: SINGULAIR  TAKE ONE TABLET DAILY AT BEDTIME   OneTouch Delica Plus Lancet33G Misc   Ozempic  (1 MG/DOSE) 4 MG/3ML Sopn Generic drug: Semaglutide  (1 MG/DOSE) INJECT 1MG  AS DIRECTED ONCE A WEEK   pravastatin  80 MG tablet Commonly known as: PRAVACHOL  Take 1 tablet (80 mg total) by mouth daily.   Spacer/Aero-Holding [employer] Use with inhaler daily   SYRINGE-NEEDLE (DISP) 3 ML 21G X 1-1/2 3 ML Misc Use to inject testosterone  every week   tadalafil  10 MG tablet Commonly known as: CIALIS  TAKE 1 TABLET BY MOUTH EVERY OTHER DAY AS NEEDED   tamsulosin  0.4 MG Caps capsule Commonly known as: FLOMAX  TAKE 1 CAPSULE DAILY AFTER SUPPER   testosterone  cypionate 200  MG/ML injection Commonly known as: DEPOTESTOSTERONE CYPIONATE INJECT 100MG  (0.5ML) EVERY 10 DAYS (FOR INTRAMUSCULAR USE ONLY)         Objective:   BP (!) 141/81   Pulse 70   Temp 98.5 F (36.9 C) (Temporal)   Ht 5' 8 (1.727 m)   Wt 182 lb (82.6 kg)   SpO2 96%   BMI 27.67 kg/m   Wt Readings from Last 3 Encounters:  10/08/23 182 lb (82.6 kg)  09/28/23 178 lb (80.7 kg)  08/12/23 178 lb (80.7 kg)    Physical Exam Vitals and nursing note reviewed.    Physical Exam   EXTREMITIES: Johnny Mathews behind toenail of big toe. Cut along nail base of big toe.  Decreased sensation behind big toe.  No purulence but does have some surrounding erythema        Assessment & Plan:   Problem List Items Addressed This Visit   None Visit Diagnoses       Pain of toe of left foot    -  Primary   Relevant Medications   cephALEXin  (KEFLEX ) 500 MG capsule   Other Relevant Orders   DG Foot Complete Left     Cellulitis of toe of left foot       Relevant Medications   cephALEXin  (KEFLEX ) 500 MG capsule          Laceration of left great toe with risk of infection Laceration behind toenail from metal impact a week ago. Too late for suturing. Redness suggests early infection. Toenail likely to fall off. No fracture on x-ray. - Prescribed Keflex  500 mg QID for 7 days. - Instructed to wash with soap and water during showers. - Advised to monitor for infection signs and report if present. - Emphasized infection prevention to avoid toe loss.  Pain in left great toe Pain post-injury, associated with laceration and possible infection. No fracture on x-ray.  Follow-up with ear, nose, and throat specialist Follow-up pending due to referral. Contact information needed. - Locate contact information for ENT specialist and follow up.          Follow up plan: Return if symptoms worsen or fail to improve.  Counseling provided for all of the vaccine components Orders Placed This Encounter   Procedures   DG Foot Complete Left    Fonda Levins, MD [facility] 10/08/2023, 3:58 PM

## 2023-10-09 ENCOUNTER — Ambulatory Visit: Payer: Self-pay | Admitting: Family Medicine

## 2023-10-14 ENCOUNTER — Other Ambulatory Visit: Payer: Self-pay | Admitting: Family Medicine

## 2023-10-17 ENCOUNTER — Encounter: Payer: Self-pay | Admitting: Family Medicine

## 2023-10-19 ENCOUNTER — Ambulatory Visit: Payer: Self-pay

## 2023-10-19 ENCOUNTER — Ambulatory Visit (INDEPENDENT_AMBULATORY_CARE_PROVIDER_SITE_OTHER): Admitting: Nurse Practitioner

## 2023-10-19 ENCOUNTER — Encounter: Payer: Self-pay | Admitting: Nurse Practitioner

## 2023-10-19 VITALS — BP 114/75 | HR 108 | Temp 97.7°F | Ht 68.0 in | Wt 180.8 lb

## 2023-10-19 DIAGNOSIS — M79675 Pain in left toe(s): Secondary | ICD-10-CM | POA: Diagnosis not present

## 2023-10-19 DIAGNOSIS — L03032 Cellulitis of left toe: Secondary | ICD-10-CM | POA: Diagnosis not present

## 2023-10-19 NOTE — Telephone Encounter (Signed)
 Seen in office today

## 2023-10-19 NOTE — Telephone Encounter (Signed)
 FYI Only or Action Required?: FYI only for provider.  Patient was last seen in primary care on 10/08/2023 by Dettinger, Fonda LABOR, MD.  Called Nurse Triage reporting Toe Injury.  Symptoms began several days ago.  Interventions attempted: Other: Epsom salt soak.  Symptoms are: stable.  Triage Disposition: See HCP Within 4 Hours (Or PCP Triage)  Patient/caregiver understands and will follow disposition?: Yes Reason for Disposition  [1] MODERATE-SEVERE pain AND [2] blood present under the toenail  Answer Assessment - Initial Assessment Questions Noticed black drainage coming out of it the other day, strong odor. Soaked in epsom salt and that helped some.   1. MECHANISM: How did the injury happen?      Piece of metal fell on foot  2. ONSET: When did the injury happen? (e.g., minutes, hours ago)      A couple weeks ago  3. LOCATION: What part of the toe is injured? Is the nail damaged?      Left great toe  4. APPEARANCE of TOE INJURY: What does the injury look like?      Black stuff coming out of the end of it, states it has an odor  5. SEVERITY: Can you use the foot normally? Can you walk?      Today is the first day experiencing pain  6. SIZE: For cuts, bruises, or swelling, ask: How large is it? (e.g., inches or centimeters;  entire toe)      Swelling when first wakes up  7. PAIN: Is there pain? If Yes, ask: How bad is the pain?   What does it keep you from doing? (Scale 0-10; or none, mild, moderate, severe)     Today is the first day experiencing pain, 4/10  9. DIABETES: Do you have a history of diabetes or poor circulation in the feet?     Type 2 diabetic  10. OTHER SYMPTOMS: Do you have any other symptoms?        Denies  Protocols used: Toe Injury-A-AH  Copied from CRM [id number]. Topic: Clinical - Red Word Triage >> Oct 19, 2023 10:17 AM Donee H wrote: [facility] that prompted transfer to Nurse Triage: Patient states a piece of metal fell on  left foot a couple of weeks ago. He was seen by pcp right after/ He states his foot has now turned  black and it has a horrible smell. He states toes began to turn black about 2 days. Concern and would like to see pcp

## 2023-10-19 NOTE — Progress Notes (Signed)
 Subjective:  Patient ID: Johnny Mathews, male    DOB: [birth year], 63 y.o.   MRN: [id number]  Patient Care Team: Dettinger, Fonda LABOR, MD as PCP - General (Family Medicine) Tobie Tonita POUR, DO as Consulting Physician (Neurology) Darlean Ozell NOVAK, MD as Consulting Physician (Pulmonary Disease)   Chief Complaint:  Toe Injury (Left great toe injury 2 weeks ago, dropped something on it)   HPI: Johnny Mathews is a 63 y.o. male presenting on 10/19/2023 for Toe Injury (Left great toe injury 2 weeks ago, dropped something on it)   Discussed the use of AI scribe software for clinical note transcription with the patient, who gave verbal consent to proceed.  History of Present Illness Johnny Mathews is a 63 year old male who presents with a toe injury and nail discoloration.  He dropped a metal object on his toe, resulting in an injury. Initially, he received antibiotics, which he completed, noting improvement in the condition.  Two days ago, he noticed black oozing from the end of the affected toe and describes it as swollen. He has been soaking the toe in Epsom salt, which he states has helped significantly.  There is a cut on the toe, but it was too late for stitches. The nail is now coming out and appears dark.  No current pain, although he experienced some discomfort this morning. An x-ray performed on October 08, 2023, no acute fractures or dislocations, and there is no sign of arthropathy or other bone abnormalities. The soft tissues appear normal. However, a couple of small, bright spots (punctate hyperdensities) are noted on the lateral and plantar sides of the first distal phalanx, which might suggest the presence of small radiopaque foreign bodies. Clinical correlation is recommended to confirm this.      Relevant past medical, surgical, family, and social history reviewed and updated as indicated.  Allergies and medications reviewed and updated. Data reviewed: Chart in  Epic.   Past Medical History:  Diagnosis Date   Allergy    Alpha galactosidase deficiency    Anemia    past hx    Arthritis    Asthma    Blood transfusion without reported diagnosis    BPH (benign prostatic hypertrophy)    Cataract    removed left eye    Complication of anesthesia    Diabetes mellitus without complication (HCC)    Dysrhythmia    GERD (gastroesophageal reflux disease)    Heart murmur    History of kidney stones    HOH (hard of hearing)    Hyperlipidemia    Hypertension    Irregular heart beat    Muscular dystrophy (HCC)    PONV (postoperative nausea and vomiting)    Sleep apnea    Wears hearing aid in both ears     Past Surgical History:  Procedure Laterality Date   CARDIAC ELECTROPHYSIOLOGY STUDY AND ABLATION  11/2022   CATARACT EXTRACTION Left    COLONOSCOPY     ESOPHAGOGASTRODUODENOSCOPY N/A 08/27/2020   Procedure: ESOPHAGOGASTRODUODENOSCOPY (EGD);  Surgeon: Shyrl Linnie KIDD, MD;  Location: [facility] OR;  Service: Thoracic;  Laterality: N/A;   FEMUR FRACTURE SURGERY     INGUINAL HERNIA REPAIR Right 08/07/2021   Procedure: HERNIA REPAIR INGUINAL ADULT WITH MESH;  Surgeon: Kallie Manuelita BROCKS, MD;  Location: [facility] ORS;  Service: General;  Laterality: Right;   INSERTION OF MESH N/A 08/27/2020   Procedure: INSERTION OF ACELL 7.5 x 6cm GENTRIX SURGICAL MATRIX HIATAL MESH;  Surgeon: Shyrl Linnie KIDD, MD;  Location: [facility] OR;  Service: Thoracic;  Laterality: N/A;   KIDNEY STONE SURGERY     x6   KNEE SURGERY Right    x2   MASS EXCISION Right 08/02/2018   Procedure: EXCISION RIGHT LONG FINGER MASS, DEBRIDEMENT PROXIMAL INTERPHALANGEAL JOINT WITH ROTATION FLAP;  Surgeon: Murrell Drivers, MD;  Location: [facility];  Service: Orthopedics;  Laterality: Right;   NISSEN FUNDOPLICATION     [facility]    SHOULDER OPEN ROTATOR CUFF REPAIR     XI ROBOTIC ASSISTED HIATAL HERNIA REPAIR N/A 08/27/2020   Procedure: XI ROBOTIC ASSISTED REDO HIATAL HERNIA REPAIR;   Surgeon: Shyrl Linnie KIDD, MD;  Location: [facility] OR;  Service: Thoracic;  Laterality: N/A;    Social History   Socioeconomic History   Marital status: Married    Spouse name: Not on file   Number of children: Not on file   Years of education: Not on file   Highest education level: Not on file  Occupational History   Occupation: Designer, industrial/product  Tobacco Use   Smoking status: Former    Current packs/day: 0.00    Average packs/day: 1 pack/day for 10.0 years (10.0 ttl pk-yrs)    Types: Cigarettes    Start date: 01/07/1984    Quit date: 01/06/1994    Years since quitting: 29.8   Smokeless tobacco: Current    Types: Snuff  Vaping Use   Vaping status: Never Used  Substance and Sexual Activity   Alcohol use: Yes    Alcohol/week: 2.0 - 3.0 standard drinks of alcohol    Types: 2 - 3 Standard drinks or equivalent per week    Comment: social   Drug use: No   Sexual activity: Not on file  Other Topics Concern   Not on file  Social History Narrative   Not on file   Social Drivers of Health   Financial Resource Strain: Medium Risk (06/21/2023)   Received from [facility]   Overall Financial Resource Strain (CARDIA)    How hard is it for you to pay for the very basics like food, housing, medical care, and heating?: Somewhat hard  Food Insecurity: No Food Insecurity (06/21/2023)   Received from [facility]   Hunger Vital Sign    Within the past 12 months, you worried that your food would run out before you got the money to buy more.: Never true    Within the past 12 months, the food you bought just didn't last and you didn't have money to get more.: Never true  Transportation Needs: No Transportation Needs (06/21/2023)   Received from [facility]   PRAPARE - Transportation    Lack of Transportation (Medical): No    Lack of Transportation (Non-Medical): No  Physical Activity: Not on file  Stress: Not on file  Social Connections: Not on file  Intimate Partner Violence: Not  on file    Outpatient Encounter Medications as of 10/19/2023  Medication Sig   albuterol  (PROVENTIL ) (2.5 MG/3ML) 0.083% nebulizer solution NEBULIZE 1 VIAL EVERY 6 HOURS AS NEEDED FOR WHEEZING & SHORTNESS OF BREATH   albuterol  (VENTOLIN  HFA) 108 (90 Base) MCG/ACT inhaler INHALE 2 PUFFS EVERY 4 HOURS AS NEEDED   amitriptyline  (ELAVIL ) 100 MG tablet TAKE 2 TABLETS BY MOUTH AT BEDTIME   blood glucose meter kit and supplies Dispense based on patient and insurance preference. Use up to four times daily as directed. (FOR ICD-10 E10.9, E11.9). Pt states needs  One Touch Verio Meter and one touch delica plus test strips   Blood Glucose Monitoring Suppl (CONTOUR BLOOD GLUCOSE SYSTEM) w/Device KIT Test blood sugars four times daily   budesonide  (PULMICORT ) 0.25 MG/2ML nebulizer solution One vial twice daily with albuterol  (Patient taking differently: Take 0.25 mg by nebulization as needed. One vial twice daily with albuterol )   budesonide -formoterol  (SYMBICORT ) 80-4.5 MCG/ACT inhaler Take 2 puffs first thing in am and then another 2 puffs about 12 hours later.   budesonide -glycopyrrolate -formoterol  (BREZTRI  AEROSPHERE) 160-9-4.8 MCG/ACT AERO inhaler Inhale into the lungs.   cyclobenzaprine  (FLEXERIL ) 10 MG tablet Take 1 tablet (10 mg total) by mouth 3 (three) times daily as needed for muscle spasms. Take 1 tablet by mouth 3 times daily as needed for muscle spasm. Warning: May cause drowsiness.   ELIQUIS 5 MG TABS tablet Take 5 mg by mouth 2 (two) times daily.   EPINEPHRINE  0.3 mg/0.3 mL IJ SOAJ injection INJECT 0.3ML (0.3MG ) IM ONCE   esomeprazole  (NEXIUM ) 40 MG capsule Take 1 capsule (40 mg total) by mouth daily at 12 noon. Take 30-60 min before first meal of the day   famotidine  (PEPCID ) 20 MG tablet TAKE ONE (1) TABLET BY MOUTH TWO (2) TIMES DAILY   ferrous sulfate  325 (65 FE) MG tablet Take 325 mg by mouth daily with breakfast.   finasteride  (PROSCAR ) 5 MG tablet TAKE ONE (1) TABLET EACH DAY    fluticasone  (FLONASE ) 50 MCG/ACT nasal spray USE 1 TO 2 SPRAYS IN EACH NOSTRIL DAILY   furosemide  (LASIX ) 20 MG tablet Take 1 tablet (20 mg total) by mouth daily as needed. (Patient taking differently: Take 20 mg by mouth daily.)   glucose blood (CONTOUR NEXT TEST) test strip Test blood sugars four times daily   Lancets (ONETOUCH DELICA PLUS LANCET33G) MISC    losartan  (COZAAR ) 50 MG tablet Take 1 tablet (50 mg total) by mouth daily.   metoprolol succinate (TOPROL-XL) 25 MG 24 hr tablet Take 25 mg by mouth 2 (two) times daily.   montelukast  (SINGULAIR ) 10 MG tablet TAKE ONE TABLET DAILY AT BEDTIME   pravastatin  (PRAVACHOL ) 80 MG tablet Take 1 tablet (80 mg total) by mouth daily.   Semaglutide , 1 MG/DOSE, (OZEMPIC , 1 MG/DOSE,) 4 MG/3ML SOPN INJECT 1MG  AS DIRECTED ONCE A WEEK   Spacer/Aero-Holding Chambers DEVI Use with inhaler daily   SYRINGE-NEEDLE, DISP, 3 ML 21G X 1-1/2 3 ML MISC Use to inject testosterone  every week   tadalafil  (CIALIS ) 10 MG tablet TAKE 1 TABLET BY MOUTH EVERY OTHER DAY AS NEEDED   tamsulosin  (FLOMAX ) 0.4 MG CAPS capsule TAKE 1 CAPSULE DAILY AFTER SUPPER   testosterone  cypionate (DEPOTESTOSTERONE CYPIONATE) 200 MG/ML injection INJECT 100MG  (0.5ML) EVERY 10 DAYS (FOR INTRAMUSCULAR USE ONLY)   cephALEXin  (KEFLEX ) 500 MG capsule Take 1 capsule (500 mg total) by mouth 4 (four) times daily. (Patient not taking: Reported on 10/19/2023)   No facility-administered encounter medications on file as of 10/19/2023.    Allergies  Allergen Reactions   Egg Protein-Containing Drug Products Swelling    Throat- 03-31-2019 pt states can eat cakes and Pie with no issues  Throat- 03-31-2019 pt states can eat cakes and Pie with no issues    Septra [Sulfamethoxazole-Trimethoprim] Swelling    throat   Sulfonamide Derivatives Hives    Pertinent ROS per HPI, otherwise unremarkable      Objective:  BP 114/75   Pulse (!) 108 Comment: pt states he has been in afib for 2 weeks  Temp 97.7 F  (  36.5 C) (Temporal)   Ht 5' 8 (1.727 m)   Wt 180 lb 12.8 oz (82 kg)   SpO2 98%   BMI 27.49 kg/m    Wt Readings from Last 3 Encounters:  10/19/23 180 lb 12.8 oz (82 kg)  10/08/23 182 lb (82.6 kg)  09/28/23 178 lb (80.7 kg)    Physical Exam Vitals and nursing note reviewed.  Constitutional:      General: He is not in acute distress. HENT:     Head: Normocephalic and atraumatic.     Nose: Nose normal.  Eyes:     General: No scleral icterus.    Extraocular Movements: Extraocular movements intact.     Conjunctiva/sclera: Conjunctivae normal.     Pupils: Pupils are equal, round, and reactive to light.  Cardiovascular:     Heart sounds: Normal heart sounds.  Pulmonary:     Effort: Pulmonary effort is normal.     Breath sounds: Normal breath sounds.  Musculoskeletal:        General: Normal range of motion.     Right foot: Normal.     Left foot: No swelling. Normal pulse.       Legs:  Skin:    General: Skin is warm and dry.  Neurological:     Mental Status: He is alert and oriented to person, place, and time.  Psychiatric:        Mood and Affect: Mood normal.        Behavior: Behavior normal.        Thought Content: Thought content normal.        Judgment: Judgment normal.    Physical Exam EXTREMITIES: Toenail detachment with dark discoloration.     Results for orders placed or performed during the hospital encounter of 08/18/23  [facility] Molecular Screen - Blood ([facility])   Collection Time: 08/18/23  8:42 AM  Result Value Ref Range   Genetic Analysis Overall Interpretation Negative    Genetic Disease Assessed      This is a screening test and does not detect all pathogenic or likely pathogenic variant(s) in the tested genes; diagnostic testing is recommended for individuals with a personal or family history of heart disease or hereditary cancer. [employer] [facility]  Population Screen is a screening test that analyzes 11 genes related to hereditary  breast and ovarian cancer (HBOC) syndrome, Lynch syndrome, and familial hypercholesterolemia. This test only reports clinically significant pathogenic and likely  pathogenic variants but does not report variants of uncertain significance (VUS). In addition, analysis of the PMS2 gene excludes exons 11-15, which overlap with a known pseudogene (PMS2CL).    Genetic Analysis Report      No pathogenic or likely pathogenic variants were detected in the genes analyzed by this test.Genetic test results should be interpreted in the context of an individual's personal medical and family history. Alteration to medical management is NOT  recommended based solely on this result. Clinical correlation is advised.Additional Considerations- This is a screening test; individuals may still carry pathogenic or likely pathogenic variant(s) in the tested genes that are not detected by this test.-  For individuals at risk for these or other related conditions based on factors including personal or family history, diagnostic testing is recommended.- The absence of pathogenic or likely pathogenic variant(s) in the analyzed genes, while reassuring,  does not eliminate the possibility of a hereditary condition; there are other variants and genes associated with heart disease and hereditary cancer that are not included  in this test.    Genes Tested See Notes    Disclaimer See Notes    Sequencing Location See Notes    Interpretation Methods and Limitations See Notes        Pertinent labs & imaging results that were available during my care of the patient were reviewed by me and considered in my medical decision making.  Assessment & Plan:  There are no diagnoses linked to this encounter.   Assessment and Plan Johnny Mathews is a 63 year old Caucasian male seen today for left great toe injury, no acute distress Assessment & Plan Traumatic toe injury with nail avulsion Nail avulsion with black discoloration, no infection, new nail  displacing old. X-ray showed no fracture. - Continue Epsom salt soaks. - Refer to podiatry for nail avulsion management.      Continue all other maintenance medications.  Follow up plan: No follow-ups on file.   Continue healthy lifestyle choices, including diet (rich in fruits, vegetables, and lean proteins, and low in salt and simple carbohydrates) and exercise (at least 30 minutes of moderate physical activity daily).  Educational handout given for    Paronychia Paronychia is an infection of the skin that surrounds a nail. It usually affects the skin around a fingernail, but it may also occur near a toenail. It often causes pain and swelling around the nail. In some cases, a collection of pus (abscess) can form near or under the nail.  This condition may develop suddenly, or it may develop gradually over a longer period. In most cases, paronychia is not serious, and it will clear up with treatment. What are the causes? This condition may be caused by bacteria or a fungus, such as yeast. The bacteria or fungus can enter the body through an opening in the skin, such as a cut or a hangnail, and cause an infection in your fingernail or toenail. Other causes may include: Recurrent injury to the fingernail or toenail area. Irritation of the base and sides of the nail (cuticle). Injury and irritation can result in inflammation, swelling, and thickened skin around the nail. What increases the risk? This condition is more likely to develop in people who: Get their hands wet often, such as those who work as Fish farm manager, bartenders, or housekeepers. Bite their fingernails or cuticles. Have underlying skin conditions. Have hangnails or injured fingertips. Are exposed to irritants like detergents and other chemicals. Have diabetes. What are the signs or symptoms? Symptoms of this condition include: Redness and swelling of the skin near the nail. Tenderness around the nail when you touch the  area. Pus-filled bumps under the cuticle. Fluid or pus under the nail. Throbbing pain in the area. How is this diagnosed? This condition is diagnosed with a physical exam. In some cases, a sample of pus may be tested to determine what type of bacteria or fungus is causing the condition. How is this treated? Treatment depends on the cause and severity of your condition. If your condition is mild, it may clear up on its own in a few days or after soaking in warm water. If needed, treatment may include: Antibiotic medicine, if your infection is caused by bacteria. Antifungal medicine, if your infection is caused by a fungus. A procedure to drain pus from an abscess. Anti-inflammatory medicine (corticosteroids). Removal of part of an ingrown toenail. A bandage (dressing) may be placed over the affected area if an abscess or part of a nail has been removed. Follow these instructions at home: Wound care Keep  the affected area clean. Soak the affected area in warm water if told to do so by your health care provider. You may be told to do this for 20 minutes, 2-3 times a day. Keep the area dry when you are not soaking it. Do not try to drain an abscess yourself. Follow instructions from your health care provider about how to take care of the affected area. Make sure you: Wash your hands with soap and water for at least 20 seconds before and after you change your dressing. If soap and water are not available, use hand sanitizer. Change your dressing as told by your health care provider. If you had an abscess drained, check the area every day for signs of infection. Check for: Redness, swelling, or pain. Fluid or blood. Warmth. Pus or a bad smell. Medicines  Take over-the-counter and prescription medicines only as told by your health care provider. If you were prescribed an antibiotic medicine, take it as told by your health care provider. Do not stop taking the antibiotic even if you start to  feel better. General instructions Avoid contact with any skin irritants or allergens. Do not pick at the affected area. Keep all follow-up visits as told. This is important. Prevention To prevent this condition from happening again: Wear rubber gloves when washing dishes or doing other tasks that require your hands to get wet. Wear gloves if your hands might come in contact with cleaners or other chemicals. Avoid injuring your nails or fingertips. Do not bite your nails or tear hangnails. Do not cut your nails very short. Do not cut your cuticles. Use clean nail clippers or scissors when trimming nails. Contact a health care provider if: Your symptoms get worse or do not improve with treatment. You have continued or increased fluid, blood, or pus coming from the affected area. Your affected finger, toe, or joint becomes swollen or difficult to move. You have a fever or chills. There is redness spreading away from the affected area. Summary Paronychia is an infection of the skin that surrounds a nail. It often causes pain and swelling around the nail. In some cases, a collection of pus (abscess) can form near or under the nail. This condition may be caused by bacteria or a fungus. These germs can enter the body through an opening in the skin, such as a cut or a hangnail. If your condition is mild, it may clear up on its own in a few days. If needed, treatment may include medicine or a procedure to drain pus from an abscess. To prevent this condition from happening again, wear gloves if doing tasks that require your hands to get wet or to come in contact with chemicals. Also avoid injuring your nails or fingertips. This information is not intended to replace advice given to you by your health care provider. Make sure you discuss any questions you have with your health care provider. Document Revised: 03/26/2020 Document Reviewed: 03/26/2020 Elsevier Patient Education  2024 [employer]     The above assessment and management plan was discussed with the patient. The patient verbalized understanding of and has agreed to the management plan. Patient is aware to call the clinic if they develop any new symptoms or if symptoms persist or worsen. Patient is aware when to return to the clinic for a follow-up visit. Patient educated on when it is appropriate to go to the emergency department.   Abygale Karpf [place] Thompson, DNP [facility] [address]  [address] [place], [place] [address] [phone]

## 2023-10-20 ENCOUNTER — Ambulatory Visit: Attending: Family Medicine | Admitting: Physical Therapy

## 2023-10-20 ENCOUNTER — Other Ambulatory Visit: Payer: Self-pay

## 2023-10-20 DIAGNOSIS — M6281 Muscle weakness (generalized): Secondary | ICD-10-CM | POA: Diagnosis present

## 2023-10-20 DIAGNOSIS — M25512 Pain in left shoulder: Secondary | ICD-10-CM | POA: Insufficient documentation

## 2023-10-20 DIAGNOSIS — M5459 Other low back pain: Secondary | ICD-10-CM | POA: Diagnosis present

## 2023-10-20 DIAGNOSIS — G8929 Other chronic pain: Secondary | ICD-10-CM | POA: Diagnosis present

## 2023-10-20 DIAGNOSIS — M62838 Other muscle spasm: Secondary | ICD-10-CM | POA: Insufficient documentation

## 2023-10-20 NOTE — Therapy (Signed)
 OUTPATIENT PHYSICAL THERAPY SHOULDER EVALUATION   Patient Name: Johnny Mathews MRN: [id number] DOB:06-28-1960, 63 y.o., male Today's Date: 10/20/2023  END OF SESSION:  PT End of Session - 10/20/23 1037     Visit Number 1    Number of Visits 12    Date for Recertification  12/01/23    PT Start Time 0935    PT Stop Time 1024    PT Time Calculation (min) 49 min          Past Medical History:  Diagnosis Date   Allergy    Alpha galactosidase deficiency    Anemia    past hx    Arthritis    Asthma    Blood transfusion without reported diagnosis    BPH (benign prostatic hypertrophy)    Cataract    removed left eye    Complication of anesthesia    Diabetes mellitus without complication (HCC)    Dysrhythmia    GERD (gastroesophageal reflux disease)    Heart murmur    History of kidney stones    HOH (hard of hearing)    Hyperlipidemia    Hypertension    Irregular heart beat    Muscular dystrophy (HCC)    PONV (postoperative nausea and vomiting)    Sleep apnea    Wears hearing aid in both ears    Past Surgical History:  Procedure Laterality Date   CARDIAC ELECTROPHYSIOLOGY STUDY AND ABLATION  11/2022   CATARACT EXTRACTION Left    COLONOSCOPY     ESOPHAGOGASTRODUODENOSCOPY N/A 08/27/2020   Procedure: ESOPHAGOGASTRODUODENOSCOPY (EGD);  Surgeon: Shyrl Linnie KIDD, MD;  Location: [facility] OR;  Service: Thoracic;  Laterality: N/A;   FEMUR FRACTURE SURGERY     INGUINAL HERNIA REPAIR Right 08/07/2021   Procedure: HERNIA REPAIR INGUINAL ADULT WITH MESH;  Surgeon: Kallie Manuelita BROCKS, MD;  Location: [facility];  Service: General;  Laterality: Right;   INSERTION OF MESH N/A 08/27/2020   Procedure: INSERTION OF ACELL 7.5 x 6cm GENTRIX SURGICAL MATRIX HIATAL MESH;  Surgeon: Shyrl Linnie KIDD, MD;  Location: [facility] OR;  Service: Thoracic;  Laterality: N/A;   KIDNEY STONE SURGERY     x6   KNEE SURGERY Right    x2   MASS EXCISION Right 08/02/2018   Procedure: EXCISION RIGHT LONG FINGER  MASS, DEBRIDEMENT PROXIMAL INTERPHALANGEAL JOINT WITH ROTATION FLAP;  Surgeon: Murrell Drivers, MD;  Location: [facility];  Service: Orthopedics;  Laterality: Right;   NISSEN FUNDOPLICATION     [facility]    SHOULDER OPEN ROTATOR CUFF REPAIR     XI ROBOTIC ASSISTED HIATAL HERNIA REPAIR N/A 08/27/2020   Procedure: XI ROBOTIC ASSISTED REDO HIATAL HERNIA REPAIR;  Surgeon: Shyrl Linnie KIDD, MD;  Location: [facility] OR;  Service: Thoracic;  Laterality: N/A;   Patient Active Problem List   Diagnosis Date Noted   AC joint arthropathy 08/05/2023   Impingement of left shoulder 08/05/2023   Laceration of left thumb without foreign body without damage to nail 04/21/2023   Pulmonary infiltrates on CXR 04/09/2023   Myopathy 03/20/2023   Asthma 03/20/2023   Recurrent right inguinal hernia    PAF (paroxysmal atrial fibrillation) (HCC) 01/17/2021   Status post placement of implantable loop recorder 12/10/2020   Hiatal hernia 08/27/2020   Anemia 04/05/2020   BPH (benign prostatic hyperplasia) 11/09/2019   Aortic valve stenosis 07/19/2019   Coronary artery disease of native artery of native heart with stable angina pectoris 04/18/2019   Anginal equivalent 01/18/2019   Chronic heart  failure with preserved ejection fraction (HCC) 01/18/2019   DOE (dyspnea on exertion) 01/18/2019   Osteoarthritis of finger of right hand 07/21/2018   Hypogonadism, male 03/23/2018   Allergy to meat 06/21/2015   Hypertension associated with diabetes (HCC) 02/05/2015   Sciatica of left side 10/02/2014   Mitochondrial myopathy 07/25/2014   GERD (gastroesophageal reflux disease) 12/23/2012   Hyperlipidemia associated with type 2 diabetes mellitus (HCC) 12/23/2012   Diabetes mellitus, stable (HCC) 12/23/2012   Asthma, mild intermittent 12/23/2012   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  THERAPY DIAG:  Chronic left shoulder  pain  Muscle weakness (generalized)  Rationale for Evaluation and Treatment: Rehabilitation  ONSET DATE: Ongoing.    SUBJECTIVE:                                                                                                                                                                                      SUBJECTIVE STATEMENT: The patient presents to the clinic with c/o chronic left shoulder pain.  He enjoys working out at a gym but states pushing exercises increases his pain but pulling is fine.  He reports after doing pushing exercises he can hardly use his left arm.    PERTINENT HISTORY: MD.  Please see above.    PAIN:  Are you having pain? Yes: NPRS scale: 6/10. Pain location: Left shoulder. Pain description: Cramping and shooting.   Aggravating factors: As above. Relieving factors: Rest.    PRECAUTIONS: None  RED FLAGS: None   WEIGHT BEARING RESTRICTIONS: No  FALLS:  Has patient fallen in last 6 months? No  LIVING ENVIRONMENT: Lives in: House/apartment Has following equipment at home: None  OCCUPATION: Retired.    PLOF: Independent  PATIENT GOALS:Use left shoulder with less pain.    NEXT MD VISIT:   OBJECTIVE:   PATIENT SURVEYS:  Quick DASH:  36 points/56.81.  POSTURE: Pronounced rounded shoulder posture and it appears increased left anterior humeral head migration.  UPPER EXTREMITY ROM:  Full active range of motion.  UPPER EXTREMITY MMT:  Left middle deltoid 4+/5, IR is 4+/5 and ER is 4/5.    SHOULDER SPECIAL TESTS: (-) Drop arm test, no pain reproduction with Impingement testing.  PALPATION:  TTP left acromial ridge and ACJ region.  TREATMENT DATE: 10/20/23;  HMP and IFC at 80-150 Hz on 40% scan x 15 minutes f/b UBE x 8 minutes (4 minutes forward and 4 minutes backward).  Normal modality response.   PATIENT  EDUCATION: Education details: Discussed prognosis and potential exercise progression. Person educated: Patient Education method: Explanation Education comprehension: verbalized understanding  HOME EXERCISE PROGRAM:   ASSESSMENT:  CLINICAL IMPRESSION: The patient presents to OPPT with c/o chronic left shoulder pain that is aggravated with pushing activities.  He has full active range of motion.  He has a pronounced rounded shoulder posture.  He is notable weak into left shoulder ER.  His Quick DASH is 36 points/56.81%.  Patient will benefit from skilled physical therapy intervention to address pain and deficits.  OBJECTIVE IMPAIRMENTS: decreased activity tolerance, decreased strength, increased muscle spasms, and pain.   ACTIVITY LIMITATIONS: Pushing.  PARTICIPATION LIMITATIONS: Gym program.   PERSONAL FACTORS: Time since onset of injury/illness/exacerbation are also affecting patient's functional outcome.   REHAB POTENTIAL: Good  CLINICAL DECISION MAKING: Stable/uncomplicated  EVALUATION COMPLEXITY: Low   GOALS:  SHORT TERM GOALS: Target date: 11/03/23  Ind with an initial HEP. Goal status: INITIAL    LONG TERM GOALS: Target date: 12/01/23  Ind with an advanced HEP.  Goal status: INITIAL  2.  Increase shoulder strength to a solid 5/5 to increase stability for performance of functional activities.  Goal status: INITIAL  3.  Perform regular gym workout with pain not > 3/10.  Goal status: INITIAL  4.  Improve Quick DASH score by at least 5 points.  Goal status: INITIAL  PLAN:  PT FREQUENCY: 2x/week  PT DURATION: 6 weeks  PLANNED INTERVENTIONS: 97110-Therapeutic exercises, 97530- Therapeutic activity, [id number]- Neuromuscular re-education, 97535- Self Care, 02859- Manual therapy, G0283- Electrical stimulation (unattended), 97016- Vasopneumatic device, 97035- Ultrasound, Patient/Family education, Cryotherapy, and Moist heat  PLAN FOR NEXT SESSION: UBE, scapular  exercises, RW4, PRE.  Modalities as needed.   Drayce Tawil, [place], PT 10/20/2023, 11:43 AM

## 2023-10-21 ENCOUNTER — Ambulatory Visit (INDEPENDENT_AMBULATORY_CARE_PROVIDER_SITE_OTHER): Admitting: Otolaryngology

## 2023-10-21 ENCOUNTER — Encounter (INDEPENDENT_AMBULATORY_CARE_PROVIDER_SITE_OTHER): Payer: Self-pay | Admitting: Otolaryngology

## 2023-10-21 VITALS — BP 124/88 | HR 132

## 2023-10-21 DIAGNOSIS — Z09 Encounter for follow-up examination after completed treatment for conditions other than malignant neoplasm: Secondary | ICD-10-CM

## 2023-10-21 DIAGNOSIS — K140 Glossitis: Secondary | ICD-10-CM

## 2023-10-21 DIAGNOSIS — Z95818 Presence of other cardiac implants and grafts: Secondary | ICD-10-CM | POA: Diagnosis not present

## 2023-10-21 NOTE — Progress Notes (Signed)
 ENT CONSULT:  Reason for Consult: tongue ulcer   HPI: Discussed the use of AI scribe software for clinical note transcription with the patient, who gave verbal consent to proceed.  History of Present Illness Johnny Mathews is a 63 year old male who presents with a history of oral ulceration on his tongue. He was referred by his primary care doctor for evaluation of an oral ulcer.  He initially noticed an ulcer on the top of his tongue about a month ago. The ulcer has since resolved and is no longer present.  He experiences shortness of breath. He has a history of atrial fibrillation and has undergone ablation procedures twice. Established with cardiology.   No history of smoking.     Past Medical History:  Diagnosis Date   Allergy    Alpha galactosidase deficiency    Anemia    past hx    Arthritis    Asthma    Blood transfusion without reported diagnosis    BPH (benign prostatic hypertrophy)    Cataract    removed left eye    Complication of anesthesia    Diabetes mellitus without complication (HCC)    Dysrhythmia    GERD (gastroesophageal reflux disease)    Heart murmur    History of kidney stones    HOH (hard of hearing)    Hyperlipidemia    Hypertension    Irregular heart beat    Muscular dystrophy (HCC)    PONV (postoperative nausea and vomiting)    Sleep apnea    Wears hearing aid in both ears     Past Surgical History:  Procedure Laterality Date   CARDIAC ELECTROPHYSIOLOGY STUDY AND ABLATION  11/2022   CATARACT EXTRACTION Left    COLONOSCOPY     ESOPHAGOGASTRODUODENOSCOPY N/A 08/27/2020   Procedure: ESOPHAGOGASTRODUODENOSCOPY (EGD);  Surgeon: Shyrl Linnie KIDD, MD;  Location: [facility] OR;  Service: Thoracic;  Laterality: N/A;   FEMUR FRACTURE SURGERY     INGUINAL HERNIA REPAIR Right 08/07/2021   Procedure: HERNIA REPAIR INGUINAL ADULT WITH MESH;  Surgeon: Kallie Manuelita BROCKS, MD;  Location: [facility];  Service: General;  Laterality: Right;   INSERTION OF MESH  N/A 08/27/2020   Procedure: INSERTION OF ACELL 7.5 x 6cm GENTRIX SURGICAL MATRIX HIATAL MESH;  Surgeon: Shyrl Linnie KIDD, MD;  Location: [facility] OR;  Service: Thoracic;  Laterality: N/A;   KIDNEY STONE SURGERY     x6   KNEE SURGERY Right    x2   MASS EXCISION Right 08/02/2018   Procedure: EXCISION RIGHT LONG FINGER MASS, DEBRIDEMENT PROXIMAL INTERPHALANGEAL JOINT WITH ROTATION FLAP;  Surgeon: Murrell Drivers, MD;  Location: [facility];  Service: Orthopedics;  Laterality: Right;   NISSEN FUNDOPLICATION     [facility]    SHOULDER OPEN ROTATOR CUFF REPAIR     XI ROBOTIC ASSISTED HIATAL HERNIA REPAIR N/A 08/27/2020   Procedure: XI ROBOTIC ASSISTED REDO HIATAL HERNIA REPAIR;  Surgeon: Shyrl Linnie KIDD, MD;  Location: [facility] OR;  Service: Thoracic;  Laterality: N/A;    Family History  Problem Relation Age of Onset   Hypertension Mother    Breast cancer Mother    Heart disease Father    Breast cancer Sister    Colon cancer Neg Hx    Colon polyps Neg Hx    Esophageal cancer Neg Hx    Rectal cancer Neg Hx    Stomach cancer Neg Hx     Social History:  reports that he quit smoking about 29 years  ago. His smoking use included cigarettes. He started smoking about 39 years ago. He has a 10 pack-year smoking history. His smokeless tobacco use includes snuff. He reports current alcohol use of about 2.0 - 3.0 standard drinks of alcohol per week. He reports that he does not use drugs.  Allergies:  Allergies  Allergen Reactions   Egg Protein-Containing Drug Products Swelling    Throat- 03-31-2019 pt states can eat cakes and Pie with no issues  Throat- 03-31-2019 pt states can eat cakes and Pie with no issues    Septra [Sulfamethoxazole-Trimethoprim] Swelling    throat   Sulfonamide Derivatives Hives    Medications: I have reviewed the patient's current medications.  The PMH, PSH, Medications, Allergies, and SH were reviewed and updated.  ROS: Constitutional: Negative for fever,  weight loss and weight gain. Cardiovascular: Negative for chest pain and dyspnea on exertion. Respiratory: Is not experiencing shortness of breath at rest. Gastrointestinal: Negative for nausea and vomiting. Neurological: Negative for headaches. Psychiatric: The patient is not nervous/anxious  Blood pressure 124/88, pulse (!) 132, SpO2 96%.  PHYSICAL EXAM:  Exam: General: Well-developed, well-nourished Respiratory Respiratory effort: Equal inspiration and expiration without stridor Cardiovascular Peripheral Vascular: Warm extremities with equal color/perfusion Eyes: No nystagmus with equal extraocular motion bilaterally Neuro/Psych/Balance: Patient oriented to person, place, and time; Appropriate mood and affect; Gait is intact with no imbalance; Cranial nerves I-XII are intact Head and Face Inspection: Normocephalic and atraumatic without mass or lesion Palpation: Facial skeleton intact without bony stepoffs Salivary Glands: No mass or tenderness Facial Strength: Facial motility symmetric and full bilaterally ENT Pinna: External ear intact and fully developed External canal: Canal is patent with intact skin Tympanic Membrane: Clear and mobile External Nose: No scar or anatomic deformity Internal Nose: Septum intact and midline. No edema, polyp, or rhinorrhea Lips, Teeth, and gums: Mucosa and teeth intact and viable TMJ: No pain to palpation with full mobility Oral cavity/oropharynx: No erythema or exudate, no lesions present Neck Neck and Trachea: Midline trachea without mass or lesion Thyroid : No mass or nodularity Lymphatics: No lymphadenopathy  Assessment/Plan: Encounter Diagnosis  Name Primary?   Tongue ulcer Yes    Assessment and Plan Assessment & Plan Oral ulcer, resolved Resolved oral ulcer, was on the dorsal surface of the tongue with no current lesions on exam - patient was reassured  Dyspnea We discussed that he should address it with PCP and make an appt  with  his Cardiologist. We also discussed that he might need to see Pulmonary.  - Contact primary care physician for evaluation and management of dyspnea. - Contact Cardiology     Thank you for allowing me to participate in the care of this patient. Please do not hesitate to contact me with any questions or concerns.   Elena Larry, MD Otolaryngology [facility] Phone: [phone] Fax: [phone]    10/21/2023, 3:56 PM

## 2023-10-22 ENCOUNTER — Ambulatory Visit: Admitting: *Deleted

## 2023-10-22 DIAGNOSIS — G8929 Other chronic pain: Secondary | ICD-10-CM

## 2023-10-22 DIAGNOSIS — M25512 Pain in left shoulder: Secondary | ICD-10-CM | POA: Diagnosis not present

## 2023-10-22 DIAGNOSIS — M6281 Muscle weakness (generalized): Secondary | ICD-10-CM

## 2023-10-22 NOTE — Therapy (Signed)
 OUTPATIENT PHYSICAL THERAPY SHOULDER EVALUATION   Patient Name: Johnny Mathews MRN: [id number] DOB:12-May-1960, 63 y.o., male Today's Date: 10/22/2023  END OF SESSION:  PT End of Session - 10/22/23 0852     Visit Number 2    Number of Visits 12    Date for Recertification  12/01/23    PT Start Time 0845    PT Stop Time 0945    PT Time Calculation (min) 60 min          Past Medical History:  Diagnosis Date   Allergy    Alpha galactosidase deficiency    Anemia    past hx    Arthritis    Asthma    Blood transfusion without reported diagnosis    BPH (benign prostatic hypertrophy)    Cataract    removed left eye    Complication of anesthesia    Diabetes mellitus without complication (HCC)    Dysrhythmia    GERD (gastroesophageal reflux disease)    Heart murmur    History of kidney stones    HOH (hard of hearing)    Hyperlipidemia    Hypertension    Irregular heart beat    Muscular dystrophy (HCC)    PONV (postoperative nausea and vomiting)    Sleep apnea    Wears hearing aid in both ears    Past Surgical History:  Procedure Laterality Date   CARDIAC ELECTROPHYSIOLOGY STUDY AND ABLATION  11/2022   CATARACT EXTRACTION Left    COLONOSCOPY     ESOPHAGOGASTRODUODENOSCOPY N/A 08/27/2020   Procedure: ESOPHAGOGASTRODUODENOSCOPY (EGD);  Surgeon: Shyrl Linnie KIDD, MD;  Location: [facility] OR;  Service: Thoracic;  Laterality: N/A;   FEMUR FRACTURE SURGERY     INGUINAL HERNIA REPAIR Right 08/07/2021   Procedure: HERNIA REPAIR INGUINAL ADULT WITH MESH;  Surgeon: Kallie Manuelita BROCKS, MD;  Location: [facility];  Service: General;  Laterality: Right;   INSERTION OF MESH N/A 08/27/2020   Procedure: INSERTION OF ACELL 7.5 x 6cm GENTRIX SURGICAL MATRIX HIATAL MESH;  Surgeon: Shyrl Linnie KIDD, MD;  Location: [facility] OR;  Service: Thoracic;  Laterality: N/A;   KIDNEY STONE SURGERY     x6   KNEE SURGERY Right    x2   MASS EXCISION Right 08/02/2018   Procedure: EXCISION RIGHT LONG FINGER  MASS, DEBRIDEMENT PROXIMAL INTERPHALANGEAL JOINT WITH ROTATION FLAP;  Surgeon: Murrell Drivers, MD;  Location: [facility];  Service: Orthopedics;  Laterality: Right;   NISSEN FUNDOPLICATION     [facility]    SHOULDER OPEN ROTATOR CUFF REPAIR     XI ROBOTIC ASSISTED HIATAL HERNIA REPAIR N/A 08/27/2020   Procedure: XI ROBOTIC ASSISTED REDO HIATAL HERNIA REPAIR;  Surgeon: Shyrl Linnie KIDD, MD;  Location: [facility] OR;  Service: Thoracic;  Laterality: N/A;   Patient Active Problem List   Diagnosis Date Noted   AC joint arthropathy 08/05/2023   Impingement of left shoulder 08/05/2023   Laceration of left thumb without foreign body without damage to nail 04/21/2023   Pulmonary infiltrates on CXR 04/09/2023   Myopathy 03/20/2023   Asthma 03/20/2023   Recurrent right inguinal hernia    PAF (paroxysmal atrial fibrillation) (HCC) 01/17/2021   Status post placement of implantable loop recorder 12/10/2020   Hiatal hernia 08/27/2020   Anemia 04/05/2020   BPH (benign prostatic hyperplasia) 11/09/2019   Aortic valve stenosis 07/19/2019   Coronary artery disease of native artery of native heart with stable angina pectoris 04/18/2019   Anginal equivalent 01/18/2019   Chronic heart  failure with preserved ejection fraction (HCC) 01/18/2019   DOE (dyspnea on exertion) 01/18/2019   Osteoarthritis of finger of right hand 07/21/2018   Hypogonadism, male 03/23/2018   Allergy to meat 06/21/2015   Hypertension associated with diabetes (HCC) 02/05/2015   Sciatica of left side 10/02/2014   Mitochondrial myopathy 07/25/2014   GERD (gastroesophageal reflux disease) 12/23/2012   Hyperlipidemia associated with type 2 diabetes mellitus (HCC) 12/23/2012   Diabetes mellitus, stable (HCC) 12/23/2012   Asthma, mild intermittent 12/23/2012   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  THERAPY DIAG:  Chronic left shoulder  pain  Muscle weakness (generalized)  Rationale for Evaluation and Treatment: Rehabilitation  ONSET DATE: Ongoing.    SUBJECTIVE:                                                                                                                                                                                      SUBJECTIVE STATEMENT: The patient presents to the clinic with c/o chronic left shoulder pain.  4/10 today    PERTINENT HISTORY: MD.  Please see above.    PAIN:  Are you having pain? Yes: NPRS scale: 4/10. Pain location: Left shoulder. Pain description: Cramping and shooting.   Aggravating factors: As above. Relieving factors: Rest.    PRECAUTIONS: None  RED FLAGS: None   WEIGHT BEARING RESTRICTIONS: No  FALLS:  Has patient fallen in last 6 months? No  LIVING ENVIRONMENT: Lives in: House/apartment Has following equipment at home: None  OCCUPATION: Retired.    PLOF: Independent  PATIENT GOALS:Use left shoulder with less pain.    NEXT MD VISIT:   OBJECTIVE:   PATIENT SURVEYS:  Quick DASH:  36 points/56.81.  POSTURE: Pronounced rounded shoulder posture and it appears increased left anterior humeral head migration.  UPPER EXTREMITY ROM:  Full active range of motion.  UPPER EXTREMITY MMT:  Left middle deltoid 4+/5, IR is 4+/5 and ER is 4/5.    SHOULDER SPECIAL TESTS: (-) Drop arm test, no pain reproduction with Impingement testing.  PALPATION:  TTP left acromial ridge and ACJ region.  TREATMENT DATE: 10/22/23;                                     EXERCISE LOG   LT shldr  Exercise Repetitions and Resistance Comments  UBE  X 6 mins 90 RPMs   RW4 (no punch added EXT) 2x 10 each  , isometric walk out with ER   Supine CW/CCW at 90 degrees 3x 10each            Blank cell = exercise not performed today  Manual Rhythmic stab  for elevation at many angles as well as IR and ER HMP and IFC at 80-150 Hz on 40% scan x 15 minutes     PATIENT EDUCATION: Education details: Discussed prognosis and potential exercise progression. Person educated: Patient Education method: Explanation Education comprehension: verbalized understanding  HOME EXERCISE PROGRAM:   ASSESSMENT:  CLINICAL IMPRESSION: The patient presents to OPPT with c/o chronic left shoulder pain that is aggravated with pushing activities.  Rx focused on low level strengthening exs avoiding pushing and pain as well as manual PROM and rhytmic stab act's. Manual STW to posterior cuff. ER weakness noted     OBJECTIVE IMPAIRMENTS: decreased activity tolerance, decreased strength, increased muscle spasms, and pain.   ACTIVITY LIMITATIONS: Pushing.  PARTICIPATION LIMITATIONS: Gym program.   PERSONAL FACTORS: Time since onset of injury/illness/exacerbation are also affecting patient's functional outcome.   REHAB POTENTIAL: Good  CLINICAL DECISION MAKING: Stable/uncomplicated  EVALUATION COMPLEXITY: Low   GOALS:  SHORT TERM GOALS: Target date: 11/03/23  Ind with an initial HEP. Goal status: INITIAL    LONG TERM GOALS: Target date: 12/01/23  Ind with an advanced HEP.  Goal status: INITIAL  2.  Increase shoulder strength to a solid 5/5 to increase stability for performance of functional activities.  Goal status: INITIAL  3.  Perform regular gym workout with pain not > 3/10.  Goal status: INITIAL  4.  Improve Quick DASH score by at least 5 points.  Goal status: INITIAL  PLAN:  PT FREQUENCY: 2x/week  PT DURATION: 6 weeks  PLANNED INTERVENTIONS: 97110-Therapeutic exercises, 97530- Therapeutic activity, [id number]- Neuromuscular re-education, 97535- Self Care, 02859- Manual therapy, G0283- Electrical stimulation (unattended), 97016- Vasopneumatic device, 97035- Ultrasound, Patient/Family education, Cryotherapy, and Moist heat  PLAN FOR NEXT  SESSION: UBE, scapular exercises, RW4, PRE.  Modalities as needed.   Thedford Bunton,CHRIS, PTA 10/22/2023, 10:55 AM

## 2023-10-23 NOTE — Progress Notes (Signed)
 On review of patient remote, it appears patient is having an increase in day and night HR this month.   Presenting EGM:     Called patient; he states he can feel his heart racing. He is currently taking Metroprolol succinate 25 mg BID,  Apixaban 5 mg BID, and Diltiazem  60 mg QID.  Patient is also s/p cardioversion on 01/03/2022.   ACTION: Provider notified. MS Syed; NPs Armbruster and Anzinger.

## 2023-10-26 ENCOUNTER — Emergency Department (HOSPITAL_COMMUNITY)
Admission: EM | Admit: 2023-10-26 | Discharge: 2023-10-26 | Disposition: A | Discharge status: Home / Self Care | Source: Ambulatory Visit | Attending: Emergency Medicine | Admitting: Emergency Medicine

## 2023-10-26 ENCOUNTER — Other Ambulatory Visit: Payer: Self-pay

## 2023-10-26 ENCOUNTER — Encounter (HOSPITAL_COMMUNITY): Payer: Self-pay

## 2023-10-26 ENCOUNTER — Emergency Department (HOSPITAL_COMMUNITY)

## 2023-10-26 DIAGNOSIS — J45909 Unspecified asthma, uncomplicated: Secondary | ICD-10-CM | POA: Diagnosis not present

## 2023-10-26 DIAGNOSIS — I4892 Unspecified atrial flutter: Secondary | ICD-10-CM | POA: Diagnosis not present

## 2023-10-26 DIAGNOSIS — Z8616 Personal history of COVID-19: Secondary | ICD-10-CM | POA: Diagnosis not present

## 2023-10-26 DIAGNOSIS — R0789 Other chest pain: Secondary | ICD-10-CM | POA: Diagnosis present

## 2023-10-26 DIAGNOSIS — Z7901 Long term (current) use of anticoagulants: Secondary | ICD-10-CM | POA: Insufficient documentation

## 2023-10-26 DIAGNOSIS — Z79899 Other long term (current) drug therapy: Secondary | ICD-10-CM | POA: Diagnosis not present

## 2023-10-26 HISTORY — DX: Unspecified atrial fibrillation: I48.91

## 2023-10-26 LAB — CBC WITH DIFFERENTIAL/PLATELET
Abs Immature Granulocytes: 0.04 K/uL (ref 0.00–0.07)
Basophils Absolute: 0 K/uL (ref 0.0–0.1)
Basophils Relative: 1 %
Eosinophils Absolute: 0.5 K/uL (ref 0.0–0.5)
Eosinophils Relative: 9 %
HCT: 33.7 % — ABNORMAL LOW (ref 39.0–52.0)
Hemoglobin: 10.9 g/dL — ABNORMAL LOW (ref 13.0–17.0)
Immature Granulocytes: 1 %
Lymphocytes Relative: 21 %
Lymphs Abs: 1.3 K/uL (ref 0.7–4.0)
MCH: 32.3 pg (ref 26.0–34.0)
MCHC: 32.3 g/dL (ref 30.0–36.0)
MCV: 100 fL (ref 80.0–100.0)
Monocytes Absolute: 0.5 K/uL (ref 0.1–1.0)
Monocytes Relative: 9 %
Neutro Abs: 3.6 K/uL (ref 1.7–7.7)
Neutrophils Relative %: 59 %
Platelets: 222 K/uL (ref 150–400)
RBC: 3.37 MIL/uL — ABNORMAL LOW (ref 4.22–5.81)
RDW: 13.4 % (ref 11.5–15.5)
WBC: 6 K/uL (ref 4.0–10.5)
nRBC: 0 % (ref 0.0–0.2)

## 2023-10-26 LAB — COMPREHENSIVE METABOLIC PANEL WITH GFR
ALT: 37 U/L (ref 0–44)
AST: 35 U/L (ref 15–41)
Albumin: 4.3 g/dL (ref 3.5–5.0)
Alkaline Phosphatase: 159 U/L — ABNORMAL HIGH (ref 38–126)
Anion gap: 10 (ref 5–15)
BUN: 22 mg/dL (ref 8–23)
CO2: 25 mmol/L (ref 22–32)
Calcium: 8.9 mg/dL (ref 8.9–10.3)
Chloride: 106 mmol/L (ref 98–111)
Creatinine, Ser: 1.44 mg/dL — ABNORMAL HIGH (ref 0.61–1.24)
GFR, Estimated: 54 mL/min — ABNORMAL LOW (ref >60)
Glucose, Bld: 86 mg/dL (ref 70–99)
Potassium: 4.6 mmol/L (ref 3.5–5.1)
Sodium: 141 mmol/L (ref 135–145)
Total Bilirubin: 0.3 mg/dL (ref 0.0–1.2)
Total Protein: 6.9 g/dL (ref 6.5–8.1)

## 2023-10-26 LAB — TSH: TSH: 1.33 u[IU]/mL (ref 0.350–4.500)

## 2023-10-26 MED ORDER — PROPOFOL 10 MG/ML IV BOLUS
INTRAVENOUS | Status: AC | PRN
  Administered 2023-10-26: 9 mg via INTRAVENOUS
  Administered 2023-10-26: 20 mg via INTRAVENOUS

## 2023-10-26 MED ORDER — PROPOFOL 10 MG/ML IV BOLUS
0.5000 mg/kg | Freq: Once | INTRAVENOUS | Status: AC
  Administered 2023-10-26: 41 mg via INTRAVENOUS
  Filled 2023-10-26: qty 20

## 2023-10-26 MED ORDER — MIDAZOLAM HCL (PF) 2 MG/2ML IJ SOLN
2.0000 mg | Freq: Once | INTRAMUSCULAR | Status: AC
  Administered 2023-10-26: 2 mg via INTRAVENOUS
  Filled 2023-10-26: qty 2

## 2023-10-26 MED ORDER — FENTANYL CITRATE (PF) 100 MCG/2ML IJ SOLN
50.0000 ug | Freq: Once | INTRAMUSCULAR | Status: AC
  Administered 2023-10-26: 50 ug via INTRAVENOUS
  Filled 2023-10-26: qty 2

## 2023-10-26 MED ORDER — DILTIAZEM HCL 25 MG/5ML IV SOLN
20.0000 mg | Freq: Once | INTRAVENOUS | Status: AC
  Administered 2023-10-26: 20 mg via INTRAVENOUS
  Filled 2023-10-26: qty 5

## 2023-10-26 NOTE — ED Notes (Signed)
 Consent was signed for cardioversion

## 2023-10-26 NOTE — ED Triage Notes (Signed)
 Pt arrived via POV from home after calling his PCP and being advised to go to the nearest ER for evaluation of possible A-Fib. Pt reports when he woke up this morning, his HR was 140 BPM. Pt denies current CP. Pt endorses feeling SOB as well.

## 2023-10-26 NOTE — Discharge Instructions (Signed)
 Your testing was reassuring, your heart rate corrected with the shock, please make sure you are taking your medications exactly as prescribed including your Eliquis and your metoprolol and follow-up with your cardiologist this week.  ER for worsening symptoms

## 2023-10-26 NOTE — ED Notes (Signed)
Pt is aware that urine specimen is needed.  

## 2023-10-26 NOTE — ED Provider Notes (Signed)
 [place] EMERGENCY DEPARTMENT AT [facility] Provider Note   CSN: [id number] Arrival date & time: 10/26/23  1524     Patient presents with: Atrial Fibrillation   Johnny Mathews is a 63 y.o. male.    Atrial Fibrillation   This patient is a 63 year old male with a history of cardiac arrhythmias on Eliquis and metoprolol, history of asthma as a child, history of COVID and subsequently developed multiple multiple episodes of atrial fibrillation which appears to be paroxysmal, the patient has followed up with cardiology at the [facility]  [facility] and follows with Dr. Maryanne at [facility].  The patient reports that yesterday when he woke up he noticed that his heart was racing he had some chest discomfort and some shortness of breath it has been persistent since yesterday and when he messaged the office they asked him to come to the hospital.  The patient denies fevers chills nausea vomiting or diarrhea, no abdominal pain or swelling of the legs.  He has not missed a dose of his Eliquis in quite some time several weeks at the minimum, he takes metoprolol twice a day, states he has not missed that.  He does not smoke cigarettes, drinks occasional beer, no recent use of alcohol and no substance abuse.    Prior to Admission medications   Medication Sig Start Date End Date Taking? Authorizing Provider  albuterol  (PROVENTIL ) (2.5 MG/3ML) 0.083% nebulizer solution NEBULIZE 1 VIAL EVERY 6 HOURS AS NEEDED FOR WHEEZING & SHORTNESS OF BREATH 02/10/22   Dettinger, Fonda LABOR, MD  albuterol  (VENTOLIN  HFA) 108 (90 Base) MCG/ACT inhaler INHALE 2 PUFFS EVERY 4 HOURS AS NEEDED 12/11/22   Dettinger, Fonda LABOR, MD  amitriptyline  (ELAVIL ) 100 MG tablet TAKE 2 TABLETS BY MOUTH AT BEDTIME 07/30/23   Dettinger, Fonda LABOR, MD  blood glucose meter kit and supplies Dispense based on patient and insurance preference. Use up to four times daily as directed. (FOR ICD-10  E10.9, E11.9). Pt states needs One Touch Verio Meter and one touch delica plus test strips 11/27/21   Dettinger, Fonda LABOR, MD  Blood Glucose Monitoring Suppl (CONTOUR BLOOD GLUCOSE SYSTEM) w/Device KIT Test blood sugars four times daily 06/30/17   Dettinger, Fonda LABOR, MD  budesonide  (PULMICORT ) 0.25 MG/2ML nebulizer solution One vial twice daily with albuterol  Patient taking differently: Take 0.25 mg by nebulization as needed. One vial twice daily with albuterol  04/09/23   Wert, Michael B, MD  budesonide -formoterol  (SYMBICORT ) 80-4.5 MCG/ACT inhaler Take 2 puffs first thing in am and then another 2 puffs about 12 hours later. 04/16/23   Darlean Ozell NOVAK, MD  budesonide -glycopyrrolate -formoterol  (BREZTRI  AEROSPHERE) 160-9-4.8 MCG/ACT AERO inhaler Inhale into the lungs. 03/30/21   [provider]  cephALEXin  (KEFLEX ) 500 MG capsule Take 1 capsule (500 mg total) by mouth 4 (four) times daily. Patient not taking: Reported on 10/19/2023 10/08/23   Dettinger, Fonda LABOR, MD  cyclobenzaprine  (FLEXERIL ) 10 MG tablet Take 1 tablet (10 mg total) by mouth 3 (three) times daily as needed for muscle spasms. Take 1 tablet by mouth 3 times daily as needed for muscle spasm. Warning: May cause drowsiness. 12/11/22   Dettinger, Fonda LABOR, MD  ELIQUIS 5 MG TABS tablet Take 5 mg by mouth 2 (two) times daily. 01/28/23   [provider]  EPINEPHRINE  0.3 mg/0.3 mL IJ SOAJ injection INJECT 0.3ML (0.3MG ) IM ONCE 03/16/23   Dettinger, Fonda LABOR, MD  esomeprazole  (NEXIUM ) 40 MG capsule Take 1 capsule (40  mg total) by mouth daily at 12 noon. Take 30-60 min before first meal of the day 04/15/23   Darlean Ozell NOVAK, MD  famotidine  (PEPCID ) 20 MG tablet TAKE ONE (1) TABLET BY MOUTH TWO (2) TIMES DAILY 10/14/23   Dettinger, Fonda LABOR, MD  ferrous sulfate  325 (65 FE) MG tablet Take 325 mg by mouth daily with breakfast.    [provider]  finasteride  (PROSCAR ) 5 MG tablet TAKE ONE (1) TABLET EACH DAY 12/11/22   Dettinger, Fonda LABOR, MD  fluticasone  (FLONASE ) 50 MCG/ACT nasal spray USE 1 TO 2 SPRAYS IN EACH NOSTRIL DAILY 09/25/23   Dettinger, Fonda LABOR, MD  furosemide  (LASIX ) 20 MG tablet Take 1 tablet (20 mg total) by mouth daily as needed. Patient taking differently: Take 20 mg by mouth daily. 03/05/23   Dettinger, Fonda LABOR, MD  glucose blood (CONTOUR NEXT TEST) test strip Test blood sugars four times daily 06/30/17   Dettinger, Fonda LABOR, MD  Lancets [facility] CATHRYNE PLUS [place]) MISC  11/27/21   [provider]  losartan  (COZAAR ) 50 MG tablet Take 1 tablet (50 mg total) by mouth daily. 03/05/23   Dettinger, Fonda LABOR, MD  metoprolol succinate (TOPROL-XL) 25 MG 24 hr tablet Take 25 mg by mouth 2 (two) times daily. 12/11/22   [provider]  montelukast  (SINGULAIR ) 10 MG tablet TAKE ONE TABLET DAILY AT BEDTIME 09/25/23   Dettinger, Fonda LABOR, MD  pravastatin  (PRAVACHOL ) 80 MG tablet Take 1 tablet (80 mg total) by mouth daily. 03/05/23   Dettinger, Fonda LABOR, MD  Semaglutide , 1 MG/DOSE, (OZEMPIC , 1 MG/DOSE,) 4 MG/3ML SOPN INJECT 1MG  AS DIRECTED ONCE A WEEK 10/14/23   Dettinger, Fonda LABOR, MD  Spacer/Aero-Holding Raguel FRENCH Use with inhaler daily 06/14/20   Icard, Adine CROME, DO  SYRINGE-NEEDLE, DISP, 3 ML 21G X 1-1/2 3 ML MISC Use to inject testosterone  every week 07/22/21   Nida, Gebreselassie W, MD  tadalafil  (CIALIS ) 10 MG tablet TAKE 1 TABLET BY MOUTH EVERY OTHER DAY AS NEEDED 06/30/23   Zollie Lowers, MD  tamsulosin  (FLOMAX ) 0.4 MG CAPS capsule TAKE 1 CAPSULE DAILY AFTER SUPPER 03/05/23   Dettinger, Fonda LABOR, MD  testosterone  cypionate (DEPOTESTOSTERONE CYPIONATE) 200 MG/ML injection INJECT 100MG  (0.5ML) EVERY 10 DAYS (FOR INTRAMUSCULAR USE ONLY) 03/10/23   Nida, Gebreselassie W, MD    Allergies: Egg protein-containing drug products, Septra [sulfamethoxazole-trimethoprim], and Sulfonamide derivatives    Review of Systems  All other systems reviewed and are negative.   Updated Vital Signs BP 123/89   Pulse  82   Temp 97.8 F (36.6 C) (Oral)   Resp 12   Ht 1.727 m (5' 8)   Wt 82 kg   SpO2 99%   BMI 27.49 kg/m   Physical Exam Vitals and nursing note reviewed.  Constitutional:      General: He is not in acute distress.    Appearance: He is well-developed.  HENT:     Head: Normocephalic and atraumatic.     Mouth/Throat:     Pharynx: No oropharyngeal exudate.  Eyes:     General: No scleral icterus.       Right eye: No discharge.        Left eye: No discharge.     Conjunctiva/sclera: Conjunctivae normal.     Pupils: Pupils are equal, round, and reactive to light.  Neck:     Thyroid : No thyromegaly.     Vascular: No JVD.  Cardiovascular:     Rate and Rhythm: Regular rhythm.  Tachycardia present.     Heart sounds: Normal heart sounds. No murmur heard.    No friction rub. No gallop.  Pulmonary:     Effort: Pulmonary effort is normal. No respiratory distress.     Breath sounds: Normal breath sounds. No wheezing or rales.  Abdominal:     General: Bowel sounds are normal. There is no distension.     Palpations: Abdomen is soft. There is no mass.     Tenderness: There is no abdominal tenderness.  Musculoskeletal:        General: No tenderness. Normal range of motion.     Cervical back: Normal range of motion and neck supple.     Right lower leg: No edema.     Left lower leg: No edema.  Lymphadenopathy:     Cervical: No cervical adenopathy.  Skin:    General: Skin is warm and dry.     Findings: No erythema or rash.  Neurological:     General: No focal deficit present.     Mental Status: He is alert.     Coordination: Coordination normal.  Psychiatric:        Behavior: Behavior normal.     (all labs ordered are listed, but only abnormal results are displayed) Labs Reviewed  CBC WITH DIFFERENTIAL/PLATELET - Abnormal; Notable for the following components:      Result Value   RBC 3.37 (*)    Hemoglobin 10.9 (*)    HCT 33.7 (*)    All other components within normal limits   COMPREHENSIVE METABOLIC PANEL WITH GFR - Abnormal; Notable for the following components:   Creatinine, Ser 1.44 (*)    Alkaline Phosphatase 159 (*)    GFR, Estimated 54 (*)    All other components within normal limits  TSH  URINE DRUG SCREEN    EKG: EKG Interpretation Date/Time:  Monday October 26 2023 15:32:26 EDT Ventricular Rate:  140 PR Interval:    QRS Duration:  114 QT Interval:  298 QTC Calculation: 454 R Axis:   -30  Text Interpretation: Supraventricular tachycardia Left axis deviation Cannot rule out Anterior infarct , age undetermined Abnormal ECG When compared with ECG of 10-Nov-2022 14:54, PREVIOUS ECG IS PRESENT Confirmed by Cleotilde Rogue ([id number]) on 10/26/2023 3:51:55 PM   EKG Interpretation Date/Time:  Monday October 26 2023 16:25:42 EDT Ventricular Rate:  108 PR Interval:    QRS Duration:  125 QT Interval:  341 QTC Calculation: 457 R Axis:   -28  Text Interpretation: Atrial flutter Left bundle branch block Since last tracing rate slower Confirmed by Cleotilde Rogue ([id number]) on 10/26/2023 4:40:21 PM        EKG Interpretation Date/Time:  Monday October 26 2023 17:23:19 EDT Ventricular Rate:  83 PR Interval:  199 QRS Duration:  112 QT Interval:  374 QTC Calculation: 440 R Axis:   -15  Text Interpretation: Sinus rhythm Incomplete left bundle branch block Since last tracing aflutter has resolved Confirmed by Cleotilde Rogue ([id number]) on 10/26/2023 5:34:41 PM         Radiology: [facility] Chest Port 1 View Result Date: 10/26/2023 CLINICAL DATA:  cp, arrhythmia EXAM: PORTABLE CHEST - 1 VIEW COMPARISON:  04/15/2023 FINDINGS: No focal airspace consolidation, pleural effusion, or pneumothorax. No cardiomegaly. Cardiac loop recorder device. Aortic atherosclerosis. No acute fracture or destructive lesions. Multilevel thoracic osteophytosis. IMPRESSION: No acute cardiopulmonary abnormality. Electronically Signed   By: Rogelia Myers M.D.   On: 10/26/2023 16:34      .Cardioversion  Date/Time: 10/26/2023  5:30 PM  Performed by: Cleotilde Rogue, MD Authorized by: Cleotilde Rogue, MD   Consent:    Consent obtained:  Written   Consent given by:  Patient   Risks discussed:  Cutaneous burn, death, induced arrhythmia and pain   Alternatives discussed:  Rate-control medication and alternative treatment Pre-procedure details:    Cardioversion basis:  Elective   Rhythm:  Atrial fibrillation   Electrode placement:  Anterior-posterior Patient sedated: Yes. Refer to sedation procedure documentation for details of sedation.  Attempt one:    Cardioversion mode:  Synchronous   Waveform:  Biphasic   Shock (Joules):  120   Shock outcome:  Conversion to normal sinus rhythm Post-procedure details:    Patient status:  Awake   Patient tolerance of procedure:  Tolerated well, no immediate complications Comments:        .Sedation  Date/Time: 10/26/2023 5:30 PM  Performed by: Cleotilde Rogue, MD Authorized by: Cleotilde Rogue, MD   Consent:    Consent obtained:  Verbal and written   Consent given by:  Patient   Risks discussed:  Allergic reaction, prolonged hypoxia resulting in organ damage, dysrhythmia, prolonged sedation necessitating reversal, inadequate sedation, respiratory compromise necessitating ventilatory assistance and intubation, nausea and vomiting   Alternatives discussed:  Analgesia without sedation Universal protocol:    Procedure explained and questions answered to patient or proxy's satisfaction: yes     Immediately prior to procedure, a time out was called: yes     Patient identity confirmed:  Arm band and verbally with patient Indications:    Procedure performed:  Cardioversion   Procedure necessitating sedation performed by:  Physician performing sedation Pre-sedation assessment:    Time since last food or drink:  2   ASA classification: class 2 - patient with mild systemic disease     Mouth opening:  3 or more finger widths    Thyromental distance:  4 finger widths   Mallampati score:  II - soft palate, uvula, fauces visible   Neck mobility: normal     Pre-sedation assessments completed and reviewed: airway patency, cardiovascular function, hydration status, mental status, nausea/vomiting, pain level, respiratory function and temperature   A pre-sedation assessment was completed prior to the start of the procedure Immediate pre-procedure details:    Reassessment: Patient reassessed immediately prior to procedure     Reviewed: vital signs, relevant labs/tests and NPO status     Verified: bag valve mask available, emergency equipment available, intubation equipment available, IV patency confirmed, oxygen available and reversal medications available   Procedure details (see MAR for exact dosages):    Preoxygenation:  Nasal cannula   Sedation:  Propofol  and midazolam    Intended level of sedation: deep   Analgesia:  Fentanyl    Intra-procedure monitoring:  Blood pressure monitoring, cardiac monitor, continuous capnometry, continuous pulse oximetry, frequent LOC assessments and frequent vital sign checks   Intra-procedure events: none     Total Provider sedation time (minutes):  15 Post-procedure details:   A post-sedation assessment was completed following the completion of the procedure.   Attendance: Constant attendance by certified staff until patient recovered     Recovery: Patient returned to pre-procedure baseline     Post-sedation assessments completed and reviewed: airway patency, cardiovascular function, hydration status, mental status, nausea/vomiting, pain level, respiratory function and temperature     Patient is stable for discharge or admission: yes     Procedure completion:  Tolerated well, no immediate complications Comments:       .Critical Care  Performed by: Cleotilde Rogue, MD Authorized by: Cleotilde Rogue, MD   Critical care provider statement:    Critical care time (minutes):  30   Critical  care time was exclusive of:  Separately billable procedures and treating other patients and teaching time   Critical care was necessary to treat or prevent imminent or life-threatening deterioration of the following conditions:  Circulatory failure   Critical care was time spent personally by me on the following activities:  Development of treatment plan with patient or surrogate, discussions with consultants, evaluation of patient's response to treatment, examination of patient, obtaining history from patient or surrogate, review of old charts, re-evaluation of patient's condition, pulse oximetry, ordering and review of radiographic studies, ordering and review of laboratory studies and ordering and performing treatments and interventions   I assumed direction of critical care for this patient from another provider in my specialty: no     Care discussed with: admitting provider   Comments:          Medications Ordered in the ED  diltiazem  (CARDIZEM ) injection 20 mg (20 mg Intravenous Given 10/26/23 1554)  fentaNYL  (SUBLIMAZE ) injection 50 mcg (50 mcg Intravenous Given 10/26/23 1705)  propofol  (DIPRIVAN ) 10 mg/mL bolus/IV push 41 mg (41 mg Intravenous Given 10/26/23 1712)  midazolam  PF (VERSED ) injection 2 mg (2 mg Intravenous Given 10/26/23 1710)  propofol  (DIPRIVAN ) 10 mg/mL bolus/IV push (20 mg Intravenous Given 10/26/23 1716)                                    Medical Decision Making Amount and/or Complexity of Data Reviewed Labs: ordered. Radiology: ordered. ECG/medicine tests: ordered.  Risk Prescription drug management.    This patient presents to the ED for concern of chest pain shortness of breath with palpitations, this involves an extensive number of treatment options, and is a complaint that carries with it a high risk of complications and morbidity.  The differential diagnosis includes unlikely to be ischemic, much more likely to be related to his atrial fibrillation or  flutter, he has a heart rate of about 140 bpm on the monitor which is narrow complex, there does not appear to be any other ectopy and it is very regular, this could be a sinus tachycardia but I suspect atrial flutter with 2-1 block   Co morbidities / Chronic conditions that complicate the patient evaluation  Etoh use Afib Lung disease   Additional history obtained:  Additional history obtained from EMR External records from outside source obtained and reviewed including office visits Cardiology notes   Lab Tests:  I Ordered, and personally interpreted labs.  The pertinent results include: CBC without acute findings other than mild anemia, metabolic panel with a creatinine of 1.44 consistent with prior values over the last few months.  TSH is normal   Imaging Studies ordered:  I ordered imaging studies including chest x-ray I independently visualized and interpreted imaging which showed no acute findings I agree with the radiologist interpretation   Cardiac Monitoring: / EKG:  The patient was maintained on a cardiac monitor.  I personally viewed and interpreted the cardiac monitored which showed an underlying rhythm of: Atrial flutter with rapid ventricular rate   Problem List / ED Course / Critical interventions / Medication management  The patient required cardioversion after a dose of Cardizem  did not change his heart rate.  He successfully converted back to normal sinus rhythm with  the defibrillation cardioversion event at 120 j, biphasic I ordered medication including fentanyl , propofol , Versed  Reevaluation of the patient after these medicines showed that the patient successfully resolved the arrhythmia I have reviewed the patients home medicines and have made adjustments as needed   Social Determinants of Health:  Frequent A-flutter, paroxysmal   Test / Admission - Considered:  Sitter at admission but the patient spontaneously resolved with intervention, stable  for discharge  The patient was reexamined multiple times after the cardioversion, returned back to baseline almost immediately, he is now awake alert and able to follow commands answer questions and ambulate.  Vital signs are normal, repeat EKG is normal sinus rhythm.  Stable for discharge      Final diagnoses:  Atrial flutter with rapid ventricular response [facility])    ED Discharge Orders     None          Cleotilde Rogue, MD 10/26/23 1810

## 2023-10-27 ENCOUNTER — Ambulatory Visit: Admitting: Physical Therapy

## 2023-10-30 ENCOUNTER — Encounter: Payer: Self-pay | Admitting: *Deleted

## 2023-10-30 ENCOUNTER — Ambulatory Visit: Admitting: *Deleted

## 2023-10-30 DIAGNOSIS — M6281 Muscle weakness (generalized): Secondary | ICD-10-CM

## 2023-10-30 DIAGNOSIS — G8929 Other chronic pain: Secondary | ICD-10-CM

## 2023-10-30 DIAGNOSIS — M25512 Pain in left shoulder: Secondary | ICD-10-CM | POA: Diagnosis not present

## 2023-10-30 NOTE — Therapy (Signed)
 OUTPATIENT PHYSICAL THERAPY SHOULDER TREATMENT   Patient Name: Johnny Mathews MRN: [id number] DOB:1960-11-26, 63 y.o., male Today's Date: 10/30/2023  END OF SESSION:  PT End of Session - 10/30/23 0935     Visit Number 3    Number of Visits 12    Date for Recertification  12/01/23    PT Start Time 0935    PT Stop Time 1022    PT Time Calculation (min) 47 min          Past Medical History:  Diagnosis Date   Allergy    Alpha galactosidase deficiency    Anemia    past hx    Arthritis    Asthma    Atrial fibrillation (HCC)    Blood transfusion without reported diagnosis    BPH (benign prostatic hypertrophy)    Cataract    removed left eye    Complication of anesthesia    Diabetes mellitus without complication (HCC)    Dysrhythmia    GERD (gastroesophageal reflux disease)    Heart murmur    History of kidney stones    HOH (hard of hearing)    Hyperlipidemia    Hypertension    Irregular heart beat    Muscular dystrophy (HCC)    PONV (postoperative nausea and vomiting)    Sleep apnea    Wears hearing aid in both ears    Past Surgical History:  Procedure Laterality Date   CARDIAC ELECTROPHYSIOLOGY STUDY AND ABLATION  11/2022   CATARACT EXTRACTION Left    COLONOSCOPY     ESOPHAGOGASTRODUODENOSCOPY N/A 08/27/2020   Procedure: ESOPHAGOGASTRODUODENOSCOPY (EGD);  Surgeon: Shyrl Linnie KIDD, MD;  Location: [facility];  Service: Thoracic;  Laterality: N/A;   FEMUR FRACTURE SURGERY     INGUINAL HERNIA REPAIR Right 08/07/2021   Procedure: HERNIA REPAIR INGUINAL ADULT WITH MESH;  Surgeon: Kallie Manuelita BROCKS, MD;  Location: [facility];  Service: General;  Laterality: Right;   INSERTION OF MESH N/A 08/27/2020   Procedure: INSERTION OF ACELL 7.5 x 6cm GENTRIX SURGICAL MATRIX HIATAL MESH;  Surgeon: Shyrl Linnie KIDD, MD;  Location: [facility];  Service: Thoracic;  Laterality: N/A;   KIDNEY STONE SURGERY     x6   KNEE SURGERY Right    x2   MASS EXCISION Right 08/02/2018    Procedure: EXCISION RIGHT LONG FINGER MASS, DEBRIDEMENT PROXIMAL INTERPHALANGEAL JOINT WITH ROTATION FLAP;  Surgeon: Murrell Drivers, MD;  Location: [facility];  Service: Orthopedics;  Laterality: Right;   NISSEN FUNDOPLICATION     [facility]    SHOULDER OPEN ROTATOR CUFF REPAIR     XI ROBOTIC ASSISTED HIATAL HERNIA REPAIR N/A 08/27/2020   Procedure: XI ROBOTIC ASSISTED REDO HIATAL HERNIA REPAIR;  Surgeon: Shyrl Linnie KIDD, MD;  Location: [facility] OR;  Service: Thoracic;  Laterality: N/A;   Patient Active Problem List   Diagnosis Date Noted   AC joint arthropathy 08/05/2023   Impingement of left shoulder 08/05/2023   Laceration of left thumb without foreign body without damage to nail 04/21/2023   Pulmonary infiltrates on CXR 04/09/2023   Myopathy 03/20/2023   Asthma 03/20/2023   Recurrent right inguinal hernia    PAF (paroxysmal atrial fibrillation) (HCC) 01/17/2021   Status post placement of implantable loop recorder 12/10/2020   Hiatal hernia 08/27/2020   Anemia 04/05/2020   BPH (benign prostatic hyperplasia) 11/09/2019   Aortic valve stenosis 07/19/2019   Coronary artery disease of native artery of native heart with stable angina pectoris 04/18/2019   Anginal  equivalent 01/18/2019   Chronic heart failure with preserved ejection fraction (HCC) 01/18/2019   DOE (dyspnea on exertion) 01/18/2019   Osteoarthritis of finger of right hand 07/21/2018   Hypogonadism, male 03/23/2018   Allergy to meat 06/21/2015   Hypertension associated with diabetes (HCC) 02/05/2015   Sciatica of left side 10/02/2014   Mitochondrial myopathy 07/25/2014   GERD (gastroesophageal reflux disease) 12/23/2012   Hyperlipidemia associated with type 2 diabetes mellitus (HCC) 12/23/2012   Diabetes mellitus, stable (HCC) 12/23/2012   Asthma, mild intermittent 12/23/2012   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  THERAPY  DIAG:  Chronic left shoulder pain  Muscle weakness (generalized)  Rationale for Evaluation and Treatment: Rehabilitation  ONSET DATE: Ongoing.    SUBJECTIVE:                                                                                                                                                                                      SUBJECTIVE STATEMENT: The patient presents to the clinic with c/o chronic left shoulder pain.  3/10 today and doing    PERTINENT HISTORY: MD.  Please see above.    PAIN:  Are you having pain? Yes: NPRS scale: 3/10. Pain location: Left shoulder. Pain description: Cramping and shooting.   Aggravating factors: As above. Relieving factors: Rest.    PRECAUTIONS: None  RED FLAGS: None   WEIGHT BEARING RESTRICTIONS: No  FALLS:  Has patient fallen in last 6 months? No  LIVING ENVIRONMENT: Lives in: House/apartment Has following equipment at home: None  OCCUPATION: Retired.    PLOF: Independent  PATIENT GOALS:Use left shoulder with less pain.    NEXT MD VISIT:   OBJECTIVE:   PATIENT SURVEYS:  Quick DASH:  36 points/56.81.  POSTURE: Pronounced rounded shoulder posture and it appears increased left anterior humeral head migration.  UPPER EXTREMITY ROM:  Full active range of motion.  UPPER EXTREMITY MMT:  Left middle deltoid 4+/5, IR is 4+/5 and ER is 4/5.    SHOULDER SPECIAL TESTS: (-) Drop arm test, no pain reproduction with Impingement testing.  PALPATION:  TTP left acromial ridge and ACJ region.  TREATMENT DATE: 10/30/23;                                     EXERCISE LOG   LT shldr  Exercise Repetitions and Resistance Comments  UBE  X 6 mins 90 RPMs   Pulleys X 5  mins   RW4 (no punch added EXT) 2x 10 each  , isometric walk out with ER   Supine CW/CCW at 90 degrees    UE Ranger standing X 5  mins   ER Bil  Red Tband 2x10 small movements Added to HEP   Blank cell = exercise not performed today  Manual Rhythmic stab for elevation at many angles as well as IR and ER      PATIENT EDUCATION: Education details: Discussed prognosis and potential exercise progression. Person educated: Patient Education method: Explanation Education comprehension: verbalized understanding  HOME EXERCISE PROGRAM:   ASSESSMENT:  CLINICAL IMPRESSION: Pt arrived today doing better with decreased LT shldr pain. Rx focused on AAROM , low level strengthening and AROM  exs avoiding pushing and pain as well as manual PROM and rhytmic stab act's with focus on  ER mm activation     OBJECTIVE IMPAIRMENTS: decreased activity tolerance, decreased strength, increased muscle spasms, and pain.   ACTIVITY LIMITATIONS: Pushing.  PARTICIPATION LIMITATIONS: Gym program.   PERSONAL FACTORS: Time since onset of injury/illness/exacerbation are also affecting patient's functional outcome.   REHAB POTENTIAL: Good  CLINICAL DECISION MAKING: Stable/uncomplicated  EVALUATION COMPLEXITY: Low   GOALS:  SHORT TERM GOALS: Target date: 11/03/23  Ind with an initial HEP. Goal status: MET      LONG TERM GOALS: Target date: 12/01/23  Ind with an advanced HEP.  Goal status: INITIAL  2.  Increase shoulder strength to a solid 5/5 to increase stability for performance of functional activities.  Goal status: INITIAL  3.  Perform regular gym workout with pain not > 3/10.  Goal status: INITIAL  4.  Improve Quick DASH score by at least 5 points.  Goal status: INITIAL  PLAN:  PT FREQUENCY: 2x/week  PT DURATION: 6 weeks  PLANNED INTERVENTIONS: 97110-Therapeutic exercises, 97530- Therapeutic activity, [id number]- Neuromuscular re-education, 97535- Self Care, 02859- Manual therapy, G0283- Electrical stimulation (unattended), 97016- Vasopneumatic device, 97035- Ultrasound, Patient/Family education, Cryotherapy, and  Moist heat  PLAN FOR NEXT SESSION: UBE, scapular exercises, RW4, PRE.  Modalities as needed.   Michol Emory,CHRIS, PTA 10/30/2023, 12:16 PM

## 2023-11-02 ENCOUNTER — Ambulatory Visit: Admitting: Physical Therapy

## 2023-11-03 ENCOUNTER — Ambulatory Visit: Admitting: *Deleted

## 2023-11-03 ENCOUNTER — Encounter: Payer: Self-pay | Admitting: *Deleted

## 2023-11-03 DIAGNOSIS — M6281 Muscle weakness (generalized): Secondary | ICD-10-CM

## 2023-11-03 DIAGNOSIS — G8929 Other chronic pain: Secondary | ICD-10-CM

## 2023-11-03 DIAGNOSIS — M25512 Pain in left shoulder: Secondary | ICD-10-CM | POA: Diagnosis not present

## 2023-11-03 NOTE — Therapy (Signed)
 OUTPATIENT PHYSICAL THERAPY SHOULDER TREATMENT   Patient Name: Johnny Mathews MRN: [id number] DOB:Nov 18, 1960, 63 y.o., male Today's Date: 11/03/2023  END OF SESSION:  PT End of Session - 11/03/23 0939     Visit Number 4    Number of Visits 12    Date for Recertification  12/01/23    PT Start Time 0930    PT Stop Time 1018    PT Time Calculation (min) 48 min          Past Medical History:  Diagnosis Date   Allergy    Alpha galactosidase deficiency    Anemia    past hx    Arthritis    Asthma    Atrial fibrillation (HCC)    Blood transfusion without reported diagnosis    BPH (benign prostatic hypertrophy)    Cataract    removed left eye    Complication of anesthesia    Diabetes mellitus without complication (HCC)    Dysrhythmia    GERD (gastroesophageal reflux disease)    Heart murmur    History of kidney stones    HOH (hard of hearing)    Hyperlipidemia    Hypertension    Irregular heart beat    Muscular dystrophy (HCC)    PONV (postoperative nausea and vomiting)    Sleep apnea    Wears hearing aid in both ears    Past Surgical History:  Procedure Laterality Date   CARDIAC ELECTROPHYSIOLOGY STUDY AND ABLATION  11/2022   CATARACT EXTRACTION Left    COLONOSCOPY     ESOPHAGOGASTRODUODENOSCOPY N/A 08/27/2020   Procedure: ESOPHAGOGASTRODUODENOSCOPY (EGD);  Surgeon: Shyrl Linnie KIDD, MD;  Location: [facility] OR;  Service: Thoracic;  Laterality: N/A;   FEMUR FRACTURE SURGERY     INGUINAL HERNIA REPAIR Right 08/07/2021   Procedure: HERNIA REPAIR INGUINAL ADULT WITH MESH;  Surgeon: Kallie Manuelita BROCKS, MD;  Location: [facility];  Service: General;  Laterality: Right;   INSERTION OF MESH N/A 08/27/2020   Procedure: INSERTION OF ACELL 7.5 x 6cm GENTRIX SURGICAL MATRIX HIATAL MESH;  Surgeon: Shyrl Linnie KIDD, MD;  Location: [facility];  Service: Thoracic;  Laterality: N/A;   KIDNEY STONE SURGERY     x6   KNEE SURGERY Right    x2   MASS EXCISION Right 08/02/2018    Procedure: EXCISION RIGHT LONG FINGER MASS, DEBRIDEMENT PROXIMAL INTERPHALANGEAL JOINT WITH ROTATION FLAP;  Surgeon: Murrell Drivers, MD;  Location: [facility];  Service: Orthopedics;  Laterality: Right;   NISSEN FUNDOPLICATION     [facility]    SHOULDER OPEN ROTATOR CUFF REPAIR     XI ROBOTIC ASSISTED HIATAL HERNIA REPAIR N/A 08/27/2020   Procedure: XI ROBOTIC ASSISTED REDO HIATAL HERNIA REPAIR;  Surgeon: Shyrl Linnie KIDD, MD;  Location: [facility];  Service: Thoracic;  Laterality: N/A;   Patient Active Problem List   Diagnosis Date Noted   AC joint arthropathy 08/05/2023   Impingement of left shoulder 08/05/2023   Laceration of left thumb without foreign body without damage to nail 04/21/2023   Pulmonary infiltrates on CXR 04/09/2023   Myopathy 03/20/2023   Asthma 03/20/2023   Recurrent right inguinal hernia    PAF (paroxysmal atrial fibrillation) (HCC) 01/17/2021   Status post placement of implantable loop recorder 12/10/2020   Hiatal hernia 08/27/2020   Anemia 04/05/2020   BPH (benign prostatic hyperplasia) 11/09/2019   Aortic valve stenosis 07/19/2019   Coronary artery disease of native artery of native heart with stable angina pectoris 04/18/2019   Anginal  equivalent 01/18/2019   Chronic heart failure with preserved ejection fraction (HCC) 01/18/2019   DOE (dyspnea on exertion) 01/18/2019   Osteoarthritis of finger of right hand 07/21/2018   Hypogonadism, male 03/23/2018   Allergy to meat 06/21/2015   Hypertension associated with diabetes (HCC) 02/05/2015   Sciatica of left side 10/02/2014   Mitochondrial myopathy 07/25/2014   GERD (gastroesophageal reflux disease) 12/23/2012   Hyperlipidemia associated with type 2 diabetes mellitus (HCC) 12/23/2012   Diabetes mellitus, stable (HCC) 12/23/2012   Asthma, mild intermittent 12/23/2012   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  THERAPY  DIAG:  Chronic left shoulder pain  Muscle weakness (generalized)  Rationale for Evaluation and Treatment: Rehabilitation  ONSET DATE: Ongoing.    SUBJECTIVE:                                                                                                                                                                                      SUBJECTIVE STATEMENT: The patient reports doing better since starting PT.   3/10 today and doing    PERTINENT HISTORY: MD.  Please see above.    PAIN:  Are you having pain? Yes: NPRS scale: 3/10. Pain location: Left shoulder. Pain description: Cramping and shooting.   Aggravating factors: As above. Relieving factors: Rest.    PRECAUTIONS: None  RED FLAGS: None   WEIGHT BEARING RESTRICTIONS: No  FALLS:  Has patient fallen in last 6 months? No  LIVING ENVIRONMENT: Lives in: House/apartment Has following equipment at home: None  OCCUPATION: Retired.    PLOF: Independent  PATIENT GOALS:Use left shoulder with less pain.    NEXT MD VISIT:   OBJECTIVE:   PATIENT SURVEYS:  Quick DASH:  36 points/56.81.  POSTURE: Pronounced rounded shoulder posture and it appears increased left anterior humeral head migration.  UPPER EXTREMITY ROM:  Full active range of motion.  UPPER EXTREMITY MMT:  Left middle deltoid 4+/5, IR is 4+/5 and ER is 4/5.    SHOULDER SPECIAL TESTS: (-) Drop arm test, no pain reproduction with Impingement testing.  PALPATION:  TTP left acromial ridge and ACJ region.  TREATMENT DATE: 11/03/23;                                     EXERCISE LOG   LT shldr  Exercise Repetitions and Resistance Comments  UBE  X 10 mins 90 RPMs   Pulleys X 5  mins   RW4 (no punch added EXT) 3x 10 each with Green    Supine CW/CCW at 90 degrees    UE Ranger standing X 5 mins   ER Bil  Yellow  Tband 2x10  small movements Added to HEP   Blank cell = exercise not performed today  Manual Rhythmic stab for elevation at many angles as well as IR and ER  STW posterior cuff    PATIENT EDUCATION: Education details: Discussed prognosis and potential exercise progression. Person educated: Patient Education method: Explanation Education comprehension: verbalized understanding  HOME EXERCISE PROGRAM:   ASSESSMENT:  CLINICAL IMPRESSION: Pt arrived today doing better with decreased LT shldr pain since starting PT.  Rx focused again  on AAROM , low level strengthening and AROM  exs avoiding pushing and pain as well as manual PROM and rhytmic stab act's with focus on  ER mm activation. STW to posterior cuff tolerated well.     OBJECTIVE IMPAIRMENTS: decreased activity tolerance, decreased strength, increased muscle spasms, and pain.   ACTIVITY LIMITATIONS: Pushing.  PARTICIPATION LIMITATIONS: Gym program.   PERSONAL FACTORS: Time since onset of injury/illness/exacerbation are also affecting patient's functional outcome.   REHAB POTENTIAL: Good  CLINICAL DECISION MAKING: Stable/uncomplicated  EVALUATION COMPLEXITY: Low   GOALS:  SHORT TERM GOALS: Target date: 11/03/23  Ind with an initial HEP. Goal status: MET      LONG TERM GOALS: Target date: 12/01/23  Ind with an advanced HEP.  Goal status: On going  2.  Increase shoulder strength to a solid 5/5 to increase stability for performance of functional activities.  Goal status: On going  3.  Perform regular gym workout with pain not > 3/10.  Goal status: On going  4.  Improve Quick DASH score by at least 5 points.  Goal status: On going  PLAN:  PT FREQUENCY: 2x/week  PT DURATION: 6 weeks  PLANNED INTERVENTIONS: 97110-Therapeutic exercises, 97530- Therapeutic activity, [id number]- Neuromuscular re-education, 97535- Self Care, 02859- Manual therapy, G0283- Electrical stimulation (unattended), 97016- Vasopneumatic device, 97035-  Ultrasound, Patient/Family education, Cryotherapy, and Moist heat  PLAN FOR NEXT SESSION: UBE, scapular exercises, RW4, PRE.  Modalities as needed.   Yamile Roedl,CHRIS, PTA 11/03/2023, 10:33 AM

## 2023-11-05 ENCOUNTER — Ambulatory Visit: Admitting: Physical Therapy

## 2023-11-05 ENCOUNTER — Encounter: Payer: Self-pay | Admitting: Physical Therapy

## 2023-11-05 DIAGNOSIS — M5459 Other low back pain: Secondary | ICD-10-CM

## 2023-11-05 DIAGNOSIS — M25512 Pain in left shoulder: Secondary | ICD-10-CM | POA: Diagnosis not present

## 2023-11-05 DIAGNOSIS — G8929 Other chronic pain: Secondary | ICD-10-CM

## 2023-11-05 DIAGNOSIS — M62838 Other muscle spasm: Secondary | ICD-10-CM

## 2023-11-05 DIAGNOSIS — M6281 Muscle weakness (generalized): Secondary | ICD-10-CM

## 2023-11-05 NOTE — Therapy (Addendum)
 OUTPATIENT PHYSICAL THERAPY SHOULDER TREATMENT   Patient Name: Johnny Mathews MRN: [id number] DOB:1960/05/16, 63 y.o., male Today's Date: 11/05/2023  END OF SESSION:  PT End of Session - 11/05/23 0929     Visit Number 5    Number of Visits 12    Date for Recertification  12/01/23    PT Start Time 0930    PT Stop Time 1030    PT Time Calculation (min) 60 min           Past Medical History:  Diagnosis Date   Allergy    Alpha galactosidase deficiency    Anemia    past hx    Arthritis    Asthma    Atrial fibrillation (HCC)    Blood transfusion without reported diagnosis    BPH (benign prostatic hypertrophy)    Cataract    removed left eye    Complication of anesthesia    Diabetes mellitus without complication (HCC)    Dysrhythmia    GERD (gastroesophageal reflux disease)    Heart murmur    History of kidney stones    HOH (hard of hearing)    Hyperlipidemia    Hypertension    Irregular heart beat    Muscular dystrophy (HCC)    PONV (postoperative nausea and vomiting)    Sleep apnea    Wears hearing aid in both ears    Past Surgical History:  Procedure Laterality Date   CARDIAC ELECTROPHYSIOLOGY STUDY AND ABLATION  11/2022   CATARACT EXTRACTION Left    COLONOSCOPY     ESOPHAGOGASTRODUODENOSCOPY N/A 08/27/2020   Procedure: ESOPHAGOGASTRODUODENOSCOPY (EGD);  Surgeon: Shyrl Linnie KIDD, MD;  Location: [facility] OR;  Service: Thoracic;  Laterality: N/A;   FEMUR FRACTURE SURGERY     INGUINAL HERNIA REPAIR Right 08/07/2021   Procedure: HERNIA REPAIR INGUINAL ADULT WITH MESH;  Surgeon: Kallie Manuelita BROCKS, MD;  Location: [facility];  Service: General;  Laterality: Right;   INSERTION OF MESH N/A 08/27/2020   Procedure: INSERTION OF ACELL 7.5 x 6cm GENTRIX SURGICAL MATRIX HIATAL MESH;  Surgeon: Shyrl Linnie KIDD, MD;  Location: [facility];  Service: Thoracic;  Laterality: N/A;   KIDNEY STONE SURGERY     x6   KNEE SURGERY Right    x2   MASS EXCISION Right 08/02/2018    Procedure: EXCISION RIGHT LONG FINGER MASS, DEBRIDEMENT PROXIMAL INTERPHALANGEAL JOINT WITH ROTATION FLAP;  Surgeon: Murrell Drivers, MD;  Location: [facility];  Service: Orthopedics;  Laterality: Right;   NISSEN FUNDOPLICATION     [facility]    SHOULDER OPEN ROTATOR CUFF REPAIR     XI ROBOTIC ASSISTED HIATAL HERNIA REPAIR N/A 08/27/2020   Procedure: XI ROBOTIC ASSISTED REDO HIATAL HERNIA REPAIR;  Surgeon: Shyrl Linnie KIDD, MD;  Location: [facility];  Service: Thoracic;  Laterality: N/A;   Patient Active Problem List   Diagnosis Date Noted   AC joint arthropathy 08/05/2023   Impingement of left shoulder 08/05/2023   Laceration of left thumb without foreign body without damage to nail 04/21/2023   Pulmonary infiltrates on CXR 04/09/2023   Myopathy 03/20/2023   Asthma 03/20/2023   Recurrent right inguinal hernia    PAF (paroxysmal atrial fibrillation) (HCC) 01/17/2021   Status post placement of implantable loop recorder 12/10/2020   Hiatal hernia 08/27/2020   Anemia 04/05/2020   BPH (benign prostatic hyperplasia) 11/09/2019   Aortic valve stenosis 07/19/2019   Coronary artery disease of native artery of native heart with stable angina pectoris 04/18/2019  Anginal equivalent 01/18/2019   Chronic heart failure with preserved ejection fraction (HCC) 01/18/2019   DOE (dyspnea on exertion) 01/18/2019   Osteoarthritis of finger of right hand 07/21/2018   Hypogonadism, male 03/23/2018   Allergy to meat 06/21/2015   Hypertension associated with diabetes (HCC) 02/05/2015   Sciatica of left side 10/02/2014   Mitochondrial myopathy 07/25/2014   GERD (gastroesophageal reflux disease) 12/23/2012   Hyperlipidemia associated with type 2 diabetes mellitus (HCC) 12/23/2012   Diabetes mellitus, stable (HCC) 12/23/2012   Asthma, mild intermittent 12/23/2012   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  THERAPY  DIAG:  Chronic left shoulder pain  Muscle weakness (generalized)  Other low back pain  Other muscle spasm  Rationale for Evaluation and Treatment: Rehabilitation  ONSET DATE: Ongoing.    SUBJECTIVE:                                                                                                                                                                                      SUBJECTIVE STATEMENT: The patient reports he feels his shoulder is doing better.   2/10 pain today   PERTINENT HISTORY: MD.  Please see above.    PAIN:  Are you having pain? Yes: NPRS scale: 2/10. Pain location: Left shoulder. Pain description: Cramping and shooting.   Aggravating factors: As above. Relieving factors: Rest.    PRECAUTIONS: None  RED FLAGS: None   WEIGHT BEARING RESTRICTIONS: No  FALLS:  Has patient fallen in last 6 months? No  LIVING ENVIRONMENT: Lives in: House/apartment Has following equipment at home: None  OCCUPATION: Retired.    PLOF: Independent  PATIENT GOALS:Use left shoulder with less pain.    NEXT MD VISIT:   OBJECTIVE:   PATIENT SURVEYS:  Quick DASH:  36 points/56.81.  POSTURE: Pronounced rounded shoulder posture and it appears increased left anterior humeral head migration.  UPPER EXTREMITY ROM:  Full active range of motion.  UPPER EXTREMITY MMT: Left middle deltoid 4+/5, IR is 4+/5 and ER is 4/5.    SHOULDER SPECIAL TESTS: (-) Drop arm test, no pain reproduction with Impingement testing.  PALPATION:  TTP left acromial ridge and ACJ region.  TREATMENT DATE:   11/05/23                                    EXERCISE LOG   LT shldr  Exercise Repetitions and Resistance Comments  UBE  X 10 mins 90 RPMs   UE ranger flexion, scaption, circles CW & CCW X2 min each   Shoulder ER yellow TB Eccentrics 2x10   Shoulder ext  yellow TB 2x10   Shoulder low horizontal abd yellow TB  2x10   Seated W AAROM 2x10 Added to HEP   Blank cell = exercise not performed today  STW posterior cuff Vaso: L shoulder x 15 min, 34 deg, low compression    PATIENT EDUCATION: Education details: Discussed prognosis and potential exercise progression. Person educated: Patient Education method: Explanation Education comprehension: verbalized understanding  HOME EXERCISE PROGRAM:   ASSESSMENT:  CLINICAL IMPRESSION: Treatment focused on continuing to improve posterior cuff strengthening. Remains most tight along infraspinatus. Scapular strengthening and mobility continued   OBJECTIVE IMPAIRMENTS: decreased activity tolerance, decreased strength, increased muscle spasms, and pain.   ACTIVITY LIMITATIONS: Pushing.  PARTICIPATION LIMITATIONS: Gym program.   PERSONAL FACTORS: Time since onset of injury/illness/exacerbation are also affecting patient's functional outcome.   REHAB POTENTIAL: Good  CLINICAL DECISION MAKING: Stable/uncomplicated  EVALUATION COMPLEXITY: Low   GOALS:  SHORT TERM GOALS: Target date: 11/03/23  Ind with an initial HEP. Goal status: MET      LONG TERM GOALS: Target date: 12/01/23  Ind with an advanced HEP.  Goal status: On going  2.  Increase shoulder strength to a solid 5/5 to increase stability for performance of functional activities.  Goal status: On going  3.  Perform regular gym workout with pain not > 3/10.  Goal status: On going  4.  Improve Quick DASH score by at least 5 points.  Goal status: On going  PLAN:  PT FREQUENCY: 2x/week  PT DURATION: 6 weeks  PLANNED INTERVENTIONS: 97110-Therapeutic exercises, 97530- Therapeutic activity, [id number]- Neuromuscular re-education, 97535- Self Care, 02859- Manual therapy, G0283- Electrical stimulation (unattended), 97016- Vasopneumatic device, 97035- Ultrasound, Patient/Family education, Cryotherapy, and Moist heat  PLAN FOR NEXT  SESSION: UBE, scapular exercises, RW4, PRE.  Modalities as needed.   Tywone Bembenek April Ma L Jahiem Franzoni, PT 11/05/2023, 4:18 PM

## 2023-11-09 DIAGNOSIS — L03032 Cellulitis of left toe: Secondary | ICD-10-CM | POA: Diagnosis not present

## 2023-11-12 ENCOUNTER — Ambulatory Visit

## 2023-11-18 DIAGNOSIS — E291 Testicular hypofunction: Secondary | ICD-10-CM | POA: Diagnosis not present

## 2023-11-19 LAB — TESTOSTERONE, FREE, TOTAL, SHBG
Sex Hormone Binding: 84.5 nmol/L — ABNORMAL HIGH (ref 19.3–76.4)
Testosterone, Free: 6.6 pg/mL (ref 6.6–18.1)
Testosterone: 617 ng/dL (ref 264–916)

## 2023-11-19 LAB — PSA: Prostate Specific Ag, Serum: 0.7 ng/mL (ref 0.0–4.0)

## 2023-11-19 LAB — PROLACTIN: Prolactin: 9.8 ng/mL (ref 3.6–25.2)

## 2023-11-22 ENCOUNTER — Emergency Department (HOSPITAL_COMMUNITY)

## 2023-11-22 ENCOUNTER — Emergency Department (HOSPITAL_COMMUNITY)
Admission: EM | Admit: 2023-11-22 | Discharge: 2023-11-22 | Disposition: A | Discharge status: Home / Self Care | Attending: Emergency Medicine | Admitting: Emergency Medicine

## 2023-11-22 ENCOUNTER — Other Ambulatory Visit: Payer: Self-pay

## 2023-11-22 ENCOUNTER — Encounter (HOSPITAL_COMMUNITY): Payer: Self-pay

## 2023-11-22 DIAGNOSIS — R002 Palpitations: Secondary | ICD-10-CM | POA: Diagnosis not present

## 2023-11-22 DIAGNOSIS — Z79899 Other long term (current) drug therapy: Secondary | ICD-10-CM | POA: Diagnosis not present

## 2023-11-22 DIAGNOSIS — I10 Essential (primary) hypertension: Secondary | ICD-10-CM | POA: Insufficient documentation

## 2023-11-22 DIAGNOSIS — Z0189 Encounter for other specified special examinations: Secondary | ICD-10-CM

## 2023-11-22 DIAGNOSIS — Z7901 Long term (current) use of anticoagulants: Secondary | ICD-10-CM | POA: Insufficient documentation

## 2023-11-22 DIAGNOSIS — R000 Tachycardia, unspecified: Secondary | ICD-10-CM | POA: Diagnosis present

## 2023-11-22 DIAGNOSIS — Z4502 Encounter for adjustment and management of automatic implantable cardiac defibrillator: Secondary | ICD-10-CM | POA: Diagnosis not present

## 2023-11-22 DIAGNOSIS — I4891 Unspecified atrial fibrillation: Secondary | ICD-10-CM | POA: Diagnosis not present

## 2023-11-22 LAB — CBC
HCT: 34.8 % — ABNORMAL LOW (ref 39.0–52.0)
Hemoglobin: 11.3 g/dL — ABNORMAL LOW (ref 13.0–17.0)
MCH: 32.3 pg (ref 26.0–34.0)
MCHC: 32.5 g/dL (ref 30.0–36.0)
MCV: 99.4 fL (ref 80.0–100.0)
Platelets: 201 K/uL (ref 150–400)
RBC: 3.5 MIL/uL — ABNORMAL LOW (ref 4.22–5.81)
RDW: 13.2 % (ref 11.5–15.5)
WBC: 5.5 K/uL (ref 4.0–10.5)
nRBC: 0 % (ref 0.0–0.2)

## 2023-11-22 LAB — BASIC METABOLIC PANEL WITH GFR
Anion gap: 11 (ref 5–15)
BUN: 17 mg/dL (ref 8–23)
CO2: 24 mmol/L (ref 22–32)
Calcium: 9 mg/dL (ref 8.9–10.3)
Chloride: 106 mmol/L (ref 98–111)
Creatinine, Ser: 1.05 mg/dL (ref 0.61–1.24)
GFR, Estimated: >60 mL/min (ref >60)
Glucose, Bld: 94 mg/dL (ref 70–99)
Potassium: 4.4 mmol/L (ref 3.5–5.1)
Sodium: 140 mmol/L (ref 135–145)

## 2023-11-22 LAB — MAGNESIUM: Magnesium: 2.2 mg/dL (ref 1.7–2.4)

## 2023-11-22 MED ORDER — ONDANSETRON HCL 4 MG/2ML IJ SOLN
4.0000 mg | Freq: Once | INTRAMUSCULAR | Status: AC
  Administered 2023-11-22: 4 mg via INTRAVENOUS

## 2023-11-22 MED ORDER — METOPROLOL TARTRATE 5 MG/5ML IV SOLN
5.0000 mg | Freq: Once | INTRAVENOUS | Status: AC
  Administered 2023-11-22: 5 mg via INTRAVENOUS
  Filled 2023-11-22: qty 5

## 2023-11-22 MED ORDER — ONDANSETRON HCL 4 MG/2ML IJ SOLN
INTRAMUSCULAR | Status: AC
  Filled 2023-11-22: qty 2

## 2023-11-22 MED ORDER — ETOMIDATE 2 MG/ML IV SOLN
INTRAVENOUS | Status: AC | PRN
  Administered 2023-11-22: 10 mg via INTRAVENOUS

## 2023-11-22 MED ORDER — ETOMIDATE 2 MG/ML IV SOLN
10.0000 mg | Freq: Once | INTRAVENOUS | Status: AC
  Administered 2023-11-22: 10 mg via INTRAVENOUS
  Filled 2023-11-22: qty 10

## 2023-11-22 NOTE — ED Triage Notes (Signed)
 Pt POV from home. Stated last bp was 185/100. Pt is A&Ox4 and ambulatory to room. Deneis dizziness, headaches, or N/V.

## 2023-11-22 NOTE — Discharge Instructions (Signed)
 You were seen for your atrial fibrillation in the emergency department.  You had an electrical cardioversion  At home, please continue your Eliquis and metoprolol.  It is very important that you continue the Eliquis for the next month to prevent stroke  Check your MyChart online for the results of any tests that had not resulted by the time you left the emergency department.   Follow-up with your primary doctor in 2-3 days regarding your visit.  Follow-up with A-fib clinic  Return immediately to the emergency department if you experience any of the following: Palpitations, chest pain, or any other concerning symptoms.    Thank you for visiting our Emergency Department. It was a pleasure taking care of you today.

## 2023-11-22 NOTE — ED Provider Notes (Signed)
 [place] EMERGENCY DEPARTMENT AT [facility] Provider Note   CSN: [id number] Arrival date & time: 11/22/23  1005     Patient presents with: Hypertension   Johnny Mathews is a 63 y.o. male.   63 year old male history of atrial fibrillation on Eliquis and hypertension who presents emergency department elevated blood pressure.  Patient reports his blood pressure was 185 systolic at home.  Denies any headache, chest pain, shortness of breath.  No palpitations but checked his heart rate and it was in the 140s so he decided to come into the emergency department for evaluation.  Takes Eliquis for his atrial fibrillation and has not missed any doses in the past 3 weeks.  Last ate at 7:30 AM.         Prior to Admission medications   Medication Sig Start Date End Date Taking? Authorizing Provider  albuterol  (VENTOLIN  HFA) 108 (90 Base) MCG/ACT inhaler INHALE 2 PUFFS EVERY 4 HOURS AS NEEDED 12/11/22  Yes Dettinger, Fonda LABOR, MD  amitriptyline  (ELAVIL ) 100 MG tablet TAKE 2 TABLETS BY MOUTH AT BEDTIME 07/30/23  Yes Dettinger, Fonda LABOR, MD  budesonide -formoterol  (SYMBICORT ) 80-4.5 MCG/ACT inhaler Take 2 puffs first thing in am and then another 2 puffs about 12 hours later. 04/16/23  Yes Darlean Ozell NOVAK, MD  ELIQUIS 5 MG TABS tablet Take 5 mg by mouth 2 (two) times daily. 01/28/23  Yes [provider]  EPINEPHRINE  0.3 mg/0.3 mL IJ SOAJ injection INJECT 0.3ML (0.3MG ) IM ONCE Patient taking differently: Inject 0.3 mg into the muscle as needed for anaphylaxis. 03/16/23  Yes Dettinger, Fonda LABOR, MD  esomeprazole  (NEXIUM ) 40 MG capsule Take 1 capsule (40 mg total) by mouth daily at 12 noon. Take 30-60 min before first meal of the day 04/15/23  Yes Darlean Ozell NOVAK, MD  famotidine  (PEPCID ) 20 MG tablet TAKE ONE (1) TABLET BY MOUTH TWO (2) TIMES DAILY 10/14/23  Yes Dettinger, Fonda LABOR, MD  ferrous sulfate  325 (65 FE) MG tablet Take 325 mg by mouth 2 (two) times a week.   Yes [provider]  finasteride  (PROSCAR ) 5 MG tablet TAKE ONE (1) TABLET EACH DAY 12/11/22  Yes Dettinger, Fonda LABOR, MD  fluticasone  (FLONASE ) 50 MCG/ACT nasal spray USE 1 TO 2 SPRAYS IN EACH NOSTRIL DAILY 09/25/23  Yes Dettinger, Fonda LABOR, MD  furosemide  (LASIX ) 20 MG tablet Take 1 tablet (20 mg total) by mouth daily as needed. Patient taking differently: Take 20 mg by mouth daily. 03/05/23  Yes Dettinger, Fonda LABOR, MD  losartan  (COZAAR ) 50 MG tablet Take 1 tablet (50 mg total) by mouth daily. 03/05/23  Yes Dettinger, Fonda LABOR, MD  metoprolol succinate (TOPROL-XL) 25 MG 24 hr tablet Take 25 mg by mouth 2 (two) times daily. 12/11/22  Yes [provider]  montelukast  (SINGULAIR ) 10 MG tablet TAKE ONE TABLET DAILY AT BEDTIME 09/25/23  Yes Dettinger, Joshua A, MD  mupirocin ointment (BACTROBAN) 2 % Apply 1 Application topically 2 (two) times daily. 10/23/23  Yes [provider]  pravastatin  (PRAVACHOL ) 80 MG tablet Take 1 tablet (80 mg total) by mouth daily. 03/05/23  Yes Dettinger, Fonda LABOR, MD  Semaglutide , 1 MG/DOSE, (OZEMPIC , 1 MG/DOSE,) 4 MG/3ML SOPN INJECT 1MG  AS DIRECTED ONCE A WEEK Patient taking differently: Inject 1 mg into the skin every Sunday. 10/14/23  Yes Dettinger, Fonda LABOR, MD  spironolactone (ALDACTONE) 25 MG tablet Take 12.5 mg by mouth daily. 09/24/23  Yes [provider]  tadalafil  (CIALIS ) 10 MG tablet  TAKE 1 TABLET BY MOUTH EVERY OTHER DAY AS NEEDED 06/30/23  Yes Zollie Lowers, MD  tamsulosin  (FLOMAX ) 0.4 MG CAPS capsule TAKE 1 CAPSULE DAILY AFTER SUPPER Patient taking differently: Take 0.4 mg by mouth in the morning and at bedtime. 03/05/23  Yes Dettinger, Fonda LABOR, MD  testosterone  cypionate (DEPOTESTOSTERONE CYPIONATE) 200 MG/ML injection INJECT 100MG  (0.5ML) EVERY 10 DAYS (FOR INTRAMUSCULAR USE ONLY) 03/10/23  Yes Nida, Gebreselassie W, MD  blood glucose meter kit and supplies Dispense based on patient and insurance preference. Use up to four times daily as  directed. (FOR ICD-10 E10.9, E11.9). Pt states needs One Touch Verio Meter and one touch delica plus test strips 11/27/21   Dettinger, Fonda LABOR, MD  Blood Glucose Monitoring Suppl [facility] BLOOD GLUCOSE SYSTEM) w/Device KIT Test blood sugars four times daily 06/30/17   Dettinger, Fonda LABOR, MD  glucose blood (CONTOUR NEXT TEST) test strip Test blood sugars four times daily 06/30/17   Dettinger, Fonda LABOR, MD  Lancets [facility] CATHRYNE PLUS [place]) MISC  11/27/21   [provider]  Spacer/Aero-Holding Raguel FRENCH Use with inhaler daily 06/14/20   Icard, Adine CROME, DO  SYRINGE-NEEDLE, DISP, 3 ML 21G X 1-1/2 3 ML MISC Use to inject testosterone  every week 07/22/21   Nida, Gebreselassie W, MD    Allergies: Egg protein-containing drug products, Septra [sulfamethoxazole-trimethoprim], and Sulfonamide derivatives    Review of Systems  Updated Vital Signs BP 126/80   Pulse 78   Temp 98.2 F (36.8 C) (Oral)   Resp 16   Ht 5' 8 (1.727 m)   SpO2 95%   BMI 27.49 kg/m   Physical Exam Vitals and nursing note reviewed.  Constitutional:      General: He is not in acute distress.    Appearance: He is well-developed.  HENT:     Head: Normocephalic and atraumatic.     Right Ear: External ear normal.     Left Ear: External ear normal.     Nose: Nose normal.  Eyes:     Extraocular Movements: Extraocular movements intact.     Conjunctiva/sclera: Conjunctivae normal.     Pupils: Pupils are equal, round, and reactive to light.  Cardiovascular:     Rate and Rhythm: Tachycardia present. Rhythm irregular.     Heart sounds: Normal heart sounds.  Pulmonary:     Effort: Pulmonary effort is normal. No respiratory distress.     Breath sounds: Normal breath sounds.  Musculoskeletal:     Cervical back: Normal range of motion and neck supple.     Right lower leg: No edema.     Left lower leg: No edema.  Skin:    General: Skin is warm and dry.  Neurological:     Mental Status: He is alert and  oriented to person, place, and time. Mental status is at baseline.  Psychiatric:        Mood and Affect: Mood normal.        Behavior: Behavior normal.     (all labs ordered are listed, but only abnormal results are displayed) Labs Reviewed  CBC - Abnormal; Notable for the following components:      Result Value   RBC 3.50 (*)    Hemoglobin 11.3 (*)    HCT 34.8 (*)    All other components within normal limits  BASIC METABOLIC PANEL WITH GFR  MAGNESIUM     EKG: EKG Interpretation Date/Time:  Sunday November 22 2023 11:32:29 EST Ventricular Rate:  141 PR Interval:  129  QRS Duration:  116 QT Interval:  282 QTC Calculation: 432 R Axis:   -10  Text Interpretation: Sinus tachycardia Incomplete left bundle branch block Low voltage, precordial leads Consider anterior infarct Nonspecific ST abnormality Confirmed by Yolande Charleston [phone]) on 11/22/2023 11:46:38 AM  Radiology: [facility] Chest Port 1 View Result Date: 11/22/2023 EXAM: 1 VIEW(S) XRAY OF THE CHEST 11/22/2023 11:30:00 AM COMPARISON: 10/26/2023 CLINICAL HISTORY: palpitations FINDINGS: LINES, TUBES AND DEVICES: Implantable cardiac monitor. LUNGS AND PLEURA: No focal pulmonary opacity. No pleural effusion. No pneumothorax. HEART AND MEDIASTINUM: Implantable cardiac monitor. No acute abnormality of the cardiac and mediastinal silhouettes. BONES AND SOFT TISSUES: Thoracic degenerative changes. No acute osseous abnormality. IMPRESSION: 1. No acute process. Electronically signed by: Fonda Field MD 11/22/2023 11:45 AM EST RP Workstation: [id number]     .Cardioversion  Date/Time: 11/22/2023 2:11 PM  Performed by: Yolande Charleston BROCKS, MD Authorized by: Yolande Charleston BROCKS, MD   Consent:    Consent obtained:  Written   Consent given by:  Patient   Risks discussed:  Cutaneous burn, induced arrhythmia, death and pain   Alternatives discussed:  No treatment and rate-control medication Pre-procedure details:    Cardioversion basis:   Emergent   Rhythm:  Atrial fibrillation   Electrode placement:  Anterior-posterior Patient sedated: Yes. Refer to sedation procedure documentation for details of sedation.  Attempt one:    Cardioversion mode:  Synchronous   Waveform:  Monophasic   Shock (Joules):  120   Shock outcome:  Conversion to normal sinus rhythm Post-procedure details:    Patient status:  Awake   Patient tolerance of procedure:  Tolerated well, no immediate complications .Sedation  Date/Time: 11/22/2023 2:11 PM  Performed by: Yolande Charleston BROCKS, MD Authorized by: Yolande Charleston BROCKS, MD   Consent:    Consent obtained:  Written and verbal   Consent given by:  Patient   Risks discussed:  Allergic reaction, prolonged hypoxia resulting in organ damage, dysrhythmia, prolonged sedation necessitating reversal, inadequate sedation, respiratory compromise necessitating ventilatory assistance and intubation, nausea and vomiting Universal protocol:    Immediately prior to procedure, a time out was called: yes   Indications:    Procedure performed:  Cardioversion Pre-sedation assessment:    Time since last food or drink:  6 hours   ASA classification: class 2 - patient with mild systemic disease     Mouth opening:  2 finger widths   Thyromental distance:  3 finger widths   Mallampati score:  III - soft palate, base of uvula visible   Neck mobility: normal     Pre-sedation assessments completed and reviewed: pre-procedure airway patency not reviewed, pre-procedure mental status not reviewed and pre-procedure respiratory function not reviewed   A pre-sedation assessment was completed prior to the start of the procedure Immediate pre-procedure details:    Reassessment: Patient reassessed immediately prior to procedure     Verified: bag valve mask available, emergency equipment available, IV patency confirmed, oxygen available and suction available   Procedure details (see MAR for exact dosages):    Preoxygenation:   Nasal cannula   Sedation:  Etomidate   Intended level of sedation: deep   Intra-procedure monitoring:  Blood pressure monitoring, continuous capnometry, frequent LOC assessments, cardiac monitor, continuous pulse oximetry and frequent vital sign checks   Intra-procedure events: none     Total Provider sedation time (minutes):  15 Post-procedure details:   A post-sedation assessment was completed following the completion of the procedure.   Attendance: Constant attendance by certified staff  until patient recovered     Recovery: Patient returned to pre-procedure baseline     Post-sedation assessments completed and reviewed: post-procedure airway patency not reviewed, post-procedure mental status not reviewed and post-procedure respiratory function not reviewed     Patient is stable for discharge or admission: yes     Procedure completion:  Tolerated well, no immediate complications    Medications Ordered in the ED  metoprolol tartrate (LOPRESSOR) injection 5 mg (5 mg Intravenous Given 11/22/23 1242)  etomidate (AMIDATE) injection 10 mg (10 mg Intravenous Given 11/22/23 1359)  etomidate (AMIDATE) injection (10 mg Intravenous Given 11/22/23 1358)  ondansetron  (ZOFRAN ) injection 4 mg (4 mg Intravenous See Procedure Record 11/22/23 1415)                                    Medical Decision Making Amount and/or Complexity of Data Reviewed Labs: ordered. Radiology: ordered.  Risk Prescription drug management.   63 year old male history of atrial fibrillation on Eliquis and hypertension presents to the emergency department with concern for elevated blood pressure and heart rate  Initial Ddx:  Atrial fibrillation with RVR, hypertension, hypertensive emergency, PE, MI  MDM/Course:  Patient presents to the emergency department with atrial fibrillation RVR.  Also reported hypertension at home but his blood pressure is not significantly elevated here.  No signs of hypertensive emergency.  He  was given IV metoprolol without improvement of his heart rate.  He was monitored in the emergency department until he had been n.p.o. for 6 hours and then underwent synchronized cardioversion with 120 J and etomidate for sedation.  Successfully converted to normal sinus rhythm.  Was monitored and upon re-evaluation was back to his baseline.  Also had a chest x-ray and blood work that did not reveal acute abnormality.  Will have him follow-up with his primary doctor and A-fib clinic and continue his metoprolol and Eliquis  This patient presents to the ED for concern of complaints listed in HPI, this involves an extensive number of treatment options, and is a complaint that carries with it a high risk of complications and morbidity. Disposition including potential need for admission considered.   Dispo: DC Home. Return precautions discussed including, but not limited to, those listed in the AVS. Allowed pt time to ask questions which were answered fully prior to dc.  Records reviewed Outpatient Clinic Notes The following labs were independently interpreted: Chemistry and show no acute abnormality I independently reviewed the following imaging with scope of interpretation limited to determining acute life threatening conditions related to emergency care: Chest x-ray and agree with the radiologist interpretation with the following exceptions: none I personally reviewed and interpreted cardiac monitoring: atrial fibrillation with RVR I personally reviewed and interpreted the pt's EKG: see above for interpretation  I have reviewed the patients home medications and made adjustments as needed Social Determinants of health:  Geriatric  CRITICAL CARE Performed by: Lamar JAYSON Shan   Total critical care time: 30 minutes  Critical care time was exclusive of separately billable procedures and treating other patients.  Critical care was necessary to treat or prevent imminent or life-threatening  deterioration.  Critical care was time spent personally by me on the following activities: development of treatment plan with patient and/or surrogate as well as nursing, discussions with consultants, evaluation of patient's response to treatment, examination of patient, obtaining history from patient or surrogate, ordering and performing treatments and interventions, ordering  and review of laboratory studies, ordering and review of radiographic studies, pulse oximetry and re-evaluation of patient's condition.   Portions of this note were generated with Scientist, clinical (histocompatibility and immunogenetics). Dictation errors may occur despite best attempts at proofreading.     Final diagnoses:  Atrial fibrillation with RVR [facility])  Encounter for cardioversion procedure    ED Discharge Orders          Ordered    Amb Referral to AFIB Clinic        11/22/23 1505               Yolande Lamar BROCKS, MD 11/22/23 [phone]

## 2023-11-25 ENCOUNTER — Encounter: Payer: Self-pay | Admitting: "Endocrinology

## 2023-11-25 ENCOUNTER — Ambulatory Visit: Admitting: "Endocrinology

## 2023-11-25 VITALS — BP 128/76 | HR 64 | Ht 68.0 in | Wt 187.0 lb

## 2023-11-25 DIAGNOSIS — E782 Mixed hyperlipidemia: Secondary | ICD-10-CM

## 2023-11-25 DIAGNOSIS — E119 Type 2 diabetes mellitus without complications: Secondary | ICD-10-CM

## 2023-11-25 DIAGNOSIS — E291 Testicular hypofunction: Secondary | ICD-10-CM | POA: Diagnosis not present

## 2023-11-25 NOTE — Progress Notes (Signed)
 Milligrams every 10 days testosterone  he was just in the ER blood pressure have back in atrial fibrillation cardioverted him again that he is advised to see specialists Monday for rapid atrial fibrillation changes thanks

## 2023-11-25 NOTE — Progress Notes (Signed)
 11/25/2023     Endocrinology follow-up note   Johnny Mathews, 63 y.o., male   Chief Complaint  Patient presents with   Follow-up    Hypogonadism, male     Past Medical History:  Diagnosis Date   Allergy    Alpha galactosidase deficiency    Anemia    past hx    Arthritis    Asthma    Atrial fibrillation (HCC)    Blood transfusion without reported diagnosis    BPH (benign prostatic hypertrophy)    Cataract    removed left eye    Complication of anesthesia    Diabetes mellitus without complication (HCC)    Dysrhythmia    GERD (gastroesophageal reflux disease)    Heart murmur    History of kidney stones    HOH (hard of hearing)    Hyperlipidemia    Hypertension    Irregular heart beat    Muscular dystrophy (HCC)    PONV (postoperative nausea and vomiting)    Sleep apnea    Wears hearing aid in both ears    Past Surgical History:  Procedure Laterality Date   CARDIAC ELECTROPHYSIOLOGY STUDY AND ABLATION  11/2022   CATARACT EXTRACTION Left    COLONOSCOPY     ESOPHAGOGASTRODUODENOSCOPY N/A 08/27/2020   Procedure: ESOPHAGOGASTRODUODENOSCOPY (EGD);  Surgeon: Shyrl Linnie KIDD, MD;  Location: [facility] OR;  Service: Thoracic;  Laterality: N/A;   FEMUR FRACTURE SURGERY     INGUINAL HERNIA REPAIR Right 08/07/2021   Procedure: HERNIA REPAIR INGUINAL ADULT WITH MESH;  Surgeon: Kallie Manuelita BROCKS, MD;  Location: [facility];  Service: General;  Laterality: Right;   INSERTION OF MESH N/A 08/27/2020   Procedure: INSERTION OF ACELL 7.5 x 6cm GENTRIX SURGICAL MATRIX HIATAL MESH;  Surgeon: Shyrl Linnie KIDD, MD;  Location: [facility] OR;  Service: Thoracic;  Laterality: N/A;   KIDNEY STONE SURGERY     x6   KNEE SURGERY Right    x2   MASS EXCISION Right 08/02/2018   Procedure: EXCISION RIGHT LONG FINGER MASS, DEBRIDEMENT PROXIMAL INTERPHALANGEAL JOINT WITH ROTATION FLAP;  Surgeon: Murrell Drivers, MD;  Location: [facility];  Service:  Orthopedics;  Laterality: Right;   NISSEN FUNDOPLICATION     [facility]    SHOULDER OPEN ROTATOR CUFF REPAIR     XI ROBOTIC ASSISTED HIATAL HERNIA REPAIR N/A 08/27/2020   Procedure: XI ROBOTIC ASSISTED REDO HIATAL HERNIA REPAIR;  Surgeon: Shyrl Linnie KIDD, MD;  Location: [facility] OR;  Service: Thoracic;  Laterality: N/A;   Social History   Socioeconomic History   Marital status: Married    Spouse name: Not on file   Number of children: Not on file   Years of education: Not on file   Highest education level: Not on file  Occupational History   Occupation: designer, industrial/product  Tobacco Use   Smoking status: Former    Current packs/day: 0.00    Average packs/day: 1 pack/day for 10.0 years (10.0 ttl pk-yrs)    Types: Cigarettes    Start date: 01/07/1984    Quit date: 01/06/1994    Years since quitting: 29.9    Passive exposure: Past   Smokeless tobacco: Current    Types: Snuff  Vaping Use   Vaping status: Never Used  Substance and Sexual Activity   Alcohol use: Yes    Alcohol/week: 2.0 - 3.0 standard drinks of alcohol    Types: 2 - 3 Standard drinks or equivalent per week    Comment: social  Drug use: No   Sexual activity: Not on file  Other Topics Concern   Not on file  Social History Narrative   Not on file   Social Drivers of Health   Financial Resource Strain: Medium Risk (06/21/2023)   Received from [facility]   Overall Financial Resource Strain (CARDIA)    How hard is it for you to pay for the very basics like food, housing, medical care, and heating?: Somewhat hard  Food Insecurity: No Food Insecurity (06/21/2023)   Received from [facility]   Hunger Vital Sign    Within the past 12 months, you worried that your food would run out before you got the money to buy more.: Never true    Within the past 12 months, the food you bought just didn't last and you didn't have money to get more.: Never true  Transportation Needs: No Transportation Needs (06/21/2023)   Received  from [facility]   PRAPARE - Transportation    Lack of Transportation (Medical): No    Lack of Transportation (Non-Medical): No  Physical Activity: Not on file  Stress: Not on file  Social Connections: Not on file   Outpatient Encounter Medications as of 11/25/2023  Medication Sig   albuterol  (VENTOLIN  HFA) 108 (90 Base) MCG/ACT inhaler INHALE 2 PUFFS EVERY 4 HOURS AS NEEDED   amitriptyline  (ELAVIL ) 100 MG tablet TAKE 2 TABLETS BY MOUTH AT BEDTIME   blood glucose meter kit and supplies Dispense based on patient and insurance preference. Use up to four times daily as directed. (FOR ICD-10 E10.9, E11.9). Pt states needs One Touch Verio Meter and one touch delica plus test strips   Blood Glucose Monitoring Suppl (CONTOUR BLOOD GLUCOSE SYSTEM) w/Device KIT Test blood sugars four times daily   budesonide -formoterol  (SYMBICORT ) 80-4.5 MCG/ACT inhaler Take 2 puffs first thing in am and then another 2 puffs about 12 hours later.   ELIQUIS 5 MG TABS tablet Take 5 mg by mouth 2 (two) times daily.   EPINEPHRINE  0.3 mg/0.3 mL IJ SOAJ injection INJECT 0.3ML (0.3MG ) IM ONCE (Patient taking differently: Inject 0.3 mg into the muscle as needed for anaphylaxis.)   esomeprazole  (NEXIUM ) 40 MG capsule Take 1 capsule (40 mg total) by mouth daily at 12 noon. Take 30-60 min before first meal of the day   famotidine  (PEPCID ) 20 MG tablet TAKE ONE (1) TABLET BY MOUTH TWO (2) TIMES DAILY   ferrous sulfate  325 (65 FE) MG tablet Take 325 mg by mouth 2 (two) times a week.   finasteride  (PROSCAR ) 5 MG tablet TAKE ONE (1) TABLET EACH DAY   fluticasone  (FLONASE ) 50 MCG/ACT nasal spray USE 1 TO 2 SPRAYS IN EACH NOSTRIL DAILY   furosemide  (LASIX ) 20 MG tablet Take 1 tablet (20 mg total) by mouth daily as needed. (Patient taking differently: Take 20 mg by mouth daily.)   glucose blood (CONTOUR NEXT TEST) test strip Test blood sugars four times daily   Lancets (ONETOUCH DELICA PLUS LANCET33G) MISC    losartan  (COZAAR ) 50  MG tablet Take 1 tablet (50 mg total) by mouth daily.   metoprolol  succinate (TOPROL -XL) 25 MG 24 hr tablet Take 25 mg by mouth 2 (two) times daily.   montelukast  (SINGULAIR ) 10 MG tablet TAKE ONE TABLET DAILY AT BEDTIME   mupirocin ointment (BACTROBAN) 2 % Apply 1 Application topically 2 (two) times daily.   pravastatin  (PRAVACHOL ) 80 MG tablet Take 1 tablet (80 mg total) by mouth daily.   Semaglutide , 1  MG/DOSE, (OZEMPIC , 1 MG/DOSE,) 4 MG/3ML SOPN INJECT 1MG  AS DIRECTED ONCE A WEEK (Patient taking differently: Inject 1 mg into the skin every Sunday.)   Spacer/Aero-Holding Raguel FRENCH Use with inhaler daily   spironolactone (ALDACTONE) 25 MG tablet Take 12.5 mg by mouth daily.   SYRINGE-NEEDLE, DISP, 3 ML 21G X 1-1/2 3 ML MISC Use to inject testosterone  every week   tadalafil  (CIALIS ) 10 MG tablet TAKE 1 TABLET BY MOUTH EVERY OTHER DAY AS NEEDED   tamsulosin  (FLOMAX ) 0.4 MG CAPS capsule TAKE 1 CAPSULE DAILY AFTER SUPPER (Patient taking differently: Take 0.4 mg by mouth in the morning and at bedtime.)   testosterone  cypionate (DEPOTESTOSTERONE CYPIONATE) 200 MG/ML injection INJECT 100MG  (0.5ML) EVERY 10 DAYS (FOR INTRAMUSCULAR USE ONLY)   No facility-administered encounter medications on file as of 11/25/2023.   ALLERGIES: Allergies  Allergen Reactions   Egg Protein-Containing Drug Products Anaphylaxis and Swelling    03-31-2019 pt states can eat cakes and Pie with no issues    Septra [Sulfamethoxazole-Trimethoprim] Anaphylaxis and Swelling   Sulfonamide Derivatives Hives    VACCINATION STATUS: Immunization History  Administered Date(s) Administered   INFLUENZA, HIGH DOSE SEASONAL PF 10/08/2023   Influenza Inj Mdck Quad Pf 09/24/2018, 10/22/2020   Influenza, Quadrivalent, Recombinant, Inj, Pf 01/15/2017   Influenza, Seasonal, Injecte, Preservative Fre 12/11/2022   Influenza-Unspecified 09/24/2018, 10/31/2019   Moderna SARS-COV2 Booster Vaccination 04/26/2020   Moderna Sars-Covid-2  Vaccination 01/15/2019, 02/12/2019, 09/07/2019   PNEUMOCOCCAL CONJUGATE-20 07/27/2023   Pneumococcal Polysaccharide-23 11/09/2019   Tdap 07/20/2009, 11/09/2019   Zoster Recombinant(Shingrix) 08/22/2020, 02/22/2021    HPI: LAJARVIS ITALIANO is a 63 y.o.-year-old man.  He is returning for a follow-up after he was seen in consultation for hypogonadism.  He is on ongoing testosterone  replacement therapy.  In the interval, he developed hypertensive urgency associated with atrial fibrillation status post repeat cardioversion awaiting for outpatient cardiology evaluation.  He does not have acute complaints today.  He is currently on testosterone  100 mg IM every 10 days his previsit labs show total testosterone  617 nanograms per DL.  He notes from previous visits. He reports that he has hypogonadism for as long as he remembers, does not father any biological children although he adopted 3 kids.  He reports to have been told that he cannot father any children due to low sperm count.  He reports consistency with injection of his testosterone  every other week.  He denies any deficit of sense of smell.  He denies  trauma to testes,  chemotherapy,  testicular irradiation,  nor genitourinary surgery. Denies new complaints since last visit.  He wishes to be continued on testosterone  treatment.   -His recent labs show normal CBC, PSA   -  He has asthma on various inhalers. No chronic pain. Not on opiates, does not take steroids.    He does not have family  Or personal history of premature  cardiac disease.  ROS: Limited as above.  PE: BP 128/76   Pulse 64   Ht 5' 8 (1.727 m)   Wt 187 lb (84.8 kg)   BMI 28.43 kg/m  Wt Readings from Last 3 Encounters:  11/25/23 187 lb (84.8 kg)  10/26/23 180 lb 12.8 oz (82 kg)  10/19/23 180 lb 12.8 oz (82 kg)    Genital exam: normal male escutcheon, no inguinal LAD, normal phallus, significantly shrunk testes bilaterally to 5 mL, no testicular or scrotal mass  lesions.  no penile discharge.  No gynecomastia.   CMP ( most  recent) CMP     Component Value Date/Time   NA 140 11/22/2023 1118   NA 139 07/16/2023 1249   K 4.4 11/22/2023 1118   CL 106 11/22/2023 1118   CO2 24 11/22/2023 1118   GLUCOSE 94 11/22/2023 1118   BUN 17 11/22/2023 1118   BUN 21 07/16/2023 1249   CREATININE 1.05 11/22/2023 1118   CALCIUM 9.0 11/22/2023 1118   PROT 6.9 10/26/2023 1605   PROT 6.7 07/16/2023 1249   ALBUMIN 4.3 10/26/2023 1605   ALBUMIN 4.3 07/16/2023 1249   AST 35 10/26/2023 1605   ALT 37 10/26/2023 1605   ALKPHOS 159 (H) 10/26/2023 1605   BILITOT 0.3 10/26/2023 1605   BILITOT 0.3 07/16/2023 1249   GFRNONAA >60 11/22/2023 1118   GFRAA 74 02/23/2020 0929     Diabetic Labs (most recent): Lab Results  Component Value Date   HGBA1C 4.8 07/09/2023   HGBA1C 4.8 03/05/2023   HGBA1C 5.2 03/13/2022     Lipid Panel ( most recent) Lipid Panel     Component Value Date/Time   CHOL 179 07/09/2023 1147   TRIG 126 07/09/2023 1147   HDL 69 07/09/2023 1147   CHOLHDL 2.6 07/09/2023 1147   CHOLHDL 4.8 03/30/2009 1807   VLDL 20 03/30/2009 1807   LDLCALC 88 07/09/2023 1147    Recent Results (from the past 2160 hours)  CBC with Differential     Status: Abnormal   Collection Time: 10/26/23  4:05 PM  Result Value Ref Range   WBC 6.0 4.0 - 10.5 K/uL   RBC 3.37 (L) 4.22 - 5.81 MIL/uL   Hemoglobin 10.9 (L) 13.0 - 17.0 g/dL   HCT 66.2 (L) 60.9 - 47.9 %   MCV 100.0 80.0 - 100.0 fL   MCH 32.3 26.0 - 34.0 pg   MCHC 32.3 30.0 - 36.0 g/dL   RDW 86.5 88.4 - 84.4 %   Platelets 222 150 - 400 K/uL   nRBC 0.0 0.0 - 0.2 %   Neutrophils Relative % 59 %   Neutro Abs 3.6 1.7 - 7.7 K/uL   Lymphocytes Relative 21 %   Lymphs Abs 1.3 0.7 - 4.0 K/uL   Monocytes Relative 9 %   Monocytes Absolute 0.5 0.1 - 1.0 K/uL   Eosinophils Relative 9 %   Eosinophils Absolute 0.5 0.0 - 0.5 K/uL   Basophils Relative 1 %   Basophils Absolute 0.0 0.0 - 0.1 K/uL   Immature  Granulocytes 1 %   Abs Immature Granulocytes 0.04 0.00 - 0.07 K/uL    Comment: Performed at [facility], [address], [place], [place] [address]  Comprehensive metabolic panel     Status: Abnormal   Collection Time: 10/26/23  4:05 PM  Result Value Ref Range   Sodium 141 135 - 145 mmol/L   Potassium 4.6 3.5 - 5.1 mmol/L   Chloride 106 98 - 111 mmol/L   CO2 25 22 - 32 mmol/L   Glucose, Bld 86 70 - 99 mg/dL    Comment: Glucose reference range applies only to samples taken after fasting for at least 8 hours.   BUN 22 8 - 23 mg/dL   Creatinine, Ser 8.55 (H) 0.61 - 1.24 mg/dL   Calcium 8.9 8.9 - 89.6 mg/dL   Total Protein 6.9 6.5 - 8.1 g/dL   Albumin 4.3 3.5 - 5.0 g/dL   AST 35 15 - 41 U/L   ALT 37 0 - 44 U/L   Alkaline Phosphatase 159 (H) 38 - 126  U/L   Total Bilirubin 0.3 0.0 - 1.2 mg/dL   GFR, Estimated 54 (L) >60 mL/min    Comment: (NOTE) Calculated using the CKD-EPI Creatinine Equation (2021)    Anion gap 10 5 - 15    Comment: Performed at [facility], [address], [place], [place] [address]  TSH     Status: None   Collection Time: 10/26/23  4:05 PM  Result Value Ref Range   TSH 1.330 0.350 - 4.500 uIU/mL    Comment: Performed at [facility], [address], [place], [place] [address]  Testosterone , Free, Total, SHBG     Status: Abnormal   Collection Time: 11/18/23 10:09 AM  Result Value Ref Range   Testosterone  617 264 - 916 ng/dL    Comment: Adult male reference interval is based on a population of healthy nonobese males (BMI <30) between 19 and 58 years old. Travison, et.al. JCEM [phone]. PMID: 71675896.    Testosterone , Free 6.6 6.6 - 18.1 pg/mL   Sex Hormone Binding 84.5 (H) 19.3 - 76.4 nmol/L  PSA     Status: None   Collection Time: 11/18/23 10:09 AM  Result Value Ref Range   Prostate Specific Ag, Serum 0.7 0.0 - 4.0 ng/mL    Comment: Roche ECLIA methodology. According to the American Urological Association, Serum PSA should decrease and remain at  undetectable levels after radical prostatectomy. The AUA defines biochemical recurrence as an initial PSA value 0.2 ng/mL or greater followed by a subsequent confirmatory PSA value 0.2 ng/mL or greater. Values obtained with different assay methods or kits cannot be used interchangeably. Results cannot be interpreted as absolute evidence of the presence or absence of malignant disease.   Prolactin     Status: None   Collection Time: 11/18/23 10:09 AM  Result Value Ref Range   Prolactin 9.8 3.6 - 25.2 ng/mL  Basic metabolic panel     Status: None   Collection Time: 11/22/23 11:18 AM  Result Value Ref Range   Sodium 140 135 - 145 mmol/L   Potassium 4.4 3.5 - 5.1 mmol/L   Chloride 106 98 - 111 mmol/L   CO2 24 22 - 32 mmol/L   Glucose, Bld 94 70 - 99 mg/dL    Comment: Glucose reference range applies only to samples taken after fasting for at least 8 hours.   BUN 17 8 - 23 mg/dL   Creatinine, Ser 8.94 0.61 - 1.24 mg/dL   Calcium 9.0 8.9 - 89.6 mg/dL   GFR, Estimated >39 >39 mL/min    Comment: (NOTE) Calculated using the CKD-EPI Creatinine Equation (2021)    Anion gap 11 5 - 15    Comment: Performed at [facility], [address] [place], [place] [address]  Magnesium      Status: None   Collection Time: 11/22/23 11:18 AM  Result Value Ref Range   Magnesium  2.2 1.7 - 2.4 mg/dL    Comment: Performed at [facility], [address] [place], [place] [address]  CBC     Status: Abnormal   Collection Time: 11/22/23 11:18 AM  Result Value Ref Range   WBC 5.5 4.0 - 10.5 K/uL   RBC 3.50 (L) 4.22 - 5.81 MIL/uL   Hemoglobin 11.3 (L) 13.0 - 17.0 g/dL   HCT 65.1 (L) 60.9 - 47.9 %   MCV 99.4 80.0 - 100.0 fL   MCH 32.3 26.0 - 34.0 pg   MCHC 32.5 30.0 - 36.0 g/dL   RDW 86.7 88.4 - 84.4 %  Platelets 201 150 - 400 K/uL   nRBC 0.0 0.0 - 0.2 %    Comment: Performed at [facility], [address] [place], [place] [address]     ASSESSMENT:  1. Hypogonadism-primary testicular  hypofunction   PLAN:   See notes from previous visits.  He returns with acceptable total testosterone  level at 617, progressively improving from 93.  His previsit labs are favorable.  He is advised to continue testosterone   100 mg IM every 10 days with plan to repeat total testosterone  before his next visit in 4 months.  This will give him a total of 300 mg of testosterone  monthly.    -  I discussed adverse effects of unnecessary testosterone  replacement short-term and long-term.  In this patient with bilaterally shrunk testicles, his hypogonadism is likely chronic and primary.  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 I discussed with him the fact that testosterone  replacement will further diminish his chance of fertility.  At this point, he is not interested to keep his fertility.  -Given his medical history of sleep apnea and BPH which are relative contraindications for testosterone  replacement, his testosterone  dose will be titrated slowly based on his clinical response.    -He is already on PDE 5 inhibitors for ED.  He is advised to stay on pravastatin  80 mg p.o. nightly for hyperlipidemia.    Patient does have history of diabetes,  which seems to have reversed.  His A1c was 4.8% in May 2023.  He is now on Ozempic  1 mg subcutaneously weekly.  He is being cared for for by his PMD for his diabetes.  He is encouraged to keep his appointment with cardiology given his recent A-fib.   He is encouraged to keep close follow-up with his PMD.   I spent  20  minutes in the care of the patient today including review of labs from Thyroid  Function, CMP, and other relevant labs ; imaging/biopsy records (current and previous including abstractions from other facilities); face-to-face time discussing  his lab results and symptoms, medications doses, his options of short and long term treatment based on the latest standards of care / guidelines;    and documenting the encounter.  Johnny Mathews  participated in the discussions, expressed understanding, and voiced agreement with the above plans.  All questions were answered to his satisfaction. he is encouraged to contact clinic should he have any questions or concerns prior to his return visit.   Return in about 4 months (around 03/24/2024) for Fasting Labs  in AM B4 8.  Ranny Earl, MD [facility] [address] [place], [place] [address] Phone: [phone]  Fax: [phone]   11/25/2023, 9:29 AM  This note was partially dictated with voice recognition software. Similar sounding words can be transcribed inadequately or may not  be corrected upon review.

## 2023-11-30 ENCOUNTER — Ambulatory Visit (HOSPITAL_COMMUNITY)
Admission: RE | Admit: 2023-11-30 | Discharge: 2023-11-30 | Disposition: A | Discharge status: Home / Self Care | Source: Ambulatory Visit | Attending: Physician Assistant | Admitting: Physician Assistant

## 2023-11-30 VITALS — BP 136/80 | HR 61 | Ht 68.0 in | Wt 189.4 lb

## 2023-11-30 DIAGNOSIS — I4891 Unspecified atrial fibrillation: Secondary | ICD-10-CM

## 2023-11-30 DIAGNOSIS — D6869 Other thrombophilia: Secondary | ICD-10-CM | POA: Diagnosis not present

## 2023-11-30 DIAGNOSIS — I4819 Other persistent atrial fibrillation: Secondary | ICD-10-CM | POA: Diagnosis not present

## 2023-11-30 DIAGNOSIS — I480 Paroxysmal atrial fibrillation: Secondary | ICD-10-CM

## 2023-11-30 NOTE — Progress Notes (Signed)
 Primary Care Physician: Dettinger, Fonda LABOR, MD Primary Cardiologist: None Electrophysiologist: None  Referring Physician: ED   Johnny Mathews is a 63 y.o. 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  The patient has a Engineer, Agricultural and has been followed by Dr Leni at [facility]. He is s/p afib ablation 04/04/21. Patient is on Eliquis for stroke prevention.    Patient presents today for follow up for atrial fibrillation. He was seen at the ED 10/20 and 11/16 for afib and underwent DCCV at both visits. There were no specific triggers that he could identify. No bleeding issues on anticoagulation.   Today, he denies symptoms of palpitations, chest pain, shortness of breath, orthopnea, PND, lower extremity edema, dizziness, presyncope, syncope, bleeding, or neurologic sequela. The patient is tolerating medications without difficulties and is otherwise without complaint today.    Atrial Fibrillation Risk Factors:  he does have symptoms or diagnosis of sleep apnea. he does not have a history of rheumatic fever. he does have a history of alcohol use. ~1 beer daily The patient does have a history of early familial atrial fibrillation or other arrhythmias. Mother patient in AF clinic.   Atrial Fibrillation Management history:  Previous antiarrhythmic drugs: none Previous cardioversions: 01/03/22, 10/26/23, 11/22/23 Previous ablations: 04/04/21 Anticoagulation history: Eliquis  ROS- All systems are reviewed and negative except as per the HPI above.  Past Medical History:  Diagnosis Date   Allergy    Alpha galactosidase deficiency    Anemia    past hx    Arthritis    Asthma    Atrial fibrillation (HCC)    Blood transfusion without reported diagnosis    BPH (benign prostatic hypertrophy)    Cataract    removed left  eye    Complication of anesthesia    Diabetes mellitus without complication (HCC)    Dysrhythmia    GERD (gastroesophageal reflux disease)    Heart murmur    History of kidney stones    HOH (hard of hearing)    Hyperlipidemia    Hypertension    Irregular heart beat    Muscular dystrophy (HCC)    PONV (postoperative nausea and vomiting)    Sleep apnea    Wears hearing aid in both ears     Current Outpatient Medications  Medication Sig Dispense Refill   albuterol  (VENTOLIN  HFA) 108 (90 Base) MCG/ACT inhaler INHALE 2 PUFFS EVERY 4 HOURS AS NEEDED 8.5 g 2   amitriptyline  (ELAVIL ) 100 MG tablet TAKE 2 TABLETS BY MOUTH AT BEDTIME 60 tablet 5   blood glucose meter kit and supplies Dispense based on patient and insurance preference. Use up to four times daily as directed. (FOR ICD-10 E10.9, E11.9). Pt states needs One Touch Verio Meter and one touch delica plus test strips 1 each 0   Blood Glucose Monitoring Suppl (CONTOUR BLOOD GLUCOSE SYSTEM) w/Device KIT Test blood sugars four times daily 200 each 5   budesonide -formoterol  (SYMBICORT ) 80-4.5 MCG/ACT inhaler Take 2 puffs first thing in am and then another 2 puffs about 12 hours later. 10.2 g 12   cetirizine (ZYRTEC) 10 MG tablet Take 10 mg by mouth every evening.     ELIQUIS 5 MG TABS tablet Take 5 mg by mouth 2 (two) times daily.     EPINEPHRINE  0.3 mg/0.3 mL IJ SOAJ injection INJECT 0.3ML (0.3MG ) IM  ONCE 2 each 1   esomeprazole  (NEXIUM ) 40 MG capsule Take 1 capsule (40 mg total) by mouth daily at 12 noon. Take 30-60 min before first meal of the day 30 capsule 11   famotidine  (PEPCID ) 20 MG tablet TAKE ONE (1) TABLET BY MOUTH TWO (2) TIMES DAILY 180 tablet 0   finasteride  (PROSCAR ) 5 MG tablet TAKE ONE (1) TABLET EACH DAY 90 tablet 1   fluticasone  (FLONASE ) 50 MCG/ACT nasal spray USE 1 TO 2 SPRAYS IN EACH NOSTRIL DAILY 48 g 1   furosemide  (LASIX ) 20 MG tablet Take 1 tablet (20 mg total) by mouth daily as needed. 90 tablet 3   glucose blood  (CONTOUR NEXT TEST) test strip Test blood sugars four times daily 200 each 5   ketoconazole (NIZORAL) 2 % cream SMARTSIG:1 Application Topical 1 to 2 Times Daily     Lancets (ONETOUCH DELICA PLUS LANCET33G) MISC      losartan  (COZAAR ) 50 MG tablet Take 1 tablet (50 mg total) by mouth daily. 90 tablet 3   metoprolol  succinate (TOPROL -XL) 25 MG 24 hr tablet Take 25 mg by mouth 2 (two) times daily.     montelukast  (SINGULAIR ) 10 MG tablet TAKE ONE TABLET DAILY AT BEDTIME 90 tablet 1   mupirocin ointment (BACTROBAN) 2 % Apply 1 Application topically 2 (two) times daily.     pravastatin  (PRAVACHOL ) 80 MG tablet Take 1 tablet (80 mg total) by mouth daily. 90 tablet 3   Semaglutide , 1 MG/DOSE, (OZEMPIC , 1 MG/DOSE,) 4 MG/3ML SOPN INJECT 1MG  AS DIRECTED ONCE A WEEK 9 mL 0   Spacer/Aero-Holding Chambers DEVI Use with inhaler daily 1 each 0   spironolactone (ALDACTONE) 25 MG tablet Take 12.5 mg by mouth daily.     SYRINGE-NEEDLE, DISP, 3 ML 21G X 1-1/2 3 ML MISC Use to inject testosterone  every week 50 each 0   tadalafil  (CIALIS ) 10 MG tablet TAKE 1 TABLET BY MOUTH EVERY OTHER DAY AS NEEDED 30 tablet 2   tamsulosin  (FLOMAX ) 0.4 MG CAPS capsule TAKE 1 CAPSULE DAILY AFTER SUPPER (Patient taking differently: Take 0.4 mg by mouth every morning.) 90 capsule 3   testosterone  cypionate (DEPOTESTOSTERONE CYPIONATE) 200 MG/ML injection INJECT 100MG  (0.5ML) EVERY 10 DAYS (FOR INTRAMUSCULAR USE ONLY) 10 mL 0   ferrous sulfate  325 (65 FE) MG tablet Take 325 mg by mouth 2 (two) times a week. (Patient not taking: Reported on 11/30/2023)     No current facility-administered medications for this encounter.    Physical Exam: BP 136/80   Pulse 61   Ht 5' 8 (1.727 m)   Wt 85.9 kg   BMI 28.80 kg/m   GEN: Well nourished, well developed in no acute distress CARDIAC: Regular rate and rhythm with occasional ectopy, no rubs, gallops. 2/6 systolic murmur  RESPIRATORY:  Clear to auscultation without rales, wheezing or  rhonchi  ABDOMEN: Soft, non-tender, non-distended EXTREMITIES:  No edema; No deformity   Wt Readings from Last 3 Encounters:  11/30/23 85.9 kg  11/25/23 84.8 kg  10/26/23 82 kg     EKG Interpretation Date/Time:  Monday November 30 2023 09:04:16 EST Ventricular Rate:  61 PR Interval:  148 QRS Duration:  106 QT Interval:  412 QTC Calculation: 414 R Axis:   -6  Text Interpretation: Sinus rhythm with Premature atrial complexes Low voltage QRS Cannot rule out Anterior infarct , age undetermined Abnormal ECG Confirmed by Nellene, Rosco Harriott ([id number]) on 11/30/2023 9:18:59 AM    Echo 04/26/21 [facility]) demonstrated  Summary  1. The left ventricle is normal in size with normal wall thickness.    2. The left ventricular systolic function is normal, LVEF is visually  estimated at 60-65%.    3. There is mild to moderate aortic valve stenosis.    4. The left atrium is mildly dilated in size.    5. The right ventricle is normal in size, with normal systolic function.    CHA2DS2-VASc Score = 5  The patient's score is based upon: CHF History: 1 HTN History: 1 Diabetes History: 1 Stroke History: 0 Vascular Disease History: 1 Age Score: 1 Gender Score: 0       ASSESSMENT AND PLAN: Persistent Atrial Fibrillation (ICD10:  I48.19) The patient's CHA2DS2-VASc score is 5, indicating a 7.2% annual risk of stroke.   S/p afib ablation 03/2021 with Dr Leni.  He is in SR today with recent DCCVs in the ED. We discussed rhythm control options including AAD and ablation. He would like to transfer his care to EP at [facility] and have a consultation for PFA, will refer.  He has a Engineer, Agricultural, can get established with device clinic.  Continue Eliquis 5 mg BID Continue Toprol  25 mg BID  Secondary Hypercoagulable State (ICD10:  D68.69) The patient is at significant risk for stroke/thromboembolism based upon his CHA2DS2-VASc Score of 5.  Continue Apixaban (Eliquis). No bleeding issues.   CAD Non  obstructive per [facility] notes No anginal symptoms  HTN Stable on current regimen  VHD Mild to moderate AS   Follow up with EP to establish care and discuss PFA.     [facility] [address] [place], [place] [address] [phone]

## 2023-12-07 ENCOUNTER — Encounter: Payer: Self-pay | Admitting: Family Medicine

## 2023-12-07 ENCOUNTER — Ambulatory Visit (INDEPENDENT_AMBULATORY_CARE_PROVIDER_SITE_OTHER): Payer: Self-pay | Admitting: Family Medicine

## 2023-12-07 VITALS — BP 144/71 | HR 85 | Ht 68.0 in | Wt 190.0 lb

## 2023-12-07 DIAGNOSIS — R2681 Unsteadiness on feet: Secondary | ICD-10-CM | POA: Diagnosis not present

## 2023-12-07 DIAGNOSIS — Z0000 Encounter for general adult medical examination without abnormal findings: Secondary | ICD-10-CM

## 2023-12-07 DIAGNOSIS — Z0001 Encounter for general adult medical examination with abnormal findings: Secondary | ICD-10-CM | POA: Diagnosis not present

## 2023-12-07 DIAGNOSIS — R296 Repeated falls: Secondary | ICD-10-CM

## 2023-12-07 NOTE — Progress Notes (Signed)
 Chief Complaint  Patient presents with   Medicare Wellness     Subjective:   Johnny Mathews is a 63 y.o. male who presents for a Welcome to Medicare Exam.   The 10-year ASCVD risk score (Arnett DK, et al., 2019) is: 25.1%   Values used to calculate the score:     Age: 40 years     Clincally relevant sex: Male     Is Non-Hispanic African American: No     Diabetic: Yes     Tobacco smoker: No     Systolic Blood Pressure: 144 mmHg     Is BP treated: Yes     HDL Cholesterol: 69 mg/dL     Total Cholesterol: 179 mg/dL   Allergies (verified) Egg protein-containing drug products, Septra [sulfamethoxazole-trimethoprim], and Sulfonamide derivatives   History: Past Medical History:  Diagnosis Date   Allergy    Alpha galactosidase deficiency    Anemia    past hx    Arthritis    Asthma    Atrial fibrillation (HCC)    Blood transfusion without reported diagnosis    BPH (benign prostatic hypertrophy)    Cataract    removed left eye    Complication of anesthesia    Diabetes mellitus without complication (HCC)    Dysrhythmia    GERD (gastroesophageal reflux disease)    Heart murmur    History of kidney stones    HOH (hard of hearing)    Hyperlipidemia    Hypertension    Irregular heart beat    Muscular dystrophy (HCC)    PONV (postoperative nausea and vomiting)    Sleep apnea    Wears hearing aid in both ears    Past Surgical History:  Procedure Laterality Date   CARDIAC ELECTROPHYSIOLOGY STUDY AND ABLATION  11/2022   CATARACT EXTRACTION Left    COLONOSCOPY     ESOPHAGOGASTRODUODENOSCOPY N/A 08/27/2020   Procedure: ESOPHAGOGASTRODUODENOSCOPY (EGD);  Surgeon: Shyrl Linnie KIDD, MD;  Location: [facility] OR;  Service: Thoracic;  Laterality: N/A;   FEMUR FRACTURE SURGERY     INGUINAL HERNIA REPAIR Right 08/07/2021   Procedure: HERNIA REPAIR INGUINAL ADULT WITH MESH;  Surgeon: Kallie Manuelita BROCKS, MD;  Location: [facility] ORS;  Service: General;  Laterality: Right;   INSERTION OF MESH  N/A 08/27/2020   Procedure: INSERTION OF ACELL 7.5 x 6cm GENTRIX SURGICAL MATRIX HIATAL MESH;  Surgeon: Shyrl Linnie KIDD, MD;  Location: [facility] OR;  Service: Thoracic;  Laterality: N/A;   KIDNEY STONE SURGERY     x6   KNEE SURGERY Right    x2   MASS EXCISION Right 08/02/2018   Procedure: EXCISION RIGHT LONG FINGER MASS, DEBRIDEMENT PROXIMAL INTERPHALANGEAL JOINT WITH ROTATION FLAP;  Surgeon: Murrell Drivers, MD;  Location:  SURGERY CENTER;  Service: Orthopedics;  Laterality: Right;   NISSEN FUNDOPLICATION     [facility]    SHOULDER OPEN ROTATOR CUFF REPAIR     XI ROBOTIC ASSISTED HIATAL HERNIA REPAIR N/A 08/27/2020   Procedure: XI ROBOTIC ASSISTED REDO HIATAL HERNIA REPAIR;  Surgeon: Shyrl Linnie KIDD, MD;  Location: [facility] OR;  Service: Thoracic;  Laterality: N/A;   Family History  Problem Relation Age of Onset   Hypertension Mother    Breast cancer Mother    Heart disease Father    Breast cancer Sister    Colon cancer Neg Hx    Colon polyps Neg Hx    Esophageal cancer Neg Hx    Rectal cancer Neg Hx    Stomach  cancer Neg Hx    Social History   Occupational History   Occupation: designer, industrial/product  Tobacco Use   Smoking status: Former    Current packs/day: 0.00    Average packs/day: 1 pack/day for 10.0 years (10.0 ttl pk-yrs)    Types: Cigarettes    Start date: 01/07/1984    Quit date: 01/06/1994    Years since quitting: 29.9    Passive exposure: Past   Smokeless tobacco: Current    Types: Snuff  Vaping Use   Vaping status: Never Used  Substance and Sexual Activity   Alcohol use: Yes    Alcohol/week: 2.0 - 3.0 standard drinks of alcohol    Types: 2 - 3 Standard drinks or equivalent per week    Comment: social   Drug use: No   Sexual activity: Not on file   Tobacco Counseling Ready to quit: Not Answered Counseling given: Not Answered  SDOH Screenings   Food Insecurity: Unknown (12/07/2023)  Housing: Unknown (12/07/2023)  Transportation Needs: No Transportation  Needs (12/07/2023)  Utilities: Not At Risk (12/07/2023)  Depression (PHQ2-9): Low Risk  (12/07/2023)  Financial Resource Strain: Medium Risk (06/21/2023)   [facility] from [facility]  Physical Activity: Sufficiently Active (12/07/2023)  Social Connections: Socially Integrated (12/07/2023)  Stress: Stress Concern Present (12/07/2023)  Tobacco Use: High Risk (12/07/2023)  Health Literacy: Adequate Health Literacy (12/07/2023)   See flowsheets for full screening details  Depression Screen PHQ 2 & 9 Depression Scale- Over the past 2 weeks, how often have you been bothered by any of the following problems? Little interest or pleasure in doing things: 0 Feeling down, depressed, or hopeless (PHQ Adolescent also includes...irritable): 0 PHQ-2 Total Score: 0 Trouble falling or staying asleep, or sleeping too much: 0 Feeling tired or having little energy: 0 Poor appetite or overeating (PHQ Adolescent also includes...weight loss): 0 Feeling bad about yourself - or that you are a failure or have let yourself or your family down: 0 Trouble concentrating on things, such as reading the newspaper or watching television (PHQ Adolescent also includes...like school work): 0 Moving or speaking so slowly that other people could have noticed. Or the opposite - being so fidgety or restless that you have been moving around a lot more than usual: 0 Thoughts that you would be better off dead, or of hurting yourself in some way: 0 PHQ-9 Total Score: 0 If you checked off any problems, how difficult have these problems made it for you to do your work, take care of things at home, or get along with other people?: Not difficult at all      Goals Addressed   None    Visit info / Clinical Intake: Interpreter Needed?: No  Fall Screening Falls in the past year?: 0 Number of falls in past year: 0 Was there an injury with Fall?: 1 Fall Risk Category Calculator: 2 Patient Fall Risk Level: Low fall risk Discussed doing  therapy   Fall Risk Patient at Risk for Falls Due to: Impaired balance/gait Fall risk Follow up: Falls evaluation completed  Cognitive Assessment What year is it?: 0 points What month is it?: 0 points Give patient an address phrase to remember (5 components): [address] About what time is it?: 0 points Count backwards from 20 to 1: 2 points Say the months of the year in reverse: 4 points Repeat the address phrase from earlier: 2 points 6 CIT Score: 8 points  Advance Directives (For Healthcare) Does Patient Have a  Medical Advance Directive?: No Would patient like information on creating a medical advance directive?: No - Patient declined         Objective:    Today's Vitals   12/07/23 0953 12/07/23 0954  BP: (!) 146/71 (!) 144/71  Pulse: 85   SpO2: 97%   Weight: 190 lb (86.2 kg)   Height: 5' 8 (1.727 m)    Body mass index is 28.89 kg/m.   Physical Exam Vitals and nursing note reviewed.  Constitutional:      General: He is not in acute distress.    Appearance: He is well-developed. He is not diaphoretic.  Eyes:     General: No scleral icterus.    Conjunctiva/sclera: Conjunctivae normal.  Neck:     Thyroid : No thyromegaly.  Skin:    General: Skin is warm and dry.     Findings: No rash.  Neurological:     Mental Status: He is alert.  Psychiatric:        Behavior: Behavior normal.      Current Medications (verified) Outpatient Encounter Medications as of 12/07/2023  Medication Sig   albuterol  (VENTOLIN  HFA) 108 (90 Base) MCG/ACT inhaler INHALE 2 PUFFS EVERY 4 HOURS AS NEEDED   amitriptyline  (ELAVIL ) 100 MG tablet TAKE 2 TABLETS BY MOUTH AT BEDTIME   blood glucose meter kit and supplies Dispense based on patient and insurance preference. Use up to four times daily as directed. (FOR ICD-10 E10.9, E11.9). Pt states needs One Touch Verio Meter and one touch delica plus test strips   Blood Glucose Monitoring Suppl (CONTOUR BLOOD GLUCOSE SYSTEM)  w/Device KIT Test blood sugars four times daily   budesonide -formoterol  (SYMBICORT ) 80-4.5 MCG/ACT inhaler Take 2 puffs first thing in am and then another 2 puffs about 12 hours later.   cetirizine (ZYRTEC) 10 MG tablet Take 10 mg by mouth every evening.   ELIQUIS 5 MG TABS tablet Take 5 mg by mouth 2 (two) times daily.   EPINEPHRINE  0.3 mg/0.3 mL IJ SOAJ injection INJECT 0.3ML (0.3MG ) IM ONCE   esomeprazole  (NEXIUM ) 40 MG capsule Take 1 capsule (40 mg total) by mouth daily at 12 noon. Take 30-60 min before first meal of the day   famotidine  (PEPCID ) 20 MG tablet TAKE ONE (1) TABLET BY MOUTH TWO (2) TIMES DAILY   finasteride  (PROSCAR ) 5 MG tablet TAKE ONE (1) TABLET EACH DAY   fluticasone  (FLONASE ) 50 MCG/ACT nasal spray USE 1 TO 2 SPRAYS IN EACH NOSTRIL DAILY   furosemide  (LASIX ) 20 MG tablet Take 1 tablet (20 mg total) by mouth daily as needed.   glucose blood (CONTOUR NEXT TEST) test strip Test blood sugars four times daily   ketoconazole (NIZORAL) 2 % cream SMARTSIG:1 Application Topical 1 to 2 Times Daily   Lancets (ONETOUCH DELICA PLUS LANCET33G) MISC    losartan  (COZAAR ) 50 MG tablet Take 1 tablet (50 mg total) by mouth daily.   metoprolol  succinate (TOPROL -XL) 25 MG 24 hr tablet Take 25 mg by mouth 2 (two) times daily.   montelukast  (SINGULAIR ) 10 MG tablet TAKE ONE TABLET DAILY AT BEDTIME   mupirocin ointment (BACTROBAN) 2 % Apply 1 Application topically 2 (two) times daily.   pravastatin  (PRAVACHOL ) 80 MG tablet Take 1 tablet (80 mg total) by mouth daily.   Semaglutide , 1 MG/DOSE, (OZEMPIC , 1 MG/DOSE,) 4 MG/3ML SOPN INJECT 1MG  AS DIRECTED ONCE A WEEK   Spacer/Aero-Holding Chambers DEVI Use with inhaler daily   spironolactone (ALDACTONE) 25 MG tablet Take 12.5 mg by mouth  daily.   SYRINGE-NEEDLE, DISP, 3 ML 21G X 1-1/2 3 ML MISC Use to inject testosterone  every week   tadalafil  (CIALIS ) 10 MG tablet TAKE 1 TABLET BY MOUTH EVERY OTHER DAY AS NEEDED   tamsulosin  (FLOMAX ) 0.4 MG CAPS  capsule TAKE 1 CAPSULE DAILY AFTER SUPPER (Patient taking differently: Take 0.4 mg by mouth every morning.)   testosterone  cypionate (DEPOTESTOSTERONE CYPIONATE) 200 MG/ML injection INJECT 100MG  (0.5ML) EVERY 10 DAYS (FOR INTRAMUSCULAR USE ONLY)   [DISCONTINUED] ferrous sulfate  325 (65 FE) MG tablet Take 325 mg by mouth 2 (two) times a week. (Patient not taking: Reported on 11/30/2023)   No facility-administered encounter medications on file as of 12/07/2023.   Hearing/Vision screen No results found. Immunizations and Health Maintenance Health Maintenance  Topic Date Due   Diabetic kidney evaluation - Urine ACR  11/23/2015   OPHTHALMOLOGY EXAM  02/13/2023   COVID-19 Vaccine (4 - 2025-26 season) 09/07/2023   HIV Screening  07/26/2024 (Originally 04/14/1975)   HEMOGLOBIN A1C  01/09/2024   FOOT EXAM  07/26/2024   Diabetic kidney evaluation - eGFR measurement  11/21/2024   Medicare Annual Wellness (AWV)  12/06/2024   Colonoscopy  08/02/2028   DTaP/Tdap/Td (3 - Td or Tdap) 11/08/2029   Pneumococcal Vaccine: 50+ Years  Completed   Influenza Vaccine  Completed   Hepatitis C Screening  Completed   Zoster Vaccines- Shingrix  Completed   Hepatitis B Vaccines 19-59 Average Risk  Aged Out   Meningococcal B Vaccine  Aged Out    EKG:      Assessment/Plan:  This is a routine wellness examination for Jamaris.  Patient Care Team: Taisia Fantini, Fonda LABOR, MD as PCP - General (Family Medicine) Tobie Tonita POUR, DO as Consulting Physician (Neurology) Darlean Ozell NOVAK, MD as Consulting Physician (Pulmonary Disease)  I have personally reviewed and noted the following in the patient's chart:   Medical and social history Use of alcohol, tobacco or illicit drugs  Current medications and supplements including opioid prescriptions. Functional ability and status Nutritional status Physical activity Advanced directives List of other physicians Hospitalizations, surgeries, and ER visits in previous 12  months Vitals Screenings to include cognitive, depression, and falls Referrals and appointments  Orders Placed This Encounter  Procedures   Ambulatory referral to Physical Therapy    Referral Priority:   Routine    Referral Type:   Physical Medicine    Referral Reason:   Specialty Services Required    Requested Specialty:   Physical Therapy    Number of Visits Requested:   1   In addition, I have reviewed and discussed with patient certain preventive protocols, quality metrics, and best practice recommendations. A written personalized care plan for preventive services as well as general preventive health recommendations were provided to patient.   Fonda LABOR Braylon Grenda, MD   12/07/2023   Return if symptoms worsen or fail to improve, for 1-2 month phsyical.

## 2023-12-23 ENCOUNTER — Other Ambulatory Visit: Payer: Self-pay

## 2023-12-23 ENCOUNTER — Ambulatory Visit: Admitting: Physical Therapy

## 2023-12-23 ENCOUNTER — Ambulatory Visit: Payer: Self-pay | Attending: Family Medicine | Admitting: Physical Therapy

## 2023-12-23 DIAGNOSIS — R2681 Unsteadiness on feet: Secondary | ICD-10-CM | POA: Diagnosis present

## 2023-12-23 DIAGNOSIS — R296 Repeated falls: Secondary | ICD-10-CM | POA: Diagnosis not present

## 2023-12-23 NOTE — Therapy (Signed)
 OUTPATIENT PHYSICAL THERAPY NEURO EVALUATION   Patient Name: Johnny Mathews MRN: [id number] DOB:[birth year], 63 y.o., male Today's Date: 12/23/2023  REFERRING PROVIDER: Fonda Dettinger MD  END OF SESSION:  PT End of Session - 12/23/23 1040     Visit Number 1    Number of Visits 12    Date for Recertification  02/03/24    PT Start Time 1015    PT Stop Time 1041    PT Time Calculation (min) 26 min    Activity Tolerance Patient tolerated treatment well    Behavior During Therapy WFL for tasks assessed/performed          Past Medical History:  Diagnosis Date   Allergy    Alpha galactosidase deficiency    Anemia    past hx    Arthritis    Asthma    Atrial fibrillation (HCC)    Blood transfusion without reported diagnosis    BPH (benign prostatic hypertrophy)    Cataract    removed left eye    Complication of anesthesia    Diabetes mellitus without complication (HCC)    Dysrhythmia    GERD (gastroesophageal reflux disease)    Heart murmur    History of kidney stones    HOH (hard of hearing)    Hyperlipidemia    Hypertension    Irregular heart beat    Muscular dystrophy (HCC)    PONV (postoperative nausea and vomiting)    Sleep apnea    Wears hearing aid in both ears    Past Surgical History:  Procedure Laterality Date   CARDIAC ELECTROPHYSIOLOGY STUDY AND ABLATION  11/2022   CATARACT EXTRACTION Left    COLONOSCOPY     ESOPHAGOGASTRODUODENOSCOPY N/A 08/27/2020   Procedure: ESOPHAGOGASTRODUODENOSCOPY (EGD);  Surgeon: Shyrl Linnie KIDD, MD;  Location: [facility] OR;  Service: Thoracic;  Laterality: N/A;   FEMUR FRACTURE SURGERY     INGUINAL HERNIA REPAIR Right 08/07/2021   Procedure: HERNIA REPAIR INGUINAL ADULT WITH MESH;  Surgeon: Kallie Manuelita BROCKS, MD;  Location: [facility];  Service: General;  Laterality: Right;   INSERTION OF MESH N/A 08/27/2020   Procedure: INSERTION OF ACELL 7.5 x 6cm GENTRIX SURGICAL MATRIX HIATAL MESH;  Surgeon: Shyrl Linnie KIDD, MD;   Location: [facility] OR;  Service: Thoracic;  Laterality: N/A;   KIDNEY STONE SURGERY     x6   KNEE SURGERY Right    x2   MASS EXCISION Right 08/02/2018   Procedure: EXCISION RIGHT LONG FINGER MASS, DEBRIDEMENT PROXIMAL INTERPHALANGEAL JOINT WITH ROTATION FLAP;  Surgeon: Murrell Drivers, MD;  Location: [facility];  Service: Orthopedics;  Laterality: Right;   NISSEN FUNDOPLICATION     [facility]    SHOULDER OPEN ROTATOR CUFF REPAIR     XI ROBOTIC ASSISTED HIATAL HERNIA REPAIR N/A 08/27/2020   Procedure: XI ROBOTIC ASSISTED REDO HIATAL HERNIA REPAIR;  Surgeon: Shyrl Linnie KIDD, MD;  Location: [facility] OR;  Service: Thoracic;  Laterality: N/A;   Patient Active Problem List   Diagnosis Date Noted   AC joint arthropathy 08/05/2023   Impingement of left shoulder 08/05/2023   Laceration of left thumb without foreign body without damage to nail 04/21/2023   Pulmonary infiltrates on CXR 04/09/2023   Myopathy 03/20/2023   Asthma 03/20/2023   Recurrent right inguinal hernia    PAF (paroxysmal atrial fibrillation) (HCC) 01/17/2021   Status post placement of implantable loop recorder 12/10/2020   Hiatal hernia 08/27/2020   Anemia 04/05/2020   BPH (benign prostatic hyperplasia) 11/09/2019  Aortic valve stenosis 07/19/2019   Coronary artery disease of native artery of native heart with stable angina pectoris 04/18/2019   Anginal equivalent 01/18/2019   Chronic heart failure with preserved ejection fraction (HCC) 01/18/2019   DOE (dyspnea on exertion) 01/18/2019   Osteoarthritis of finger of right hand 07/21/2018   Hypogonadism, male 03/23/2018   Allergy to meat 06/21/2015   Hypertension associated with diabetes (HCC) 02/05/2015   Sciatica of left side 10/02/2014   Mitochondrial myopathy 07/25/2014   GERD (gastroesophageal reflux disease) 12/23/2012   Hyperlipidemia associated with type 2 diabetes mellitus (HCC) 12/23/2012   Diabetes mellitus, stable (HCC) 12/23/2012   Asthma, mild  intermittent 12/23/2012   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    REFERRING DIAG: Recurrent falls.  Gait instability.    THERAPY DIAG:  Unsteadiness on feet  Rationale for Evaluation and Treatment: Rehabilitation  SUBJECTIVE:                                                                                                                                                                                             SUBJECTIVE STATEMENT: The patient presents to the clinic with c/o recurrent falls.  He states he tends to trip over his feet.  Recommend he use a cane or walking stick.  He states he has a stick at home that he uses for hiking.    PERTINENT HISTORY: MD.  H/o shoulder pain.  Right femoral fracture, knee surgery.     PRECAUTIONS: Fall.  Recommend using walking stick at all times.    WEIGHT BEARING RESTRICTIONS: No  FALLS: Has patient fallen in last 6 months? Yes. Number of falls not sure.  1 recently.    LIVING ENVIRONMENT: Lives with: lives with their spouse Lives in: House/apartment Has following equipment at home: None  PLOF: Independent  PATIENT GOALS: Not fall.    OBJECTIVE:   LOWER EXTREMITY ROM:     WNL.  LOWER EXTREMITY MMT:    Right hip flexion is 4 to 4+/5, knee and ankle strength is normal though a bit shaky via manual muscle testing.    GAIT: Wide base of support with left LE ER.  FUNCTIONAL TESTING:   5 time sit to stand:  21 seconds  TUG:  18 seconds  Romberg:  No loss of balance but wobbly and supervision/CGA required.  TREATMENT DATE:     PATIENT EDUCATION: Education details: Discussed using walking stick for safety.   Person educated: Patient Education method: Explanation Education comprehension: verbalized understanding  HOME EXERCISE PROGRAM:   GOALS: LONG TERM GOALS:  Target date: 02/03/24  Ind with a HEP.  Goal status: INITIAL  2.  Improve 5 sit to stand by 4-5 seconds.  Goal status: INITIAL  3.  Improve TUG by 3-4 seconds.  Goal status: INITIAL  4.  Walk safety with walking stick or cane 750 feet on even and uneven terrain (lawn/grass).  Goal status: INITIAL  5.  Perform a reciprocating stair gait with one railing.  Goal status: INITIAL  ASSESSMENT:  CLINICAL IMPRESSION: The patient presents to [facility] c/o falls.  We discussed him using a cane or walking stick at all times. His five time sit to stand test was performed in 21 seconds and TUG is 18 seconds.  He did not lose his balance when performing the Romberg test but supervision/CGA was required.  He is motivated to improve.    OBJECTIVE IMPAIRMENTS: Abnormal gait, decreased balance, and decreased strength.   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   REHAB POTENTIAL: Good  CLINICAL DECISION MAKING: Evolving/moderate complexity  EVALUATION COMPLEXITY: Moderate  PLAN:  PT FREQUENCY: 2x/week  PT DURATION: 6 weeks  PLANNED INTERVENTIONS: 97110-Therapeutic exercises, 97530- Therapeutic activity, [id number]- Neuromuscular re-education, 97535- Self Care, [id number]- Manual therapy, and Patient/Family education  PLAN FOR NEXT SESSION: Gait and balance training.     Sahar Ryback, PT 12/23/2023, 11:10 AM

## 2023-12-24 ENCOUNTER — Encounter: Payer: Self-pay | Admitting: Family Medicine

## 2023-12-25 ENCOUNTER — Ambulatory Visit: Admitting: Family Medicine

## 2023-12-25 ENCOUNTER — Ambulatory Visit: Admitting: Physical Therapy

## 2023-12-25 VITALS — BP 132/82 | HR 94 | Temp 98.1°F | Ht 68.0 in | Wt 186.8 lb

## 2023-12-25 DIAGNOSIS — R2681 Unsteadiness on feet: Secondary | ICD-10-CM | POA: Diagnosis not present

## 2023-12-25 DIAGNOSIS — R0683 Snoring: Secondary | ICD-10-CM

## 2023-12-25 DIAGNOSIS — R0609 Other forms of dyspnea: Secondary | ICD-10-CM

## 2023-12-25 DIAGNOSIS — J45909 Unspecified asthma, uncomplicated: Secondary | ICD-10-CM

## 2023-12-25 NOTE — Progress Notes (Signed)
 "  Acute Office Visit  Patient ID: Johnny Mathews, male    DOB: 05/09/1960, 63 y.o.   MRN: [id number]  PCP: Dettinger, Fonda LABOR, MD  Chief Complaint  Patient presents with   Asthma    Patient reports having an asthma attack Wednesday night, but now thinking about it, he says it could have been food that went down the wrong way and not an asthma attack. Since then, patient says he can't breath as well.    Subjective:     HPI  Discussed the use of AI scribe software for clinical note transcription with the patient, who gave verbal consent to proceed.  History of Present Illness   Johnny Mathews is a 63 year old male with asthma who presents with difficulty breathing last night.   Dyspnea - Patient reports that he woke up last night and felt like he could not breath.  - Reports feeling back to baseline today. States that his baseline symptoms include feeling like he can't breath deep and DOE. Symptoms last night were worse than normal.  - Denies any sick symptoms.   Asthma management - Asthma since childhood - Uses Symbicort  inhaler regularly - Has UTD albuterol  inhaler.  - Was seen by Dr. Darlean pulm earlier this year but reports that he does not want to go back to him and would  Sleep-related breathing abnormalities - History of snoring - Family members have observed episodes of apnea during sleep - Last sleep study was 20 years ago per patient.   Drooling - Drooling on pillow for the past year  Gastroesophageal reflux symptoms - Takes Nexium  and Pepcid  for acid reflux       ROS     Objective:    BP 132/82   Pulse 94   Temp 98.1 F (36.7 C)   Ht 5' 8 (1.727 m)   Wt 186 lb 12.8 oz (84.7 kg)   SpO2 96%   BMI 28.40 kg/m    Physical Exam Vitals reviewed.  Constitutional:      Appearance: Normal appearance.  HENT:     Head: Normocephalic and atraumatic.  Eyes:     Extraocular Movements: Extraocular movements intact.     Conjunctiva/sclera: Conjunctivae  normal.     Pupils: Pupils are equal, round, and reactive to light.  Cardiovascular:     Rate and Rhythm: Normal rate and regular rhythm.     Pulses: Normal pulses.     Heart sounds: Normal heart sounds. No murmur heard. Pulmonary:     Effort: Pulmonary effort is normal. No respiratory distress.     Breath sounds: Normal breath sounds.  Musculoskeletal:        General: No deformity. Normal range of motion.     Cervical back: Normal range of motion.  Skin:    General: Skin is warm and dry.  Neurological:     General: No focal deficit present.     Mental Status: He is alert and oriented to person, place, and time.  Psychiatric:        Mood and Affect: Mood normal.        Behavior: Behavior normal.       No results found for any visits on 12/25/23.     Assessment & Plan:   Problem List Items Addressed This Visit       Respiratory   Asthma - Primary   Relevant Orders   Ambulatory referral to Pulmonology     Other   DOE (dyspnea  on exertion)   Other Visit Diagnoses       Snoring       Relevant Orders   Ambulatory referral to Sleep Studies       Assessment and Plan  Patient reports that he woke up last night with sensation of not being able to breath. Back to baseline this am. No sick symptoms. Physical exam unremarkable.     Possible sleep apnea?  - Referred to sleep specialist for sleep study.  Asthma Chronic asthma. Previous pulmonology consultation unsatisfactory to patient.  - Continue Symbicort  inhaler. - Referred to new pulmonologist for further evaluation.        No orders of the defined types were placed in this encounter.   Return if symptoms worsen or fail to improve.  Oneil LELON Severin, FNP [facility]   "

## 2023-12-25 NOTE — Therapy (Signed)
 " OUTPATIENT PHYSICAL THERAPY NEURO EVALUATION   Patient Name: Johnny Mathews MRN: [id number] DOB:1960/02/17, 63 y.o., male Today's Date: 12/25/2023  REFERRING PROVIDER: Fonda Dettinger MD  END OF SESSION:  PT End of Session - 12/25/23 1213     Visit Number 2    Number of Visits 12    Date for Recertification  02/03/24    PT Start Time 1145    PT Stop Time 1234    PT Time Calculation (min) 49 min    Activity Tolerance Patient tolerated treatment well    Behavior During Therapy WFL for tasks assessed/performed          Past Medical History:  Diagnosis Date   Allergy    Alpha galactosidase deficiency    Anemia    past hx    Arthritis    Asthma    Atrial fibrillation (HCC)    Blood transfusion without reported diagnosis    BPH (benign prostatic hypertrophy)    Cataract    removed left eye    Complication of anesthesia    Diabetes mellitus without complication (HCC)    Dysrhythmia    GERD (gastroesophageal reflux disease)    Heart murmur    History of kidney stones    HOH (hard of hearing)    Hyperlipidemia    Hypertension    Irregular heart beat    Muscular dystrophy (HCC)    PONV (postoperative nausea and vomiting)    Sleep apnea    Wears hearing aid in both ears    Past Surgical History:  Procedure Laterality Date   CARDIAC ELECTROPHYSIOLOGY STUDY AND ABLATION  11/2022   CATARACT EXTRACTION Left    COLONOSCOPY     ESOPHAGOGASTRODUODENOSCOPY N/A 08/27/2020   Procedure: ESOPHAGOGASTRODUODENOSCOPY (EGD);  Surgeon: Shyrl Linnie KIDD, MD;  Location: [facility] OR;  Service: Thoracic;  Laterality: N/A;   FEMUR FRACTURE SURGERY     INGUINAL HERNIA REPAIR Right 08/07/2021   Procedure: HERNIA REPAIR INGUINAL ADULT WITH MESH;  Surgeon: Kallie Manuelita BROCKS, MD;  Location: [facility];  Service: General;  Laterality: Right;   INSERTION OF MESH N/A 08/27/2020   Procedure: INSERTION OF ACELL 7.5 x 6cm GENTRIX SURGICAL MATRIX HIATAL MESH;  Surgeon: Shyrl Linnie KIDD, MD;   Location: [facility];  Service: Thoracic;  Laterality: N/A;   KIDNEY STONE SURGERY     x6   KNEE SURGERY Right    x2   MASS EXCISION Right 08/02/2018   Procedure: EXCISION RIGHT LONG FINGER MASS, DEBRIDEMENT PROXIMAL INTERPHALANGEAL JOINT WITH ROTATION FLAP;  Surgeon: Murrell Drivers, MD;  Location: [facility];  Service: Orthopedics;  Laterality: Right;   NISSEN FUNDOPLICATION     [facility]    SHOULDER OPEN ROTATOR CUFF REPAIR     XI ROBOTIC ASSISTED HIATAL HERNIA REPAIR N/A 08/27/2020   Procedure: XI ROBOTIC ASSISTED REDO HIATAL HERNIA REPAIR;  Surgeon: Shyrl Linnie KIDD, MD;  Location: [facility] OR;  Service: Thoracic;  Laterality: N/A;   Patient Active Problem List   Diagnosis Date Noted   AC joint arthropathy 08/05/2023   Impingement of left shoulder 08/05/2023   Laceration of left thumb without foreign body without damage to nail 04/21/2023   Pulmonary infiltrates on CXR 04/09/2023   Myopathy 03/20/2023   Asthma 03/20/2023   Recurrent right inguinal hernia    PAF (paroxysmal atrial fibrillation) (HCC) 01/17/2021   Status post placement of implantable loop recorder 12/10/2020   Hiatal hernia 08/27/2020   Anemia 04/05/2020   BPH (benign prostatic hyperplasia)  11/09/2019   Aortic valve stenosis 07/19/2019   Coronary artery disease of native artery of native heart with stable angina pectoris 04/18/2019   Anginal equivalent 01/18/2019   Chronic heart failure with preserved ejection fraction (HCC) 01/18/2019   DOE (dyspnea on exertion) 01/18/2019   Osteoarthritis of finger of right hand 07/21/2018   Hypogonadism, male 03/23/2018   Allergy to meat 06/21/2015   Hypertension associated with diabetes (HCC) 02/05/2015   Sciatica of left side 10/02/2014   Mitochondrial myopathy 07/25/2014   GERD (gastroesophageal reflux disease) 12/23/2012   Hyperlipidemia associated with type 2 diabetes mellitus (HCC) 12/23/2012   Diabetes mellitus, stable (HCC) 12/23/2012   Asthma, mild  intermittent 12/23/2012   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    REFERRING DIAG: Recurrent falls.  Gait instability.    THERAPY DIAG:  Unsteadiness on feet  Rationale for Evaluation and Treatment: Rehabilitation  SUBJECTIVE:                                                                                                                                                                                             SUBJECTIVE STATEMENT: No new complaints.  PERTINENT HISTORY: MD.  H/o shoulder pain.  Right femoral fracture, knee surgery.     PRECAUTIONS: Fall.  Recommend using walking stick at all times.    WEIGHT BEARING RESTRICTIONS: No  FALLS: Has patient fallen in last 6 months? Yes. Number of falls not sure.  1 recently.    LIVING ENVIRONMENT: Lives with: lives with their spouse Lives in: House/apartment Has following equipment at home: None  PLOF: Independent  PATIENT GOALS: Not fall.    OBJECTIVE:   LOWER EXTREMITY ROM:     WNL.  LOWER EXTREMITY MMT:    Right hip flexion is 4 to 4+/5, knee and ankle strength is normal though a bit shaky via manual muscle testing.    GAIT: Wide base of support with left LE ER.  FUNCTIONAL TESTING:   5 time sit to stand:  21 seconds  TUG:  18 seconds  Romberg:  No loss of balance but wobbly and supervision/CGA required.  TREATMENT DATE:   12/25/23:  Nustep level 4 x 15 minutes f/b Rockerboard x 5 minutes, inverted BOSU x 5 minutes and side stepping on balance pad foam x 5 minutes in parallel bars f/b knee ext 10# x 3 minutes f/b hams curls at 40# x 3 minutes f/b leg press 2 plates x 3 minutes   PATIENT EDUCATION: Education details: Discussed using walking stick for safety.   Person educated: Patient Education method: Explanation Education comprehension: verbalized  understanding  HOME EXERCISE PROGRAM:   GOALS: LONG TERM GOALS: Target date: 02/03/24  Ind with a HEP.  Goal status: INITIAL  2.  Improve 5 sit to stand by 4-5 seconds.  Goal status: INITIAL  3.  Improve TUG by 3-4 seconds.  Goal status: INITIAL  4.  Walk safety with walking stick or cane 750 feet on even and uneven terrain (lawn/grass).  Goal status: INITIAL  5.  Perform a reciprocating stair gait with one railing.  Goal status: INITIAL  ASSESSMENT:  CLINICAL IMPRESSION: The patient is highly motivated and did well with balance and strengthening interventions today.     OBJECTIVE IMPAIRMENTS: Abnormal gait, decreased balance, and decreased strength.   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   REHAB POTENTIAL: Good  CLINICAL DECISION MAKING: Evolving/moderate complexity  EVALUATION COMPLEXITY: Moderate  PLAN:  PT FREQUENCY: 2x/week  PT DURATION: 6 weeks  PLANNED INTERVENTIONS: 97110-Therapeutic exercises, 97530- Therapeutic activity, [id number]- Neuromuscular re-education, 97535- Self Care, [id number]- Manual therapy, and Patient/Family education  PLAN FOR NEXT SESSION: Gait and balance training.     Gee Habig, PT 12/25/2023, 12:44 PM        "

## 2023-12-29 ENCOUNTER — Encounter: Payer: Self-pay | Admitting: *Deleted

## 2023-12-29 ENCOUNTER — Ambulatory Visit: Admitting: *Deleted

## 2023-12-29 DIAGNOSIS — R2681 Unsteadiness on feet: Secondary | ICD-10-CM

## 2023-12-29 NOTE — Therapy (Signed)
 " OUTPATIENT PHYSICAL THERAPY NEURO EVALUATION   Patient Name: Johnny Mathews MRN: [id number] DOB:11/30/1960, 63 y.o., male Today's Date: 12/29/2023  REFERRING PROVIDER: Fonda Dettinger MD  END OF SESSION:  PT End of Session - 12/29/23 0932     Visit Number 3    Number of Visits 12    Date for Recertification  02/03/24    PT Start Time 0932          Past Medical History:  Diagnosis Date   Allergy    Alpha galactosidase deficiency    Anemia    past hx    Arthritis    Asthma    Atrial fibrillation (HCC)    Blood transfusion without reported diagnosis    BPH (benign prostatic hypertrophy)    Cataract    removed left eye    Complication of anesthesia    Diabetes mellitus without complication (HCC)    Dysrhythmia    GERD (gastroesophageal reflux disease)    Heart murmur    History of kidney stones    HOH (hard of hearing)    Hyperlipidemia    Hypertension    Irregular heart beat    Muscular dystrophy (HCC)    PONV (postoperative nausea and vomiting)    Sleep apnea    Wears hearing aid in both ears    Past Surgical History:  Procedure Laterality Date   CARDIAC ELECTROPHYSIOLOGY STUDY AND ABLATION  11/2022   CATARACT EXTRACTION Left    COLONOSCOPY     ESOPHAGOGASTRODUODENOSCOPY N/A 08/27/2020   Procedure: ESOPHAGOGASTRODUODENOSCOPY (EGD);  Surgeon: Shyrl Linnie KIDD, MD;  Location: [facility] OR;  Service: Thoracic;  Laterality: N/A;   FEMUR FRACTURE SURGERY     INGUINAL HERNIA REPAIR Right 08/07/2021   Procedure: HERNIA REPAIR INGUINAL ADULT WITH MESH;  Surgeon: Kallie Manuelita BROCKS, MD;  Location: [facility];  Service: General;  Laterality: Right;   INSERTION OF MESH N/A 08/27/2020   Procedure: INSERTION OF ACELL 7.5 x 6cm GENTRIX SURGICAL MATRIX HIATAL MESH;  Surgeon: Shyrl Linnie KIDD, MD;  Location: [facility] OR;  Service: Thoracic;  Laterality: N/A;   KIDNEY STONE SURGERY     x6   KNEE SURGERY Right    x2   MASS EXCISION Right 08/02/2018   Procedure: EXCISION RIGHT  LONG FINGER MASS, DEBRIDEMENT PROXIMAL INTERPHALANGEAL JOINT WITH ROTATION FLAP;  Surgeon: Murrell Drivers, MD;  Location: [facility];  Service: Orthopedics;  Laterality: Right;   NISSEN FUNDOPLICATION     [facility]    SHOULDER OPEN ROTATOR CUFF REPAIR     XI ROBOTIC ASSISTED HIATAL HERNIA REPAIR N/A 08/27/2020   Procedure: XI ROBOTIC ASSISTED REDO HIATAL HERNIA REPAIR;  Surgeon: Shyrl Linnie KIDD, MD;  Location: [facility] OR;  Service: Thoracic;  Laterality: N/A;   Patient Active Problem List   Diagnosis Date Noted   AC joint arthropathy 08/05/2023   Impingement of left shoulder 08/05/2023   Laceration of left thumb without foreign body without damage to nail 04/21/2023   Pulmonary infiltrates on CXR 04/09/2023   Myopathy 03/20/2023   Asthma 03/20/2023   Recurrent right inguinal hernia    PAF (paroxysmal atrial fibrillation) (HCC) 01/17/2021   Status post placement of implantable loop recorder 12/10/2020   Hiatal hernia 08/27/2020   Anemia 04/05/2020   BPH (benign prostatic hyperplasia) 11/09/2019   Aortic valve stenosis 07/19/2019   Coronary artery disease of native artery of native heart with stable angina pectoris 04/18/2019   Anginal equivalent 01/18/2019   Chronic heart failure with preserved  ejection fraction (HCC) 01/18/2019   DOE (dyspnea on exertion) 01/18/2019   Osteoarthritis of finger of right hand 07/21/2018   Hypogonadism, male 03/23/2018   Allergy to meat 06/21/2015   Hypertension associated with diabetes (HCC) 02/05/2015   Sciatica of left side 10/02/2014   Mitochondrial myopathy 07/25/2014   GERD (gastroesophageal reflux disease) 12/23/2012   Hyperlipidemia associated with type 2 diabetes mellitus (HCC) 12/23/2012   Diabetes mellitus, stable (HCC) 12/23/2012   Asthma, mild intermittent 12/23/2012   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    REFERRING DIAG: Recurrent falls.  Gait instability.    THERAPY DIAG:  Unsteadiness on  feet  Rationale for Evaluation and Treatment: Rehabilitation  SUBJECTIVE:                                                                                                                                                                                             SUBJECTIVE STATEMENT: Both calves were very sore  PERTINENT HISTORY: MD.  H/o shoulder pain.  Right femoral fracture, knee surgery.     PRECAUTIONS: Fall.  Recommend using walking stick at all times.    WEIGHT BEARING RESTRICTIONS: No  FALLS: Has patient fallen in last 6 months? Yes. Number of falls not sure.  1 recently.    LIVING ENVIRONMENT: Lives with: lives with their spouse Lives in: House/apartment Has following equipment at home: None  PLOF: Independent  PATIENT GOALS: Not fall.    OBJECTIVE:   LOWER EXTREMITY ROM:     WNL.  LOWER EXTREMITY MMT:    Right hip flexion is 4 to 4+/5, knee and ankle strength is normal though a bit shaky via manual muscle testing.    GAIT: Wide base of support with left LE ER.  FUNCTIONAL TESTING:   5 time sit to stand:  21 seconds  TUG:  18 seconds  Romberg:  No loss of balance but wobbly and supervision/CGA required.  TREATMENT DATE:  balance 12/29/23: Nustep level 4 x 15 minutes  Rocker board  x 5 mins inverted BOSU   side stepping on balance pad foam x 4  minutes, Heel-toe x 4 mins in parallel bars   knee ext 20 # x 3.5 minutes   hams curls at 40# x 3.65minutes   leg press 2 plates x 4 minutes   PATIENT EDUCATION: Education details: Discussed using walking stick for safety.   Person educated: Patient Education method: Explanation Education comprehension: verbalized understanding  HOME EXERCISE PROGRAM:   GOALS: LONG TERM GOALS: Target date: 02/03/24  Ind with a HEP.  Goal status: INITIAL  2.  Improve 5 sit to stand by 4-5  seconds.  Goal status: INITIAL  3.  Improve TUG by 3-4 seconds.  Goal status: INITIAL  4.  Walk safety with walking stick or cane 750 feet on even and uneven terrain (lawn/grass).  Goal status: INITIAL  5.  Perform a reciprocating stair gait with one railing.  Goal status: INITIAL  ASSESSMENT:  CLINICAL IMPRESSION: The patient arrived today doing fairly well with sore calves after last Rx, but good. He was able to continue with LE strengthening and balance act.'s with progressions and did well.    OBJECTIVE IMPAIRMENTS: Abnormal gait, decreased balance, and decreased strength.   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   REHAB POTENTIAL: Good  CLINICAL DECISION MAKING: Evolving/moderate complexity  EVALUATION COMPLEXITY: Moderate  PLAN:  PT FREQUENCY: 2x/week  PT DURATION: 6 weeks  PLANNED INTERVENTIONS: 97110-Therapeutic exercises, 97530- Therapeutic activity, [id number]- Neuromuscular re-education, 97535- Self Care, [id number]- Manual therapy, and Patient/Family education  PLAN FOR NEXT SESSION: Gait and balance training.     Tariyah Pendry,CHRIS, PTA 12/29/2023, 9:38 AM        "

## 2024-01-04 ENCOUNTER — Encounter: Payer: Self-pay | Admitting: *Deleted

## 2024-01-04 ENCOUNTER — Ambulatory Visit: Admitting: *Deleted

## 2024-01-04 DIAGNOSIS — R2681 Unsteadiness on feet: Secondary | ICD-10-CM | POA: Diagnosis not present

## 2024-01-04 NOTE — Therapy (Signed)
 " OUTPATIENT PHYSICAL THERAPY NEURO EVALUATION   Patient Name: Johnny Mathews MRN: [id number] DOB:09-28-1960, 63 y.o., male Today's Date: 01/04/2024  REFERRING PROVIDER: Fonda Dettinger MD  END OF SESSION:  PT End of Session - 01/04/24 0944     Visit Number 4    Number of Visits 12    Date for Recertification  02/03/24    PT Start Time 0930          Past Medical History:  Diagnosis Date   Allergy    Alpha galactosidase deficiency    Anemia    past hx    Arthritis    Asthma    Atrial fibrillation (HCC)    Blood transfusion without reported diagnosis    BPH (benign prostatic hypertrophy)    Cataract    removed left eye    Complication of anesthesia    Diabetes mellitus without complication (HCC)    Dysrhythmia    GERD (gastroesophageal reflux disease)    Heart murmur    History of kidney stones    HOH (hard of hearing)    Hyperlipidemia    Hypertension    Irregular heart beat    Muscular dystrophy (HCC)    PONV (postoperative nausea and vomiting)    Sleep apnea    Wears hearing aid in both ears    Past Surgical History:  Procedure Laterality Date   CARDIAC ELECTROPHYSIOLOGY STUDY AND ABLATION  11/2022   CATARACT EXTRACTION Left    COLONOSCOPY     ESOPHAGOGASTRODUODENOSCOPY N/A 08/27/2020   Procedure: ESOPHAGOGASTRODUODENOSCOPY (EGD);  Surgeon: Shyrl Linnie KIDD, MD;  Location: [facility] OR;  Service: Thoracic;  Laterality: N/A;   FEMUR FRACTURE SURGERY     INGUINAL HERNIA REPAIR Right 08/07/2021   Procedure: HERNIA REPAIR INGUINAL ADULT WITH MESH;  Surgeon: Kallie Manuelita BROCKS, MD;  Location: [facility];  Service: General;  Laterality: Right;   INSERTION OF MESH N/A 08/27/2020   Procedure: INSERTION OF ACELL 7.5 x 6cm GENTRIX SURGICAL MATRIX HIATAL MESH;  Surgeon: Shyrl Linnie KIDD, MD;  Location: [facility];  Service: Thoracic;  Laterality: N/A;   KIDNEY STONE SURGERY     x6   KNEE SURGERY Right    x2   MASS EXCISION Right 08/02/2018   Procedure: EXCISION RIGHT  LONG FINGER MASS, DEBRIDEMENT PROXIMAL INTERPHALANGEAL JOINT WITH ROTATION FLAP;  Surgeon: Murrell Drivers, MD;  Location: [facility];  Service: Orthopedics;  Laterality: Right;   NISSEN FUNDOPLICATION     [facility]    SHOULDER OPEN ROTATOR CUFF REPAIR     XI ROBOTIC ASSISTED HIATAL HERNIA REPAIR N/A 08/27/2020   Procedure: XI ROBOTIC ASSISTED REDO HIATAL HERNIA REPAIR;  Surgeon: Shyrl Linnie KIDD, MD;  Location: [facility] OR;  Service: Thoracic;  Laterality: N/A;   Patient Active Problem List   Diagnosis Date Noted   AC joint arthropathy 08/05/2023   Impingement of left shoulder 08/05/2023   Laceration of left thumb without foreign body without damage to nail 04/21/2023   Pulmonary infiltrates on CXR 04/09/2023   Myopathy 03/20/2023   Asthma 03/20/2023   Recurrent right inguinal hernia    PAF (paroxysmal atrial fibrillation) (HCC) 01/17/2021   Status post placement of implantable loop recorder 12/10/2020   Hiatal hernia 08/27/2020   Anemia 04/05/2020   BPH (benign prostatic hyperplasia) 11/09/2019   Aortic valve stenosis 07/19/2019   Coronary artery disease of native artery of native heart with stable angina pectoris 04/18/2019   Anginal equivalent 01/18/2019   Chronic heart failure with preserved  ejection fraction (HCC) 01/18/2019   DOE (dyspnea on exertion) 01/18/2019   Osteoarthritis of finger of right hand 07/21/2018   Hypogonadism, male 03/23/2018   Allergy to meat 06/21/2015   Hypertension associated with diabetes (HCC) 02/05/2015   Sciatica of left side 10/02/2014   Mitochondrial myopathy 07/25/2014   GERD (gastroesophageal reflux disease) 12/23/2012   Hyperlipidemia associated with type 2 diabetes mellitus (HCC) 12/23/2012   Diabetes mellitus, stable (HCC) 12/23/2012   Asthma, mild intermittent 12/23/2012   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    REFERRING DIAG: Recurrent falls.  Gait instability.    THERAPY DIAG:  Unsteadiness on  feet  Rationale for Evaluation and Treatment: Rehabilitation  SUBJECTIVE:                                                                                                                                                                                             SUBJECTIVE STATEMENT: Did okay after last Rx  PERTINENT HISTORY: MD.  H/o shoulder pain.  Right femoral fracture, knee surgery.     PRECAUTIONS: Fall.  Recommend using walking stick at all times.    WEIGHT BEARING RESTRICTIONS: No  FALLS: Has patient fallen in last 6 months? Yes. Number of falls not sure.  1 recently.    LIVING ENVIRONMENT: Lives with: lives with their spouse Lives in: House/apartment Has following equipment at home: None  PLOF: Independent  PATIENT GOALS: Not fall.    OBJECTIVE:   LOWER EXTREMITY ROM:     WNL.  LOWER EXTREMITY MMT:    Right hip flexion is 4 to 4+/5, knee and ankle strength is normal though a bit shaky via manual muscle testing.    GAIT: Wide base of support with left LE ER.  FUNCTIONAL TESTING:   5 time sit to stand:  21 seconds  TUG:  18 seconds  Romberg:  No loss of balance but wobbly and supervision/CGA required.  TREATMENT DATE:  balance 01/04/24: Nustep level 5 x 15 minutes  Rocker board  x 5 mins inverted BOSU x  5 mins   side stepping on balance pad    knee ext 20 # x 4 minutes   hams curls at 40# x 4  minutes   leg press 2 plates x 4 minutes  SLS each LE    PATIENT EDUCATION: Education details: Discussed using walking stick for safety.   Person educated: Patient Education method: Explanation Education comprehension: verbalized understanding  HOME EXERCISE PROGRAM:   GOALS: LONG TERM GOALS: Target date: 02/03/24  Ind with a HEP.  Goal status: MET  2.  Improve 5 sit to stand by 4-5 seconds.  Goal status: On  going  3.  Improve TUG by 3-4 seconds.  Goal status: On going  4.  Walk safety with walking stick or cane 750 feet on even and uneven terrain (lawn/grass).  Goal status: On going  5.  Perform a reciprocating stair gait with one railing.  Goal status: On going  ASSESSMENT:  CLINICAL IMPRESSION: The patient arrived today doing fairly well and was able to continue with Balance act.'s as well as LE strengthening and did well. Pt did have some calf and HS cramping today, but resolved with stretching.     OBJECTIVE IMPAIRMENTS: Abnormal gait, decreased balance, and decreased strength.   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   REHAB POTENTIAL: Good  CLINICAL DECISION MAKING: Evolving/moderate complexity  EVALUATION COMPLEXITY: Moderate  PLAN:  PT FREQUENCY: 2x/week  PT DURATION: 6 weeks  PLANNED INTERVENTIONS: 97110-Therapeutic exercises, 97530- Therapeutic activity, [id number]- Neuromuscular re-education, 97535- Self Care, 02859- Manual therapy, and Patient/Family education  PLAN FOR NEXT SESSION: Gait and balance training.     Arlynn Mcdermid,CHRIS, PTA 01/04/2024, 9:45 AM        "

## 2024-01-04 NOTE — Therapy (Signed)
 " OUTPATIENT PHYSICAL THERAPY TREATMENT   Patient Name: Johnny Mathews MRN: [id number] DOB:June 28, 1960, 63 y.o., male Today's Date: 01/04/2024  REFERRING PROVIDER: Fonda Dettinger MD  END OF SESSION:  PT End of Session - 01/04/24 0944     Visit Number 4    Number of Visits 12    Date for Recertification  02/03/24    PT Start Time 0930    PT Stop Time 1015    PT Time Calculation (min) 45 min           Past Medical History:  Diagnosis Date   Allergy    Alpha galactosidase deficiency    Anemia    past hx    Arthritis    Asthma    Atrial fibrillation (HCC)    Blood transfusion without reported diagnosis    BPH (benign prostatic hypertrophy)    Cataract    removed left eye    Complication of anesthesia    Diabetes mellitus without complication (HCC)    Dysrhythmia    GERD (gastroesophageal reflux disease)    Heart murmur    History of kidney stones    HOH (hard of hearing)    Hyperlipidemia    Hypertension    Irregular heart beat    Muscular dystrophy (HCC)    PONV (postoperative nausea and vomiting)    Sleep apnea    Wears hearing aid in both ears    Past Surgical History:  Procedure Laterality Date   CARDIAC ELECTROPHYSIOLOGY STUDY AND ABLATION  11/2022   CATARACT EXTRACTION Left    COLONOSCOPY     ESOPHAGOGASTRODUODENOSCOPY N/A 08/27/2020   Procedure: ESOPHAGOGASTRODUODENOSCOPY (EGD);  Surgeon: Shyrl Linnie KIDD, MD;  Location: [facility] OR;  Service: Thoracic;  Laterality: N/A;   FEMUR FRACTURE SURGERY     INGUINAL HERNIA REPAIR Right 08/07/2021   Procedure: HERNIA REPAIR INGUINAL ADULT WITH MESH;  Surgeon: Kallie Manuelita BROCKS, MD;  Location: [facility];  Service: General;  Laterality: Right;   INSERTION OF MESH N/A 08/27/2020   Procedure: INSERTION OF ACELL 7.5 x 6cm GENTRIX SURGICAL MATRIX HIATAL MESH;  Surgeon: Shyrl Linnie KIDD, MD;  Location: [facility];  Service: Thoracic;  Laterality: N/A;   KIDNEY STONE SURGERY     x6   KNEE SURGERY Right    x2   MASS  EXCISION Right 08/02/2018   Procedure: EXCISION RIGHT LONG FINGER MASS, DEBRIDEMENT PROXIMAL INTERPHALANGEAL JOINT WITH ROTATION FLAP;  Surgeon: Murrell Drivers, MD;  Location: [facility];  Service: Orthopedics;  Laterality: Right;   NISSEN FUNDOPLICATION     [facility]    SHOULDER OPEN ROTATOR CUFF REPAIR     XI ROBOTIC ASSISTED HIATAL HERNIA REPAIR N/A 08/27/2020   Procedure: XI ROBOTIC ASSISTED REDO HIATAL HERNIA REPAIR;  Surgeon: Shyrl Linnie KIDD, MD;  Location: [facility];  Service: Thoracic;  Laterality: N/A;   Patient Active Problem List   Diagnosis Date Noted   AC joint arthropathy 08/05/2023   Impingement of left shoulder 08/05/2023   Laceration of left thumb without foreign body without damage to nail 04/21/2023   Pulmonary infiltrates on CXR 04/09/2023   Myopathy 03/20/2023   Asthma 03/20/2023   Recurrent right inguinal hernia    PAF (paroxysmal atrial fibrillation) (HCC) 01/17/2021   Status post placement of implantable loop recorder 12/10/2020   Hiatal hernia 08/27/2020   Anemia 04/05/2020   BPH (benign prostatic hyperplasia) 11/09/2019   Aortic valve stenosis 07/19/2019   Coronary artery disease of native artery of native heart with  stable angina pectoris 04/18/2019   Anginal equivalent 01/18/2019   Chronic heart failure with preserved ejection fraction (HCC) 01/18/2019   DOE (dyspnea on exertion) 01/18/2019   Osteoarthritis of finger of right hand 07/21/2018   Hypogonadism, male 03/23/2018   Allergy to meat 06/21/2015   Hypertension associated with diabetes (HCC) 02/05/2015   Sciatica of left side 10/02/2014   Mitochondrial myopathy 07/25/2014   GERD (gastroesophageal reflux disease) 12/23/2012   Hyperlipidemia associated with type 2 diabetes mellitus (HCC) 12/23/2012   Diabetes mellitus, stable (HCC) 12/23/2012   Asthma, mild intermittent 12/23/2012   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    REFERRING DIAG: Recurrent falls.  Gait  instability.    THERAPY DIAG:  Unsteadiness on feet  Rationale for Evaluation and Treatment: Rehabilitation  SUBJECTIVE:                                                                                                                                                                                             SUBJECTIVE STATEMENT:  Did good after last Rx  PERTINENT HISTORY: MD.  H/o shoulder pain.  Right femoral fracture, knee surgery.     PRECAUTIONS: Fall.  Recommend using walking stick at all times.    WEIGHT BEARING RESTRICTIONS: No  FALLS: Has patient fallen in last 6 months? Yes. Number of falls not sure.  1 recently.    LIVING ENVIRONMENT: Lives with: lives with their spouse Lives in: House/apartment Has following equipment at home: None  PLOF: Independent  PATIENT GOALS: Not fall.    OBJECTIVE:   LOWER EXTREMITY ROM:     WNL.  LOWER EXTREMITY MMT:    Right hip flexion is 4 to 4+/5, knee and ankle strength is normal though a bit shaky via manual muscle testing.    GAIT: Wide base of support with left LE ER.  FUNCTIONAL TESTING:   5 time sit to stand:  21 seconds  TUG:  18 seconds  Romberg:  No loss of balance but wobbly and supervision/CGA required.  TREATMENT DATE:  balance 01/04/24:   12/29/23: Nustep level 4 x 15 minutes  Rocker board  x 5 mins inverted BOSU   x 5 mins  side stepping on balance pad foam   knee ext 20 # x 4   minutes   hams curls at 40# x 4  minutes   leg press 2 plates x 4 minutes   PATIENT EDUCATION: Education details: Discussed using walking stick for safety.   Person educated: Patient Education method: Explanation Education comprehension: verbalized understanding  HOME EXERCISE PROGRAM:   GOALS: LONG TERM GOALS: Target date: 02/03/24  Ind with a HEP.  Goal status: INITIAL  2.   Improve 5 sit to stand by 4-5 seconds.  Goal status: INITIAL  3.  Improve TUG by 3-4 seconds.  Goal status: INITIAL  4.  Walk safety with walking stick or cane 750 feet on even and uneven terrain (lawn/grass).  Goal status: INITIAL  5.  Perform a reciprocating stair gait with one railing.  Goal status: INITIAL  ASSESSMENT:  CLINICAL IMPRESSION:  The patient arrived today doing fairly well and was able to continue with LE strengthening and balance act.'s with progressions again  and did well.    OBJECTIVE IMPAIRMENTS: Abnormal gait, decreased balance, and decreased strength.   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   REHAB POTENTIAL: Good  CLINICAL DECISION MAKING: Evolving/moderate complexity  EVALUATION COMPLEXITY: Moderate  PLAN:  PT FREQUENCY: 2x/week  PT DURATION: 6 weeks  PLANNED INTERVENTIONS: 97110-Therapeutic exercises, 97530- Therapeutic activity, [id number]- Neuromuscular re-education, 97535- Self Care, 02859- Manual therapy, and Patient/Family education  PLAN FOR NEXT SESSION: Gait and balance training.     RAMSEUR,CHRIS, PTA, DPT 01/04/2024, 3:15 PM      "

## 2024-01-06 ENCOUNTER — Other Ambulatory Visit: Payer: Self-pay | Admitting: Family Medicine

## 2024-01-08 ENCOUNTER — Encounter: Payer: Self-pay | Admitting: Family Medicine

## 2024-01-08 ENCOUNTER — Other Ambulatory Visit (HOSPITAL_COMMUNITY): Payer: Self-pay

## 2024-01-08 ENCOUNTER — Telehealth: Payer: Self-pay | Admitting: Pharmacy Technician

## 2024-01-08 NOTE — Telephone Encounter (Signed)
 Pharmacy Patient Advocate Encounter   Received notification from Onbase that prior authorization for Ozempic  (1 MG/DOSE) 4MG /3ML pen-injectors is required/requested.   Insurance verification completed.   The patient is insured through [facility] [employer]/RX [employer].   Per test claim: PA required; PA started via CoverMyMeds. KEY [id number] . Waiting for clinical questions to populate.

## 2024-01-08 NOTE — Telephone Encounter (Signed)
 Pharmacy Patient Advocate Encounter  Received notification from [employer] that Prior Authorization for Ozempic  (1 MG/DOSE) 4MG /3ML pen-injectors  has been APPROVED from 01/08/24 to 01/07/25. Ran test claim, Copay is $0.00. This test claim was processed through [facility]- copay amounts may vary at other pharmacies due to pharmacy/plan contracts, or as the patient moves through the different stages of their insurance plan.   PA #/Case ID/Reference #: [id number]

## 2024-01-11 ENCOUNTER — Other Ambulatory Visit (HOSPITAL_COMMUNITY): Payer: Self-pay

## 2024-01-12 ENCOUNTER — Ambulatory Visit: Payer: Self-pay | Attending: Family Medicine | Admitting: *Deleted

## 2024-01-12 ENCOUNTER — Encounter: Payer: Self-pay | Admitting: *Deleted

## 2024-01-12 DIAGNOSIS — R2681 Unsteadiness on feet: Secondary | ICD-10-CM | POA: Diagnosis present

## 2024-01-12 NOTE — Therapy (Signed)
 " OUTPATIENT PHYSICAL THERAPY NEURO TREATMENT   Patient Name: Johnny Mathews MRN: [id number] DOB:1960/02/03, 64 y.o., male Today's Date: 01/12/2024  REFERRING PROVIDER: Fonda Dettinger MD  END OF SESSION:  PT End of Session - 01/12/24 1033     Visit Number 5    Number of Visits 12    Date for Recertification  02/03/24    PT Start Time 1015    PT Stop Time 1105    PT Time Calculation (min) 50 min          Past Medical History:  Diagnosis Date   Allergy    Alpha galactosidase deficiency    Anemia    past hx    Arthritis    Asthma    Atrial fibrillation (HCC)    Blood transfusion without reported diagnosis    BPH (benign prostatic hypertrophy)    Cataract    removed left eye    Complication of anesthesia    Diabetes mellitus without complication (HCC)    Dysrhythmia    GERD (gastroesophageal reflux disease)    Heart murmur    History of kidney stones    HOH (hard of hearing)    Hyperlipidemia    Hypertension    Irregular heart beat    Muscular dystrophy (HCC)    PONV (postoperative nausea and vomiting)    Sleep apnea    Wears hearing aid in both ears    Past Surgical History:  Procedure Laterality Date   CARDIAC ELECTROPHYSIOLOGY STUDY AND ABLATION  11/2022   CATARACT EXTRACTION Left    COLONOSCOPY     ESOPHAGOGASTRODUODENOSCOPY N/A 08/27/2020   Procedure: ESOPHAGOGASTRODUODENOSCOPY (EGD);  Surgeon: Shyrl Linnie KIDD, MD;  Location: [facility] OR;  Service: Thoracic;  Laterality: N/A;   FEMUR FRACTURE SURGERY     INGUINAL HERNIA REPAIR Right 08/07/2021   Procedure: HERNIA REPAIR INGUINAL ADULT WITH MESH;  Surgeon: Kallie Manuelita BROCKS, MD;  Location: [facility];  Service: General;  Laterality: Right;   INSERTION OF MESH N/A 08/27/2020   Procedure: INSERTION OF ACELL 7.5 x 6cm GENTRIX SURGICAL MATRIX HIATAL MESH;  Surgeon: Shyrl Linnie KIDD, MD;  Location: [facility] OR;  Service: Thoracic;  Laterality: N/A;   KIDNEY STONE SURGERY     x6   KNEE SURGERY Right    x2   MASS  EXCISION Right 08/02/2018   Procedure: EXCISION RIGHT LONG FINGER MASS, DEBRIDEMENT PROXIMAL INTERPHALANGEAL JOINT WITH ROTATION FLAP;  Surgeon: Murrell Drivers, MD;  Location: [facility];  Service: Orthopedics;  Laterality: Right;   NISSEN FUNDOPLICATION     [facility]    SHOULDER OPEN ROTATOR CUFF REPAIR     XI ROBOTIC ASSISTED HIATAL HERNIA REPAIR N/A 08/27/2020   Procedure: XI ROBOTIC ASSISTED REDO HIATAL HERNIA REPAIR;  Surgeon: Shyrl Linnie KIDD, MD;  Location: [facility] OR;  Service: Thoracic;  Laterality: N/A;   Patient Active Problem List   Diagnosis Date Noted   AC joint arthropathy 08/05/2023   Impingement of left shoulder 08/05/2023   Laceration of left thumb without foreign body without damage to nail 04/21/2023   Pulmonary infiltrates on CXR 04/09/2023   Myopathy 03/20/2023   Asthma 03/20/2023   Recurrent right inguinal hernia    PAF (paroxysmal atrial fibrillation) (HCC) 01/17/2021   Status post placement of implantable loop recorder 12/10/2020   Hiatal hernia 08/27/2020   Anemia 04/05/2020   BPH (benign prostatic hyperplasia) 11/09/2019   Aortic valve stenosis 07/19/2019   Coronary artery disease of native artery of native heart with  stable angina pectoris 04/18/2019   Anginal equivalent 01/18/2019   Chronic heart failure with preserved ejection fraction (HCC) 01/18/2019   DOE (dyspnea on exertion) 01/18/2019   Osteoarthritis of finger of right hand 07/21/2018   Hypogonadism, male 03/23/2018   Allergy to meat 06/21/2015   Hypertension associated with diabetes (HCC) 02/05/2015   Sciatica of left side 10/02/2014   Mitochondrial myopathy 07/25/2014   GERD (gastroesophageal reflux disease) 12/23/2012   Hyperlipidemia associated with type 2 diabetes mellitus (HCC) 12/23/2012   Diabetes mellitus, stable (HCC) 12/23/2012   Asthma, mild intermittent 12/23/2012   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    REFERRING DIAG: Recurrent falls.  Gait  instability.    THERAPY DIAG:  Unsteadiness on feet  Rationale for Evaluation and Treatment: Rehabilitation  SUBJECTIVE:                                                                                                                                                                                             SUBJECTIVE STATEMENT: Did okay after last Rx. Doing good today  PERTINENT HISTORY: MD.  H/o shoulder pain.  Right femoral fracture, knee surgery.     PRECAUTIONS: Fall.  Recommend using walking stick at all times.    WEIGHT BEARING RESTRICTIONS: No  FALLS: Has patient fallen in last 6 months? Yes. Number of falls not sure.  1 recently.    LIVING ENVIRONMENT: Lives with: lives with their spouse Lives in: House/apartment Has following equipment at home: None  PLOF: Independent  PATIENT GOALS: Not fall.    OBJECTIVE:   LOWER EXTREMITY ROM:     WNL.  LOWER EXTREMITY MMT:    Right hip flexion is 4 to 4+/5, knee and ankle strength is normal though a bit shaky via manual muscle testing.    GAIT: Wide base of support with left LE ER.  FUNCTIONAL TESTING:   5 time sit to stand:  21 seconds  TUG:  18 seconds  Romberg:  No loss of balance but wobbly and supervision/CGA required.  TREATMENT DATE:  balance 01/12/24: Nustep level 5 x 20  minutes  Rocker board  x 3 mins inverted BOSU x  3 mins  side stepping and tandem walks  on balance pad    knee ext 20 # x 3 minutes   hams curls at 40# x 3  minutes   leg press 2 plates x 4 minutes  SLS each LE  x 2 mins STS  x 5   13.07  secs  secs TUG 8.75 secs PATIENT EDUCATION: Education details: Discussed using walking stick for safety.   Person educated: Patient Education method: Explanation Education comprehension: verbalized understanding  HOME EXERCISE PROGRAM:   GOALS: LONG TERM  GOALS: Target date: 02/03/24  Ind with a HEP.  Goal status: MET  2.  Improve 5 sit to stand by 4-5 seconds. 13.07  secs Goal status: MET  3.  Improve TUG by 3-4 seconds.     8.75 secs Goal status: MET  4.  Walk safety with walking stick or cane 750 feet on even and uneven terrain (lawn/grass).  Goal status: On going  5.  Perform a reciprocating stair gait with one railing.  Goal status: On going  ASSESSMENT:  CLINICAL IMPRESSION: The patient arrived today doing fairly well. Rx focused on balance and LE strengthening exs. Pt did great and was able to meet TUG and STS goals today. OBJECTIVE IMPAIRMENTS: Abnormal gait, decreased balance, and decreased strength.   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   REHAB POTENTIAL: Good  CLINICAL DECISION MAKING: Evolving/moderate complexity  EVALUATION COMPLEXITY: Moderate  PLAN:  PT FREQUENCY: 2x/week  PT DURATION: 6 weeks  PLANNED INTERVENTIONS: 97110-Therapeutic exercises, 97530- Therapeutic activity, [id number]- Neuromuscular re-education, 97535- Self Care, [id number]- Manual therapy, and Patient/Family education  PLAN FOR NEXT SESSION: Gait and balance training.     Nashika Coker,CHRIS, PTA 01/12/2024, 1:29 PM        "

## 2024-01-13 NOTE — Progress Notes (Signed)
 " Electrophysiology Office Note:    Date:  01/14/2024   ID:  Nation, Cradle Sep 11, 1960, MRN [id number]  PCP:  Dettinger, Fonda LABOR, MD   [facility] Providers Cardiologist:  None     Referring MD: Nellene Quita SAUNDERS, PA   History of Present Illness:    Johnny Mathews is a 64 y.o. male with a medical history significant for atrial fibrillation, alpha galactose allergy, Kearns-Sayre mitochondrial myopathy, hypertension, CHF, referred for arrhythmia management.       Discussed the use of AI scribe software for clinical note transcription with the patient, who gave verbal consent to proceed.  History of Present Illness  He has atrial fibrillation treated with two ablation procedures in 2023 and November 2024 at [facility].  I reviewed the op notes for both procedures.  The second procedure showed reconnection of all 4 pulmonary veins requiring extensive reablation.  He was evaluated in the emergency room on October 20 and November 22, 2023, for atrial fibrillation and underwent DC cardioversion during both visits.  He is very symptomatic with atrial fibrillation and would like to continue to pursue rhythm control.  He has mitochondrial myopathy diagnosed about 30 years ago, which is relevant to procedural and anesthesia planning for further rhythm management.         Today, he reports that he feels well and has no complaints  EKGs/Labs/Other Studies Reviewed Today:     Echocardiogram:  TTE February 2017 LVEF 60 to 65%.  Mildly dilated left atrium.  TTE April 2023 - [facility] LVEF 60-65%.  Mild to moderate aortic valve stenosis.  Left atrium is mildly dilated.    EKG:   EKG Interpretation Date/Time:  Thursday January 14 2024 10:56:12 EST Ventricular Rate:  62 PR Interval:  178 QRS Duration:  114 QT Interval:  396 QTC Calculation: 401 R Axis:   -22  Text Interpretation: Normal sinus rhythm Normal ECG When compared with ECG of 30-Nov-2023 09:04, Premature  atrial complexes are no longer Present Confirmed by Nancey Scotts [id number]) on 01/14/2024 11:04:45 AM     Physical Exam:    VS:  BP (!) 144/90 (BP Location: Left Arm, Patient Position: Sitting, Cuff Size: Normal)   Pulse 62   Ht 5' 8.5 (1.74 m)   Wt 193 lb 4.8 oz (87.7 kg)   SpO2 97%   BMI 28.96 kg/m     Wt Readings from Last 3 Encounters:  01/14/24 193 lb 4.8 oz (87.7 kg)  12/25/23 186 lb 12.8 oz (84.7 kg)  12/07/23 190 lb (86.2 kg)     GEN: Well nourished, well developed in no acute distress CARDIAC: RRR, no murmurs, rubs, gallops RESPIRATORY:  Normal work of breathing MUSCULOSKELETAL: no edema    ASSESSMENT & PLAN:     Persistent atrial fibrillation Status post RF ablation March 2023 and 11/2022 by Dr. Leni; extensive re-ablation was required for the second procedure Status post cardioversion x 2 We discussed options.  I think because he has had 2 procedures by radiofrequency, additional mapping and potential ablation with pulsed field energy is reasonable.  We discussed the indication, rationale, logistics, anticipated benefits, and potential risks of the ablation procedure including but not limited to -- bleed at the groin access site, chest pain, damage to nearby organs such as the diaphragm, lungs, or esophagus, need for a drainage tube, or prolonged hospitalization. I explained that the risk for stroke, heart attack, need for open chest surgery, or even death is very low but  not zero. he  expressed understanding and wishes to proceed.   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

## 2024-01-14 ENCOUNTER — Encounter: Payer: Self-pay | Admitting: Cardiovascular Disease

## 2024-01-14 ENCOUNTER — Ambulatory Visit: Attending: Cardiovascular Disease | Admitting: Cardiovascular Disease

## 2024-01-14 VITALS — BP 144/90 | HR 62 | Ht 68.5 in | Wt 193.3 lb

## 2024-01-14 DIAGNOSIS — E1159 Type 2 diabetes mellitus with other circulatory complications: Secondary | ICD-10-CM | POA: Diagnosis not present

## 2024-01-14 DIAGNOSIS — I152 Hypertension secondary to endocrine disorders: Secondary | ICD-10-CM

## 2024-01-14 DIAGNOSIS — Z01812 Encounter for preprocedural laboratory examination: Secondary | ICD-10-CM

## 2024-01-14 DIAGNOSIS — I25118 Atherosclerotic heart disease of native coronary artery with other forms of angina pectoris: Secondary | ICD-10-CM | POA: Diagnosis not present

## 2024-01-14 DIAGNOSIS — I480 Paroxysmal atrial fibrillation: Secondary | ICD-10-CM | POA: Diagnosis not present

## 2024-01-14 NOTE — Patient Instructions (Signed)
" ° °   ° °   Medication Instructions:  Your physician recommends that you continue on your current medications as directed. Please refer to the Current Medication list given to you today.  *If you need a refill on your cardiac medications before your next appointment, please call your pharmacy*  Testing/Procedures: Ablation Your physician has recommended that you have an ablation. Catheter ablation is a medical procedure used to treat some cardiac arrhythmias (irregular heartbeats). During catheter ablation, a long, thin, flexible tube is put into a blood vessel in your groin (upper thigh), or neck. This tube is called an ablation catheter. It is then guided to your heart through the blood vessel. Radio frequency waves destroy small areas of heart tissue where abnormal heartbeats may cause an arrhythmia to start.   You are scheduled for Atrial Fibrillation Ablation on Monday, February 16 with Dr. Dr. Nancey. Please arrive at the Main Entrance A at [facility]: [address] [place], [place] [address] at 5:30 AM   What To Expect:  Labs: you will need to have lab work drawn within 30 days of your procedure. Please go to any [facility] location to have these drawn - no appointment is needed. Cardiac CT Scan: this will be done about 3-4 weeks prior to your procedure. You will be contacted to schedule this test. You will receive procedure instructions either through MyChart or in the mail 4-6 week prior to your procedure.  After your procedure we recommend no driving for 4 days, no lifting over 5 lbs for 7 days, and no work or strenuous activity for 7 days.  Please contact our office at [phone] if you have any questions.    Follow-Up: We will contact you to schedule your post-procedure appointments.       "

## 2024-01-15 ENCOUNTER — Ambulatory Visit: Payer: Self-pay

## 2024-01-16 ENCOUNTER — Other Ambulatory Visit: Payer: Self-pay | Admitting: "Endocrinology

## 2024-01-21 ENCOUNTER — Telehealth: Payer: Self-pay

## 2024-01-21 NOTE — Telephone Encounter (Signed)
-----   Message from Nurse Aldona SAUNDERS, RN sent at 01/14/2024  6:11 PM EST ----- Regarding: Afib 02/22/2024 Mealor Important: list procedure date as first item in subject line, followed by procedure type (e.g., 09/18/23 AFib ablation)  Precert:  MD: Mealor Type of ablation: A-fib Diagnosis: Afib CPT code: A-fib (06343) Ablation scheduled (date/time): 02/16 730am  Procedure:  Added to calendar? Yes Orders entered? No, >30 days before procedure Letter complete? No, >30 days before procedure Scheduled with cath lab? Yes Any medications to hold? Routine Labs ordered (CBC, BMET, PT/INR if on warfarin): Yes Mapping system: Doesn't matter CARTO/OPAL rep notified? No Cardiac CT needed? Yes, ordered Dye allergy? No Pre-meds ordered and instructions given? N/A Letter method: MyChart H&P: 01/8 Device: Yes, ILR - BSX  Follow-up:  Cassie/Angel, please schedule Routine.  Covering RN - please send this message to [employer], EP scheduler, EP Scheduling pool, EP [employer], and CT scheduler (Brittany Lynch/Stephanie Mogg), if indicated.

## 2024-01-22 ENCOUNTER — Ambulatory Visit: Admitting: Family Medicine

## 2024-01-22 ENCOUNTER — Emergency Department (HOSPITAL_BASED_OUTPATIENT_CLINIC_OR_DEPARTMENT_OTHER)
Admission: EM | Admit: 2024-01-22 | Discharge: 2024-01-22 | Disposition: A | Discharge status: Home / Self Care | Attending: Emergency Medicine | Admitting: Emergency Medicine

## 2024-01-22 ENCOUNTER — Ambulatory Visit: Payer: Self-pay

## 2024-01-22 ENCOUNTER — Encounter (HOSPITAL_BASED_OUTPATIENT_CLINIC_OR_DEPARTMENT_OTHER): Payer: Self-pay

## 2024-01-22 ENCOUNTER — Other Ambulatory Visit: Payer: Self-pay

## 2024-01-22 ENCOUNTER — Emergency Department (HOSPITAL_BASED_OUTPATIENT_CLINIC_OR_DEPARTMENT_OTHER)

## 2024-01-22 ENCOUNTER — Encounter: Payer: Self-pay | Admitting: Family Medicine

## 2024-01-22 VITALS — BP 136/70 | HR 68 | Temp 97.5°F | Ht 68.5 in | Wt 192.2 lb

## 2024-01-22 DIAGNOSIS — S0031XA Abrasion of nose, initial encounter: Secondary | ICD-10-CM | POA: Diagnosis not present

## 2024-01-22 DIAGNOSIS — I4891 Unspecified atrial fibrillation: Secondary | ICD-10-CM | POA: Diagnosis not present

## 2024-01-22 DIAGNOSIS — E785 Hyperlipidemia, unspecified: Secondary | ICD-10-CM | POA: Insufficient documentation

## 2024-01-22 DIAGNOSIS — W101XXA Fall (on)(from) sidewalk curb, initial encounter: Secondary | ICD-10-CM | POA: Insufficient documentation

## 2024-01-22 DIAGNOSIS — I2510 Atherosclerotic heart disease of native coronary artery without angina pectoris: Secondary | ICD-10-CM | POA: Diagnosis not present

## 2024-01-22 DIAGNOSIS — Z7985 Long-term (current) use of injectable non-insulin antidiabetic drugs: Secondary | ICD-10-CM | POA: Diagnosis not present

## 2024-01-22 DIAGNOSIS — Z79899 Other long term (current) drug therapy: Secondary | ICD-10-CM | POA: Insufficient documentation

## 2024-01-22 DIAGNOSIS — E119 Type 2 diabetes mellitus without complications: Secondary | ICD-10-CM | POA: Insufficient documentation

## 2024-01-22 DIAGNOSIS — S0993XA Unspecified injury of face, initial encounter: Secondary | ICD-10-CM | POA: Diagnosis not present

## 2024-01-22 DIAGNOSIS — K219 Gastro-esophageal reflux disease without esophagitis: Secondary | ICD-10-CM | POA: Insufficient documentation

## 2024-01-22 DIAGNOSIS — W19XXXA Unspecified fall, initial encounter: Secondary | ICD-10-CM

## 2024-01-22 DIAGNOSIS — S0990XA Unspecified injury of head, initial encounter: Secondary | ICD-10-CM

## 2024-01-22 DIAGNOSIS — Z7901 Long term (current) use of anticoagulants: Secondary | ICD-10-CM | POA: Diagnosis not present

## 2024-01-22 DIAGNOSIS — S0992XA Unspecified injury of nose, initial encounter: Secondary | ICD-10-CM | POA: Diagnosis present

## 2024-01-22 DIAGNOSIS — I351 Nonrheumatic aortic (valve) insufficiency: Secondary | ICD-10-CM | POA: Diagnosis not present

## 2024-01-22 DIAGNOSIS — S0081XA Abrasion of other part of head, initial encounter: Secondary | ICD-10-CM | POA: Diagnosis not present

## 2024-01-22 NOTE — Telephone Encounter (Signed)
 noted

## 2024-01-22 NOTE — ED Triage Notes (Signed)
 Pt advises he face planted approx 2hrs ago, on eliquis for afib. Pt advises swelling to maxillary area, no obvious deformity noted, no bruising. Small abrasion to bridge of nose, bleeding controlled at time of triage

## 2024-01-22 NOTE — Progress Notes (Signed)
" ° °  Acute Office Visit  Subjective:     Patient ID: Johnny Mathews, male    DOB: [birth year], 64 y.o.   MRN: [id number]  Chief Complaint  Patient presents with   Fall    HPI  History of Present Illness   Johnny Mathews is a 64 year old male who presents with facial swelling and bruising after a fall.  Facial trauma - Fell face-first onto a curb approximately one hour prior to presentation after tripping while getting out of his truck. Also hit head - Immediate swelling and bruising of the face, particularly around the right lower jaw and chin area - Pain in right lower jaw/chin - Able to open and close jaw - No popping or clicking of jaw  Anticoagulation therapy - Currently taking Eliquis for A. fib  Associated symptoms - No dizziness, changes in vision, confusion, nausea, vomiting, or balance issues following the incident  Cardiac history and recent procedures - History of three ablation procedures, most recent on February 16th - CT scan of the chest performed on January 29th related to ablation procedures       ROS As per HPI.     Objective:    BP 136/70   Pulse 68   Temp (!) 97.5 F (36.4 C) (Temporal)   Ht 5' 8.5 (1.74 m)   Wt 192 lb 3.2 oz (87.2 kg)   SpO2 98%   BMI 28.80 kg/m    Physical Exam Vitals and nursing note reviewed.  Constitutional:      General: He is not in acute distress.    Appearance: He is not toxic-appearing or diaphoretic.  HENT:     Head:     Jaw: Tenderness and swelling present. No trismus, pain on movement or malocclusion.     Comments: Shallow laceration from right eyebrow to nasal bridge. No active bleeding. Minimal bruising.   Swelling and tenderness to right lower lip and right lower jawline/chin.     Mouth/Throat:     Mouth: Mucous membranes are moist.     Dentition: Abnormal dentition (edentulous). Dental caries present.  Pulmonary:     Effort: Pulmonary effort is normal. No respiratory distress.  Neurological:      Mental Status: He is alert and oriented to person, place, and time. Mental status is at baseline.  Psychiatric:        Mood and Affect: Mood normal.        Behavior: Behavior normal.     No results found for any visits on 01/22/24.      Assessment & Plan:   Johnny Mathews was seen today for fall.  Diagnoses and all orders for this visit:  Fall, initial encounter  Facial injury, initial encounter  Injury of head, initial encounter  Chronic anticoagulation   Discussed need for urgent imaging given head injury and facial injury from fall with chronic anticoagulation. Discussed risk of ICH given age, anticoagulation and injury. No malocclusion or trismus of jaw on exam. He does have swelling and tenderness to right lower jaw. Discussed will likely need CT or panoramic xray for possible fracture. Discussed that this is late afternoon on a Friday and will likely not be able to get get STAT imaging ordered in a timely manner. Recommended ER evaluation for timely evaluation and imaging. He declines EMS. He feels comfortable driving. Address for [facility] provided.   The patient indicates understanding of these issues and agrees with the plan.  Annabella CHRISTELLA Search, FNP   "

## 2024-01-22 NOTE — Discharge Instructions (Addendum)
 It was a pleasure taking care of you today. You were seen in the Emergency Department for evaluation of facial pain and swelling after a fall. Your work-up was reassuring. Your CT scans were all negative for fracture and bleeding.  As we discussed, you may take Tylenol  and/or ibuprofen as needed for pain and inflammation.  You may alternate between these 2 medications every 6 hours.  You may also apply ice to the area of swelling on your face.  I recommend not leaving the ice on for more than 20 minutes at a time. Refer to the attached documentation for further management of your symptoms. Follow up with your PCP if your symptoms continue.  Please return to the ER if you experience chest pain, trouble breathing, intractable nausea/vomiting or any other life threatening illnesses.

## 2024-01-22 NOTE — Patient Instructions (Signed)
 [facility]  [address], [place], [place] [address] Phone:[phone]

## 2024-01-22 NOTE — ED Provider Notes (Signed)
 " [place] [facility] AT [facility] Provider Note   CSN: [id number] Arrival date & time: 01/22/24  1556     Patient presents with: Fall (On eliquis)   Johnny Mathews is a 64 y.o. male with past medical history of A-fib, on Eliquis, CAD, GERD, hyperlipidemia, diabetes, aortic valve stenosis, who presents emergency department for evaluation of a fall on blood thinners.  Patient reports that he fell off the curb and landed on his face.  He denies any LOC.  He denies any loss of balance, dizziness or headache.  Patient reports that he landed on the front of his face, including his nose and his maxilla.  He states this happened approximately 2 hours ago, and he was originally not going to seek care, but he noticed some bruising and therefore wanted to get checked out.   Fall       Prior to Admission medications  Medication Sig Start Date End Date Taking? Authorizing Provider  albuterol  (VENTOLIN  HFA) 108 (90 Base) MCG/ACT inhaler INHALE 2 PUFFS EVERY 4 HOURS AS NEEDED 12/11/22   Dettinger, Fonda LABOR, MD  amitriptyline  (ELAVIL ) 100 MG tablet TAKE 2 TABLETS BY MOUTH AT BEDTIME 07/30/23   Dettinger, Fonda LABOR, MD  blood glucose meter kit and supplies Dispense based on patient and insurance preference. Use up to four times daily as directed. (FOR ICD-10 E10.9, E11.9). Pt states needs One Touch Verio Meter and one touch delica plus test strips 11/27/21   Dettinger, Fonda LABOR, MD  Blood Glucose Monitoring Suppl (CONTOUR BLOOD GLUCOSE SYSTEM) w/Device KIT Test blood sugars four times daily 06/30/17   Dettinger, Fonda LABOR, MD  budesonide -formoterol  (SYMBICORT ) 80-4.5 MCG/ACT inhaler Take 2 puffs first thing in am and then another 2 puffs about 12 hours later. 04/16/23   Darlean Ozell NOVAK, MD  cetirizine (ZYRTEC) 10 MG tablet Take 10 mg by mouth every evening.    [provider]  ELIQUIS 5 MG TABS tablet Take 5 mg by mouth 2 (two) times daily. 01/28/23   [provider]   EPINEPHRINE  0.3 mg/0.3 mL IJ SOAJ injection INJECT 0.3ML (0.3MG ) IM ONCE 03/16/23   Dettinger, Fonda LABOR, MD  esomeprazole  (NEXIUM ) 40 MG capsule Take 1 capsule (40 mg total) by mouth daily at 12 noon. Take 30-60 min before first meal of the day 04/15/23   Darlean Ozell NOVAK, MD  famotidine  (PEPCID ) 20 MG tablet TAKE ONE (1) TABLET BY MOUTH TWO (2) TIMES DAILY 10/14/23   Dettinger, Fonda LABOR, MD  finasteride  (PROSCAR ) 5 MG tablet TAKE ONE (1) TABLET EACH DAY 12/11/22   Dettinger, Fonda LABOR, MD  fluticasone  (FLONASE ) 50 MCG/ACT nasal spray USE 1 TO 2 SPRAYS IN EACH NOSTRIL DAILY 09/25/23   Dettinger, Fonda LABOR, MD  furosemide  (LASIX ) 20 MG tablet Take 1 tablet (20 mg total) by mouth daily as needed. 03/05/23   Dettinger, Fonda LABOR, MD  glucose blood (CONTOUR NEXT TEST) test strip Test blood sugars four times daily 06/30/17   Dettinger, Fonda LABOR, MD  ketoconazole (NIZORAL) 2 % cream SMARTSIG:1 Application Topical 1 to 2 Times Daily 11/09/23   [provider]  Lancets [facility] CATHRYNE PLUS [place]) MISC  11/27/21   [provider]  losartan  (COZAAR ) 50 MG tablet Take 1 tablet (50 mg total) by mouth daily. 03/05/23   Dettinger, Fonda LABOR, MD  meloxicam  (MOBIC ) 15 MG tablet Take 15 mg by mouth daily. 12/08/23   [provider]  metoprolol  succinate (TOPROL -XL) 25 MG 24 hr tablet  Take 25 mg by mouth 2 (two) times daily. 12/11/22   [provider]  montelukast  (SINGULAIR ) 10 MG tablet TAKE ONE TABLET DAILY AT BEDTIME 01/08/24   Dettinger, Joshua A, MD  mupirocin ointment (BACTROBAN) 2 % Apply 1 Application topically 2 (two) times daily. 10/23/23   [provider]  OZEMPIC , 1 MG/DOSE, 4 MG/3ML SOPN INJECT 1MG  AS DIRECTED ONCE A WEEK 01/08/24   Dettinger, Fonda LABOR, MD  pravastatin  (PRAVACHOL ) 80 MG tablet Take 1 tablet (80 mg total) by mouth daily. 03/05/23   Dettinger, Fonda LABOR, MD  Spacer/Aero-Holding Raguel FRENCH Use with inhaler daily 06/14/20   Icard, Adine CROME, DO  spironolactone  (ALDACTONE) 25 MG tablet Take 12.5 mg by mouth daily. 09/24/23   [provider]  SYRINGE-NEEDLE, DISP, 3 ML 21G X 1-1/2 3 ML MISC Use to inject testosterone  every week 07/22/21   Nida, Gebreselassie W, MD  tadalafil  (CIALIS ) 10 MG tablet TAKE 1 TABLET BY MOUTH EVERY OTHER DAY AS NEEDED 06/30/23   Zollie Lowers, MD  tamsulosin  (FLOMAX ) 0.4 MG CAPS capsule TAKE 1 CAPSULE DAILY AFTER SUPPER Patient taking differently: Take 0.4 mg by mouth every morning. 03/05/23   Dettinger, Fonda LABOR, MD  testosterone  cypionate (DEPOTESTOSTERONE CYPIONATE) 200 MG/ML injection INJECT 100MG  (0.5ML) EVERY 10 DAYS (FOR INTRMUSCULAR USE ONLY) 01/19/24   Nida, Gebreselassie W, MD    Allergies: Egg protein-containing drug products, Septra [sulfamethoxazole-trimethoprim], and Sulfonamide derivatives    Review of Systems  Skin:  Positive for wound.    Updated Vital Signs BP 138/85   Pulse 67   Temp 98.6 F (37 C)   Resp 20   SpO2 99%   Physical Exam Vitals and nursing note reviewed.  Constitutional:      Appearance: Normal appearance.  HENT:     Head: Normocephalic.     Nose:     Comments: Patient with small circumscribed abrasion on the top of his nose and between his eyebrows.    Mouth/Throat:     Mouth: Mucous membranes are moist.     Comments: Patient with some dried blood on the maxilla.  No active bleeding at this time.  Patient also tender to palpation on the maxilla.  Mandible nontender. Eyes:     General: No scleral icterus.       Right eye: No discharge.        Left eye: No discharge.     Conjunctiva/sclera: Conjunctivae normal.  Cardiovascular:     Rate and Rhythm: Normal rate and regular rhythm.     Pulses: Normal pulses.  Pulmonary:     Effort: Pulmonary effort is normal.     Breath sounds: Normal breath sounds.  Abdominal:     General: There is no distension.     Tenderness: There is no abdominal tenderness.  Musculoskeletal:        General: No deformity.     Cervical back:  Normal range of motion.  Skin:    General: Skin is warm and dry.     Capillary Refill: Capillary refill takes less than 2 seconds.  Neurological:     Mental Status: He is alert.     Motor: No weakness.  Psychiatric:        Mood and Affect: Mood normal.     (all labs ordered are listed, but only abnormal results are displayed) Labs Reviewed - No data to display  EKG: None  Radiology: CT Maxillofacial Wo Contrast Result Date: 01/22/2024 EXAM: CT OF THE FACE WITHOUT CONTRAST  01/22/2024 04:29:29 PM TECHNIQUE: CT of the face was performed without the administration of intravenous contrast. Multiplanar reformatted images are provided for review. Automated exposure control, iterative reconstruction, and/or weight based adjustment of the mA/kV was utilized to reduce the radiation dose to as low as reasonably achievable. COMPARISON: None available. CLINICAL HISTORY: fall on thinners FINDINGS: FACIAL BONES: No evidence of acute maxillofacial fracture. The maxilla is edentulous. Hypoplastic appearance of the maxillary sinuses, left more so than right. There is mucosal thickening and secretions throughout the left maxillary sinus. No mandibular dislocation. No suspicious bone lesion. ORBITS: Globes are intact. No acute traumatic injury. No inflammatory change. SINUSES AND MASTOIDS: Hypoplastic appearance of the maxillary sinuses, left more so than right. There is mucosal thickening and secretions throughout the left maxillary sinus. No acute abnormality of the other visualized paranasal sinuses or mastoid air cells. SOFT TISSUES: No acute abnormality. IMPRESSION: 1. No acute maxillofacial fracture. 2. Secretions in the hypoplastic left maxillary sinus. Recommend correlation for acute sinusitis. Electronically signed by: Donnice Mania MD 01/22/2024 04:51 PM EST RP Workstation: [id number]   CT Cervical Spine Wo Contrast Result Date: 01/22/2024 EXAM: CT CERVICAL SPINE WITHOUT CONTRAST 01/22/2024 04:29:29 PM  TECHNIQUE: CT of the cervical spine was performed without the administration of intravenous contrast. Multiplanar reformatted images are provided for review. Automated exposure control, iterative reconstruction, and/or weight based adjustment of the mA/kV was utilized to reduce the radiation dose to as low as reasonably achievable. COMPARISON: None available. CLINICAL HISTORY: fall on thinners FINDINGS: BONES AND ALIGNMENT: No acute fracture or traumatic malalignment. DEGENERATIVE CHANGES: Mild disc space narrowing posteriorly at C5-C6. Disc osteophyte complexes at C5-C6 and C6-C7 without significant osseous spinal canal stenosis. Uncovertebral hypertrophy at multiple levels. Foraminal stenosis most pronounced on the left at C5-C6. SOFT TISSUES: No prevertebral soft tissue swelling. IMPRESSION: 1. No evidence of acute traumatic injury. Electronically signed by: Donnice Mania MD 01/22/2024 04:41 PM EST RP Workstation: [id number]   CT Head Wo Contrast Result Date: 01/22/2024 EXAM: CT HEAD WITHOUT CONTRAST 01/22/2024 04:29:29 PM TECHNIQUE: CT of the head was performed without the administration of intravenous contrast. Automated exposure control, iterative reconstruction, and/or weight based adjustment of the mA/kV was utilized to reduce the radiation dose to as low as reasonably achievable. COMPARISON: MRI head 11/01/2021. CLINICAL HISTORY: fall on thinners FINDINGS: BRAIN AND VENTRICLES: No acute hemorrhage. No evidence of acute infarct. No hydrocephalus. No extra-axial collection. No mass effect or midline shift. ORBITS: Bilateral lens replacement. SINUSES: Hypoplastic and opacified maxillary sinuses. SOFT TISSUES AND SKULL: No acute soft tissue abnormality. No skull fracture. IMPRESSION: 1. No acute intracranial abnormality. Electronically signed by: Donnice Mania MD 01/22/2024 04:38 PM EST RP Workstation: [id number]    Procedures   Medications Ordered in the ED - No data to display                                Medical Decision Making Amount and/or Complexity of Data Reviewed Radiology: ordered.   This patient presents to the ED for concern of fall on blood thinners, this involves an extensive number of treatment options, and is a complaint that carries with it a high risk of complications and morbidity.   Differential diagnosis includes: Subdural hemorrhage, subarachnoid hemorrhage, facial fractures, laceration, hematoma  Co morbidities:  see above  Lab Tests:  Not indicated  Imaging Studies:  I ordered imaging studies including CT head, C-spine, max face I independently visualized and interpreted  imaging which showed no evidence of bleeding or fracture I agree with the radiologist interpretation  Cardiac Monitoring/ECG:  The patient was maintained on a cardiac monitor.  I personally viewed and interpreted the cardiac monitored which showed an underlying rhythm of: Sinus rhythm  Medicines ordered and prescription drug management:  I offered medications to the patient, but he declined  Test Considered:   none  Critical Interventions:   none  Consultations Obtained: None  Problem List / ED Course:     ICD-10-CM   1. Fall involving sidewalk curb, initial encounter  W10.1XXA       MDM: 64 year old male who presents emergency department for evaluation after a fall on blood thinners.  He did land on his face.  CT head, C-spine and max face ordered.  CT is negative for fracture or bleeding.  I did offer the patient pain medicine, however he declined.  I provided patient education and fact that he can alternate between Tylenol  and ibuprofen as needed for pain and swelling.  I also informed him he can apply ice to his right cheek for up to 20 minutes at a time.  Patient verbalizes understanding to this.  Patient's vital signs are stable.  Patient is appropriate for discharge at this time.   Dispostion:  After consideration of the diagnostic results and the patients  response to treatment, I feel that the patient would benefit from supportive care.    Final diagnoses:  Fall involving sidewalk curb, initial encounter    ED Discharge Orders     None          Torrence Marry GORMAN DEVONNA 01/22/24 1746    Emil Share, DO 01/22/24 1837  "

## 2024-01-22 NOTE — Telephone Encounter (Signed)
 FYI Only or Action Required?: FYI only for provider: appointment scheduled on 01/22/24.  Patient was last seen in primary care on 12/25/2023 by Alcus Oneil ORN, FNP.  Called Nurse Triage reporting Fall.  Symptoms began today.  Interventions attempted: Nothing.  Symptoms are: unchanged.  Triage Disposition: See PCP When Office is Open (Within 3 Days)  Patient/caregiver understands and will follow disposition?: Yes  Message from [place] S sent at 01/22/2024  2:25 PM EST  Reason for Triage: [place] on concrete, hitting face   Reason for Disposition  [1] Taking Coumadin (warfarin) or other strong blood thinner AND [2] falling is a recurrent problem  Answer Assessment - Initial Assessment Questions 1. MECHANISM: How did the fall happen?     Tripped when getting out of his truck 3. ONSET: When did the fall happen? (e.g., minutes, hours, or days ago)     Today approx 30 mins  4. LOCATION: What part of the body hit the ground? (e.g., back, buttocks, head, hips, knees, hands, head, stomach)     Head first 5. INJURY: Did you hurt (injure) yourself when you fell? If Yes, ask: What did you injure? Tell me more about this? (e.g., body area; type of injury; pain severity)     Nose forehead and chin bruising  6. PAIN: Is there any pain? If Yes, ask: How bad is the pain? (e.g., Scale 0-10; or none, mild,      5/10 7. SIZE: For cuts, bruises, or swelling, ask: How large is it? (e.g., inches or centimeters)      Swelling inside chin  9. OTHER SYMPTOMS: Do you have any other symptoms? (e.g., dizziness, fever, weakness; new-onset or worsening).      no 10. CAUSE: What do you think caused the fall (or falling)? (e.g., dizzy spell, tripped)       Tripped over curb  Protocols used: Falls and [facility]

## 2024-01-23 ENCOUNTER — Ambulatory Visit: Payer: Self-pay | Admitting: Family Medicine

## 2024-01-23 ENCOUNTER — Other Ambulatory Visit: Payer: Self-pay | Admitting: Family Medicine

## 2024-01-23 DIAGNOSIS — G4709 Other insomnia: Secondary | ICD-10-CM

## 2024-01-25 ENCOUNTER — Telehealth (HOSPITAL_COMMUNITY): Payer: Self-pay

## 2024-01-25 ENCOUNTER — Other Ambulatory Visit: Payer: Self-pay

## 2024-01-25 LAB — CBC
Hematocrit: 37.5 % (ref 37.5–51.0)
Hemoglobin: 12.2 g/dL — ABNORMAL LOW (ref 13.0–17.7)
MCH: 31.8 pg (ref 26.6–33.0)
MCHC: 32.5 g/dL (ref 31.5–35.7)
MCV: 98 fL — ABNORMAL HIGH (ref 79–97)
Platelets: 279 x10E3/uL (ref 150–450)
RBC: 3.84 x10E6/uL — ABNORMAL LOW (ref 4.14–5.80)
RDW: 12.5 % (ref 11.6–15.4)
WBC: 5.8 x10E3/uL (ref 3.4–10.8)

## 2024-01-25 MED ORDER — LIDOCAINE VISCOUS HCL 2 % MT SOLN: 5.0000 mL | Freq: Three times a day (TID) | OROMUCOSAL | 0 refills | Status: DC | PRN

## 2024-01-25 MED ORDER — LIDOCAINE VISCOUS HCL 2 % MT SOLN: 5.0000 mL | Freq: Three times a day (TID) | OROMUCOSAL | 0 refills | Status: AC | PRN

## 2024-01-25 NOTE — Telephone Encounter (Signed)
 Sent Magic mouthwash for him

## 2024-01-25 NOTE — Telephone Encounter (Signed)
 Attempted to reach patient to discuss upcoming procedure, no answer. Unable to leave VM.

## 2024-01-25 NOTE — Telephone Encounter (Signed)
 Seen Johnny Mathews on 1/16. She is out of the office today. Ok to send in mouthwash? Pt did go to ER on 1/16 after visit here in the office.

## 2024-01-26 LAB — BASIC METABOLIC PANEL WITH GFR
BUN/Creatinine Ratio: 15 (ref 10–24)
BUN: 19 mg/dL (ref 8–27)
CO2: 23 mmol/L (ref 20–29)
Calcium: 9.3 mg/dL (ref 8.6–10.2)
Chloride: 103 mmol/L (ref 96–106)
Creatinine, Ser: 1.3 mg/dL — ABNORMAL HIGH (ref 0.76–1.27)
Glucose: 74 mg/dL (ref 70–99)
Potassium: 4.7 mmol/L (ref 3.5–5.2)
Sodium: 141 mmol/L (ref 134–144)
eGFR: 61 mL/min/1.73

## 2024-01-26 NOTE — Telephone Encounter (Signed)
 Attempted to reach patient to discuss upcoming procedure, no answer. Left VM for patient to return call.

## 2024-01-27 ENCOUNTER — Ambulatory Visit: Payer: Self-pay

## 2024-01-27 NOTE — Telephone Encounter (Signed)
 Apt scheduled.

## 2024-01-27 NOTE — Telephone Encounter (Signed)
 GLF fall several days ago, was seen in ED, negative CT, requesting physical and follow up today - appt made.   FYI Only or Action Required?: FYI only for provider: appointment scheduled on 2/20.  Patient was last seen in primary care on 01/22/2024 by Joesph Annabella HERO, FNP.  Called Nurse Triage reporting Appointment.  Symptoms began several days ago.  Interventions attempted: Other: was evaluated in ED 1/16.  Symptoms are: stable.  Triage Disposition: See PCP Within 2 Weeks  Patient/caregiver understands and will follow disposition?: Yes   Reason for Disposition  Requesting regular office appointment  Answer Assessment - Initial Assessment Questions 1. REASON FOR CALL: What is the main reason for your call? or How can I best help you? Pt requesting physical appt/ED follow up appt. Appt scheduled  Protocols used: Information Only Call - No Triage-A-AH

## 2024-01-28 ENCOUNTER — Ambulatory Visit (HOSPITAL_COMMUNITY)
Admission: RE | Admit: 2024-01-28 | Discharge: 2024-01-28 | Disposition: A | Discharge status: Home / Self Care | Source: Ambulatory Visit | Attending: Cardiovascular Disease | Admitting: Cardiovascular Disease

## 2024-01-28 DIAGNOSIS — I480 Paroxysmal atrial fibrillation: Secondary | ICD-10-CM | POA: Diagnosis present

## 2024-01-28 MED ORDER — IOHEXOL 350 MG/ML SOLN
80.0000 mL | Freq: Once | INTRAVENOUS | Status: AC | PRN
  Administered 2024-01-28: 80 mL via INTRAVENOUS

## 2024-02-01 ENCOUNTER — Ambulatory Visit (HOSPITAL_COMMUNITY)

## 2024-02-03 NOTE — Telephone Encounter (Signed)
 IL[facility] ALERT TRANSMISSION:  Alert remote transmission: Symptom + episode Event occurred 1/26 @ 16:01, duration , mean HR 121.   Hx of AF, not always good rate control, burden 11%,   Sent my chart message to assess patient. (Increase in AF and currently ongoing over past 1-2 weeks, not the best v-rate control when out of rhythm.  Scheduled for ablation on 2/16 with Dr. Nancey.  Also, has been followed previously by AF clinic.   ? Whether consideration for increasing Metoprolol  in interim or continue to monitor. Currently on Metoprolol  Succinate 25mg  bid.

## 2024-02-03 NOTE — Telephone Encounter (Signed)
 Spoke with patient over the phone.  He says he has definitely been feeling more symptoms from his AF and faster heart rates.  Denies any other acute changes in diet, health or meds and no missed doses.   Discussed use of Metoprolol  and Dr. Marko instructions okay to take extra dose if needed.  Patient verbalize understanding and agrees with the plan.  States he almost went ahead and did so at time of recent events but knows okay to now if needed.    BP runs borderline high, no concerns for hypotension.   Patient given device clinic number if needed to call or communicate symptoms or concerns.

## 2024-02-04 NOTE — Addendum Note (Signed)
 Addended by: GERSHON LAMP C on: 02/04/2024 12:46 PM   Modules accepted: Orders

## 2024-02-09 ENCOUNTER — Other Ambulatory Visit (HOSPITAL_COMMUNITY): Payer: Self-pay

## 2024-02-09 ENCOUNTER — Telehealth: Payer: Self-pay | Admitting: Pharmacy Technician

## 2024-02-09 MED ORDER — METOPROLOL SUCCINATE ER 25 MG PO TB24: 25.0000 mg | ORAL_TABLET | Freq: Two times a day (BID) | ORAL | 1 refills | Status: AC

## 2024-02-09 NOTE — Addendum Note (Signed)
 Addended by: Rosmary Dionisio, MANDY C on: 02/09/2024 06:00 PM   Modules accepted: Orders

## 2024-02-09 NOTE — Telephone Encounter (Signed)
" ° ° °  Ran 90 tablets for 30 days to get the 1 tablet twice a day with additional as needed  "

## 2024-02-09 NOTE — Telephone Encounter (Addendum)
 Spoke to patient.   States he has been having fast heart rates (feels certain he is in AF) and feeling more of a pressure than a pain in his chest most of the day today. Has SOB, but associates that more to his asthma.   Pulse rates on his apple watch  all day have been reading in the 120's - 130's, currently 133.  He did take the extra Metoprolol  (25mg ) today and it did not help.   He does not want to go to the ER but agrees if symptoms get worse or become more severe he will go to [facility] ER.  Reports rate/rhythm control medication that he has been given in the past has not worked.  His ablation is not scheduled until 02/22/24, unsure if that can be expedited given his condition or if there are other options (DCCV) that can help more urgently in interim.  Reports he has had 4 DCCV procedures in the past.   He agrees to see Daril Kicks, PA in the AF clinic in the morning at 10am to see if there are any expedited options available.   I attempted to get a transmission to come through his remote monitor this evening.  Patient reports it says sent on his end;however, it never came through website.  If still does not come through in the morning, will have device nurse come down and connect if needed while seeing AF clinic.

## 2024-02-10 ENCOUNTER — Ambulatory Visit (HOSPITAL_COMMUNITY)
Admission: RE | Admit: 2024-02-10 | Discharge: 2024-02-10 | Attending: Physician Assistant | Admitting: Physician Assistant

## 2024-02-10 VITALS — BP 110/80 | HR 126 | Ht 68.5 in | Wt 195.9 lb

## 2024-02-10 DIAGNOSIS — D6869 Other thrombophilia: Secondary | ICD-10-CM

## 2024-02-10 DIAGNOSIS — I484 Atypical atrial flutter: Secondary | ICD-10-CM | POA: Diagnosis not present

## 2024-02-10 DIAGNOSIS — I4819 Other persistent atrial fibrillation: Secondary | ICD-10-CM

## 2024-02-10 DIAGNOSIS — I4891 Unspecified atrial fibrillation: Secondary | ICD-10-CM

## 2024-02-10 NOTE — Patient Instructions (Addendum)
 Hold Ozempic  until after procedure   Cardioversion scheduled qnm:Qmpijb, February 6th   - Arrive at the [facility] ([address])  and check in with ADMITTING at 8:00 AM   - Do not eat or drink anything after midnight the night prior to your procedure.   - Take all your morning medication (except diabetic medications) with a sip of water prior to arrival.  - Do NOT miss any doses of your blood thinner - if you should miss a dose or take a dose more than 4 hours late -- please notify our office immediately.  - You will not be able to drive home after your procedure. Please ensure you have a responsible adult to drive you home. You will need someone with you for 24 hours post procedure.     - Expect to be in the procedural area approximately 2 hours.   - If you feel as if you go back into normal rhythm prior to scheduled cardioversion, please notify our office immediately.   If your procedure is canceled in the cardioversion suite you will be charged a cancellation fee.    Hold below medications 7 days prior to scheduled procedure/anesthesia.  Restart medication on the normal dosing day after scheduled procedure/anesthesia   Semaglutide  (Ozempic )

## 2024-02-10 NOTE — H&P (View-Only) (Signed)
 "   Primary Care Physician: Dettinger, Fonda LABOR, MD Primary Cardiologist: None Electrophysiologist: Eulas FORBES Furbish, MD  Referring Physician: ED   Johnny Mathews is a 64 y.o. 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 He is s/p afib ablation 04/04/21 and 11/25/22. Patient is on Eliquis for stroke prevention.    He was seen at the ED 10/20 and 11/16 for afib and underwent DCCV at both visits.   Patient returns for follow up for atrial fibrillation and atrial flutter. Discussed the use of AI scribe software for clinical note transcription with the patient, who gave verbal consent to proceed.  He is experiencing persistent episodes of tachycardia, with recent episodes starting a week ago. The heart rate has been particularly high, reaching 125 to 130 beats per minute over the past three to four days. He has been taking an extra dose of metoprolol  in the middle of the day to manage the heart rate, but it remains elevated. He feels presyncopal when walking short distances. He previously reported a pressure in his chest but is not having those symptoms currently. No bleeding issues on anticoagulation. There were no specific triggers for this episode that he could identify.      Today, he  denies symptoms of shortness of breath, orthopnea, PND, lower extremity edema, dizziness, syncope, bleeding, or neurologic sequela. The patient is tolerating medications without difficulties and is otherwise without complaint today.    Atrial Fibrillation Risk Factors:  he does have symptoms or diagnosis of sleep apnea. he does not have a history of rheumatic fever. he does have a history of alcohol use. ~1 beer daily The patient does have a history of early  familial atrial fibrillation or other arrhythmias. Mother patient in AF clinic.   Atrial Fibrillation Management history:  Previous antiarrhythmic drugs: flecainide  Previous cardioversions: 01/03/22, 10/26/23, 11/22/23 Previous ablations: 04/04/21, 11/25/22 Anticoagulation history: Eliquis  ROS- All systems are reviewed and negative except as per the HPI above.  Past Medical History:  Diagnosis Date   Allergy    Alpha galactosidase deficiency    Anemia    past hx    Arthritis    Asthma    Atrial fibrillation (HCC)    Blood transfusion without reported diagnosis    BPH (benign prostatic hypertrophy)    Cataract    removed left eye    Complication of anesthesia    Diabetes mellitus without complication (HCC)    Dysrhythmia    GERD (gastroesophageal reflux disease)    Heart murmur    History of kidney stones    HOH (hard of hearing)    Hyperlipidemia    Hypertension    Irregular heart beat    Muscular dystrophy (HCC)    PONV (postoperative nausea and vomiting)    Sleep apnea    Wears hearing aid in both ears     Current Outpatient Medications  Medication Sig Dispense Refill   albuterol  (VENTOLIN  HFA) 108 (90 Base) MCG/ACT inhaler INHALE 2 PUFFS EVERY 4 HOURS AS NEEDED 8.5 g 2   amitriptyline  (ELAVIL ) 100 MG tablet Take 2 tablets (200 mg total) by mouth at bedtime. **NEEDS TO BE SEEN BEFORE NEXT REFILL** 60 tablet 0   blood glucose meter kit and supplies Dispense based on  patient and insurance preference. Use up to four times daily as directed. (FOR ICD-10 E10.9, E11.9). Pt states needs One Touch Verio Meter and one touch delica plus test strips 1 each 0   Blood Glucose Monitoring Suppl (CONTOUR BLOOD GLUCOSE SYSTEM) w/Device KIT Test blood sugars four times daily 200 each 5   budesonide -formoterol  (SYMBICORT ) 80-4.5 MCG/ACT inhaler Take 2 puffs first thing in am and then another 2 puffs about 12 hours later. 10.2 g 12   cetirizine (ZYRTEC) 10 MG tablet Take 10 mg by mouth  every evening.     ELIQUIS 5 MG TABS tablet Take 5 mg by mouth 2 (two) times daily.     EPINEPHRINE  0.3 mg/0.3 mL IJ SOAJ injection INJECT 0.3ML (0.3MG ) IM ONCE 2 each 1   esomeprazole  (NEXIUM ) 40 MG capsule Take 1 capsule (40 mg total) by mouth daily at 12 noon. Take 30-60 min before first meal of the day 30 capsule 11   famotidine  (PEPCID ) 20 MG tablet TAKE ONE (1) TABLET BY MOUTH TWO (2) TIMES DAILY 180 tablet 0   finasteride  (PROSCAR ) 5 MG tablet TAKE ONE (1) TABLET EACH DAY 90 tablet 1   fluticasone  (FLONASE ) 50 MCG/ACT nasal spray USE 1 TO 2 SPRAYS IN EACH NOSTRIL DAILY 48 g 1   furosemide  (LASIX ) 20 MG tablet Take 1 tablet (20 mg total) by mouth daily as needed. (Patient taking differently: Take 20 mg by mouth as needed.) 90 tablet 3   glucose blood (CONTOUR NEXT TEST) test strip Test blood sugars four times daily 200 each 5   ketoconazole (NIZORAL) 2 % cream SMARTSIG:1 Application Topical 1 to 2 Times Daily     Lancets (ONETOUCH DELICA PLUS LANCET33G) MISC      losartan  (COZAAR ) 50 MG tablet Take 1 tablet (50 mg total) by mouth daily. 90 tablet 3   magic mouthwash (lidocaine , diphenhydrAMINE , alum & mag hydroxide) suspension Swish and swallow 5 mLs 3 (three) times daily as needed for mouth pain. Mix equal parts lidocaine  and diphenhydramine  and Maalox 360 mL 0   meloxicam  (MOBIC ) 15 MG tablet Take 15 mg by mouth daily.     metoprolol  succinate (TOPROL  XL) 25 MG 24 hr tablet Take 1 tablet (25 mg total) by mouth 2 (two) times daily. May take 1 additional, once daily, as needed for rapid heart beats.  Monitor your blood pressure. 90 tablet 1   montelukast  (SINGULAIR ) 10 MG tablet TAKE ONE TABLET DAILY AT BEDTIME 90 tablet 1   mupirocin ointment (BACTROBAN) 2 % Apply 1 Application topically 2 (two) times daily.     OZEMPIC , 1 MG/DOSE, 4 MG/3ML SOPN INJECT 1MG  AS DIRECTED ONCE A WEEK 9 mL 0   pravastatin  (PRAVACHOL ) 80 MG tablet Take 1 tablet (80 mg total) by mouth daily. 90 tablet 3    Spacer/Aero-Holding Chambers DEVI Use with inhaler daily 1 each 0   spironolactone (ALDACTONE) 25 MG tablet Take 12.5 mg by mouth daily.     SYRINGE-NEEDLE, DISP, 3 ML 21G X 1-1/2 3 ML MISC Use to inject testosterone  every week 50 each 0   tadalafil  (CIALIS ) 10 MG tablet TAKE 1 TABLET BY MOUTH EVERY OTHER DAY AS NEEDED 30 tablet 2   tamsulosin  (FLOMAX ) 0.4 MG CAPS capsule TAKE 1 CAPSULE DAILY AFTER SUPPER 90 capsule 3   testosterone  cypionate (DEPOTESTOSTERONE CYPIONATE) 200 MG/ML injection INJECT 100MG  (0.5ML) EVERY 10 DAYS (FOR INTRMUSCULAR USE ONLY) 10 mL 0   No current facility-administered medications for this encounter.    Physical  Exam: BP 110/80   Pulse (!) 126   Ht 5' 8.5 (1.74 m)   Wt 88.9 kg   BMI 29.35 kg/m   GEN: Well nourished, well developed in no acute distress CARDIAC: Irregularly irregular rate and rhythm, no murmurs, rubs, gallops RESPIRATORY:  Clear to auscultation without rales, wheezing or rhonchi  ABDOMEN: Soft, non-tender, non-distended EXTREMITIES:  No edema; No deformity    Wt Readings from Last 3 Encounters:  02/10/24 88.9 kg  01/22/24 87.2 kg  01/14/24 87.7 kg     EKG Interpretation Date/Time:  Wednesday February 10 2024 09:55:00 EST Ventricular Rate:  126 PR Interval:  158 QRS Duration:  138 QT Interval:  354 QTC Calculation: 512 R Axis:   -34  Text Interpretation: Atrial flutter with RVR Left axis deviation Right bundle branch block Abnormal ECG When compared with ECG of 14-Jan-2024 10:56, Atrial flutter has replaced Sinus rhythm Right bundle branch block is now Present Confirmed by Matasha Smigelski ([id number]) on 02/10/2024 10:06:26 AM    Echo 04/26/21 [facility]) demonstrated  Summary   1. The left ventricle is normal in size with normal wall thickness.    2. The left ventricular systolic function is normal, LVEF is visually  estimated at 60-65%.    3. There is mild to moderate aortic valve stenosis.    4. The left atrium is mildly dilated in size.     5. The right ventricle is normal in size, with normal systolic function.    CHA2DS2-VASc Score = 5  The patient's score is based upon: CHF History: 1 HTN History: 1 Diabetes History: 1 Stroke History: 0 Vascular Disease History: 1 Age Score: 1 Gender Score: 0       ASSESSMENT AND PLAN: Persistent Atrial Fibrillation/atrial flutter (ICD10:  I48.19) The patient's CHA2DS2-VASc score is 5, indicating a 7.2% annual risk of stroke.   S/p afib ablation 03/2021 and 11/2022 with Dr Leni. Scheduled for PFA with Dr Nancey 02/22/24 He is in atrial flutter with rapid rates today, extra doses of BB have not helped to control his rate. Given his symptoms of presyncope, will arrange for DCCV prior to ablation. Recent labs reviewed. Patient held his dose of Ozempic  yesterday.  Will not start AAD at this time due to interactions with amitriptyline .  Continue Eliquis 5 mg BID Continue Toprol  25 mg BID with an extra dose of 25 mg PRN for elevated heart rates.   Secondary Hypercoagulable State (ICD10:  D68.69) The patient is at significant risk for stroke/thromboembolism based upon his CHA2DS2-VASc Score of 5.  Continue Apixaban (Eliquis). No bleeding issues.   CAD Non obstructive per [facility] notes No anginal symptoms  HTN Stable on current regimen  VHD Mild to moderate AS   Follow up for afib ablation as scheduled.    Informed Consent   Shared Decision Making/Informed Consent The risks (stroke, cardiac arrhythmias rarely resulting in the need for a temporary or permanent pacemaker, skin irritation or burns and complications associated with conscious sedation including aspiration, arrhythmia, respiratory failure and death), benefits (restoration of normal sinus rhythm) and alternatives of a direct current cardioversion were explained in detail to Johnny Mathews and he agrees to proceed.       [facility] [address] [place], [place] [address] [phone] "

## 2024-02-10 NOTE — Progress Notes (Signed)
 "   Primary Care Physician: Johnny Mathews LABOR, MD Primary Cardiologist: None Electrophysiologist: Johnny FORBES Furbish, MD  Referring Physician: ED   Johnny Mathews is a 64 y.o. male with a history of CAD, Johnny Mathews mitochondrial myopathy, HTN, CHF, VHD, HLD, DM, alpha galactose allergy, atrial fibrillation who presents for consultation in the [facility].  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 He is s/p afib ablation 04/04/21 and 11/25/22. Patient is on Eliquis for stroke prevention.    He was seen at the ED 10/20 and 11/16 for afib and underwent DCCV at both visits.   Patient returns for follow up for atrial fibrillation and atrial flutter. Discussed the use of AI scribe software for clinical note transcription with the patient, who gave verbal consent to proceed.  He is experiencing persistent episodes of tachycardia, with recent episodes starting a week ago. The heart rate has been particularly high, reaching 125 to 130 beats per minute over the past three to four days. He has been taking an extra dose of metoprolol  in the middle of the day to manage the heart rate, but it remains elevated. He feels presyncopal when walking short distances. He previously reported a pressure in his chest but is not having those symptoms currently. No bleeding issues on anticoagulation. There were no specific triggers for this episode that he could identify.      Today, he  denies symptoms of shortness of breath, orthopnea, PND, lower extremity edema, dizziness, syncope, bleeding, or neurologic sequela. The patient is tolerating medications without difficulties and is otherwise without complaint today.    Atrial Fibrillation Risk Factors:  he does have symptoms or diagnosis of sleep apnea. he does not have a history of rheumatic fever. he does have a history of alcohol use. ~1 beer daily The patient does have a history of early  familial atrial fibrillation or other arrhythmias. Mother patient in AF clinic.   Atrial Fibrillation Management history:  Previous antiarrhythmic drugs: flecainide  Previous cardioversions: 01/03/22, 10/26/23, 11/22/23 Previous ablations: 04/04/21, 11/25/22 Anticoagulation history: Eliquis  ROS- All systems are reviewed and negative except as per the HPI above.  Past Medical History:  Diagnosis Date   Allergy    Alpha galactosidase deficiency    Anemia    past hx    Arthritis    Asthma    Atrial fibrillation (HCC)    Blood transfusion without reported diagnosis    BPH (benign prostatic hypertrophy)    Cataract    removed left eye    Complication of anesthesia    Diabetes mellitus without complication (HCC)    Dysrhythmia    GERD (gastroesophageal reflux disease)    Heart murmur    History of kidney stones    HOH (hard of hearing)    Hyperlipidemia    Hypertension    Irregular heart beat    Muscular dystrophy (HCC)    PONV (postoperative nausea and vomiting)    Sleep apnea    Wears hearing aid in both ears     Current Outpatient Medications  Medication Sig Dispense Refill   albuterol  (VENTOLIN  HFA) 108 (90 Base) MCG/ACT inhaler INHALE 2 PUFFS EVERY 4 HOURS AS NEEDED 8.5 g 2   amitriptyline  (ELAVIL ) 100 MG tablet Take 2 tablets (200 mg total) by mouth at bedtime. **NEEDS TO BE SEEN BEFORE NEXT REFILL** 60 tablet 0   blood glucose meter kit and supplies Dispense based on  patient and insurance preference. Use up to four times daily as directed. (FOR ICD-10 E10.9, E11.9). Pt states needs One Touch Verio Meter and one touch delica plus test strips 1 each 0   Blood Glucose Monitoring Suppl (CONTOUR BLOOD GLUCOSE SYSTEM) w/Device KIT Test blood sugars four times daily 200 each 5   budesonide -formoterol  (SYMBICORT ) 80-4.5 MCG/ACT inhaler Take 2 puffs first thing in am and then another 2 puffs about 12 hours later. 10.2 g 12   cetirizine (ZYRTEC) 10 MG tablet Take 10 mg by mouth  every evening.     ELIQUIS 5 MG TABS tablet Take 5 mg by mouth 2 (two) times daily.     EPINEPHRINE  0.3 mg/0.3 mL IJ SOAJ injection INJECT 0.3ML (0.3MG ) IM ONCE 2 each 1   esomeprazole  (NEXIUM ) 40 MG capsule Take 1 capsule (40 mg total) by mouth daily at 12 noon. Take 30-60 min before first meal of the day 30 capsule 11   famotidine  (PEPCID ) 20 MG tablet TAKE ONE (1) TABLET BY MOUTH TWO (2) TIMES DAILY 180 tablet 0   finasteride  (PROSCAR ) 5 MG tablet TAKE ONE (1) TABLET EACH DAY 90 tablet 1   fluticasone  (FLONASE ) 50 MCG/ACT nasal spray USE 1 TO 2 SPRAYS IN EACH NOSTRIL DAILY 48 g 1   furosemide  (LASIX ) 20 MG tablet Take 1 tablet (20 mg total) by mouth daily as needed. (Patient taking differently: Take 20 mg by mouth as needed.) 90 tablet 3   glucose blood (CONTOUR NEXT TEST) test strip Test blood sugars four times daily 200 each 5   ketoconazole (NIZORAL) 2 % cream SMARTSIG:1 Application Topical 1 to 2 Times Daily     Lancets (ONETOUCH DELICA PLUS LANCET33G) MISC      losartan  (COZAAR ) 50 MG tablet Take 1 tablet (50 mg total) by mouth daily. 90 tablet 3   magic mouthwash (lidocaine , diphenhydrAMINE , alum & mag hydroxide) suspension Swish and swallow 5 mLs 3 (three) times daily as needed for mouth pain. Mix equal parts lidocaine  and diphenhydramine  and Maalox 360 mL 0   meloxicam  (MOBIC ) 15 MG tablet Take 15 mg by mouth daily.     metoprolol  succinate (TOPROL  XL) 25 MG 24 hr tablet Take 1 tablet (25 mg total) by mouth 2 (two) times daily. May take 1 additional, once daily, as needed for rapid heart beats.  Monitor your blood pressure. 90 tablet 1   montelukast  (SINGULAIR ) 10 MG tablet TAKE ONE TABLET DAILY AT BEDTIME 90 tablet 1   mupirocin ointment (BACTROBAN) 2 % Apply 1 Application topically 2 (two) times daily.     OZEMPIC , 1 MG/DOSE, 4 MG/3ML SOPN INJECT 1MG  AS DIRECTED ONCE A WEEK 9 mL 0   pravastatin  (PRAVACHOL ) 80 MG tablet Take 1 tablet (80 mg total) by mouth daily. 90 tablet 3    Spacer/Aero-Holding Chambers DEVI Use with inhaler daily 1 each 0   spironolactone (ALDACTONE) 25 MG tablet Take 12.5 mg by mouth daily.     SYRINGE-NEEDLE, DISP, 3 ML 21G X 1-1/2 3 ML MISC Use to inject testosterone  every week 50 each 0   tadalafil  (CIALIS ) 10 MG tablet TAKE 1 TABLET BY MOUTH EVERY OTHER DAY AS NEEDED 30 tablet 2   tamsulosin  (FLOMAX ) 0.4 MG CAPS capsule TAKE 1 CAPSULE DAILY AFTER SUPPER 90 capsule 3   testosterone  cypionate (DEPOTESTOSTERONE CYPIONATE) 200 MG/ML injection INJECT 100MG  (0.5ML) EVERY 10 DAYS (FOR INTRMUSCULAR USE ONLY) 10 mL 0   No current facility-administered medications for this encounter.    Physical  Exam: BP 110/80   Pulse (!) 126   Ht 5' 8.5 (1.74 m)   Wt 88.9 kg   BMI 29.35 kg/m   GEN: Well nourished, well developed in no acute distress CARDIAC: Irregularly irregular rate and rhythm, no murmurs, rubs, gallops RESPIRATORY:  Clear to auscultation without rales, wheezing or rhonchi  ABDOMEN: Soft, non-tender, non-distended EXTREMITIES:  No edema; No deformity    Wt Readings from Last 3 Encounters:  02/10/24 88.9 kg  01/22/24 87.2 kg  01/14/24 87.7 kg     EKG Interpretation Date/Time:  Wednesday February 10 2024 09:55:00 EST Ventricular Rate:  126 PR Interval:  158 QRS Duration:  138 QT Interval:  354 QTC Calculation: 512 R Axis:   -34  Text Interpretation: Atrial flutter with RVR Left axis deviation Right bundle branch block Abnormal ECG When compared with ECG of 14-Jan-2024 10:56, Atrial flutter has replaced Sinus rhythm Right bundle branch block is now Present Confirmed by Matasha Smigelski ([id number]) on 02/10/2024 10:06:26 AM    Echo 04/26/21 [facility]) demonstrated  Summary   1. The left ventricle is normal in size with normal wall thickness.    2. The left ventricular systolic function is normal, LVEF is visually  estimated at 60-65%.    3. There is mild to moderate aortic valve stenosis.    4. The left atrium is mildly dilated in size.     5. The right ventricle is normal in size, with normal systolic function.    CHA2DS2-VASc Score = 5  The patient's score is based upon: CHF History: 1 HTN History: 1 Diabetes History: 1 Stroke History: 0 Vascular Disease History: 1 Age Score: 1 Gender Score: 0       ASSESSMENT AND PLAN: Persistent Atrial Fibrillation/atrial flutter (ICD10:  I48.19) The patient's CHA2DS2-VASc score is 5, indicating a 7.2% annual risk of stroke.   S/p afib ablation 03/2021 and 11/2022 with Dr Johnny Mathews. Scheduled for PFA with Dr Nancey 02/22/24 He is in atrial flutter with rapid rates today, extra doses of BB have not helped to control his rate. Given his symptoms of presyncope, will arrange for DCCV prior to ablation. Recent labs reviewed. Patient held his dose of Ozempic  yesterday.  Will not start AAD at this time due to interactions with amitriptyline .  Continue Eliquis 5 mg BID Continue Toprol  25 mg BID with an extra dose of 25 mg PRN for elevated heart rates.   Secondary Hypercoagulable State (ICD10:  D68.69) The patient is at significant risk for stroke/thromboembolism based upon his CHA2DS2-VASc Score of 5.  Continue Apixaban (Eliquis). No bleeding issues.   CAD Non obstructive per [facility] notes No anginal symptoms  HTN Stable on current regimen  VHD Mild to moderate AS   Follow up for afib ablation as scheduled.    Informed Consent   Shared Decision Making/Informed Consent The risks (stroke, cardiac arrhythmias rarely resulting in the need for a temporary or permanent pacemaker, skin irritation or burns and complications associated with conscious sedation including aspiration, arrhythmia, respiratory failure and death), benefits (restoration of normal sinus rhythm) and alternatives of a direct current cardioversion were explained in detail to Mr. Koleen and he agrees to proceed.       [facility] [address] [place], [place] [address] [phone] "

## 2024-02-11 NOTE — Anesthesia Preprocedure Evaluation (Signed)
 "                                  Anesthesia Evaluation  Patient identified by MRN, date of birth, ID band Patient awake    Reviewed: Allergy & Precautions, NPO status , Patient's Chart, lab work & pertinent test results  History of Anesthesia Complications (+) PONV and history of anesthetic complications  Airway Mallampati: III  TM Distance: >3 FB Neck ROM: Full   Comment: Previous grade IIa view with MAC 4, easy mask with OPA Dental  (+) Dental Advisory Given   Pulmonary neg shortness of breath, asthma , sleep apnea , neg COPD, neg recent URI, former smoker   Pulmonary exam normal breath sounds clear to auscultation       Cardiovascular hypertension (losartan , metoprolol ), Pt. on medications and Pt. on home beta blockers (-) angina + CAD, +CHF and + DOE  (-) Past MI, (-) Cardiac Stents and (-) CABG + dysrhythmias Atrial Fibrillation + Valvular Problems/Murmurs (mild-to-moderate) AS  Rhythm:Irregular Rate:Normal  HLD  TTE 04/26/2021: Summary   1. The left ventricle is normal in size with normal wall thickness.    2. The left ventricular systolic function is normal, LVEF is visually  estimated at 60-65%.    3. There is mild to moderate aortic valve stenosis.    4. The left atrium is mildly dilated in size.    5. The right ventricle is normal in size, with normal systolic function.     Neuro/Psych neg Seizures  Neuromuscular disease (sciatica)    GI/Hepatic Neg liver ROS, hiatal hernia,GERD  Medicated,,  Endo/Other  diabetes, Type 2    Renal/GU negative Renal ROS     Musculoskeletal  (+) Arthritis , Osteoarthritis,  Muscular dystrophy   Abdominal   Peds  Hematology  (+) Blood dyscrasia, anemia Lab Results      Component                Value               Date                      WBC                      5.8                 01/25/2024                HGB                      12.2 (L)            01/25/2024                HCT                       37.5                01/25/2024                MCV                      98 (H)              01/25/2024  PLT                      279                 01/25/2024              Anesthesia Other Findings Alpha galactosidase deficiency, mitochondrial myopathy  Last Ozempic : >1 week ago  Reproductive/Obstetrics                              Anesthesia Physical Anesthesia Plan  ASA: 3  Anesthesia Plan: General   Post-op Pain Management: Minimal or no pain anticipated   Induction: Intravenous  PONV Risk Score and Plan: 3 and Treatment may vary due to age or medical condition  Airway Management Planned: Natural Airway and Nasal Cannula  Additional Equipment:   Intra-op Plan:   Post-operative Plan:   Informed Consent: I have reviewed the patients History and Physical, chart, labs and discussed the procedure including the risks, benefits and alternatives for the proposed anesthesia with the patient or authorized representative who has indicated his/her understanding and acceptance.     Dental advisory given  Plan Discussed with: CRNA and Anesthesiologist  Anesthesia Plan Comments: (Risks of general anesthesia discussed including, but not limited to, sore throat, hoarse voice, chipped/damaged teeth, injury to vocal cords, nausea and vomiting, allergic reactions, lung infection, heart attack, stroke, and death. All questions answered. )         Anesthesia Quick Evaluation  "

## 2024-02-12 ENCOUNTER — Ambulatory Visit (HOSPITAL_COMMUNITY)
Admission: RE | Admit: 2024-02-12 | Discharge: 2024-02-12 | Disposition: A | Source: Home / Self Care | Attending: Internal Medicine | Admitting: Internal Medicine

## 2024-02-12 ENCOUNTER — Encounter (HOSPITAL_COMMUNITY): Admission: RE | Disposition: A | Payer: Self-pay | Source: Home / Self Care | Attending: Internal Medicine

## 2024-02-12 ENCOUNTER — Encounter (HOSPITAL_COMMUNITY): Payer: Self-pay | Admitting: Anesthesiology

## 2024-02-12 ENCOUNTER — Other Ambulatory Visit: Payer: Self-pay

## 2024-02-12 ENCOUNTER — Encounter (HOSPITAL_COMMUNITY): Payer: Self-pay | Admitting: Internal Medicine

## 2024-02-12 DIAGNOSIS — I4819 Other persistent atrial fibrillation: Secondary | ICD-10-CM

## 2024-02-12 MED ORDER — SODIUM CHLORIDE 0.9 % IV SOLN: INTRAVENOUS | Status: DC

## 2024-02-12 MED ORDER — PROPOFOL 10 MG/ML IV BOLUS
INTRAVENOUS | Status: DC | PRN
  Administered 2024-02-12: 80 mg via INTRAVENOUS

## 2024-02-12 MED ORDER — LIDOCAINE HCL (PF) 2 % IJ SOLN
INTRAMUSCULAR | Status: DC | PRN
  Administered 2024-02-12: 60 mg via INTRADERMAL

## 2024-02-12 NOTE — Transfer of Care (Signed)
 Immediate Anesthesia Transfer of Care Note  Patient: Johnny Mathews  Procedure(s) Performed: CARDIOVERSION  Patient Location: Cath Lab  Anesthesia Type:General  Level of Consciousness: drowsy and patient cooperative  Airway & Oxygen Therapy: Patient Spontanous Breathing and Patient connected to nasal cannula oxygen  Post-op Assessment: Report given to RN, Post -op Vital signs reviewed and stable, Patient moving all extremities, and Patient moving all extremities X 4  Post vital signs: Reviewed and stable  Last Vitals:  Vitals Value Taken Time  BP 112/76 02/12/24 08:42  Temp    Pulse 68 02/12/24 08:42  Resp 12 02/12/24 08:42  SpO2 100 02/12/24 08:42    Last Pain:  Vitals:   02/12/24 0809  TempSrc: Temporal  PainSc: 0-No pain         Complications: No notable events documented.

## 2024-02-12 NOTE — Interval H&P Note (Signed)
 History and Physical Interval Note:  02/12/2024 8:16 AM  Johnny Mathews  has presented today for surgery, with the diagnosis of afib.  The various methods of treatment have been discussed with the patient and family. After consideration of risks, benefits and other options for treatment, the patient has consented to  Procedures: CARDIOVERSION (N/A) as a surgical intervention.  The patient's history has been reviewed, patient examined, no change in status, stable for surgery.  I have reviewed the patient's chart and labs.  Questions were answered to the patient's satisfaction.     Vinie [name]

## 2024-02-12 NOTE — Anesthesia Postprocedure Evaluation (Signed)
"   Anesthesia Post Note  Patient: Johnny Mathews  Procedure(s) Performed: CARDIOVERSION     Patient location during evaluation: PACU Anesthesia Type: General Level of consciousness: awake Pain management: pain level controlled Vital Signs Assessment: post-procedure vital signs reviewed and stable Respiratory status: spontaneous breathing, nonlabored ventilation and respiratory function stable Cardiovascular status: blood pressure returned to baseline and stable Postop Assessment: no apparent nausea or vomiting Anesthetic complications: no   No notable events documented.  Last Vitals:  Vitals:   02/12/24 0905 02/12/24 0910  BP: 103/74 100/72  Pulse: 67 73  Resp: 15 13  Temp:    SpO2: 99% 99%    Last Pain:  Vitals:   02/12/24 0840  TempSrc:   PainSc: 0-No pain                 [name]      "

## 2024-02-12 NOTE — CV Procedure (Signed)
" ° ° °  CARDIOVERSION NOTE  Procedure: Electrical Cardioversion Indications:  Atrial Flutter  Procedure Details:  Consent: Risks of procedure as well as the alternatives and risks of each were explained to the (patient/caregiver).  Consent for procedure obtained.  Time Out: Verified patient identification, verified procedure, site/side was marked, verified correct patient position, special equipment/implants available, medications/allergies/relevent history reviewed, required imaging and test results available.  Performed  Patient placed on cardiac monitor, pulse oximetry, supplemental oxygen as necessary.  Sedation given: propofol  per anesthesia Pacer pads placed anterior and posterior chest.  Cardioverted 1 time(s).  Cardioverted at 300J biphasic.  Impression: Findings: Post procedure EKG shows: NSR Complications: None Patient did tolerate procedure well.  Plan: Successful DCCV with a single 300J biphasic shock to NSR.  Time Spent Directly with the Patient:  30 minutes   Johnny KYM Maxcy, MD, [facility], FNLA, FACP  [place]  [facility]  Medical Director of the [facility] Diplomate of the American Board of Clinical Lipidology Attending Cardiologist  Direct Dial: [phone]  Fax: [phone]  Website:  [email or web address] Johnny Mathews Johnny Mathews 02/12/2024, 8:40 AM     "

## 2024-02-14 ENCOUNTER — Ambulatory Visit

## 2024-02-15 ENCOUNTER — Institutional Professional Consult (permissible substitution): Admitting: Neurology

## 2024-02-22 ENCOUNTER — Encounter (HOSPITAL_COMMUNITY): Payer: Self-pay

## 2024-02-22 ENCOUNTER — Ambulatory Visit (HOSPITAL_COMMUNITY): Admit: 2024-02-22 | Admitting: Cardiovascular Disease

## 2024-02-22 SURGERY — ATRIAL FIBRILLATION ABLATION
Anesthesia: General

## 2024-02-26 ENCOUNTER — Inpatient Hospital Stay: Admitting: Family Medicine

## 2024-03-16 ENCOUNTER — Ambulatory Visit

## 2024-03-24 ENCOUNTER — Ambulatory Visit: Admitting: "Endocrinology

## 2024-04-16 ENCOUNTER — Ambulatory Visit

## 2024-05-17 ENCOUNTER — Ambulatory Visit

## 2024-06-17 ENCOUNTER — Ambulatory Visit

## 2024-07-18 ENCOUNTER — Ambulatory Visit

## 2024-08-18 ENCOUNTER — Ambulatory Visit

## 2024-09-18 ENCOUNTER — Ambulatory Visit

## 2024-10-19 ENCOUNTER — Ambulatory Visit

## 2024-11-19 ENCOUNTER — Ambulatory Visit
